# Patient Record
Sex: Female | Born: 1950 | Race: White | Hispanic: No | Marital: Married | Smoking: Never smoker
Health system: Southern US, Community
[De-identification: ages and names within clinical notes are randomized; demographics above are authoritative.]

## PROBLEM LIST (undated history)

## (undated) DIAGNOSIS — I340 Nonrheumatic mitral (valve) insufficiency: Secondary | ICD-10-CM

## (undated) DIAGNOSIS — C801 Malignant (primary) neoplasm, unspecified: Secondary | ICD-10-CM

## (undated) DIAGNOSIS — K219 Gastro-esophageal reflux disease without esophagitis: Secondary | ICD-10-CM

## (undated) DIAGNOSIS — I471 Supraventricular tachycardia, unspecified: Secondary | ICD-10-CM

## (undated) DIAGNOSIS — I10 Essential (primary) hypertension: Secondary | ICD-10-CM

## (undated) DIAGNOSIS — R51 Headache: Secondary | ICD-10-CM

## (undated) DIAGNOSIS — I700 Atherosclerosis of aorta: Secondary | ICD-10-CM

## (undated) DIAGNOSIS — Z9889 Other specified postprocedural states: Secondary | ICD-10-CM

## (undated) DIAGNOSIS — I499 Cardiac arrhythmia, unspecified: Secondary | ICD-10-CM

## (undated) DIAGNOSIS — L900 Lichen sclerosus et atrophicus: Secondary | ICD-10-CM

## (undated) DIAGNOSIS — I2510 Atherosclerotic heart disease of native coronary artery without angina pectoris: Secondary | ICD-10-CM

## (undated) DIAGNOSIS — K449 Diaphragmatic hernia without obstruction or gangrene: Secondary | ICD-10-CM

## (undated) DIAGNOSIS — Z803 Family history of malignant neoplasm of breast: Secondary | ICD-10-CM

## (undated) DIAGNOSIS — L570 Actinic keratosis: Secondary | ICD-10-CM

## (undated) DIAGNOSIS — M1990 Unspecified osteoarthritis, unspecified site: Secondary | ICD-10-CM

## (undated) DIAGNOSIS — I5189 Other ill-defined heart diseases: Secondary | ICD-10-CM

## (undated) DIAGNOSIS — R251 Tremor, unspecified: Secondary | ICD-10-CM

## (undated) DIAGNOSIS — R112 Nausea with vomiting, unspecified: Secondary | ICD-10-CM

## (undated) DIAGNOSIS — R519 Headache, unspecified: Secondary | ICD-10-CM

## (undated) DIAGNOSIS — T8859XA Other complications of anesthesia, initial encounter: Secondary | ICD-10-CM

## (undated) DIAGNOSIS — F39 Unspecified mood [affective] disorder: Secondary | ICD-10-CM

## (undated) DIAGNOSIS — T4145XA Adverse effect of unspecified anesthetic, initial encounter: Secondary | ICD-10-CM

## (undated) DIAGNOSIS — I4719 Other supraventricular tachycardia: Secondary | ICD-10-CM

## (undated) DIAGNOSIS — Z86000 Personal history of in-situ neoplasm of breast: Secondary | ICD-10-CM

## (undated) DIAGNOSIS — C50919 Malignant neoplasm of unspecified site of unspecified female breast: Secondary | ICD-10-CM

## (undated) DIAGNOSIS — G250 Essential tremor: Secondary | ICD-10-CM

## (undated) HISTORY — PX: CHOLECYSTECTOMY: SHX55

## (undated) HISTORY — DX: Supraventricular tachycardia: I47.1

## (undated) HISTORY — DX: Nonrheumatic mitral (valve) insufficiency: I34.0

## (undated) HISTORY — PX: APPENDECTOMY: SHX54

## (undated) HISTORY — DX: Family history of malignant neoplasm of breast: Z80.3

## (undated) HISTORY — DX: Atherosclerotic heart disease of native coronary artery without angina pectoris: I25.10

## (undated) HISTORY — DX: Supraventricular tachycardia, unspecified: I47.10

## (undated) HISTORY — DX: Gastro-esophageal reflux disease without esophagitis: K21.9

## (undated) HISTORY — PX: TONSILLECTOMY: SUR1361

## (undated) HISTORY — DX: Diaphragmatic hernia without obstruction or gangrene: K44.9

## (undated) HISTORY — PX: OVARIAN CYST SURGERY: SHX726

## (undated) HISTORY — DX: Actinic keratosis: L57.0

## (undated) HISTORY — DX: Malignant neoplasm of unspecified site of unspecified female breast: C50.919

## (undated) HISTORY — DX: Essential (primary) hypertension: I10

---

## 1998-12-26 HISTORY — PX: BREAST EXCISIONAL BIOPSY: SUR124

## 2001-12-26 DIAGNOSIS — K449 Diaphragmatic hernia without obstruction or gangrene: Secondary | ICD-10-CM

## 2001-12-26 HISTORY — DX: Diaphragmatic hernia without obstruction or gangrene: K44.9

## 2001-12-26 HISTORY — PX: UPPER GI ENDOSCOPY: SHX6162

## 2006-10-23 ENCOUNTER — Ambulatory Visit: Payer: Self-pay | Admitting: Gynecology

## 2006-10-23 ENCOUNTER — Encounter (INDEPENDENT_AMBULATORY_CARE_PROVIDER_SITE_OTHER): Payer: Self-pay | Admitting: Gynecology

## 2006-11-06 ENCOUNTER — Ambulatory Visit: Payer: Self-pay | Admitting: Gynecology

## 2007-01-04 ENCOUNTER — Ambulatory Visit (HOSPITAL_COMMUNITY): Admission: RE | Admit: 2007-01-04 | Discharge: 2007-01-04 | Payer: Self-pay | Admitting: Gynecology

## 2007-01-08 ENCOUNTER — Ambulatory Visit: Payer: Self-pay | Admitting: Gynecology

## 2007-01-16 ENCOUNTER — Encounter: Admission: RE | Admit: 2007-01-16 | Discharge: 2007-01-16 | Payer: Self-pay | Admitting: Gynecology

## 2010-12-06 ENCOUNTER — Ambulatory Visit: Payer: Self-pay | Admitting: Family Medicine

## 2010-12-06 ENCOUNTER — Encounter: Payer: Self-pay | Admitting: Family Medicine

## 2010-12-06 DIAGNOSIS — M25559 Pain in unspecified hip: Secondary | ICD-10-CM

## 2010-12-06 DIAGNOSIS — R252 Cramp and spasm: Secondary | ICD-10-CM

## 2010-12-08 ENCOUNTER — Ambulatory Visit: Payer: Self-pay | Admitting: Family Medicine

## 2010-12-09 ENCOUNTER — Ambulatory Visit: Payer: Self-pay | Admitting: Family Medicine

## 2010-12-09 LAB — CONVERTED CEMR LAB
BUN: 15 mg/dL (ref 6–23)
Cholesterol: 187 mg/dL (ref 0–200)
Creatinine, Ser: 0.8 mg/dL (ref 0.4–1.2)
GFR calc non Af Amer: 82.41 mL/min (ref >60.00)
LDL Cholesterol: 101 mg/dL — ABNORMAL HIGH (ref 0–99)
TSH: 1.98 microintl units/mL (ref 0.35–5.50)
Triglycerides: 43 mg/dL (ref 0.0–149.0)
VLDL: 8.6 mg/dL (ref 0.0–40.0)

## 2010-12-10 ENCOUNTER — Encounter: Payer: Self-pay | Admitting: Family Medicine

## 2010-12-13 ENCOUNTER — Encounter: Payer: Self-pay | Admitting: Family Medicine

## 2010-12-13 ENCOUNTER — Ambulatory Visit: Payer: Self-pay | Admitting: Family Medicine

## 2011-01-27 NOTE — Miscellaneous (Signed)
Summary: BONE DENSITY  Clinical Lists Changes  Orders: Added new Test order of T-Bone Densitometry (77080) - Signed Added new Test order of T-Lumbar Vertebral Assessment (77082) - Signed 

## 2011-01-27 NOTE — Letter (Signed)
Summary: Results Follow up Letter  [place] at [facility]  [address] [place], [place] [address]   Phone: [phone]  Fax: [phone]    12/10/2010 MRN: [id number]  Susan [name] [address] [place], [place]  [address]  Dear [name],  The following are the results of your recent test(s):  Test         Result    Pap Smear:        Normal _____  Not Normal _____ Comments: ______________________________________________________ Cholesterol: LDL(Bad cholesterol):         Your goal is less than:         HDL (Good cholesterol):       Your goal is more than: Comments:  ______________________________________________________ Mammogram:        Normal _____  Not Normal _____ Comments:  ___________________________________________________________________ Hemoccult:        Normal __X___  Not normal _______ Comments:    _____________________________________________________________________ Other Tests:    We routinely do not discuss normal results over the telephone.  If you desire a copy of the results, or you have any questions about this information we can discuss them at your next office visit.   Sincerely,      Dr. Ruthe Mannan

## 2011-01-27 NOTE — Letter (Signed)
Summary: [facility]: Immunoassay Fecal Occult Blood (iFOB) Order Form  [place] at [facility]  [address] [place], [place] [address]   Phone: [phone]  Fax: [phone]      [facility]: Immunoassay Fecal Occult Blood (iFOB) Order Form   December 06, 2010 MRN: [id number]   Krishna Degollado 02-09-1951   Physicican Name:________Talia Aron_________________  Diagnosis Code:_________792.1_________________      Ruthe Mannan MD

## 2011-01-27 NOTE — Assessment & Plan Note (Signed)
Summary: NEW PT TO EST/CLE   Vital Signs:  Patient profile:   60 year old female Height:      68 inches Weight:      143.3 pounds BMI:     21.87 Temp:     97.4 degrees F oral Pulse rate:   76 / minute Pulse rhythm:   regular BP sitting:   110 / 76  (left arm) Cuff size:   regular  Vitals Entered By: Benny Lennert CMA Duncan Dull) (December 06, 2010 10:55 AM)  History of Present Illness: 60 yo here to establish care.  Right hip pain- occurred fist time this past summer, was walking and felt like right hip was "catching." excruciating pain, no radiation, resolved after she rested for a few minutes.  Occurred again two weeks ago but this time was so bad she could not walk.  Again, pain did not radiate.  No GI or GU symptoms. 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  No other known OA. Very physically active, goes to the gym most days of the week. Has not had a bone density scan in years. Does take Caltrate twice daily.  Muscle cramps- occurs mainly at night.  Recently lost 20 pounds on [employer].  Does not each much fruit.  No LE weakness or swelling.  Preventive Screening-Counseling & Management  Alcohol-Tobacco     Smoking Status: never  Caffeine-Diet-Exercise     Does Patient Exercise: yes      Drug Use:  no.    Current Medications (verified): 1)  Liquid Calcium With D3 [phone] Mg-Unit Caps (Calcium Carb-Cholecalciferol) .... 2 Tablet Daily 2)  Vitamin D3 1000 Unit Tabs (Cholecalciferol) .... One Tablet Dialy 3)  Multivitamins  Caps (Multiple Vitamin) .... One Tablet Dialy 4)  Magnesium 500 Mg Tabs (Magnesium) .... 2 Tabs Two Times A Day 5)  Fiber 625 Mg Tabs (Calcium Polycarbophil) .... Two Tabs Two Times A Day 6)  Fish Oil 1200 Mg Caps (Omega-3 Fatty Acids) .... 2 Tabs Once Daily 7)  Xanax 0.5 Mg Tabs (Alprazolam) .Marland Kitchen.. 1 Tablet Daily As Needed  Allergies (verified): No Known Drug Allergies  Past History:  Family History: Last updated: 12/06/2010 Mom- alive, HTN, HDL Dad died  of CVA, HTN  Social History: Last updated: 12/06/2010 Married Regular exercise-yes Never Smoked Alcohol use-yes Drug use-no  Risk Factors: Exercise: yes (12/06/2010)  Risk Factors: Smoking Status: never (12/06/2010)  Past Medical History: G0P0- post menopausal (2000)- [facility] Lichen sclerosis  Past Surgical History: Appendectomy Cholecystectomy  Family History: Mom- alive, HTN, HDL Dad died of CVA, HTN  Social History: Married Regular exercise-yes Never Smoked Alcohol use-yes Drug use-no Does Patient Exercise:  yes Smoking Status:  never Drug Use:  no  Review of Systems      See HPI General:  Denies malaise. Eyes:  Denies blurring. ENT:  Denies difficulty swallowing. CV:  Denies chest pain or discomfort. Resp:  Denies shortness of breath. GI:  Denies abdominal pain, bloody stools, and change in bowel habits. GU:  Denies abnormal vaginal bleeding, discharge, urinary frequency, and urinary hesitancy. MS:  Complains of joint pain; denies joint redness, joint swelling, loss of strength, muscle aches, muscle weakness, and stiffness. Derm:  Denies rash. Neuro:  Denies poor balance. Psych:  Denies anxiety and depression. Endo:  Denies cold intolerance and heat intolerance. Heme:  Denies abnormal bruising and bleeding.  Physical Exam  General:  alert, well-developed, and well-nourished.   Head:  normocephalic and atraumatic.   Eyes:  vision grossly  intact, pupils equal, pupils round, and pupils reactive to light.   Ears:  R ear normal and L ear normal.   Nose:  no external deformity.   Mouth:  good dentition.   Neck:  No deformities, masses, or tenderness noted. Lungs:  Normal respiratory effort, chest expands symmetrically. Lungs are clear to auscultation, no crackles or wheezes. Heart:  Normal rate and regular rhythm. S1 and S2 normal without gallop, murmur, click, rub or other extra sounds. Abdomen:  Bowel sounds positive,abdomen soft and non-tender  without masses, organomegaly or hernias noted. Extremities:  no edema Neurologic:  alert & oriented X3 and gait normal.   Skin:  Intact without suspicious lesions or rashes Cervical Nodes:  No lymphadenopathy noted Psych:  Oriented X3 and good eye contact.     Hip Exam  Hip Exam:    Right:    Inspection:  Normal    Palpation:  Normal    Stability:  stable    Tenderness:  no    Swelling:  no    Erythema:  no    Range of Motion:       Flexion-Active: 120       Extension-Active: 30       Internal Rotation-Active: 45       External Rotation-Active: 45       Flexion-Passive: 120       Extension-Passive: 30       Internal Rotation-Passive: 45       External Rotation: 45    Left:    Inspection:  Normal    Palpation:  Normal    Stability:  stable    Tenderness:  no    Swelling:  no    Erythema:  no    Range of Motion:       Flexion-Active: 120       Extension-Active: 30       Internal Rotation-Active: 45       External Rotation-Active: 45       Flexion-Passive: 120       Extension-Passive: 30       Internal Rotation-Passive: 45       External Rotation: 45   Impression & Recommendations:  Problem # 1:  HIP PAIN, RIGHT (ICD-719.45) Assessment New ? OA.  Will get xray to view alignment and joint space. Orders: T-Hip Comp Right Min 2 views (73510TC)  Problem # 2:  R/O OSTEOPENIA (ICD-733.90) Assessment: New Given #1 along with her age and risk factors, DEXA scan is appropriate. Her updated medication list for this problem includes:    Liquid Calcium With D3 [phone] Mg-unit Caps (Calcium carb-cholecalciferol) .Marland Kitchen... 2 tablet daily    Vitamin D3 1000 Unit Tabs (Cholecalciferol) ..... One tablet dialy  Orders: Radiology Referral (Radiology)  Problem # 3:  MUSCLE CRAMPS (ICD-729.82) Assessment: New Concern that she likely has electrolyte imbalances due to her diet. Check BMET.  Complete Medication List: 1)  Liquid Calcium With D3 [phone] Mg-unit Caps (Calcium  carb-cholecalciferol) .... 2 tablet daily 2)  Vitamin D3 1000 Unit Tabs (Cholecalciferol) .... One tablet dialy 3)  Multivitamins Caps (Multiple vitamin) .... One tablet dialy 4)  Magnesium 500 Mg Tabs (Magnesium) .... 2 tabs two times a day 5)  Fiber 625 Mg Tabs (Calcium polycarbophil) .... Two tabs two times a day 6)  Fish Oil 1200 Mg Caps (Omega-3 fatty acids) .... 2 tabs once daily 7)  Xanax 0.5 Mg Tabs (Alprazolam) .Marland Kitchen.. 1 tablet daily as needed  Patient Instructions: 1)  Great to  meet you. 2)  Please stop by to see Shirlee Limerick on your way out to set up your Bone Density scan. 3)  At your convenience, come back for lab work: 4)  BMET (v70.0), lipid panel (v81.0), TSH    Orders Added: 1)  T-Hip Comp Right Min 2 views [73510TC] 2)  Radiology Referral [Radiology] 3)  New Patient Level IV [[address]]    Current Allergies (reviewed today): No known allergies   TD Result Date:  12/03/2008 TD Result:  historical Herpes Zoster Next Due:  Refused Colonoscopy Result Date:  12/19/2001 Colonoscopy Result:  historical, normal Colonoscopy Next Due:  10 yr PAP Result Date:  10/09/2009 PAP Result:  historical, normal PAP Next Due:  3 yr Mammogram Result Date:  01/07/2010 Mammogram Result:  historical    Past Medical History:    G0P0- post menopausal (2000)- Shirlyn Goltz    Lichen sclerosis  Past Surgical History:    Appendectomy    Cholecystectomy

## 2011-02-16 ENCOUNTER — Encounter: Payer: Self-pay | Admitting: Family Medicine

## 2011-02-16 ENCOUNTER — Ambulatory Visit (INDEPENDENT_AMBULATORY_CARE_PROVIDER_SITE_OTHER): Payer: BC Managed Care – PPO | Admitting: Family Medicine

## 2011-02-16 DIAGNOSIS — M948X9 Other specified disorders of cartilage, unspecified sites: Secondary | ICD-10-CM

## 2011-02-22 NOTE — Assessment & Plan Note (Signed)
Summary: UPPER RIGHT BACK PAIN/CLE  BLUE CROSS BLUE OPTIONS   Vital Signs:  Patient profile:   60 year old Susan [name] Height:      68 inches Weight:      142.50 pounds BMI:     21.75 Temp:     98.7 degrees F oral Pulse rate:   68 / minute Pulse rhythm:   regular BP sitting:   120 / 86  (left arm) Cuff size:   regular  Vitals Entered By: Linde Gillis CMA (AAMA) (February 16, 2011 8:00 AM) CC: back pain   History of Present Illness: 60 yo Susan [name] here with acute onset of right upper back pain.  Woke up Sunday morning with a soreness of her right scapular region. Thought it was GERD, took Pepcid and Gas X which did not help. Later that day, pain worsened with sudden movements- became sharp if she turned a certain way. No pain if she just stood still. Pain can be reproduced by lifting arms over her head.  No SOB, CP, diaphoresis or nausea.  Pt does exercise at the gym but has not tried any new machines or movements.  Does have h/o osteopenia on DEXA from 11/2010. Taking Calcium and Vit D.  Non smoker.  Current Medications (verified): 1)  Liquid Calcium With D3 [phone] Mg-Unit Caps (Calcium Carb-Cholecalciferol) .... 2 Tablet Daily 2)  Vitamin D3 1000 Unit Tabs (Cholecalciferol) .... One Tablet Dialy 3)  Multivitamins  Caps (Multiple Vitamin) .... One Tablet Dialy 4)  Magnesium 500 Mg Tabs (Magnesium) .... 2 Tabs Two Times A Day 5)  Fiber 625 Mg Tabs (Calcium Polycarbophil) .... Two Tabs Two Times A Day 6)  Fish Oil 1200 Mg Caps (Omega-3 Fatty Acids) .... 2 Tabs Once Daily 7)  Xanax 0.5 Mg Tabs (Alprazolam) .Marland Kitchen.. 1 Tablet Daily As Needed 8)  [facility]vs Acid Reducer 10 Mg Tabs (Famotidine) .... Take As Directed 9)  Gas Relief 80 Mg Chew (Simethicone) .... Take As Directed 10)  Cyclobenzaprine Hcl 10 Mg  Tabs (Cyclobenzaprine Hcl) .Marland Kitchen.. 1 By Mouth 2 Times Daily As Needed For Back Pain  Allergies (verified): No Known Drug Allergies  Past History:  Past Medical History: Last updated:  12/06/2010 G0P0- post menopausal (2000)- [facility] Lichen sclerosis  Past Surgical History: Last updated: 12/06/2010 Appendectomy Cholecystectomy  Family History: Last updated: 12/06/2010 Mom- alive, HTN, HDL Dad died of CVA, HTN  Social History: Last updated: 12/06/2010 Married Regular exercise-yes Never Smoked Alcohol use-yes Drug use-no  Risk Factors: Exercise: yes (12/06/2010)  Risk Factors: Smoking Status: never (12/06/2010)  Review of Systems      See HPI General:  Denies fever. GI:  Denies abdominal pain, nausea, and vomiting. MS:  Denies muscle weakness. Derm:  Denies rash. Neuro:  Denies headaches. Psych:  Denies anxiety and depression.  Physical Exam  General:  alert, well-developed, and well-nourished.   Lungs:  Normal respiratory effort, chest expands symmetrically. Lungs are clear to auscultation, no crackles or wheezes. Heart:  Normal rate and regular rhythm. S1 and S2 normal without gallop, murmur, click, rub or other extra sounds. Msk:  mildy TTP over C4 Extremities:  no edema Neurologic:  alert & oriented X3 and gait normal.   Psych:  Oriented X3 and good eye contact.     Shoulder/Elbow Exam  Shoulder Exam:    Right:    Inspection:  Normal    Palpation:  Normal    Stability:  stable    Tenderness:  no    Swelling:  no    Erythema:  no    pos empty can, pos arch    Range of Motion:       Flexion-Active: 180       Extension-Active: 45       Flexion-Passive: 180       Extension-Passive: 45       External Rotation : 45       Interior Rotation : T7    Left:    Inspection:  Normal    Palpation:  Normal    Stability:  stable    Tenderness:  no    Swelling:  no    Erythema:  no    Range of Motion:       Flexion-Active: 180       Extension-Active: 45       Flexion-Passive: 180       Extension-Passive: 45       External Rotation : 45       Interior Rotation : T7   Impression & Recommendations:  Problem # 1:  OTHER  DISORDERS OF BONE AND CARTILAGE OTHER (ICD-733.99) Assessment New Differential is wide but given history and physical exam findings, seems most consistent with nerve impingement. Pt has h/o osteopenia and some tenderness of Cspine so I question possible vertebral fracture causing nerve impingement. She reports pain is somewhat improved. has a high deductible and does not want further work up at this time. I would like to do an xray to rule out vertebral fracture and any lung pathology (unlikely) causing referred pain. If pain persists, pt will call to set up an xray.  Her updated medication list for this problem includes:    Liquid Calcium With D3 [phone] Mg-unit Caps (Calcium carb-cholecalciferol) .Marland Kitchen... 2 tablet daily    Vitamin D3 1000 Unit Tabs (Cholecalciferol) ..... One tablet dialy  Complete Medication List: 1)  Liquid Calcium With D3 [phone] Mg-unit Caps (Calcium carb-cholecalciferol) .... 2 tablet daily 2)  Vitamin D3 1000 Unit Tabs (Cholecalciferol) .... One tablet dialy 3)  Multivitamins Caps (Multiple vitamin) .... One tablet dialy 4)  Magnesium 500 Mg Tabs (Magnesium) .... 2 tabs two times a day 5)  Fiber 625 Mg Tabs (Calcium polycarbophil) .... Two tabs two times a day 6)  Fish Oil 1200 Mg Caps (Omega-3 fatty acids) .... 2 tabs once daily 7)  Xanax 0.5 Mg Tabs (Alprazolam) .Marland Kitchen.. 1 tablet daily as needed 8)  Cvs Acid Reducer 10 Mg Tabs (Famotidine) .... Take as directed 9)  Gas Relief 80 Mg Chew (Simethicone) .... Take as directed 10)  Cyclobenzaprine Hcl 10 Mg Tabs (Cyclobenzaprine hcl) .Marland Kitchen.. 1 by mouth 2 times daily as needed for back pain  Patient Instructions: 1)  lets increase your vitamin d to 2000 International Units daily, continue with your calcium. 2)  call me in a few days to let me know how you're feeling. Prescriptions: CYCLOBENZAPRINE HCL 10 MG  TABS (CYCLOBENZAPRINE HCL) 1 by mouth 2 times daily as needed for back pain  #30 x 0   Entered and Authorized by:    Ruthe Mannan MD   Signed by:   Ruthe Mannan MD on 02/16/2011   Method used:   Electronically to        [id number] (retail)       [address] [place], [place]  [address]       Ph: [phone]  Fax: [phone]   RxID:   [id number]    Orders Added: 1)  Est. Patient Level IV [46962]    Current Allergies (reviewed today): No known allergies

## 2012-02-16 ENCOUNTER — Other Ambulatory Visit: Payer: Self-pay | Admitting: Obstetrics and Gynecology

## 2012-02-16 DIAGNOSIS — Z1231 Encounter for screening mammogram for malignant neoplasm of breast: Secondary | ICD-10-CM

## 2012-02-27 ENCOUNTER — Ambulatory Visit
Admission: RE | Admit: 2012-02-27 | Discharge: 2012-02-27 | Disposition: A | Discharge status: Home / Self Care | Payer: BC Managed Care – PPO | Source: Ambulatory Visit | Attending: Obstetrics and Gynecology | Admitting: Obstetrics and Gynecology

## 2012-02-27 DIAGNOSIS — Z1231 Encounter for screening mammogram for malignant neoplasm of breast: Secondary | ICD-10-CM

## 2013-03-21 ENCOUNTER — Other Ambulatory Visit: Payer: Self-pay | Admitting: Family Medicine

## 2013-03-21 DIAGNOSIS — Z0000 Encounter for general adult medical examination without abnormal findings: Secondary | ICD-10-CM | POA: Insufficient documentation

## 2013-03-21 DIAGNOSIS — Z136 Encounter for screening for cardiovascular disorders: Secondary | ICD-10-CM

## 2013-03-26 ENCOUNTER — Other Ambulatory Visit (INDEPENDENT_AMBULATORY_CARE_PROVIDER_SITE_OTHER): Payer: BC Managed Care – PPO

## 2013-03-26 DIAGNOSIS — Z136 Encounter for screening for cardiovascular disorders: Secondary | ICD-10-CM

## 2013-03-26 DIAGNOSIS — Z0000 Encounter for general adult medical examination without abnormal findings: Secondary | ICD-10-CM

## 2013-03-26 LAB — LIPID PANEL
Total CHOL/HDL Ratio: 2
Triglycerides: 48 mg/dL (ref 0.0–149.0)

## 2013-03-26 LAB — COMPREHENSIVE METABOLIC PANEL
AST: 20 U/L (ref 0–37)
Albumin: 4.3 g/dL (ref 3.5–5.2)
BUN: 17 mg/dL (ref 6–23)
Calcium: 9.3 mg/dL (ref 8.4–10.5)
Chloride: 101 mEq/L (ref 96–112)
Potassium: 4.1 mEq/L (ref 3.5–5.1)
Total Protein: 6.9 g/dL (ref 6.0–8.3)

## 2013-04-02 ENCOUNTER — Ambulatory Visit (INDEPENDENT_AMBULATORY_CARE_PROVIDER_SITE_OTHER): Payer: BC Managed Care – PPO | Admitting: Family Medicine

## 2013-04-02 ENCOUNTER — Encounter: Payer: Self-pay | Admitting: Family Medicine

## 2013-04-02 VITALS — BP 130/98 | HR 68 | Temp 97.5°F | Ht 68.0 in | Wt 147.0 lb

## 2013-04-02 DIAGNOSIS — Z1211 Encounter for screening for malignant neoplasm of colon: Secondary | ICD-10-CM

## 2013-04-02 DIAGNOSIS — Z0000 Encounter for general adult medical examination without abnormal findings: Secondary | ICD-10-CM

## 2013-04-02 NOTE — Progress Notes (Signed)
Subjective:    Patient ID: Susan [name], female    DOB: 1951-12-09, 62 y.o.   MRN: [id number]  HPI  G0P0 who has not been seen here in several years, here for CPX. She has been doing well.  Remains quite active and has no complaints.  [facility]  Mammogram normal last month.  Has never had a colonoscopy.  No family history of colon CA.  Lab Results  Component Value Date   CHOL 173 03/26/2013   HDL 70.20 03/26/2013   LDLCALC 93 03/26/2013   TRIG 48.0 03/26/2013   CHOLHDL 2 03/26/2013   Lab Results  Component Value Date   NA 137 03/26/2013   K 4.1 03/26/2013   CL 101 03/26/2013   CO2 27 03/26/2013   Patient Active Problem List  Diagnosis  . HIP PAIN, RIGHT  . MUSCLE CRAMPS  . OTHER DISORDERS OF BONE AND CARTILAGE OTHER  . Routine general medical examination at a health care facility   No past medical history on file. Past Surgical History  Procedure Laterality Date  . Appendectomy    . Cholecystectomy     History  Substance Use Topics  . Smoking status: Not on file  . Smokeless tobacco: Not on file  . Alcohol Use: Not on file   Family History  Problem Relation Age of Onset  . Hyperlipidemia Mother   . Hypertension Mother   . Stroke Father   . Hypertension Father    Allergies not on file No current outpatient prescriptions on file prior to visit.   No current facility-administered medications on file prior to visit.   The PMH, PSH, Social History, Family History, Medications, and allergies have been reviewed in [facility], and have been updated if relevant.   Review of Systems   See HPI Patient reports no  vision/ hearing changes,anorexia, weight change, fever ,adenopathy, persistant / recurrent hoarseness, swallowing issues, chest pain, edema,persistant / recurrent cough, hemoptysis, dyspnea(rest, exertional, paroxysmal nocturnal), gastrointestinal  bleeding (melena, rectal bleeding), abdominal pain, excessive heart burn, GU symptoms(dysuria, hematuria,  pyuria, voiding/incontinence  Issues) syncope, focal weakness, severe memory loss, concerning skin lesions, depression, anxiety, abnormal bruising/bleeding, major joint swelling, breast masses or abnormal vaginal bleeding.    Objective:   Physical Exam BP 130/98  Pulse 68  Temp(Src) 97.5 F (36.4 C)  Ht 5\' 8"  (1.727 m)  Wt 147 lb (66.679 kg)  BMI 22.36 kg/m2  General:  Well-developed,well-nourished,in no acute distress; alert,appropriate and cooperative throughout examination Head:  normocephalic and atraumatic.   Eyes:  vision grossly intact, pupils equal, pupils round, and pupils reactive to light.   Ears:  R ear normal and L ear normal.   Nose:  no external deformity.   Mouth:  good dentition.   Neck:  No deformities, masses, or tenderness noted. Lungs:  Normal respiratory effort, chest expands symmetrically. Lungs are clear to auscultation, no crackles or wheezes. Heart:  Normal rate and regular rhythm. S1 and S2 normal without gallop, murmur, click, rub or other extra sounds. Abdomen:  Bowel sounds positive,abdomen soft and non-tender without masses, organomegaly or hernias noted. Rectal:  no external abnormalities.   Msk:  No deformity or scoliosis noted of thoracic or lumbar spine.   Extremities:  No clubbing, cyanosis, edema, or deformity noted with normal full range of motion of all joints.   Neurologic:  alert & oriented X3 and gait normal.   Skin:  Intact without suspicious lesions or rashes Cervical Nodes:  No lymphadenopathy noted Axillary Nodes:  No palpable lymphadenopathy Psych:  Cognition and judgment appear intact. Alert and cooperative with normal attention span and concentration. No apparent delusions, illusions, hallucinations      Assessment & Plan:  1. Routine general medical examination at a health care facility Reviewed preventive care protocols, scheduled due services, and updated immunizations Discussed nutrition, exercise, diet, and healthy  lifestyle. She will call insurance about shingles vaccine coverage.  2. Special screening for malignant neoplasms, colon She would like to call insurance about colonoscopy.  IFOB ordered in interim. - Fecal occult blood, imunochemical; Future

## 2013-04-02 NOTE — Patient Instructions (Addendum)
Good to see you. Have a wonderful trip this summer.  Please call [employer] [facility] to ask about your colonoscopy about the shingles vaccine.  Please stop by the lab to pick up your stool cards.

## 2013-04-12 ENCOUNTER — Other Ambulatory Visit (INDEPENDENT_AMBULATORY_CARE_PROVIDER_SITE_OTHER): Payer: BC Managed Care – PPO

## 2013-04-12 DIAGNOSIS — Z1211 Encounter for screening for malignant neoplasm of colon: Secondary | ICD-10-CM

## 2013-04-12 LAB — FECAL OCCULT BLOOD, IMMUNOCHEMICAL: Fecal Occult Bld: POSITIVE

## 2013-04-15 ENCOUNTER — Encounter: Payer: Self-pay | Admitting: *Deleted

## 2013-04-15 ENCOUNTER — Encounter: Payer: Self-pay | Admitting: Family Medicine

## 2013-04-15 LAB — FECAL OCCULT BLOOD, GUAIAC: Fecal Occult Blood: NEGATIVE

## 2013-09-26 ENCOUNTER — Telehealth: Payer: Self-pay | Admitting: *Deleted

## 2013-09-26 NOTE — Telephone Encounter (Signed)
She can try dry mouth tooth pastes and products like biotene and if does not improve, needs to be seen to rule out other possible contributing factors.

## 2013-09-26 NOTE — Telephone Encounter (Signed)
Pt states she has already tried these products for dry mouth.  Appt scheduled for next week.

## 2013-09-26 NOTE — Telephone Encounter (Signed)
Pt left VM at triage stating she has been bother with dry mouth for 4 weeks now and would like to know what to do. Please advise

## 2013-10-02 ENCOUNTER — Ambulatory Visit (INDEPENDENT_AMBULATORY_CARE_PROVIDER_SITE_OTHER): Payer: BC Managed Care – PPO | Admitting: Family Medicine

## 2013-10-02 ENCOUNTER — Encounter: Payer: Self-pay | Admitting: Family Medicine

## 2013-10-02 VITALS — BP 122/88 | HR 72 | Temp 97.9°F | Wt 143.2 lb

## 2013-10-02 DIAGNOSIS — Z23 Encounter for immunization: Secondary | ICD-10-CM

## 2013-10-02 DIAGNOSIS — R682 Dry mouth, unspecified: Secondary | ICD-10-CM

## 2013-10-02 DIAGNOSIS — K117 Disturbances of salivary secretion: Secondary | ICD-10-CM

## 2013-10-02 LAB — BASIC METABOLIC PANEL
CO2: 28 mEq/L (ref 19–32)
Calcium: 9.9 mg/dL (ref 8.4–10.5)
Chloride: 103 mEq/L (ref 96–112)
Creatinine, Ser: 0.8 mg/dL (ref 0.4–1.2)
GFR: 81.65 mL/min (ref >60.00)
Glucose, Bld: 95 mg/dL (ref 70–99)
Sodium: 141 mEq/L (ref 135–145)

## 2013-10-02 LAB — CBC WITH DIFFERENTIAL/PLATELET
Basophils Absolute: 0.1 10*3/uL (ref 0.0–0.1)
Eosinophils Relative: 1.4 % (ref 0.0–5.0)
HCT: 44.2 % (ref 36.0–46.0)
Lymphocytes Relative: 18.4 % (ref 12.0–46.0)
Lymphs Abs: 1.2 10*3/uL (ref 0.7–4.0)
Monocytes Relative: 7.9 % (ref 3.0–12.0)
Neutrophils Relative %: 71.4 % (ref 43.0–77.0)
Platelets: 281 10*3/uL (ref 150.0–400.0)
RDW: 13.4 % (ref 11.5–14.6)
WBC: 6.8 10*3/uL (ref 4.5–10.5)

## 2013-10-02 LAB — HEMOGLOBIN A1C: Hgb A1c MFr Bld: 5.7 % (ref 4.6–6.5)

## 2013-10-02 LAB — TSH: TSH: 1.65 u[IU]/mL (ref 0.35–5.50)

## 2013-10-02 NOTE — Addendum Note (Signed)
Addended by: Criselda Peaches B on: 10/02/2013 09:51 AM   Modules accepted: Orders

## 2013-10-02 NOTE — Patient Instructions (Signed)
Good to see you. We will call you with your lab results, likely toward the end of the week.

## 2013-10-02 NOTE — Progress Notes (Signed)
Subjective:    Patient ID: Susan [name], female    DOB: 11-28-1951, 62 y.o.   MRN: [id number]  HPI  Very pleasant 62 yo female here for dry mouth.  Started over a month ago.  Noticed she would wake up in the middle of the night with a dry "film" in her mouth.  She thought this was strange since she does not sleep with her mouth open.  It has since progressed, still waking her up in the middle of the night.  She also has noticed that she needs liquids to swallow dry foods and typically had no issues with this.  No issues with dry eyes.  Dad had Diabetes insipidus but she does not feel like she is more thirsty.  She drinks more just to "coat her mouth."  She tried to cut back on certain things to see if it would help- eliminated wine and fiber supplements.  Trying biotene products as well. Only very mild improvement.  Patient Active Problem List   Diagnosis Date Noted  . Dry mouth 10/02/2013  . Routine general medical examination at a health care facility 03/21/2013  . OTHER DISORDERS OF BONE AND CARTILAGE OTHER 02/16/2011  . HIP PAIN, RIGHT 12/06/2010  . MUSCLE CRAMPS 12/06/2010   No past medical history on file. Past Surgical History  Procedure Laterality Date  . Appendectomy    . Cholecystectomy     History  Substance Use Topics  . Smoking status: Never Smoker   . Smokeless tobacco: Not on file  . Alcohol Use: Not on file   Family History  Problem Relation Age of Onset  . Hyperlipidemia Mother   . Hypertension Mother   . Stroke Father   . Hypertension Father    No Known Allergies Current Outpatient Prescriptions on File Prior to Visit  Medication Sig Dispense Refill  . ALPRAZolam (XANAX) 0.5 MG tablet Take 0.5 mg by mouth at bedtime as needed for sleep.      . Clobetasol Prop Oint-Coal Tar (CLOBETA OINTMENT EX) Apply topically twice a week      . conjugated estrogens (PREMARIN) vaginal cream Place vaginally daily. Two times a week       No current  facility-administered medications on file prior to visit.   The PMH, PSH, Social History, Family History, Medications, and allergies have been reviewed in [facility], and have been updated if relevant.   Review of Systems See HPI No changes in bowel habits other than some constipation which she thinks happened because she stopped her fiber supplements Weight stable    Objective:   Physical Exam BP 122/88  Pulse 72  Temp(Src) 97.9 F (36.6 C) (Oral)  Wt 143 lb 4 oz (64.978 kg)  BMI 21.79 kg/m2  SpO2 96% Wt Readings from Last 3 Encounters:  10/02/13 143 lb 4 oz (64.978 kg)  04/02/13 147 lb (66.679 kg)  02/16/11 142 lb 8 oz (64.638 kg)   General:  Well-developed,well-nourished,in no acute distress; alert,appropriate and cooperative throughout examination Head:  normocephalic and atraumatic.   Eyes:  vision grossly intact, pupils equal, pupils round, and pupils reactive to light.   Mouth:  good dentition.   Neck:  No deformities, masses, or tenderness noted. Msk:  No deformity or scoliosis noted of thoracic or lumbar spine.   Extremities:  No clubbing, cyanosis, edema, or deformity noted with normal full range of motion of all joints.   Neurologic:  alert & oriented X3 and gait normal.   Skin:  Intact without suspicious  lesions or rashes Psych:  Cognition and judgment appear intact. Alert and cooperative with normal attention span and concentration. No apparent delusions, illusions, hallucinations     Assessment & Plan:  1. Dry mouth ? Possible Sjorgrens syndrome.  Also would like to rule out thyroid, blood sugar, electrolyte and other possible causes. Check CBC as well. The patient indicates understanding of these issues and agrees with the plan.  - ANA - Sjogrens syndrome-A extractable nuclear antibody - TSH - CBC with Differential - Magnesium - Basic Metabolic Panel - Hemoglobin A1c

## 2013-10-03 ENCOUNTER — Other Ambulatory Visit: Payer: Self-pay | Admitting: Family Medicine

## 2013-10-03 DIAGNOSIS — E875 Hyperkalemia: Secondary | ICD-10-CM

## 2013-10-03 LAB — SJOGRENS SYNDROME-A EXTRACTABLE NUCLEAR ANTIBODY: SSA (Ro) (ENA) Antibody, IgG: 2 AU/mL (ref <30)

## 2013-10-07 ENCOUNTER — Other Ambulatory Visit (INDEPENDENT_AMBULATORY_CARE_PROVIDER_SITE_OTHER): Payer: BC Managed Care – PPO

## 2013-10-07 DIAGNOSIS — E875 Hyperkalemia: Secondary | ICD-10-CM

## 2013-10-07 LAB — BASIC METABOLIC PANEL
BUN: 16 mg/dL (ref 6–23)
CO2: 25 mEq/L (ref 19–32)
Chloride: 101 mEq/L (ref 96–112)
GFR: 81.64 mL/min (ref >60.00)
Glucose, Bld: 106 mg/dL — ABNORMAL HIGH (ref 70–99)
Potassium: 4.5 mEq/L (ref 3.5–5.1)
Sodium: 137 mEq/L (ref 135–145)

## 2014-01-27 ENCOUNTER — Ambulatory Visit (INDEPENDENT_AMBULATORY_CARE_PROVIDER_SITE_OTHER): Payer: BC Managed Care – PPO | Admitting: Family Medicine

## 2014-01-27 ENCOUNTER — Encounter: Payer: Self-pay | Admitting: Family Medicine

## 2014-01-27 VITALS — BP 122/76 | HR 60 | Temp 97.7°F | Ht 67.75 in | Wt 149.0 lb

## 2014-01-27 DIAGNOSIS — M67919 Unspecified disorder of synovium and tendon, unspecified shoulder: Secondary | ICD-10-CM

## 2014-01-27 DIAGNOSIS — M7542 Impingement syndrome of left shoulder: Secondary | ICD-10-CM

## 2014-01-27 DIAGNOSIS — M719 Bursopathy, unspecified: Secondary | ICD-10-CM

## 2014-01-27 MED ORDER — PREDNISONE 10 MG PO TABS: ORAL_TABLET | ORAL | Status: DC

## 2014-01-27 NOTE — Progress Notes (Signed)
   Subjective:    Patient ID: Susan [name], female    DOB: 1951/02/22, 63 y.o.   MRN: [id number]  HPI  Very pleasant 63 yo female with several (2-3 weeks) of left arm pain/tingling.  No known injury although she did think she initially felt these symptoms in October after her flu shot but it resolved.  Feels like a tooth ache and tingling radiates down into her fingers (she cannot remember if all are affected).  She does have h/o DDD in her neck.  No other known injury.  No UE weakness.  Ibuprofen was helping but no longer taking away the pain- just dulling it.  Patient Active Problem List   Diagnosis Date Noted  . Dry mouth 10/02/2013  . Routine general medical examination at a health care facility 03/21/2013  . OTHER DISORDERS OF BONE AND CARTILAGE OTHER 02/16/2011  . HIP PAIN, RIGHT 12/06/2010  . MUSCLE CRAMPS 12/06/2010   No past medical history on file. Past Surgical History  Procedure Laterality Date  . Appendectomy    . Cholecystectomy     History  Substance Use Topics  . Smoking status: Never Smoker   . Smokeless tobacco: Not on file  . Alcohol Use: Not on file   Family History  Problem Relation Age of Onset  . Hyperlipidemia Mother   . Hypertension Mother   . Stroke Father   . Hypertension Father    No Known Allergies Current Outpatient Prescriptions on File Prior to Visit  Medication Sig Dispense Refill  . ALPRAZolam (XANAX) 0.5 MG tablet Take 0.5 mg by mouth at bedtime as needed for sleep.      . Calcium Carbonate-Vitamin D (CALCIUM-VITAMIN D) 500-200 MG-UNIT per tablet Take 1 tablet by mouth 2 (two) times daily with a meal.       . Clobetasol Prop Oint-Coal Tar (CLOBETA OINTMENT EX) Apply topically twice a week      . conjugated estrogens (PREMARIN) vaginal cream Place vaginally daily. Two times a week      . magnesium gluconate (MAGONATE) 500 MG tablet Take 500 mg by mouth daily.        No current facility-administered medications on file prior to  visit.   The PMH, PSH, Social History, Family History, Medications, and allergies have been reviewed in [facility], and have been updated if relevant.   Review of Systems    See HPI  Objective:   Physical Exam BP 122/76  Pulse 60  Temp(Src) 97.7 F (36.5 C) (Oral)  Ht 5' 7.75" (1.721 m)  Wt 149 lb (67.586 kg)  BMI 22.82 kg/m2  SpO2 97% Gen:  Alert, pleasant, NAD MSK: Left arm/shoulder- No tenderness to palpation, normal to inspection +arch sign, +empty can test Normal grip strength and sensation bilaterally       Assessment & Plan:

## 2014-01-27 NOTE — Progress Notes (Signed)
Pre-visit discussion using our clinic review tool. No additional management support is needed unless otherwise documented below in the visit note.  

## 2014-01-27 NOTE — Assessment & Plan Note (Signed)
New- will treat with steroid burst, tylenol. Exercises given in sports med advisor hand out. See AVS for details. Call or return to clinic prn if these symptoms worsen or fail to improve as anticipated. The patient indicates understanding of these issues and agrees with the plan.

## 2014-01-27 NOTE — Patient Instructions (Signed)
You have rotator cuff impingement Try to avoid painful activities (overhead activities, lifting with extended arm) as much as possible. Take Prednisone as directed- in the morning and with food. Can take tylenol in addition to this. Exercises in hand out given.  If no improvement, cortisone injection may be beneficial to help with pain and to decrease inflammation.  If not improving at follow-up we will consider imaging and or physical therapy.

## 2014-04-02 DIAGNOSIS — I10 Essential (primary) hypertension: Secondary | ICD-10-CM | POA: Insufficient documentation

## 2014-05-26 ENCOUNTER — Encounter: Payer: Self-pay | Admitting: Family Medicine

## 2014-05-26 ENCOUNTER — Ambulatory Visit (INDEPENDENT_AMBULATORY_CARE_PROVIDER_SITE_OTHER): Payer: BC Managed Care – PPO | Admitting: Family Medicine

## 2014-05-26 VITALS — BP 118/70 | HR 82 | Temp 97.7°F | Ht 67.75 in | Wt 144.8 lb

## 2014-05-26 DIAGNOSIS — F4323 Adjustment disorder with mixed anxiety and depressed mood: Secondary | ICD-10-CM

## 2014-05-26 MED ORDER — ALPRAZOLAM 0.5 MG PO TABS: 0.5000 mg | ORAL_TABLET | Freq: Every evening | ORAL | Status: DC | PRN

## 2014-05-26 MED ORDER — SERTRALINE HCL 25 MG PO TABS: 25.0000 mg | ORAL_TABLET | Freq: Every day | ORAL | Status: DC

## 2014-05-26 NOTE — Patient Instructions (Signed)
We are starting Zoloft 25 mg daily. Continue xanax at bedtime.  Call me in a few weeks with an update.

## 2014-05-26 NOTE — Progress Notes (Signed)
   Subjective:   Patient ID: Susan [name], female    DOB: 10/26/1951, 63 y.o.   MRN: [id number]  Susan [name] is a pleasant 63 y.o. year old female who presents to clinic today with Anxiety  on 05/26/2014  HPI:  Under increased stressors for past several months. A lot going on. Husband is an alcoholic.  He's 23 years older than her- he bought a condo at [facility] that she didn't want to get.  She does not enjoy going there as much as he does. Feels he did not respect her wishes and he drinks more when he is out there.  He does not think he has a problem.  Also her friend just got diagnosed with cancer.  Feels more tearful.  Not sleeping well- does take xanax 0.25-0.5 mg qhs prn insomnia.  Appetite ok. Wt Readings from Last 3 Encounters:  05/26/14 144 lb 12 oz (65.658 kg)  01/27/14 149 lb (67.586 kg)  10/02/13 143 lb 4 oz (64.978 kg)   Denies SI or HI.  Still enjoys gardening and seeing her friends.  Current Outpatient Prescriptions on File Prior to Visit  Medication Sig Dispense Refill  . Calcium Carbonate-Vitamin D (CALCIUM-VITAMIN D) 500-200 MG-UNIT per tablet Take 1 tablet by mouth 2 (two) times daily with a meal.       . Clobetasol Prop Oint-Coal Tar (CLOBETA OINTMENT EX) Apply topically twice a week      . conjugated estrogens (PREMARIN) vaginal cream Place vaginally daily. Two times a week      . magnesium gluconate (MAGONATE) 500 MG tablet Take 250 mg by mouth daily.        No current facility-administered medications on file prior to visit.    No Known Allergies  No past medical history on file.  Past Surgical History  Procedure Laterality Date  . Appendectomy    . Cholecystectomy      Family History  Problem Relation Age of Onset  . Hyperlipidemia Mother   . Hypertension Mother   . Stroke Father   . Hypertension Father     History   Social History  . Marital Status: Single    Spouse Name: N/A    Number of Children: N/A  . Years of  Education: N/A   Occupational History  . Not on file.   Social History Main Topics  . Smoking status: Never Smoker   . Smokeless tobacco: Not on file  . Alcohol Use: Not on file  . Drug Use: Not on file  . Sexual Activity: Not on file   Other Topics Concern  . Not on file   Social History Narrative  . No narrative on file   The PMH, PSH, Social History, Family History, Medications, and allergies have been reviewed in [facility], and have been updated if relevant.     Review of Systems    See HPI Objective:    BP 118/70  Pulse 82  Temp(Src) 97.7 F (36.5 C) (Oral)  Ht 5' 7.75" (1.721 m)  Wt 144 lb 12 oz (65.658 kg)  BMI 22.17 kg/m2  SpO2 97%   Physical Exam   Gen:  Alert, pleasant, NAD Psych:  Good eye contact, tearful but appropriate.     Assessment & Plan:   Adjustment disorder with mixed anxiety and depressed mood No Follow-up on file.

## 2014-05-26 NOTE — Assessment & Plan Note (Signed)
>  25 minutes spent in face to face time with patient, >50% spent in counselling or coordination of care Discussed tx options. She thinks her husband will not want to attend couples therapy. She will go to [employer].  Deferring psychotherapy at this time. Start Zoloft 25 mg daily. Continue prn xanax for sleep only- not having panic attacks. Follow up in 3 weeks.

## 2014-05-26 NOTE — Progress Notes (Signed)
Pre visit review using our clinic review tool, if applicable. No additional management support is needed unless otherwise documented below in the visit note. 

## 2014-07-18 ENCOUNTER — Other Ambulatory Visit: Payer: Self-pay | Admitting: Family Medicine

## 2015-01-21 DIAGNOSIS — C4491 Basal cell carcinoma of skin, unspecified: Secondary | ICD-10-CM

## 2015-01-21 HISTORY — DX: Basal cell carcinoma of skin, unspecified: C44.91

## 2015-09-23 ENCOUNTER — Ambulatory Visit (INDEPENDENT_AMBULATORY_CARE_PROVIDER_SITE_OTHER): Payer: PPO

## 2015-09-23 DIAGNOSIS — Z23 Encounter for immunization: Secondary | ICD-10-CM

## 2015-09-25 ENCOUNTER — Telehealth: Payer: Self-pay

## 2015-09-25 NOTE — Telephone Encounter (Signed)
Pt left note requesting a prescription for shingles vaccine; call pt when ready for pick up.

## 2015-09-28 MED ORDER — ZOSTER VACCINE LIVE 19400 UNT/0.65ML ~~LOC~~ SOLR: 0.6500 mL | Freq: Once | SUBCUTANEOUS | Status: DC

## 2015-09-28 NOTE — Telephone Encounter (Signed)
Rx printed

## 2015-09-28 NOTE — Telephone Encounter (Signed)
Lm on pts vm and informed her Rx is available for pickup from the front desk 

## 2016-01-07 ENCOUNTER — Ambulatory Visit (INDEPENDENT_AMBULATORY_CARE_PROVIDER_SITE_OTHER): Payer: PPO | Admitting: Family Medicine

## 2016-01-07 ENCOUNTER — Other Ambulatory Visit (HOSPITAL_COMMUNITY)
Admission: RE | Admit: 2016-01-07 | Discharge: 2016-01-07 | Disposition: A | Discharge status: Home / Self Care | Payer: PPO | Source: Ambulatory Visit | Attending: Family Medicine | Admitting: Family Medicine

## 2016-01-07 ENCOUNTER — Encounter: Payer: Self-pay | Admitting: Family Medicine

## 2016-01-07 VITALS — BP 122/84 | HR 60 | Temp 97.7°F | Wt 153.5 lb

## 2016-01-07 DIAGNOSIS — Z79890 Hormone replacement therapy (postmenopausal): Secondary | ICD-10-CM | POA: Diagnosis not present

## 2016-01-07 DIAGNOSIS — Z1151 Encounter for screening for human papillomavirus (HPV): Secondary | ICD-10-CM | POA: Diagnosis not present

## 2016-01-07 DIAGNOSIS — G4700 Insomnia, unspecified: Secondary | ICD-10-CM

## 2016-01-07 DIAGNOSIS — Z01419 Encounter for gynecological examination (general) (routine) without abnormal findings: Secondary | ICD-10-CM | POA: Insufficient documentation

## 2016-01-07 MED ORDER — ALPRAZOLAM 0.5 MG PO TABS: 0.5000 mg | ORAL_TABLET | Freq: Every evening | ORAL | Status: DC | PRN

## 2016-01-07 NOTE — Assessment & Plan Note (Signed)
>  25 min spent with patient, at least half of which was spent on counseling insomnia.  The problem of recurrent insomnia is discussed. Avoidance of caffeine sources is strongly encouraged. Sleep hygiene issues are reviewed. The use of sedative hypnotics for temporary relief is appropriate; we discussed the addictive nature of these drugs, and a one-time only prescription for prn use of a hypnotic is given, to use no more than 3 times per week for 2-3 weeks.   

## 2016-01-07 NOTE — Assessment & Plan Note (Signed)
We agreed to hold premarin and clobetasol and to reassess he symptoms in a few months.

## 2016-01-07 NOTE — Progress Notes (Signed)
Pre visit review using our clinic review tool, if applicable. No additional management support is needed unless otherwise documented below in the visit note. 

## 2016-01-07 NOTE — Patient Instructions (Addendum)
Great to see you. Please schedule a welcome to medicare visit.  Let's stop using the clobetasol and premarin for now and you can update me in a few months.

## 2016-01-07 NOTE — Addendum Note (Signed)
Addended by: Modena Nunnery on: 01/07/2016 08:43 AM   Modules accepted: Orders

## 2016-01-07 NOTE — Assessment & Plan Note (Signed)
Pap smear and mammogram done today.

## 2016-01-07 NOTE — Progress Notes (Signed)
Subjective:   Patient ID: Susan [name], female    DOB: Oct 17, 1951, 65 y.o.   MRN: [id number]  Susan [name] is a pleasant 65 y.o. year old female who presents to clinic today with Gynecologic Exam  on 01/07/2016  HPI:  G0P0, was going to OBGYN at [place] who retired.  Would like for me to take over GYN care. Was having pap smears done yearly but she is not sure if it some were bimanual exams.  LMP 2000 No h/o PMB.  Per pt, mammogram 02/23/2015.  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  She is no longer sexually active and questions if she truly needs this.  Insomnia- she does take xanax as needed at bedtime for insomnia, usually no more often than once a month.   Current Outpatient Prescriptions on File Prior to Visit  Medication Sig Dispense Refill  . Calcium Carbonate-Vitamin D (CALCIUM-VITAMIN D) 500-200 MG-UNIT per tablet Take 1 tablet by mouth 2 (two) times daily with a meal.     . Clobetasol Prop Oint-Coal Tar (CLOBETA OINTMENT EX) Apply topically twice a week    . conjugated estrogens (PREMARIN) vaginal cream Place vaginally daily. Two times a week    . magnesium gluconate (MAGONATE) 500 MG tablet Take 250 mg by mouth daily.      No current facility-administered medications on file prior to visit.    No Known Allergies  No past medical history on file.  Past Surgical History  Procedure Laterality Date  . Appendectomy    . Cholecystectomy      Family History  Problem Relation Age of Onset  . Hyperlipidemia Mother   . Hypertension Mother   . Stroke Father   . Hypertension Father     Social History   Social History  . Marital Status: Single    Spouse Name: N/A  . Number of Children: N/A  . Years of Education: N/A   Occupational History  . Not on file.   Social History Main Topics  . Smoking status: Never Smoker   . Smokeless tobacco: Not on file  . Alcohol Use: Not on file  . Drug Use: Not on file  .  Sexual Activity: Not on file   Other Topics Concern  . Not on file   Social History Narrative   The PMH, PSH, Social History, Family History, Medications, and allergies have been reviewed in [facility], and have been updated if relevant.   Review of Systems  Constitutional: Negative.   Genitourinary: Negative.   Musculoskeletal: Negative.   Skin: Negative.   Hematological: Negative.   Psychiatric/Behavioral: Negative.   All other systems reviewed and are negative.      Objective:    BP 122/84 mmHg  Pulse 60  Temp(Src) 97.7 F (36.5 C) (Oral)  Wt 153 lb 8 oz (69.627 kg)  SpO2 98%   Physical Exam    General:  Well-developed,well-nourished,in no acute distress; alert,appropriate and cooperative throughout examination Head:  normocephalic and atraumatic.   Eyes:  vision grossly intact, pupils equal, pupils round, and pupils reactive to light.   Ears:  R ear normal and L ear normal.   Nose:  no external deformity.   Mouth:  good dentition.   Neck:  No deformities, masses, or tenderness noted. Breasts:  No mass, nodules, thickening, tenderness, bulging, retraction, inflamation, nipple discharge or skin changes noted.   Lungs:  Normal respiratory effort, chest expands symmetrically. Lungs are clear to auscultation, no  crackles or wheezes. Heart:  Normal rate and regular rhythm. S1 and S2 normal without gallop, murmur, click, rub or other extra sounds. Abdomen:  Bowel sounds positive,abdomen soft and non-tender without masses, organomegaly or hernias noted. Rectal:  no external abnormalities.   Genitalia:  Pelvic Exam:        External: normal female genitalia without lesions or masses        Vagina: normal without lesions or masses        Cervix: normal without lesions or masses        Adnexa: normal bimanual exam without masses or fullness        Uterus: normal by palpation        Pap smear: performed Msk:  No deformity or scoliosis noted of thoracic or lumbar spine.     Extremities:  No clubbing, cyanosis, edema, or deformity noted with normal full range of motion of all joints.   Neurologic:  alert & oriented X3 and gait normal.   Skin:  Intact without suspicious lesions or rashes Cervical Nodes:  No lymphadenopathy noted Axillary Nodes:  No palpable lymphadenopathy Psych:  Cognition and judgment appear intact. Alert and cooperative with normal attention span and concentration. No apparent delusions, illusions, hallucinations      Assessment & Plan:   Encounter for gynecological examination  Postmenopausal HRT (hormone replacement therapy) No Follow-up on file.

## 2016-01-12 ENCOUNTER — Encounter: Payer: Self-pay | Admitting: *Deleted

## 2016-01-12 LAB — CYTOLOGY - PAP

## 2016-01-18 ENCOUNTER — Other Ambulatory Visit: Payer: Self-pay | Admitting: Family Medicine

## 2016-01-18 DIAGNOSIS — Z01419 Encounter for gynecological examination (general) (routine) without abnormal findings: Secondary | ICD-10-CM | POA: Insufficient documentation

## 2016-01-22 ENCOUNTER — Other Ambulatory Visit (INDEPENDENT_AMBULATORY_CARE_PROVIDER_SITE_OTHER): Payer: PPO

## 2016-01-22 DIAGNOSIS — Z0000 Encounter for general adult medical examination without abnormal findings: Secondary | ICD-10-CM | POA: Diagnosis not present

## 2016-01-22 DIAGNOSIS — Z01419 Encounter for gynecological examination (general) (routine) without abnormal findings: Secondary | ICD-10-CM

## 2016-01-22 LAB — COMPREHENSIVE METABOLIC PANEL
ALT: 16 U/L (ref 0–35)
AST: 19 U/L (ref 0–37)
Albumin: 4.3 g/dL (ref 3.5–5.2)
Alkaline Phosphatase: 58 U/L (ref 39–117)
BILIRUBIN TOTAL: 1 mg/dL (ref 0.2–1.2)
BUN: 14 mg/dL (ref 6–23)
CALCIUM: 9.7 mg/dL (ref 8.4–10.5)
CHLORIDE: 101 meq/L (ref 96–112)
CO2: 30 meq/L (ref 19–32)
Creatinine, Ser: 0.81 mg/dL (ref 0.40–1.20)
GFR: 75.31 mL/min (ref >60.00)
GLUCOSE: 97 mg/dL (ref 70–99)
POTASSIUM: 4.9 meq/L (ref 3.5–5.1)
Sodium: 138 mEq/L (ref 135–145)
TOTAL PROTEIN: 6.8 g/dL (ref 6.0–8.3)

## 2016-01-22 LAB — LIPID PANEL
CHOL/HDL RATIO: 3
Cholesterol: 191 mg/dL (ref 0–200)
HDL: 70.6 mg/dL (ref >39.00)
LDL CALC: 108 mg/dL — AB (ref 0–99)
NONHDL: 120.86
TRIGLYCERIDES: 62 mg/dL (ref 0.0–149.0)
VLDL: 12.4 mg/dL (ref 0.0–40.0)

## 2016-01-22 LAB — CBC WITH DIFFERENTIAL/PLATELET
BASOS ABS: 0 10*3/uL (ref 0.0–0.1)
BASOS PCT: 1.1 % (ref 0.0–3.0)
EOS PCT: 3.4 % (ref 0.0–5.0)
Eosinophils Absolute: 0.1 10*3/uL (ref 0.0–0.7)
HEMATOCRIT: 45.2 % (ref 36.0–46.0)
Hemoglobin: 15 g/dL (ref 12.0–15.0)
LYMPHS PCT: 31.5 % (ref 12.0–46.0)
Lymphs Abs: 1.4 10*3/uL (ref 0.7–4.0)
MCHC: 33.2 g/dL (ref 30.0–36.0)
MCV: 89.7 fl (ref 78.0–100.0)
MONOS PCT: 12.3 % — AB (ref 3.0–12.0)
Monocytes Absolute: 0.5 10*3/uL (ref 0.1–1.0)
NEUTROS ABS: 2.2 10*3/uL (ref 1.4–7.7)
Neutrophils Relative %: 51.7 % (ref 43.0–77.0)
PLATELETS: 288 10*3/uL (ref 150.0–400.0)
RBC: 5.04 Mil/uL (ref 3.87–5.11)
RDW: 13.7 % (ref 11.5–15.5)
WBC: 4.3 10*3/uL (ref 4.0–10.5)

## 2016-01-22 LAB — TSH: TSH: 2.12 u[IU]/mL (ref 0.35–4.50)

## 2016-01-25 ENCOUNTER — Encounter: Payer: Self-pay | Admitting: Family Medicine

## 2016-01-25 ENCOUNTER — Ambulatory Visit (INDEPENDENT_AMBULATORY_CARE_PROVIDER_SITE_OTHER): Payer: PPO | Admitting: Family Medicine

## 2016-01-25 VITALS — BP 134/68 | HR 68 | Temp 97.4°F | Ht 67.75 in | Wt 154.0 lb

## 2016-01-25 DIAGNOSIS — E2839 Other primary ovarian failure: Secondary | ICD-10-CM | POA: Diagnosis not present

## 2016-01-25 DIAGNOSIS — Z1211 Encounter for screening for malignant neoplasm of colon: Secondary | ICD-10-CM

## 2016-01-25 DIAGNOSIS — G4700 Insomnia, unspecified: Secondary | ICD-10-CM | POA: Diagnosis not present

## 2016-01-25 DIAGNOSIS — Z23 Encounter for immunization: Secondary | ICD-10-CM | POA: Diagnosis not present

## 2016-01-25 DIAGNOSIS — Z1239 Encounter for other screening for malignant neoplasm of breast: Secondary | ICD-10-CM

## 2016-01-25 DIAGNOSIS — Z0000 Encounter for general adult medical examination without abnormal findings: Secondary | ICD-10-CM | POA: Diagnosis not present

## 2016-01-25 NOTE — Addendum Note (Signed)
Addended by: Lucille Passy on: 01/25/2016 09:22 AM   Modules accepted: Miquel Dunn

## 2016-01-25 NOTE — Patient Instructions (Signed)
Great to see you! Have a wonderful cruise! 

## 2016-01-25 NOTE — Addendum Note (Signed)
Addended by: Modena Nunnery on: 01/25/2016 09:34 AM   Modules accepted: Orders

## 2016-01-25 NOTE — Addendum Note (Signed)
Addended by: Ellamae Sia on: 01/25/2016 09:50 AM   Modules accepted: Orders

## 2016-01-25 NOTE — Progress Notes (Signed)
Subjective:   Patient ID: Susan [name], female    DOB: Jan 21, 1951, 65 y.o.   MRN: [id number]  Susan [name] is a pleasant 65 y.o. year old female who presents to clinic today with Annual Exam  on 01/25/2016  HPI:  I have personally reviewed the Medicare Annual Wellness questionnaire and have noted 1. The patient's medical and social history 2. Their use of alcohol, tobacco or illicit drugs 3. Their current medications and supplements 4. The patient's functional ability including ADL's, fall risks, home safety risks and hearing or visual             impairment. 5. Diet and physical activities 6. Evidence for depression or mood disorders  End of life wishes discussed and updated in Social History.  The roster of all physicians providing medical care to patient - is listed in the [employer] section of the chart.  Zoster 09/29/15 Td 12/03/08 Influenza vaccine 09/23/15 Mammogram 01/2016  Lab Results  Component Value Date   CHOL 191 01/22/2016   HDL 70.60 01/22/2016   LDLCALC 108* 01/22/2016   TRIG 62.0 01/22/2016   CHOLHDL 3 01/22/2016   Lab Results  Component Value Date   CREATININE 0.81 01/22/2016   Lab Results  Component Value Date   TSH 2.12 01/22/2016   Lab Results  Component Value Date   WBC 4.3 01/22/2016   HGB 15.0 01/22/2016   HCT 45.2 01/22/2016   MCV 89.7 01/22/2016   PLT 288.0 01/22/2016   Current Outpatient Prescriptions on File Prior to Visit  Medication Sig Dispense Refill  . ALPRAZolam (XANAX) 0.5 MG tablet Take 1 tablet (0.5 mg total) by mouth at bedtime as needed for sleep. 30 tablet 0  . Calcium Carbonate-Vitamin D (CALCIUM-VITAMIN D) 500-200 MG-UNIT per tablet Take 1 tablet by mouth 2 (two) times daily with a meal.     . magnesium gluconate (MAGONATE) 500 MG tablet Take 250 mg by mouth daily.      No current facility-administered medications on file prior to visit.    No Known Allergies  No past medical history on file.  Past Surgical  History  Procedure Laterality Date  . Appendectomy    . Cholecystectomy      Family History  Problem Relation Age of Onset  . Hyperlipidemia Mother   . Hypertension Mother   . Stroke Father   . Hypertension Father     Social History   Social History  . Marital Status: Single    Spouse Name: N/A  . Number of Children: N/A  . Years of Education: N/A   Occupational History  . Not on file.   Social History Main Topics  . Smoking status: Never Smoker   . Smokeless tobacco: Not on file  . Alcohol Use: Not on file  . Drug Use: Not on file  . Sexual Activity: Not on file   Other Topics Concern  . Not on file   Social History Narrative   Desires CPR   The PMH, PSH, Social History, Family History, Medications, and allergies have been reviewed in [facility], and have been updated if relevant.  Review of Systems  Constitutional: Negative.   HENT: Negative.   Eyes: Negative.   Respiratory: Negative.   Cardiovascular: Negative.   Gastrointestinal: Negative.   Endocrine: Negative.   Genitourinary: Negative.   Musculoskeletal: Negative.   Skin: Negative.   Allergic/Immunologic: Negative.   Neurological: Negative.   Hematological: Negative.   Psychiatric/Behavioral: Negative.   All other systems reviewed and are  negative.      Objective:    BP 134/68 mmHg  Pulse 68  Temp(Src) 97.4 F (36.3 C) (Oral)  Ht 5' 7.75" (1.721 m)  Wt 154 lb (69.854 kg)  BMI 23.58 kg/m2  SpO2 97%   Physical Exam   General:  Well-developed,well-nourished,in no acute distress; alert,appropriate and cooperative throughout examination Head:  normocephalic and atraumatic.   Eyes:  vision grossly intact, pupils equal, pupils round, and pupils reactive to light.   Ears:  R ear normal and L ear normal.   Nose:  no external deformity.   Mouth:  good dentition.   Neck:  No deformities, masses, or tenderness noted. Breasts:   Lungs:  Normal respiratory effort, chest expands symmetrically. Lungs  are clear to auscultation, no crackles or wheezes. Heart:  Normal rate and regular rhythm. S1 and S2 normal without gallop, murmur, click, rub or other extra sounds. Abdomen:  Bowel sounds positive,abdomen soft and non-tender without masses, organomegaly or hernias noted. Genitalia:  deferred done earlier this month. Msk:  No deformity or scoliosis noted of thoracic or lumbar spine.   Extremities:  No clubbing, cyanosis, edema, or deformity noted with normal full range of motion of all joints.   Neurologic:  alert & oriented X3 and gait normal.   Skin:  Intact without suspicious lesions or rashes Cervical Nodes:  No lymphadenopathy noted Axillary Nodes:  No palpable lymphadenopathy Psych:  Cognition and judgment appear intact. Alert and cooperative with normal attention span and concentration. No apparent delusions, illusions, hallucinations       Assessment & Plan:   Welcome to Medicare preventive visit - Plan: EKG 12-Lead  Insomnia No Follow-up on file.

## 2016-01-25 NOTE — Progress Notes (Signed)
Pre visit review using our clinic review tool, if applicable. No additional management support is needed unless otherwise documented below in the visit note. 

## 2016-01-25 NOTE — Assessment & Plan Note (Addendum)
The patients weight, height, BMI and visual acuity have been recorded in the chart.  Cognitive function assessed.   I have made referrals, counseling and provided education to the patient based review of the above and I have provided the pt with a written personalized care plan for preventive services.  EKG reassuring- sinus rhythm with occasional PACs.  prevnar 13 given today.  IFOB ordered.  Mammogram and DEXA ordered- pt to call to schedule (number for [facility] given to pt).

## 2016-02-04 ENCOUNTER — Other Ambulatory Visit (INDEPENDENT_AMBULATORY_CARE_PROVIDER_SITE_OTHER): Payer: PPO

## 2016-02-04 DIAGNOSIS — Z1211 Encounter for screening for malignant neoplasm of colon: Secondary | ICD-10-CM

## 2016-02-04 LAB — FECAL OCCULT BLOOD, IMMUNOCHEMICAL: FECAL OCCULT BLD: NEGATIVE

## 2016-02-05 ENCOUNTER — Encounter: Payer: Self-pay | Admitting: *Deleted

## 2016-02-10 ENCOUNTER — Ambulatory Visit
Admission: RE | Admit: 2016-02-10 | Discharge: 2016-02-10 | Disposition: A | Discharge status: Home / Self Care | Payer: PPO | Source: Ambulatory Visit | Attending: Family Medicine | Admitting: Family Medicine

## 2016-02-10 DIAGNOSIS — M85852 Other specified disorders of bone density and structure, left thigh: Secondary | ICD-10-CM | POA: Diagnosis not present

## 2016-02-10 DIAGNOSIS — E2839 Other primary ovarian failure: Secondary | ICD-10-CM

## 2016-02-10 DIAGNOSIS — Z1231 Encounter for screening mammogram for malignant neoplasm of breast: Secondary | ICD-10-CM | POA: Insufficient documentation

## 2016-02-10 DIAGNOSIS — M8580 Other specified disorders of bone density and structure, unspecified site: Secondary | ICD-10-CM | POA: Diagnosis not present

## 2016-02-10 DIAGNOSIS — Z13820 Encounter for screening for osteoporosis: Secondary | ICD-10-CM | POA: Insufficient documentation

## 2016-02-10 DIAGNOSIS — Z1239 Encounter for other screening for malignant neoplasm of breast: Secondary | ICD-10-CM

## 2016-02-11 ENCOUNTER — Encounter: Payer: Self-pay | Admitting: *Deleted

## 2016-05-26 DIAGNOSIS — Z85828 Personal history of other malignant neoplasm of skin: Secondary | ICD-10-CM | POA: Diagnosis not present

## 2016-05-26 DIAGNOSIS — L821 Other seborrheic keratosis: Secondary | ICD-10-CM | POA: Diagnosis not present

## 2016-05-26 DIAGNOSIS — I8393 Asymptomatic varicose veins of bilateral lower extremities: Secondary | ICD-10-CM | POA: Diagnosis not present

## 2016-05-26 DIAGNOSIS — L820 Inflamed seborrheic keratosis: Secondary | ICD-10-CM | POA: Diagnosis not present

## 2016-05-26 DIAGNOSIS — D229 Melanocytic nevi, unspecified: Secondary | ICD-10-CM | POA: Diagnosis not present

## 2016-05-26 DIAGNOSIS — L718 Other rosacea: Secondary | ICD-10-CM | POA: Diagnosis not present

## 2016-05-26 DIAGNOSIS — L570 Actinic keratosis: Secondary | ICD-10-CM | POA: Diagnosis not present

## 2016-05-26 DIAGNOSIS — C44519 Basal cell carcinoma of skin of other part of trunk: Secondary | ICD-10-CM | POA: Diagnosis not present

## 2016-05-26 DIAGNOSIS — D1800 Hemangioma unspecified site: Secondary | ICD-10-CM | POA: Diagnosis not present

## 2016-05-26 DIAGNOSIS — L578 Other skin changes due to chronic exposure to nonionizing radiation: Secondary | ICD-10-CM | POA: Diagnosis not present

## 2016-05-26 DIAGNOSIS — L812 Freckles: Secondary | ICD-10-CM | POA: Diagnosis not present

## 2016-05-26 DIAGNOSIS — Z1283 Encounter for screening for malignant neoplasm of skin: Secondary | ICD-10-CM | POA: Diagnosis not present

## 2016-08-21 ENCOUNTER — Ambulatory Visit
Admission: EM | Admit: 2016-08-21 | Discharge: 2016-08-21 | Disposition: A | Discharge status: Home / Self Care | Payer: PPO | Attending: Family Medicine | Admitting: Family Medicine

## 2016-08-21 ENCOUNTER — Encounter: Payer: Self-pay | Admitting: Emergency Medicine

## 2016-08-21 DIAGNOSIS — T1511XA Foreign body in conjunctival sac, right eye, initial encounter: Secondary | ICD-10-CM | POA: Diagnosis not present

## 2016-08-21 DIAGNOSIS — H00023 Hordeolum internum right eye, unspecified eyelid: Secondary | ICD-10-CM

## 2016-08-21 DIAGNOSIS — H00013 Hordeolum externum right eye, unspecified eyelid: Secondary | ICD-10-CM | POA: Diagnosis not present

## 2016-08-21 DIAGNOSIS — S00251A Superficial foreign body of right eyelid and periocular area, initial encounter: Secondary | ICD-10-CM

## 2016-08-21 MED ORDER — CEFUROXIME AXETIL 500 MG PO TABS: 500.0000 mg | ORAL_TABLET | Freq: Two times a day (BID) | ORAL | 0 refills | Status: DC

## 2016-08-21 NOTE — ED Provider Notes (Addendum)
[facility]    CSN: [id number] Arrival date & time: 08/21/16  0911  First Provider Contact:  First MD Initiated Contact with Patient 08/21/16 1015        History   Chief Complaint Chief Complaint  Patient presents with  . Eye Problem    HPI Susan [name] is a 65 y.o. female.   Sincerely because of irritation in the right eye. States it started Thursday night she felt something in the eye. She May Be in Excess Miss Mascara She Try to Avoid the Mascara off Friday Dose of Low Irritation but since Then Irritation Is Gotten Worse Saturday Worse Than Friday and Today She Woke up with a Large about That and I. She States the Right Eyelid Is Also Been Swollen and Very Painful As Well. She Denies Any Other Major Medical Problems with Dry. She's Had Appendectomy Cholecystectomy and Breast Excisional Biopsy in the past. She Does Not Smoke Dips Snuff Tobacco or Using Illicit Drugs. He Denies Any Chronic Medications Her Chronic Medical Problems Other Than Postmenopausal and She Did Denies Any Pertinent Family Medical Problems Significant to Today's Visit.   The history is provided by the patient. No language interpreter was used.  Eye Problem  Location:  Right eye Quality:  Aching and foreign body sensation Severity:  Moderate Duration:  4 days Timing:  Constant Progression:  Worsening Chronicity:  New Context: not burn, not chemical exposure, not contact lens problem, not direct trauma, not foreign body, not using machinery, not scratch and not smoke exposure   Relieved by:  None tried Worsened by:  Nothing Ineffective treatments:  None tried Associated symptoms: crusting, discharge, itching, redness and tearing   Risk factors: no conjunctival hemorrhage, no exposure to pinkeye, no previous injury to eye, no recent herpes zoster and no recent URI     History reviewed. No pertinent past medical history.  Patient Active Problem List   Diagnosis Date Noted  . Welcome to  Medicare preventive visit 01/25/2016  . Postmenopausal HRT (hormone replacement therapy) 01/07/2016  . Insomnia 01/07/2016  . Adjustment disorder with mixed anxiety and depressed mood 05/26/2014    Past Surgical History:  Procedure Laterality Date  . APPENDECTOMY    . BREAST EXCISIONAL BIOPSY Right 2000   benign  . CHOLECYSTECTOMY      OB History    No data available       Home Medications    Prior to Admission medications   Medication Sig Start Date End Date Taking? Authorizing Provider  ALPRAZolam Duanne Moron) 0.5 MG tablet Take 1 tablet (0.5 mg total) by mouth at bedtime as needed for sleep. 01/07/16   Lucille Passy, MD  Calcium Carbonate-Vitamin D (CALCIUM-VITAMIN D) 500-200 MG-UNIT per tablet Take 1 tablet by mouth 2 (two) times daily with a meal.     Historical Provider, MD  cefUROXime (CEFTIN) 500 MG tablet Take 1 tablet (500 mg total) by mouth 2 (two) times daily. 08/21/16   Frederich Cha, MD  magnesium gluconate (MAGONATE) 500 MG tablet Take 250 mg by mouth daily.     Historical Provider, MD    Family History Family History  Problem Relation Age of Onset  . Hyperlipidemia Mother   . Hypertension Mother   . Stroke Father   . Hypertension Father     Social History Social History  Substance Use Topics  . Smoking status: Never Smoker  . Smokeless tobacco: Never Used  . Alcohol use Yes     Allergies  Review of patient's allergies indicates no known allergies.   Review of Systems Review of Systems  Eyes: Positive for pain, discharge, redness and itching.  All other systems reviewed and are negative.    Physical Exam Triage Vital Signs ED Triage Vitals  Enc Vitals Group     BP 08/21/16 1010 (!) 157/63     Pulse Rate 08/21/16 1010 60     Resp 08/21/16 1010 16     Temp 08/21/16 1010 97.8 F (36.6 C)     Temp Source 08/21/16 1010 Tympanic     SpO2 08/21/16 1010 98 %     Weight 08/21/16 1011 154 lb (69.9 kg)     Height 08/21/16 1011 5\' 8"  (1.727 m)      Head Circumference --      Peak Flow --      Pain Score 08/21/16 1013 1     Pain Loc --      Pain Edu? --      Excl. in [place]? --    No data found.   Updated Vital Signs BP (!) 157/63 (BP Location: Left Arm)   Pulse 60   Temp 97.8 F (36.6 C) (Tympanic)   Resp 16   Ht 5\' 8"  (1.727 m)   Wt 154 lb (69.9 kg)   SpO2 98%   BMI 23.42 kg/m   Visual Acuity Right Eye Distance: 20/25 corrected Left Eye Distance: 20/20 corrected Bilateral Distance:    Right Eye Near:   Left Eye Near:    Bilateral Near:     Physical Exam  Constitutional: She is oriented to person, place, and time. She appears well-developed and well-nourished.  HENT:  Head: Normocephalic and atraumatic.  Right Ear: External ear normal.  Left Ear: External ear normal.  Mouth/Throat: Oropharynx is clear and moist.  Eyes: EOM are normal. Pupils are equal, round, and reactive to light. Right eye exhibits discharge and hordeolum. Foreign body present in the right eye. Left eye exhibits no discharge, no exudate and no hordeolum. No foreign body present in the left eye. Right conjunctiva is injected. Left conjunctiva is not injected. Left conjunctiva has no hemorrhage.  Patient right eyelid swollen initially when the right eyelid was inverted just take a good look appears to be a stye was present for the also some other material was seen underneath eyelid as well. Because of that patient I was stained.  Neck: Normal range of motion.  Pulmonary/Chest: Effort normal.  Musculoskeletal: Normal range of motion.  Neurological: She is alert and oriented to person, place, and time.  Skin: Skin is warm. She is not diaphoretic.  Psychiatric: She has a normal mood and affect.  Vitals reviewed.    UC Treatments / Results  Labs (all labs ordered are listed, but only abnormal results are displayed) Labs Reviewed - No data to display  EKG  EKG Interpretation None       Radiology No results found.  Procedures .Foreign  Body Removal Date/Time: 08/21/2016 11:03 AM Performed by: Frederich Cha Authorized by: Frederich Cha  Consent: Verbal consent obtained. Body area: eye Location details: right conjunctiva Anesthesia: local infiltration  Anesthesia: Local Anesthetic: tetracaine drops Anesthetic total: 3 mL  Sedation: Patient sedated: no Patient restrained: no Localization method: eyelid eversion and magnification Removal mechanism: eyelid eversion and moist cotton swab Eye examined with fluorescein. No residual rust ring present. Depth: superficial 1 objects recovered. Objects recovered: Some type of mucus-like material Post-procedure assessment: foreign body removed Patient tolerance: Patient tolerated the  procedure well with no immediate complications Comments: Patient tolerated procedure well   (including critical care time)  Medications Ordered in UC Medications - No data to display   Initial Impression / Assessment and Plan / UC Course  I have reviewed the triage vital signs and the nursing notes.  Pertinent labs & imaging results that were available during my care of the patient were reviewed by me and considered in my medical decision making (see chart for details).  Clinical Course    Patient tolerated the sustaining an inversion of the eyelid with removal of the mucous-like material. Because of the presence of what appeared to be a stye earlier she will be placed on Ceftin 500 mg twice a day for 1 week. Patient declined pain medication. Work note given for tomorrow. Patient instructed to see ophthalmologist of choice if worse Monday or if not a lot better by Tuesday  Final Clinical Impressions(s) / UC Diagnoses   Final diagnoses:  Hordeolum, right  Sty, internal, right  Acute foreign body of right eyelid, initial encounter    New Prescriptions Discharge Medication List as of 08/21/2016 10:57 AM    START taking these medications   Details  cefUROXime (CEFTIN) 500 MG tablet  Take 1 tablet (500 mg total) by mouth 2 (two) times daily., Starting Sun 08/21/2016, Normal        Note: This dictation was prepared with Dragon dictation along with smaller phrase technology. Any transcriptional errors that result from this process are unintentional.   Frederich Cha, MD 08/21/16 1107    Frederich Cha, MD 08/21/16 [phone]

## 2016-08-21 NOTE — ED Triage Notes (Signed)
Patient c/o redness and irritation in her right eye that started on Thursday. Patient reports some drainage.

## 2016-08-31 DIAGNOSIS — L821 Other seborrheic keratosis: Secondary | ICD-10-CM | POA: Diagnosis not present

## 2016-08-31 DIAGNOSIS — L578 Other skin changes due to chronic exposure to nonionizing radiation: Secondary | ICD-10-CM | POA: Diagnosis not present

## 2016-08-31 DIAGNOSIS — L570 Actinic keratosis: Secondary | ICD-10-CM | POA: Diagnosis not present

## 2016-08-31 DIAGNOSIS — Z85828 Personal history of other malignant neoplasm of skin: Secondary | ICD-10-CM | POA: Diagnosis not present

## 2016-08-31 DIAGNOSIS — L820 Inflamed seborrheic keratosis: Secondary | ICD-10-CM | POA: Diagnosis not present

## 2016-09-16 ENCOUNTER — Ambulatory Visit (INDEPENDENT_AMBULATORY_CARE_PROVIDER_SITE_OTHER): Payer: PPO

## 2016-09-16 DIAGNOSIS — Z23 Encounter for immunization: Secondary | ICD-10-CM | POA: Diagnosis not present

## 2016-11-10 DIAGNOSIS — M7732 Calcaneal spur, left foot: Secondary | ICD-10-CM | POA: Diagnosis not present

## 2016-11-10 DIAGNOSIS — M79672 Pain in left foot: Secondary | ICD-10-CM | POA: Diagnosis not present

## 2016-11-10 DIAGNOSIS — M722 Plantar fascial fibromatosis: Secondary | ICD-10-CM | POA: Diagnosis not present

## 2016-12-08 DIAGNOSIS — M7752 Other enthesopathy of left foot: Secondary | ICD-10-CM | POA: Diagnosis not present

## 2016-12-08 DIAGNOSIS — M722 Plantar fascial fibromatosis: Secondary | ICD-10-CM | POA: Diagnosis not present

## 2017-02-09 DIAGNOSIS — H348322 Tributary (branch) retinal vein occlusion, left eye, stable: Secondary | ICD-10-CM | POA: Diagnosis not present

## 2017-03-15 ENCOUNTER — Telehealth: Payer: Self-pay | Admitting: Family Medicine

## 2017-03-15 NOTE — Telephone Encounter (Signed)
Pt made appointment through my chart is it ok to wait to 3/26 See below reason  Message   Appointment For: Susan [name],Susan [name] ([id number])  Visit Type: MYCHART OFFICE VISIT (1064)    03/20/2017  8:00 AM 15 mins. Lucille Passy, MD     [facility]    Patient Comments:  Office Visit  Feel that blood pressure is running high.

## 2017-03-16 NOTE — Telephone Encounter (Signed)
Spoke to pt. 138/88, 143/88, 130/96. The average HR was 155. It did come down 80. She has not had anymore episodes. She said she tends to have acid reflux. She has an appt on Monday. I advised her to be seen here or at an UC if the HR goes up.

## 2017-03-16 NOTE — Telephone Encounter (Signed)
Difficult for me to say if it is safe for her to wait as I have idea what she means by high blood pressure- ie how high has it been running?

## 2017-03-20 ENCOUNTER — Ambulatory Visit (INDEPENDENT_AMBULATORY_CARE_PROVIDER_SITE_OTHER): Payer: PPO | Admitting: Family Medicine

## 2017-03-20 ENCOUNTER — Encounter: Payer: Self-pay | Admitting: Family Medicine

## 2017-03-20 DIAGNOSIS — R002 Palpitations: Secondary | ICD-10-CM | POA: Diagnosis not present

## 2017-03-20 DIAGNOSIS — K219 Gastro-esophageal reflux disease without esophagitis: Secondary | ICD-10-CM

## 2017-03-20 LAB — CBC WITH DIFFERENTIAL/PLATELET
BASOS ABS: 0.1 10*3/uL (ref 0.0–0.1)
Basophils Relative: 1.2 % (ref 0.0–3.0)
EOS ABS: 0.1 10*3/uL (ref 0.0–0.7)
Eosinophils Relative: 2.4 % (ref 0.0–5.0)
HEMATOCRIT: 45.2 % (ref 36.0–46.0)
HEMOGLOBIN: 15.2 g/dL — AB (ref 12.0–15.0)
LYMPHS PCT: 20.6 % (ref 12.0–46.0)
Lymphs Abs: 1.1 10*3/uL (ref 0.7–4.0)
MCHC: 33.7 g/dL (ref 30.0–36.0)
MCV: 89.7 fl (ref 78.0–100.0)
Monocytes Absolute: 0.6 10*3/uL (ref 0.1–1.0)
Monocytes Relative: 11.8 % (ref 3.0–12.0)
Neutro Abs: 3.5 10*3/uL (ref 1.4–7.7)
Neutrophils Relative %: 64 % (ref 43.0–77.0)
PLATELETS: 281 10*3/uL (ref 150.0–400.0)
RBC: 5.03 Mil/uL (ref 3.87–5.11)
RDW: 13.7 % (ref 11.5–15.5)
WBC: 5.5 10*3/uL (ref 4.0–10.5)

## 2017-03-20 LAB — COMPREHENSIVE METABOLIC PANEL
ALBUMIN: 4.4 g/dL (ref 3.5–5.2)
ALT: 17 U/L (ref 0–35)
AST: 21 U/L (ref 0–37)
Alkaline Phosphatase: 60 U/L (ref 39–117)
BILIRUBIN TOTAL: 1 mg/dL (ref 0.2–1.2)
BUN: 16 mg/dL (ref 6–23)
CALCIUM: 9.9 mg/dL (ref 8.4–10.5)
CO2: 30 mEq/L (ref 19–32)
CREATININE: 0.86 mg/dL (ref 0.40–1.20)
Chloride: 103 mEq/L (ref 96–112)
GFR: 70.03 mL/min (ref >60.00)
Glucose, Bld: 88 mg/dL (ref 70–99)
Potassium: 4.8 mEq/L (ref 3.5–5.1)
Sodium: 138 mEq/L (ref 135–145)
Total Protein: 6.9 g/dL (ref 6.0–8.3)

## 2017-03-20 LAB — TSH: TSH: 1.88 u[IU]/mL (ref 0.35–4.50)

## 2017-03-20 NOTE — Assessment & Plan Note (Signed)
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 Will refer to cardiology- re: ? holter monitor. Check labs today as well.

## 2017-03-20 NOTE — Assessment & Plan Note (Signed)
Refer to GI. Re- ? Repeat endo. Continue H2 blocker and gas x as needed. The patient indicates understanding of these issues and agrees with the plan.

## 2017-03-20 NOTE — Progress Notes (Signed)
Subjective:   Patient ID: Susan [name], female    DOB: [birth year], 66 y.o.   MRN: [id number]  Susan [name] is a pleasant 66 y.o. year old female who presents to clinic today with Hypertension (Pt stated having light headed and heart rate is elevated.)  on 03/20/2017  HPI:  Had an episode over the weekend when she felt like her heart was racing.  Checked her blood pressure and it was 149/92 and pulse was in the 150s. Episode lasted approximately 15 minutes.  She did become anxious about it but was not anxious or not feeling well to it starting.  Has had several episodes of belching and heart racing over the past few weeks.  Did not have GI symptoms with the recent episode this weekend.  No CP.  No diaphoresis or SOB.  Had endoscopy over 10 years ago that showed hiatal hernia.  She has been taking simethicone for this. She is not sure if it is helping or not. Current Outpatient Prescriptions on File Prior to Visit  Medication Sig Dispense Refill  . ALPRAZolam (XANAX) 0.5 MG tablet Take 1 tablet (0.5 mg total) by mouth at bedtime as needed for sleep. 30 tablet 0  . Calcium Carbonate-Vitamin D (CALCIUM-VITAMIN D) 500-200 MG-UNIT per tablet Take 1 tablet by mouth 2 (two) times daily with a meal.     . magnesium gluconate (MAGONATE) 500 MG tablet Take 250 mg by mouth daily.      No current facility-administered medications on file prior to visit.     No Known Allergies  No past medical history on file.  Past Surgical History:  Procedure Laterality Date  . APPENDECTOMY    . BREAST EXCISIONAL BIOPSY Right 2000   benign  . CHOLECYSTECTOMY      Family History  Problem Relation Age of Onset  . Hyperlipidemia Mother   . Hypertension Mother   . Stroke Father   . Hypertension Father     Social History   Social History  . Marital status: Single    Spouse name: N/A  . Number of children: N/A  . Years of education: N/A   Occupational History  . Not on file.   Social  History Main Topics  . Smoking status: Never Smoker  . Smokeless tobacco: Never Used  . Alcohol use Yes  . Drug use: No  . Sexual activity: Not on file   Other Topics Concern  . Not on file   Social History Narrative   She does have a living will.   Desires CPR   The PMH, PSH, Social History, Family History, Medications, and allergies have been reviewed in [facility], and have been updated if relevant.   Review of Systems  Constitutional: Negative.   Cardiovascular: Positive for palpitations. Negative for chest pain and leg swelling.  Gastrointestinal:       + burping  Musculoskeletal: Negative.   Skin: Negative.   Neurological: Positive for light-headedness. Negative for weakness and headaches.  Psychiatric/Behavioral: Negative.   All other systems reviewed and are negative.      Objective:    BP 128/82   Pulse 69   Temp 97.1 F (36.2 C)   Ht 5\' 8"  (1.727 m)   Wt 157 lb (71.2 kg)   SpO2 98%   BMI 23.87 kg/m    Physical Exam  Constitutional: She is oriented to person, place, and time. She appears well-developed and well-nourished. No distress.  HENT:  Head: Normocephalic and atraumatic.  Eyes: Conjunctivae are  normal.  Cardiovascular: Normal rate and regular rhythm.   Pulmonary/Chest: Effort normal and breath sounds normal.  Musculoskeletal: Normal range of motion. She exhibits no edema.  Neurological: She is alert and oriented to person, place, and time. No cranial nerve deficit.  Skin: Skin is warm and dry. She is not diaphoretic.  Psychiatric: She has a normal mood and affect. Her behavior is normal. Judgment and thought content normal.  Nursing note and vitals reviewed.         Assessment & Plan:   Gastroesophageal reflux disease, esophagitis presence not specified  Palpitations - Plan: EKG 12-Lead No Follow-up on file.

## 2017-03-20 NOTE — Patient Instructions (Signed)
Great to see you.  We will call you with your lab results and you appointments.

## 2017-03-23 ENCOUNTER — Ambulatory Visit: Payer: PPO | Admitting: Internal Medicine

## 2017-04-03 NOTE — Progress Notes (Signed)
Cardiology Office Note   Date:  04/04/2017   ID:  Susan [name], DOB 19-Aug-1951, MRN [id number]  Referring Doctor:  Arnette Norris, MD   Cardiologist:   Wende Bushy, MD   Reason for consultation:  Chief Complaint  Patient presents with  . other    NP. referred by Dr.Aaron for palpitations. Pt c/o racing heart, sob, feeling lightheaded. Reviewed meds with pt verablly.      History of Present Illness: Susan [name] is a 66 y.o. female who presents for palpitations  2 weeks ago, heart racing all of a sudden, assoc with burping. Checked with BP monitor --- HR 155. Infrequently, no known precipitating factors, mild-moderate in intensity, lasts minutes at a time, less than 58mins or so, resolving spontaneously  Also reports other episodes of feeling lightheaded for a second or so then will start burping, sometimes with heart racing, soemtimes without  No CP or SOB. Goes to gym 6x a week, recumbent bike, walking. No issues with activity.  No syncope.    ROS:  Please see the history of present illness. Aside from mentioned under HPI, all other systems are reviewed and negative.     Past Medical History:  Diagnosis Date  . Gastric reflux 90's  . Hiatal hernia 2003    Past Surgical History:  Procedure Laterality Date  . APPENDECTOMY    . BREAST EXCISIONAL BIOPSY Right 2000   benign  . CHOLECYSTECTOMY    . TONSILLECTOMY     age 37  . UPPER GI ENDOSCOPY  2003     reports that she has never smoked. She has never used smokeless tobacco. She reports that she drinks about 1.2 oz of alcohol per week . She reports that she does not use drugs.   family history includes COPD in her mother; Healthy in her brother; Heart failure in her mother; Hyperlipidemia in her mother; Hypertension in her brother, father, mother, and sister; Stroke in her father.   Outpatient Medications Prior to Visit  Medication Sig Dispense Refill  . ALPRAZolam (XANAX) 0.5 MG tablet Take 1 tablet (0.5 mg  total) by mouth at bedtime as needed for sleep. 30 tablet 0  . Calcium Carbonate-Vitamin D (CALCIUM-VITAMIN D) 500-200 MG-UNIT per tablet Take 1 tablet by mouth 2 (two) times daily with a meal.     . magnesium gluconate (MAGONATE) 500 MG tablet Take 250 mg by mouth daily.     Marland Kitchen omeprazole (PRILOSEC) 10 MG capsule Take 10 mg by mouth daily.    . ranitidine (ZANTAC) 150 MG capsule Take 150 mg by mouth 2 (two) times daily.     No facility-administered medications prior to visit.      Allergies: Patient has no known allergies.    PHYSICAL EXAM: VS:  BP 132/80 (BP Location: Right Arm, Patient Position: Sitting, Cuff Size: Normal)   Pulse 71   Ht 5\' 8"  (1.727 m)   Wt 157 lb 8 oz (71.4 kg)   BMI 23.95 kg/m  , Body mass index is 23.95 kg/m. Wt Readings from Last 3 Encounters:  04/04/17 157 lb 8 oz (71.4 kg)  03/20/17 157 lb (71.2 kg)  08/21/16 154 lb (69.9 kg)    GENERAL:  well developed, well nourished, obese, not in acute distress HEENT: normocephalic, pink conjunctivae, anicteric sclerae, no xanthelasma, normal dentition, oropharynx clear NECK:  no neck vein engorgement, JVP normal, no hepatojugular reflux, carotid upstroke brisk and symmetric, no bruit, no thyromegaly, no lymphadenopathy LUNGS:  good respiratory effort, clear  to auscultation bilaterally CV:  PMI not displaced, no thrills, no lifts, S1 and S2 within normal limits, no palpable S3 or S4, soft systolic murmur parasternal 2/6, non radiating, no rubs, no gallops ABD:  Soft, nontender, nondistended, normoactive bowel sounds, no abdominal aortic bruit, no hepatomegaly, no splenomegaly MS: nontender back, no kyphosis, no scoliosis, no joint deformities EXT:  2+ DP/PT pulses, no edema, no varicosities, no cyanosis, no clubbing SKIN: warm, nondiaphoretic, normal turgor, no ulcers NEUROPSYCH: alert, oriented to person, place, and time, sensory/motor grossly intact, normal mood, appropriate affect  Recent Labs: 03/20/2017: ALT 17;  BUN 16; Creatinine, Ser 0.86; Hemoglobin 15.2; Platelets 281.0; Potassium 4.8; Sodium 138; TSH 1.88   Lipid Panel    Component Value Date/Time   CHOL 191 01/22/2016 0835   TRIG 62.0 01/22/2016 0835   HDL 70.60 01/22/2016 0835   CHOLHDL 3 01/22/2016 0835   VLDL 12.4 01/22/2016 0835   LDLCALC 108 (H) 01/22/2016 0835     Other studies Reviewed:  EKG:  The ekg from 04/04/2017 was personally reviewed by me and it revealed SR, biatrial enlargement, PRWP.  Additional studies/ records that were reviewed personally reviewed by me today include: none available   ASSESSMENT AND PLAN: Palpitations Infrequent, non predictable rec Zio patch. May need to rpt if no episodes on first time. Rec echo  Systolic murmur Abn EKG rec echo   Current medicines are reviewed at length with the patient today.  The patient does not have concerns regarding medicines.  Labs/ tests ordered today include:  Orders Placed This Encounter  Procedures  . LONG TERM MONITOR (3-14 DAYS)  . EKG 12-Lead  . ECHOCARDIOGRAM COMPLETE    I had a lengthy and detailed discussion with the patient regarding diagnoses, prognosis, diagnostic options.  I counseled the patient on importance of lifestyle modification including heart healthy diet, regular physical activity.   Disposition:   FU with Cardiology after tests  Thank you for this consultation. We will forwarding this consultation to referring physician.   Signed, Wende Bushy, MD  04/04/2017 9:35 AM    [place]  This note was generated in part with voice recognition software and I apologize for any typographical errors that were not detected and corrected.

## 2017-04-04 ENCOUNTER — Ambulatory Visit (INDEPENDENT_AMBULATORY_CARE_PROVIDER_SITE_OTHER): Payer: PPO

## 2017-04-04 ENCOUNTER — Ambulatory Visit (INDEPENDENT_AMBULATORY_CARE_PROVIDER_SITE_OTHER): Payer: PPO | Admitting: Cardiology

## 2017-04-04 ENCOUNTER — Other Ambulatory Visit: Payer: Self-pay

## 2017-04-04 ENCOUNTER — Encounter: Payer: Self-pay | Admitting: Cardiology

## 2017-04-04 VITALS — BP 132/80 | HR 71 | Ht 68.0 in | Wt 157.5 lb

## 2017-04-04 DIAGNOSIS — R011 Cardiac murmur, unspecified: Secondary | ICD-10-CM

## 2017-04-04 DIAGNOSIS — R002 Palpitations: Secondary | ICD-10-CM

## 2017-04-04 DIAGNOSIS — R9431 Abnormal electrocardiogram [ECG] [EKG]: Secondary | ICD-10-CM

## 2017-04-04 NOTE — Patient Instructions (Addendum)
Testing/Procedures: Your physician has requested that you have an echocardiogram. Echocardiography is a painless test that uses sound waves to create images of your heart. It provides your doctor with information about the size and shape of your heart and how well your heart's chambers and valves are working. This procedure takes approximately one hour. There are no restrictions for this procedure.  Your physician has recommended that you wear an event monitor. Event monitors are medical devices that record the heart's electrical activity. Doctors most often [place] these monitors to diagnose arrhythmias. Arrhythmias are problems with the speed or rhythm of the heartbeat. The monitor is a small, portable device. You can wear one while you do your normal daily activities. This is usually used to diagnose what is causing palpitations/syncope (passing out).    Follow-Up: Your physician recommends that you schedule a follow-up appointment as needed. We will call you with results and if needed schedule follow up at that time.   It was a pleasure seeing you today here in the office. Please do not hesitate to give [place] a call back if you have any further questions. [place], BSN    Echocardiogram An echocardiogram, or echocardiography, uses sound waves (ultrasound) to produce an image of your heart. The echocardiogram is simple, painless, obtained within a short period of time, and offers valuable information to your health care provider. The images from an echocardiogram can provide information such as:  Evidence of coronary artery disease (CAD).  Heart size.  Heart muscle function.  Heart valve function.  Aneurysm detection.  Evidence of a past heart attack.  Fluid buildup around the heart.  Heart muscle thickening.  Assess heart valve function. Tell a health care provider about:  Any allergies you have.  All medicines you are taking, including vitamins, herbs, eye  drops, creams, and over-the-counter medicines.  Any problems you or family members have had with anesthetic medicines.  Any blood disorders you have.  Any surgeries you have had.  Any medical conditions you have.  Whether you are pregnant or may be pregnant. What happens before the procedure? No special preparation is needed. Eat and drink normally. What happens during the procedure?  In order to produce an image of your heart, gel will be applied to your chest and a wand-like tool (transducer) will be moved over your chest. The gel will help transmit the sound waves from the transducer. The sound waves will harmlessly bounce off your heart to allow the heart images to be captured in real-time motion. These images will then be recorded.  You may need an IV to receive a medicine that improves the quality of the pictures. What happens after the procedure? You may return to your normal schedule including diet, activities, and medicines, unless your health care provider tells you otherwise. This information is not intended to replace advice given to you by your health care provider. Make sure you discuss any questions you have with your health care provider. Document Released: 12/09/2000 Document Revised: 07/30/2016 Document Reviewed: 08/19/2013 Elsevier Interactive Patient Education  2017 [employer].  Cardiac Event Monitoring A cardiac event monitor is a small recording device that is used to detect abnormal heart rhythms (arrhythmias). The monitor is used to record your heart rhythm when you have symptoms, such as:  Fast heartbeats (palpitations), such as heart racing or fluttering.  Dizziness.  Fainting or light-headedness.  Unexplained weakness. Some monitors are wired to electrodes placed on your chest. Electrodes are flat, sticky  disks that attach to your skin. Other monitors may be hand-held or worn on the wrist. The monitor can be worn for up to 30 days. If the monitor is  attached to your chest, a technician will prepare your chest for the electrode placement and show you how to work the monitor. Take time to practice using the monitor before you leave the office. Make sure you understand how to send the information from the monitor to your health care provider. In some cases, you may need to use a landline telephone instead of a cell phone. What are the risks? Generally, this device is safe to use, but it possible that the skin under the electrodes will become irritated. How to use your cardiac event monitor  Wear your monitor at all times, except when you are in water:  Do not let the monitor get wet.  Take the monitor off when you bathe. Do not swim or use a hot tub with it on.  Keep your skin clean. Do not put body lotion or moisturizer on your chest.  Change the electrodes as told by your health care provider or any time they stop sticking to your skin. You may need to use medical tape to keep them on.  Try to put the electrodes in slightly different places on your chest to help prevent skin irritation. They must remain in the area under your left breast and in the upper right section of your chest.  Make sure the monitor is safely clipped to your clothing or in a location close to your body that your health care provider recommends.  Press the button to record as soon as you feel heart-related symptoms, such as:  Dizziness.  Weakness.  Light-headedness.  Palpitations.  Thumping or pounding in your chest.  Shortness of breath.  Unexplained weakness.  Keep a diary of your activities, such as walking, doing chores, and taking medicine. It is very important to note what you were doing when you pushed the button to record your symptoms. This will help your health care provider determine what might be contributing to your symptoms.  Send the recorded information as recommended by your health care provider. It may take some time for your health  care provider to process the results.  Change the batteries as told by your health care provider.  Keep electronic devices away from your monitor. This includes:  Tablets.  MP3 players.  Cell phones.  While wearing your monitor you should avoid:  Electric blankets.  Armed forces operational officer.  Electric toothbrushes.  Microwave ovens.  Magnets.  Metal detectors. Get help right away if:  You have chest pain.  You have extreme difficulty breathing or shortness of breath.  You develop a very fast heartbeat that persists.  You develop dizziness that does not go away.  You faint or constantly feel like you are about to faint. Summary  A cardiac event monitor is a small recording device that is used to help detect abnormal heart rhythms (arrhythmias).  The monitor is used to record your heart rhythm when you have heart-related symptoms.  Make sure you understand how to send the information from the monitor to your health care provider.  It is important to press the button on the monitor when you have any heart-related symptoms.  Keep a diary of your activities, such as walking, doing chores, and taking medicine. It is very important to note what you were doing when you pushed the button to record your symptoms. This will  help your health care provider learn what might be causing your symptoms. This information is not intended to replace advice given to you by your health care provider. Make sure you discuss any questions you have with your health care provider. Document Released: 09/20/2008 Document Revised: 11/26/2016 Document Reviewed: 11/26/2016 Elsevier Interactive Patient Education  2017 [employer].

## 2017-04-05 ENCOUNTER — Telehealth: Payer: Self-pay | Admitting: *Deleted

## 2017-04-05 DIAGNOSIS — R011 Cardiac murmur, unspecified: Secondary | ICD-10-CM

## 2017-04-05 LAB — ECHOCARDIOGRAM COMPLETE
HEIGHTINCHES: 68 in
Weight: 2520 oz

## 2017-04-05 NOTE — Telephone Encounter (Signed)
Reviewed echocardiogram results and recommendations with patient. Placed order in system for repeat echocardiogram in 1 year and reviewed blood pressure management and monitoring parameters with her. Also advised heart healthy diet with routine exercise. She verbalized understanding of our conversation, agreement with plan, and had no further questions at this time.

## 2017-04-05 NOTE — Telephone Encounter (Signed)
-----   Message from Wende Bushy, MD sent at 04/05/2017 10:07 AM EDT ----- LVEF ok Leaky mitral valve, moderate in amount --- rec BP monitoring and BP control, rec rpt echo in 1 year

## 2017-04-10 ENCOUNTER — Ambulatory Visit (INDEPENDENT_AMBULATORY_CARE_PROVIDER_SITE_OTHER): Payer: PPO | Admitting: Gastroenterology

## 2017-04-10 ENCOUNTER — Encounter: Payer: Self-pay | Admitting: Gastroenterology

## 2017-04-10 ENCOUNTER — Telehealth: Payer: Self-pay | Admitting: Cardiology

## 2017-04-10 ENCOUNTER — Other Ambulatory Visit
Admission: RE | Admit: 2017-04-10 | Discharge: 2017-04-10 | Disposition: A | Discharge status: Home / Self Care | Payer: PPO | Source: Ambulatory Visit | Attending: Gastroenterology | Admitting: Gastroenterology

## 2017-04-10 VITALS — BP 137/80 | HR 66 | Temp 97.9°F | Ht 68.0 in | Wt 154.0 lb

## 2017-04-10 DIAGNOSIS — K219 Gastro-esophageal reflux disease without esophagitis: Secondary | ICD-10-CM | POA: Diagnosis not present

## 2017-04-10 DIAGNOSIS — R140 Abdominal distension (gaseous): Secondary | ICD-10-CM | POA: Insufficient documentation

## 2017-04-10 MED ORDER — OMEPRAZOLE 40 MG PO CPDR: 40.0000 mg | DELAYED_RELEASE_CAPSULE | Freq: Every day | ORAL | 3 refills | Status: DC

## 2017-04-10 NOTE — Progress Notes (Signed)
Gastroenterology Consultation  Referring Provider:     Lucille Passy, MD Primary Care Physician:  Arnette Norris, MD Primary Gastroenterologist:  Dr. Jonathon Bellows  Reason for Consultation:     GERD        HPI:   Susan [name] is a 66 y.o. y/o female referred for consultation & management  by Dr. Arnette Norris, MD.    She has been referred for GERD. Hb 03/20/17 was normal at 15.2 grams and MCV 89. Fecal occult blood test in 01/2016 was negative.   Reflux:  Onset : "many years" since 90's, at times has got worse, last 6 months not changed  Symptoms: She describes her symptoms which began with palpitations followed by a lot of burping, , sleeps on pillows at night , if she lays on her side , feels her heart pounding, some chest discomfort. She says she saw a cardiologist for the same and is at present on a hotler monitor.  Recent weight gain: "weighs more than what I weighed 3 years back " gained 8 lbs  Medications: none  Narcotics or anticholinergics use : none  PPI /H2 blockers or Antacid  use and timing :Takes Ranitidine 1-2 times a day - helps but not completely , simethicone for gas Dinner time : 6.30 pm , goes to bed at 9 pm - in between stays upright   Prior EGD: last was in 2004- recalls she was told she had a hernia and some inflammation  Family history of esophageal cancer:no  Does not smoke  She also has gas and bloating since many years. Has a lot of abdominal distension. She consumes 1 diet soda a day , She has a bowel movement daily with psyllium, more bloating worse with onions and green peppers.      Past Medical History:  Diagnosis Date  . Gastric reflux 90's  . Hiatal hernia 2003    Past Surgical History:  Procedure Laterality Date  . APPENDECTOMY    . BREAST EXCISIONAL BIOPSY Right 2000   benign  . CHOLECYSTECTOMY    . TONSILLECTOMY     age 68  . UPPER GI ENDOSCOPY  2003    Prior to Admission medications   Medication Sig Start Date End Date Taking?  Authorizing Provider  ALPRAZolam Duanne Moron) 0.5 MG tablet Take 1 tablet (0.5 mg total) by mouth at bedtime as needed for sleep. 01/07/16  Yes Lucille Passy, MD  calcium carbonate (TUMS EX) 750 MG chewable tablet Chew 750 mg by mouth as needed.   Yes Historical Provider, MD  Calcium Carbonate-Vitamin D (CALCIUM-VITAMIN D) 500-200 MG-UNIT per tablet Take 1 tablet by mouth 2 (two) times daily with a meal.    Yes Historical Provider, MD  Fluticasone Propionate (KLS ALLER-FLO NA) Place 2 sprays into the nose daily.   Yes Historical Provider, MD  Lactobacillus (PROBIOTIC ACIDOPHILUS PO) Take by mouth daily.   Yes Historical Provider, MD  magnesium gluconate (MAGONATE) 500 MG tablet Take 250 mg by mouth daily.    Yes Historical Provider, MD  Multiple Vitamin (MULTI-VITAMINS) TABS Take by mouth.   Yes Historical Provider, MD  naproxen sodium (ANAPROX) 220 MG tablet Take 220 mg by mouth as needed.   Yes Historical Provider, MD  omeprazole (PRILOSEC) 10 MG capsule Take 10 mg by mouth daily.   Yes Historical Provider, MD  psyllium (TGT PSYLLIUM FIBER) 0.52 g capsule Take 0.52 g by mouth daily.   Yes Historical Provider, MD  ranitidine (ZANTAC) 150 MG  capsule Take 150 mg by mouth 2 (two) times daily.   Yes Historical Provider, MD  SIMETHICONE-80 PO Take 80 mg by mouth as needed.   Yes Historical Provider, MD    Family History  Problem Relation Age of Onset  . Hyperlipidemia Mother   . Hypertension Mother   . COPD Mother   . Heart failure Mother   . Stroke Father   . Hypertension Father   . Hypertension Sister   . Hypertension Brother   . Healthy Brother      Social History  Substance Use Topics  . Smoking status: Never Smoker  . Smokeless tobacco: Never Used  . Alcohol use 1.2 oz/week    2 Glasses of wine per week     Comment: daily    Allergies as of 04/10/2017  . (No Known Allergies)    Review of Systems:    All systems reviewed and negative except where noted in HPI.   Physical Exam:    BP 137/80   Pulse 66   Temp 97.9 F (36.6 C) (Oral)   Ht 5\' 8"  (1.727 m)   Wt 154 lb (69.9 kg)   BMI 23.42 kg/m  No LMP recorded. Patient is postmenopausal. Psych:  Alert and cooperative. Normal mood and affect. General:   Alert,  Well-developed, well-nourished, pleasant and cooperative in NAD Head:  Normocephalic and atraumatic. Eyes:  Sclera clear, no icterus.   Conjunctiva pink. Ears:  Normal auditory acuity. Nose:  No deformity, discharge, or lesions. Mouth:  No deformity or lesions,oropharynx pink & moist. Neck:  Supple; no masses or thyromegaly. Lungs:  Respirations even and unlabored.  Clear throughout to auscultation.   No wheezes, crackles, or rhonchi. No acute distress. Heart:  Regular rate and rhythm; no murmurs, clicks, rubs, or gallops. Abdomen:  Normal bowel sounds.  No bruits.  Soft, non-tender and non-distended without masses, hepatosplenomegaly or hernias noted.  No guarding or rebound tenderness.    Extremities:  No clubbing or edema.  No cyanosis. Neurologic:  Alert and oriented x3;  grossly normal neurologically. Psych:  Alert and cooperative. Normal mood and affect.  Imaging Studies: No results found.  Assessment and Plan:   Susan [name] is a 67 y.o. y/o female has been referred for reflux.   1. GERD: Counseled on life style changes, suggest to use PPI first thing in the morning on empty stomach and eat 30 minutes after. Advised on the use of a wedge pillow at night , avoid meals for 2 hours prior to bed time.  Discussed the risks and benefits of long term PPI use including but not limited to bone loss, chronic kidney disease, infections , low magnesium . Aim to use at the lowest dose for the shortest period of time   I will commence her on omeprazole 40 mg a day with aim to decrease dose at next visit  2. Bloating - advised to stop all sodas, LOW FODMAP diet and check for celiac disease  3. Palpitations- unlikely related to reflux and she is undergoing  cardiac evaluation.       Follow up in 8 weeks   Dr Jonathon Bellows MD

## 2017-04-10 NOTE — Telephone Encounter (Signed)
Request received from [facility] to provide clinical documentation that supports the order/prescription of the [place] service. Office visit note and EKG routed to attention Erick Blinks at fax [phone].

## 2017-04-12 LAB — CELIAC DISEASE PANEL
Endomysial Ab, IgA: NEGATIVE
IgA: 163 mg/dL (ref 87–352)
Tissue Transglutaminase Ab, IgA: <2 U/mL (ref 0–3)

## 2017-04-18 DIAGNOSIS — R002 Palpitations: Secondary | ICD-10-CM

## 2017-04-18 DIAGNOSIS — R9431 Abnormal electrocardiogram [ECG] [EKG]: Secondary | ICD-10-CM

## 2017-04-18 DIAGNOSIS — R011 Cardiac murmur, unspecified: Secondary | ICD-10-CM

## 2017-04-22 DIAGNOSIS — R002 Palpitations: Secondary | ICD-10-CM | POA: Diagnosis not present

## 2017-04-26 ENCOUNTER — Telehealth: Payer: Self-pay | Admitting: *Deleted

## 2017-04-26 MED ORDER — METOPROLOL SUCCINATE ER 25 MG PO TB24: 25.0000 mg | ORAL_TABLET | Freq: Every day | ORAL | 3 refills | Status: DC

## 2017-04-26 NOTE — Telephone Encounter (Signed)
Spoke with patient and she requested that I send prescription to [place] because they are going on a trip and need to get it today. Let her know that I sent that over and to call back if she has any further questions. She did get appointment scheduled with Dr. Yvone Neu to review results. She verbalized understanding of our conversation and had no further questions at this time.

## 2017-04-26 NOTE — Telephone Encounter (Signed)
Reviewed results and recommendations with patient. Instructed her to start Metoprolol (Toprol XL) 25 mg once daily. Also transferred her to schedule follow up appointment. She did have me spell medication for her to research. She verbalized understanding of our conversation, agreement with plan, and had no further questions at this time. Verified pharmacy to send medication.

## 2017-04-26 NOTE — Telephone Encounter (Signed)
Patient called and left voicemail message that she needs it sent to a different pharmacy. Called [facility] and had them cancel filling and will call patient to verify which she prefers.

## 2017-04-26 NOTE — Telephone Encounter (Signed)
Left voicemail message to call back for results.  

## 2017-04-26 NOTE — Telephone Encounter (Signed)
-----   Message from Wende Bushy, MD sent at 04/25/2017  3:52 PM EDT ----- Episodes of svt longest was < 1 min rec metoprolol xl 25mg  po qd and ffup in office She is fairly active with no symptoms with activity and therefore there was no indication for ischemia eval

## 2017-05-03 ENCOUNTER — Encounter: Payer: Self-pay | Admitting: Cardiology

## 2017-05-03 ENCOUNTER — Ambulatory Visit (INDEPENDENT_AMBULATORY_CARE_PROVIDER_SITE_OTHER): Payer: PPO | Admitting: Cardiology

## 2017-05-03 VITALS — BP 122/66 | HR 54 | Ht 68.0 in | Wt 156.5 lb

## 2017-05-03 DIAGNOSIS — I340 Nonrheumatic mitral (valve) insufficiency: Secondary | ICD-10-CM | POA: Diagnosis not present

## 2017-05-03 DIAGNOSIS — I471 Supraventricular tachycardia: Secondary | ICD-10-CM | POA: Diagnosis not present

## 2017-05-03 MED ORDER — METOPROLOL SUCCINATE ER 25 MG PO TB24: 12.5000 mg | ORAL_TABLET | Freq: Every day | ORAL | 3 refills | Status: DC

## 2017-05-03 NOTE — Progress Notes (Signed)
Cardiology Office Note   Date:  05/03/2017   ID:  Susan [name], DOB September 17, 1951, MRN [id number]  Referring Doctor:  Lucille Passy, MD   Cardiologist:   Wende Bushy, MD   Reason for consultation:  Chief Complaint  Patient presents with  . other    patient c/o heart rate being low. Meds reviewed verbally with patient.       History of Present Illness: Susan [name] is a 66 y.o. female who Presents for follow-up after testing   Review of records from last visit for 10 2018: 2 weeks ago, heart racing all of a sudden, assoc with burping. Checked with BP monitor --- HR 155. Infrequently, no known precipitating factors, mild-moderate in intensity, lasts minutes at a time, less than 70mins or so, resolving spontaneously  Since last visit, patient has started taking metoprolol. She has noticed decreased frequency of the palpitations. However, there is one time when when she felt unusually tired. She checked her blood pressure and her heart rate. Heart rate registered in the high 40s. She was a little bit more short of breath doing her usual exercise at that time. However, that resolved by itself, no recurrence of episodes. No chest pain or shortness of breath.  ROS:  Please see the history of present illness. Aside from mentioned under HPI, all other systems are reviewed and negative.    Past Medical History:  Diagnosis Date  . Gastric reflux 90's  . Hiatal hernia 2003    Past Surgical History:  Procedure Laterality Date  . APPENDECTOMY    . BREAST EXCISIONAL BIOPSY Right 2000   benign  . CHOLECYSTECTOMY    . TONSILLECTOMY     age 72  . UPPER GI ENDOSCOPY  2003     reports that she has never smoked. She has never used smokeless tobacco. She reports that she drinks about 1.2 oz of alcohol per week . She reports that she does not use drugs.   family history includes COPD in her mother; Healthy in her brother; Heart failure in her mother; Hyperlipidemia in her mother;  Hypertension in her brother, father, mother, and sister; Stroke in her father.   Outpatient Medications Prior to Visit  Medication Sig Dispense Refill  . ALPRAZolam (XANAX) 0.5 MG tablet Take 1 tablet (0.5 mg total) by mouth at bedtime as needed for sleep. 30 tablet 0  . calcium carbonate (TUMS EX) 750 MG chewable tablet Chew 750 mg by mouth as needed.    . Calcium Carbonate-Vitamin D (CALCIUM-VITAMIN D) 500-200 MG-UNIT per tablet Take 1 tablet by mouth 2 (two) times daily with a meal.     . Fluticasone Propionate (KLS ALLER-FLO NA) Place 2 sprays into the nose daily.    . Lactobacillus (PROBIOTIC ACIDOPHILUS PO) Take by mouth daily.    . magnesium gluconate (MAGONATE) 500 MG tablet Take 250 mg by mouth daily.     . Multiple Vitamin (MULTI-VITAMINS) TABS Take by mouth.    . naproxen sodium (ANAPROX) 220 MG tablet Take 220 mg by mouth as needed.    Marland Kitchen omeprazole (PRILOSEC) 40 MG capsule Take 1 capsule (40 mg total) by mouth daily. 90 capsule 3  . psyllium (TGT PSYLLIUM FIBER) 0.52 g capsule Take 0.52 g by mouth daily.    . ranitidine (ZANTAC) 150 MG capsule Take 150 mg by mouth 2 (two) times daily.    Marland Kitchen SIMETHICONE-80 PO Take 80 mg by mouth as needed.    . metoprolol succinate (TOPROL XL)  25 MG 24 hr tablet Take 1 tablet (25 mg total) by mouth daily. 30 tablet 3   No facility-administered medications prior to visit.      Allergies: Patient has no known allergies.    PHYSICAL EXAM: VS:  BP 122/66 (BP Location: Left Arm, Patient Position: Sitting, Cuff Size: Normal)   Pulse (!) 54   Ht 5\' 8"  (1.727 m)   Wt 156 lb 8 oz (71 kg)   BMI 23.80 kg/m  , Body mass index is 23.8 kg/m. Wt Readings from Last 3 Encounters:  05/03/17 156 lb 8 oz (71 kg)  04/10/17 154 lb (69.9 kg)  04/04/17 157 lb 8 oz (71.4 kg)   PHYSICAL EXAM: VS:  BP 122/66 (BP Location: Left Arm, Patient Position: Sitting, Cuff Size: Normal)   Pulse (!) 54   Ht 5\' 8"  (1.727 m)   Wt 156 lb 8 oz (71 kg)   BMI 23.80 kg/m  ,  BMI Body mass index is 23.8 kg/m. GENERAL:  well developed, well nourished, not in acute distress HEENT: normocephalic, pink conjunctivae, anicteric sclerae, no xanthelasma, normal dentition, oropharynx clear NECK:  no neck vein engorgement, JVP normal, no hepatojugular reflux, carotid upstroke brisk and symmetric, no bruit, no thyromegaly, no lymphadenopathy LUNGS:  good respiratory effort, clear to auscultation bilaterally CV:  PMI not displaced, no thrills, no lifts, S1 and S2 within normal limits, no palpable S3 or S4, very soft systolic murmur, no rubs, no gallops ABD:  Soft, nontender, nondistended, normoactive bowel sounds, no abdominal aortic bruit, no hepatomegaly, no splenomegaly MS: nontender back, no kyphosis, no scoliosis, no joint deformities EXT:  2+ DP/PT pulses, no edema, no varicosities, no cyanosis, no clubbing SKIN: warm, nondiaphoretic, normal turgor, no ulcers NEUROPSYCH: alert, oriented to person, place, and time, sensory/motor grossly intact, normal mood, appropriate affect  Recent Labs: 03/20/2017: ALT 17; BUN 16; Creatinine, Ser 0.86; Hemoglobin 15.2; Platelets 281.0; Potassium 4.8; Sodium 138; TSH 1.88   Lipid Panel    Component Value Date/Time   CHOL 191 01/22/2016 0835   TRIG 62.0 01/22/2016 0835   HDL 70.60 01/22/2016 0835   CHOLHDL 3 01/22/2016 0835   VLDL 12.4 01/22/2016 0835   LDLCALC 108 (H) 01/22/2016 0835     Other studies Reviewed:  EKG:  The ekg from 04/04/2017 was personally reviewed by me and it revealed SR, biatrial enlargement, PRWP.  Additional studies/ records that were reviewed personally reviewed by me today include:  Echo 04/04/2017: Left ventricle: The cavity size was normal. Wall thickness was   normal. Systolic function was normal. The estimated ejection   fraction was in the range of 55% to 60%. Wall motion was normal;   there were no regional wall motion abnormalities. Left   ventricular diastolic function parameters were  normal. - Mitral valve: Mildly thickened leaflets . There was moderate   regurgitation. - Tricuspid valve: There was mild regurgitation. - Pulmonary arteries: Systolic pressure was within the normal   range.  Long-term monitor 04/25/2017: Overall rhythm was sinus. Heart rate ranged from 41- 01/14/1959 bpm, average of 66 BPM.  One run of slightly wide complex tachycardia, 8 beats at average of 179 BPM. This is possibly ventricular tachycardia or SVT with aberrancy. No symptom documented.  10 episodes of supraventricular tachycardia. Fastest was 16 seconds at 148 BPM. The longest was 42.8 seconds at 138 BPM. One was with a documented symptom.  No evidence of significant pauses or AV block, or atrial fibrillation.  9 triggered events or diary  entries corresponded to SVT, sinus rhythm, PACs or PVCs.  Supraventricular ectopy: Occasional isolated PACs, rare atrial couplets, rare atrial triplets. PACs corresponded to 2.6% of the total number of beats.  Ventricular ectopy: Rare isolated PVCs, rare ventricular couplets.    ASSESSMENT AND PLAN: Palpitations SVT noted on monitor, 10 episodes, longest was 42.8 seconds at 138 BPM LVEF normal Patient reports it decreased frequency of palpitations with metoprolol. To address the bradycardia, Recommend to continue metoprolol XL, try a lower dose of half a tablet of the 25 mg daily. Patient in agreement. Continue to monitor for symptoms.   Systolic murmur Noted to have moderate MR on echo Recommend blood pressure monitoring, serial evaluation, likely echo in 1 year   Current medicines are reviewed at length with the patient today.  The patient does not have concerns regarding medicines.  Labs/ tests ordered today include:  Orders Placed This Encounter  Procedures  . EKG 12-Lead    I had a lengthy and detailed discussion with the patient regarding diagnoses, prognosis, diagnostic options.  I counseled the patient on importance of  lifestyle modification including heart healthy diet, regular physical activity.   Disposition:   FU with Cardiology In 6 months  I spent at least 25 minutes with the patient today and more than 50% of the time was spent counseling the patient and coordinating care.       Signed, Wende Bushy, MD  05/03/2017 10:42 AM    [place]  This note was generated in part with voice recognition software and I apologize for any typographical errors that were not detected and corrected.

## 2017-05-03 NOTE — Patient Instructions (Addendum)
Medication Instructions:  Please cut your metoprolol pill in 1/2 daily  Labwork: None  Testing/Procedures: None  Follow-Up: Your physician wants you to follow-up in: 6 months  You will receive a reminder letter in the mail two months in advance.  If you don't receive a letter, please call our office to schedule the follow-up appointment.  If you need a refill on your cardiac medications before your next appointment, please call your pharmacy.

## 2017-05-11 DIAGNOSIS — H348322 Tributary (branch) retinal vein occlusion, left eye, stable: Secondary | ICD-10-CM | POA: Diagnosis not present

## 2017-05-29 ENCOUNTER — Ambulatory Visit (INDEPENDENT_AMBULATORY_CARE_PROVIDER_SITE_OTHER): Payer: PPO

## 2017-05-29 VITALS — BP 110/78 | HR 61 | Temp 97.9°F | Ht 67.5 in | Wt 153.5 lb

## 2017-05-29 DIAGNOSIS — E7800 Pure hypercholesterolemia, unspecified: Secondary | ICD-10-CM | POA: Diagnosis not present

## 2017-05-29 DIAGNOSIS — I10 Essential (primary) hypertension: Secondary | ICD-10-CM | POA: Diagnosis not present

## 2017-05-29 DIAGNOSIS — I781 Nevus, non-neoplastic: Secondary | ICD-10-CM | POA: Diagnosis not present

## 2017-05-29 DIAGNOSIS — Z85828 Personal history of other malignant neoplasm of skin: Secondary | ICD-10-CM | POA: Diagnosis not present

## 2017-05-29 DIAGNOSIS — L718 Other rosacea: Secondary | ICD-10-CM | POA: Diagnosis not present

## 2017-05-29 DIAGNOSIS — D1800 Hemangioma unspecified site: Secondary | ICD-10-CM | POA: Diagnosis not present

## 2017-05-29 DIAGNOSIS — L859 Epidermal thickening, unspecified: Secondary | ICD-10-CM | POA: Diagnosis not present

## 2017-05-29 DIAGNOSIS — Z0000 Encounter for general adult medical examination without abnormal findings: Secondary | ICD-10-CM | POA: Diagnosis not present

## 2017-05-29 DIAGNOSIS — Z1283 Encounter for screening for malignant neoplasm of skin: Secondary | ICD-10-CM | POA: Diagnosis not present

## 2017-05-29 DIAGNOSIS — D485 Neoplasm of uncertain behavior of skin: Secondary | ICD-10-CM | POA: Diagnosis not present

## 2017-05-29 DIAGNOSIS — Z23 Encounter for immunization: Secondary | ICD-10-CM | POA: Diagnosis not present

## 2017-05-29 DIAGNOSIS — I8393 Asymptomatic varicose veins of bilateral lower extremities: Secondary | ICD-10-CM | POA: Diagnosis not present

## 2017-05-29 DIAGNOSIS — L821 Other seborrheic keratosis: Secondary | ICD-10-CM | POA: Diagnosis not present

## 2017-05-29 DIAGNOSIS — L820 Inflamed seborrheic keratosis: Secondary | ICD-10-CM | POA: Diagnosis not present

## 2017-05-29 LAB — COMPREHENSIVE METABOLIC PANEL
ALT: 17 U/L (ref 0–35)
AST: 20 U/L (ref 0–37)
Albumin: 4.5 g/dL (ref 3.5–5.2)
Alkaline Phosphatase: 69 U/L (ref 39–117)
BILIRUBIN TOTAL: 0.9 mg/dL (ref 0.2–1.2)
BUN: 18 mg/dL (ref 6–23)
CO2: 28 meq/L (ref 19–32)
Calcium: 10 mg/dL (ref 8.4–10.5)
Chloride: 101 mEq/L (ref 96–112)
Creatinine, Ser: 0.91 mg/dL (ref 0.40–1.20)
GFR: 65.57 mL/min (ref >60.00)
GLUCOSE: 103 mg/dL — AB (ref 70–99)
POTASSIUM: 5 meq/L (ref 3.5–5.1)
SODIUM: 137 meq/L (ref 135–145)
Total Protein: 7 g/dL (ref 6.0–8.3)

## 2017-05-29 LAB — LIPID PANEL
CHOL/HDL RATIO: 3
Cholesterol: 193 mg/dL (ref 0–200)
HDL: 63.4 mg/dL (ref >39.00)
LDL Cholesterol: 112 mg/dL — ABNORMAL HIGH (ref 0–99)
NONHDL: 129.9
Triglycerides: 88 mg/dL (ref 0.0–149.0)
VLDL: 17.6 mg/dL (ref 0.0–40.0)

## 2017-05-29 LAB — CBC WITH DIFFERENTIAL/PLATELET
BASOS PCT: 1.3 % (ref 0.0–3.0)
Basophils Absolute: 0.1 10*3/uL (ref 0.0–0.1)
EOS PCT: 2.1 % (ref 0.0–5.0)
Eosinophils Absolute: 0.1 10*3/uL (ref 0.0–0.7)
HCT: 44.6 % (ref 36.0–46.0)
HEMOGLOBIN: 14.8 g/dL (ref 12.0–15.0)
LYMPHS PCT: 23.5 % (ref 12.0–46.0)
Lymphs Abs: 1.4 10*3/uL (ref 0.7–4.0)
MCHC: 33.3 g/dL (ref 30.0–36.0)
MCV: 88 fl (ref 78.0–100.0)
Monocytes Absolute: 0.6 10*3/uL (ref 0.1–1.0)
Monocytes Relative: 9.9 % (ref 3.0–12.0)
NEUTROS PCT: 63.2 % (ref 43.0–77.0)
Neutro Abs: 3.9 10*3/uL (ref 1.4–7.7)
PLATELETS: 281 10*3/uL (ref 150.0–400.0)
RBC: 5.06 Mil/uL (ref 3.87–5.11)
RDW: 13.2 % (ref 11.5–15.5)
WBC: 6.2 10*3/uL (ref 4.0–10.5)

## 2017-05-29 LAB — TSH: TSH: 1.96 u[IU]/mL (ref 0.35–4.50)

## 2017-05-29 NOTE — Patient Instructions (Signed)
Susan [name] , Thank you for taking time to come for your Medicare Wellness Visit. I appreciate your ongoing commitment to your health goals. Please review the following plan we discussed and let me know if I can assist you in the future.   These are the goals we discussed: Goals    . Increase physical activity          Starting 05/29/17, I will continue to exercise for 45-60 min daily.        This is a list of the screening recommended for you and due dates:  Health Maintenance  Topic Date Due  . Stool Blood Test  02/02/2018*  .  Hepatitis C: One time screening is recommended by Center for Disease Control  (CDC) for  adults born from [birth year] through 1966.   01/24/2019*  . Flu Shot  07/26/2017  . Mammogram  02/09/2018  . Tetanus Vaccine  12/03/2018  . DEXA scan (bone density measurement)  Completed  . Pneumonia vaccines  Completed  *Topic was postponed. The date shown is not the original due date.   Preventive Care for Adults  A healthy lifestyle and preventive care can promote health and wellness. Preventive health guidelines for adults include the following key practices.  . A routine yearly physical is a good way to check with your health care provider about your health and preventive screening. It is a chance to share any concerns and updates on your health and to receive a thorough exam.  . Visit your dentist for a routine exam and preventive care every 6 months. Brush your teeth twice a day and floss once a day. Good oral hygiene prevents tooth decay and gum disease.  . The frequency of eye exams is based on your age, health, family medical history, use  of contact lenses, and other factors. Follow your health care provider's ecommendations for frequency of eye exams.  . Eat a healthy diet. Foods like vegetables, fruits, whole grains, low-fat dairy products, and lean protein foods contain the nutrients you need without too many calories. Decrease your intake of foods high in  solid fats, added sugars, and salt. Eat the right amount of calories for you. Get information about a proper diet from your health care provider, if necessary.  . Regular physical exercise is one of the most important things you can do for your health. Most adults should get at least 150 minutes of moderate-intensity exercise (any activity that increases your heart rate and causes you to sweat) each week. In addition, most adults need muscle-strengthening exercises on 2 or more days a week.  Silver Sneakers may be a benefit available to you. To determine eligibility, you may visit the website: www.silversneakers.com or contact program at (972)517-2880 Mon-Fri between 8AM-8PM.   . Maintain a healthy weight. The body mass index (BMI) is a screening tool to identify possible weight problems. It provides an estimate of body fat based on height and weight. Your health care provider can find your BMI and can help you achieve or maintain a healthy weight.   For adults 20 years and older: ? A BMI below 18.5 is considered underweight. ? A BMI of 18.5 to 24.9 is normal. ? A BMI of 25 to 29.9 is considered overweight. ? A BMI of 30 and above is considered obese.   . Maintain normal blood lipids and cholesterol levels by exercising and minimizing your intake of saturated fat. Eat a balanced diet with plenty of fruit and vegetables. Blood tests  for lipids and cholesterol should begin at age 35 and be repeated every 5 years. If your lipid or cholesterol levels are high, you are over 50, or you are at high risk for heart disease, you may need your cholesterol levels checked more frequently. Ongoing high lipid and cholesterol levels should be treated with medicines if diet and exercise are not working.  . If you smoke, find out from your health care provider how to quit. If you do not use tobacco, please do not start.  . If you choose to drink alcohol, please do not consume more than 2 drinks per day. One drink  is considered to be 12 ounces (355 mL) of beer, 5 ounces (148 mL) of wine, or 1.5 ounces (44 mL) of liquor.  . If you are 82-55 years old, ask your health care provider if you should take aspirin to prevent strokes.  . Use sunscreen. Apply sunscreen liberally and repeatedly throughout the day. You should seek shade when your shadow is shorter than you. Protect yourself by wearing long sleeves, pants, a wide-brimmed hat, and sunglasses year round, whenever you are outdoors.  . Once a month, do a whole body skin exam, using a mirror to look at the skin on your back. Tell your health care provider of new moles, moles that have irregular borders, moles that are larger than a pencil eraser, or moles that have changed in shape or color.

## 2017-05-29 NOTE — Progress Notes (Signed)
PCP notes:   Health maintenance:  PPSV23 - administered Colon cancer screening - PCP please discuss with pt at next appt  Abnormal screenings:   None  Patient concerns:   None  Nurse concerns:  None  Next PCP appt:   06/05/17 @ 0830

## 2017-05-29 NOTE — Progress Notes (Signed)
Pre visit review using our clinic review tool, if applicable. No additional management support is needed unless otherwise documented below in the visit note. 

## 2017-05-29 NOTE — Progress Notes (Signed)
Subjective:   Susan [name] is a 66 y.o. female who presents for Medicare Annual (Subsequent) preventive examination.  Review of Systems:  N/A Cardiac Risk Factors include: advanced age (>55men, >40 women);hypertension;dyslipidemia     Objective:     Vitals: BP 110/78 (BP Location: Right Arm, Patient Position: Sitting, Cuff Size: Normal)   Pulse 61   Temp 97.9 F (36.6 C) (Oral)   Ht 5' 7.5" (1.715 m) Comment: no shoes  Wt 153 lb 8 oz (69.6 kg)   SpO2 96%   BMI 23.69 kg/m   Body mass index is 23.69 kg/m.   Tobacco History  Smoking Status  . Never Smoker  Smokeless Tobacco  . Never Used     Counseling given: No   Past Medical History:  Diagnosis Date  . Gastric reflux 90's  . Hiatal hernia 2003   Past Surgical History:  Procedure Laterality Date  . APPENDECTOMY    . BREAST EXCISIONAL BIOPSY Right 2000   benign  . CHOLECYSTECTOMY    . TONSILLECTOMY     age 14  . UPPER GI ENDOSCOPY  2003   Family History  Problem Relation Age of Onset  . Hyperlipidemia Mother   . Hypertension Mother   . COPD Mother   . Heart failure Mother   . Stroke Father   . Hypertension Father   . Hypertension Sister   . Hypertension Brother   . Healthy Brother    History  Sexual Activity  . Sexual activity: Not on file    Outpatient Encounter Prescriptions as of 05/29/2017  Medication Sig  . ALPRAZolam (XANAX) 0.5 MG tablet Take 1 tablet (0.5 mg total) by mouth at bedtime as needed for sleep.  . calcium carbonate (TUMS EX) 750 MG chewable tablet Chew 750 mg by mouth as needed.  . Calcium Carbonate-Vitamin D (CALCIUM-VITAMIN D) 500-200 MG-UNIT per tablet Take 1 tablet by mouth 2 (two) times daily with a meal.   . Fluticasone Propionate (KLS ALLER-FLO NA) Place 2 sprays into the nose daily.  . Lactobacillus (PROBIOTIC ACIDOPHILUS PO) Take by mouth daily.  . magnesium gluconate (MAGONATE) 500 MG tablet Take 250 mg by mouth daily.   . metoprolol succinate (TOPROL XL) 25 MG 24  hr tablet Take 0.5 tablets (12.5 mg total) by mouth daily.  . Multiple Vitamin (MULTI-VITAMINS) TABS Take by mouth.  . naproxen sodium (ANAPROX) 220 MG tablet Take 220 mg by mouth as needed.  Marland Kitchen omeprazole (PRILOSEC) 40 MG capsule Take 1 capsule (40 mg total) by mouth daily.  . psyllium (TGT PSYLLIUM FIBER) 0.52 g capsule Take 0.52 g by mouth daily.  . ranitidine (ZANTAC) 150 MG capsule Take 150 mg by mouth 2 (two) times daily.  Marland Kitchen SIMETHICONE-80 PO Take 80 mg by mouth as needed.   No facility-administered encounter medications on file as of 05/29/2017.     Activities of Daily Living In your present state of health, do you have any difficulty performing the following activities: 05/29/2017  Hearing? N  Vision? N  Difficulty concentrating or making decisions? N  Walking or climbing stairs? N  Dressing or bathing? N  Doing errands, shopping? N  Preparing Food and eating ? N  Using the Toilet? N  In the past six months, have you accidently leaked urine? N  Do you have problems with loss of bowel control? N  Managing your Medications? N  Managing your Finances? N  Housekeeping or managing your Housekeeping? N  Some recent data might be hidden  Patient Care Team: Lucille Passy, MD as PCP - General Lacinda Axon Tedra Coupe, MD as Referring Physician (Dermatology)    Assessment:     Hearing Screening   125Hz  250Hz  500Hz  1000Hz  2000Hz  3000Hz  4000Hz  6000Hz  8000Hz   Right ear:   40 40 40  40    Left ear:   40 40 40  40    Vision Screening Comments: Last vision exam in Feb 2018 with Dr. Wallace Going   Exercise Activities and Dietary recommendations Current Exercise Habits: Home exercise routine, Type of exercise: Other - see comments;treadmill;walking (recumbent bike), Time (Minutes): 60, Frequency (Times/Week): 5, Weekly Exercise (Minutes/Week): 300, Intensity: Moderate, Exercise limited by: None identified  Goals    . Increase physical activity          Starting 05/29/17, I will continue  to exercise for 45-60 min daily.       Fall Risk Fall Risk  05/29/2017 01/25/2016  Falls in the past year? No No   Depression Screen PHQ 2/9 Scores 05/29/2017 01/25/2016  PHQ - 2 Score 0 0     Cognitive Function MMSE - Mini Mental State Exam 05/29/2017  Orientation to time 5  Orientation to Place 5  Registration 3  Attention/ Calculation 0  Recall 3  Language- name 2 objects 0  Language- repeat 1  Language- follow 3 step command 3  Language- read & follow direction 0  Write a sentence 0  Copy design 0  Total score 20   PLEASE NOTE: A Mini-Cog screen was completed. Maximum score is 20. A value of 0 denotes this part of Folstein MMSE was not completed or the patient failed this part of the Mini-Cog screening.   Mini-Cog Screening Orientation to Time - Max 5 pts Orientation to Place - Max 5 pts Registration - Max 3 pts Recall - Max 3 pts Language Repeat - Max 1 pts Language Follow 3 Step Command - Max 3 pts     Immunization History  Administered Date(s) Administered  . Influenza,inj,Quad PF,36+ Mos 10/02/2013, 09/23/2015, 09/16/2016  . Pneumococcal Conjugate-13 01/25/2016  . Td 12/03/2008  . Zoster 09/29/2015   Screening Tests Health Maintenance  Topic Date Due  . COLON CANCER SCREENING ANNUAL FOBT  02/02/2018 (Originally 02/03/2017)  . Hepatitis C Screening  01/24/2019 (Originally 1951-08-25)  . INFLUENZA VACCINE  07/26/2017  . MAMMOGRAM  02/09/2018  . TETANUS/TDAP  12/03/2018  . DEXA SCAN  Completed  . PNA vac Low Risk Adult  Completed      Plan:     I have personally reviewed and addressed the Medicare Annual Wellness questionnaire and have noted the following in the patient's chart:  A. Medical and social history B. Use of alcohol, tobacco or illicit drugs  C. Current medications and supplements D. Functional ability and status E.  Nutritional status F.  Physical activity G. Advance directives H. List of other physicians I.  Hospitalizations, surgeries, and  ER visits in previous 12 months J.  [place] to include hearing, vision, cognitive, depression L. Referrals and appointments - none  In addition, I have reviewed and discussed with patient certain preventive protocols, quality metrics, and best practice recommendations. A written personalized care plan for preventive services as well as general preventive health recommendations were provided to patient.  See attached scanned questionnaire for additional information.   Signed,   Lindell Noe, MHA, BS, LPN Health Coach

## 2017-05-30 NOTE — Progress Notes (Signed)
I reviewed health advisor's note, was available for consultation, and agree with documentation and plan.  

## 2017-05-31 ENCOUNTER — Other Ambulatory Visit: Payer: Self-pay | Admitting: Family Medicine

## 2017-05-31 NOTE — Telephone Encounter (Signed)
Last refill 01/07/16, last OV 05/29/17

## 2017-05-31 NOTE — Telephone Encounter (Signed)
Faxed to  [facility] - [place], [place] - [address]: [phone]  

## 2017-06-05 ENCOUNTER — Encounter: Payer: Self-pay | Admitting: Family Medicine

## 2017-06-05 ENCOUNTER — Ambulatory Visit (INDEPENDENT_AMBULATORY_CARE_PROVIDER_SITE_OTHER): Payer: PPO | Admitting: Family Medicine

## 2017-06-05 VITALS — BP 120/78 | HR 68 | Ht 68.0 in | Wt 153.0 lb

## 2017-06-05 DIAGNOSIS — Z01419 Encounter for gynecological examination (general) (routine) without abnormal findings: Secondary | ICD-10-CM

## 2017-06-05 DIAGNOSIS — G4700 Insomnia, unspecified: Secondary | ICD-10-CM | POA: Diagnosis not present

## 2017-06-05 DIAGNOSIS — Z0000 Encounter for general adult medical examination without abnormal findings: Secondary | ICD-10-CM

## 2017-06-05 DIAGNOSIS — K219 Gastro-esophageal reflux disease without esophagitis: Secondary | ICD-10-CM | POA: Diagnosis not present

## 2017-06-05 DIAGNOSIS — I10 Essential (primary) hypertension: Secondary | ICD-10-CM | POA: Diagnosis not present

## 2017-06-05 NOTE — Progress Notes (Signed)
Subjective:   Patient ID: Susan [name], female    DOB: September 24, 1951, 66 y.o.   MRN: [id number]  Susan [name] is a pleasant 66 y.o. year old female who presents to clinic today with Annual Exam  on 06/05/2017  HPI:  Saw Candis Musa, RN on 05/29/17 for annual medicare visit. Not reviewed.   Zoster 09/29/15 Td 12/03/08 Influenza vaccine 09/23/15 Mammogram 01/2016 Neg IFOB 02/04/16  Takes rare xanax for insomnia.  HTN- has been well controlled Toprol XL (also helps with SVT).  Lab Results  Component Value Date   CHOL 193 05/29/2017   HDL 63.40 05/29/2017   LDLCALC 112 (H) 05/29/2017   TRIG 88.0 05/29/2017   CHOLHDL 3 05/29/2017   Lab Results  Component Value Date   CREATININE 0.91 05/29/2017   Lab Results  Component Value Date   TSH 1.96 05/29/2017   Lab Results  Component Value Date   WBC 6.2 05/29/2017   HGB 14.8 05/29/2017   HCT 44.6 05/29/2017   MCV 88.0 05/29/2017   PLT 281.0 05/29/2017   Current Outpatient Prescriptions on File Prior to Visit  Medication Sig Dispense Refill  . ALPRAZolam (XANAX) 0.5 MG tablet TAKE 1 TABLET BY MOUTH EVERY NIGHT AT BEDTIME AS NEEDED. FOR SLEEP 30 tablet 0  . calcium carbonate (TUMS EX) 750 MG chewable tablet Chew 750 mg by mouth as needed.    . Calcium Carbonate-Vitamin D (CALCIUM-VITAMIN D) 500-200 MG-UNIT per tablet Take 1 tablet by mouth 2 (two) times daily with a meal.     . Fluticasone Propionate (KLS ALLER-FLO NA) Place 2 sprays into the nose daily.    . Lactobacillus (PROBIOTIC ACIDOPHILUS PO) Take by mouth daily.    . magnesium gluconate (MAGONATE) 500 MG tablet Take 250 mg by mouth daily.     . metoprolol succinate (TOPROL XL) 25 MG 24 hr tablet Take 0.5 tablets (12.5 mg total) by mouth daily. 30 tablet 3  . Multiple Vitamin (MULTI-VITAMINS) TABS Take by mouth.    . naproxen sodium (ANAPROX) 220 MG tablet Take 220 mg by mouth as needed.    Marland Kitchen omeprazole (PRILOSEC) 40 MG capsule Take 1 capsule (40 mg total) by mouth  daily. 90 capsule 3  . psyllium (TGT PSYLLIUM FIBER) 0.52 g capsule Take 0.52 g by mouth daily.    . ranitidine (ZANTAC) 150 MG capsule Take 150 mg by mouth 2 (two) times daily.    Marland Kitchen SIMETHICONE-80 PO Take 80 mg by mouth as needed.     No current facility-administered medications on file prior to visit.     No Known Allergies  Past Medical History:  Diagnosis Date  . Gastric reflux 90's  . Hiatal hernia 2003    Past Surgical History:  Procedure Laterality Date  . APPENDECTOMY    . BREAST EXCISIONAL BIOPSY Right 2000   benign  . CHOLECYSTECTOMY    . TONSILLECTOMY     age 33  . UPPER GI ENDOSCOPY  2003    Family History  Problem Relation Age of Onset  . Hyperlipidemia Mother   . Hypertension Mother   . COPD Mother   . Heart failure Mother   . Stroke Father   . Hypertension Father   . Hypertension Sister   . Hypertension Brother   . Healthy Brother     Social History   Social History  . Marital status: Single    Spouse name: N/A  . Number of children: N/A  . Years of education: N/A  Occupational History  . Not on file.   Social History Main Topics  . Smoking status: Never Smoker  . Smokeless tobacco: Never Used  . Alcohol use 1.2 oz/week    2 Glasses of wine per week     Comment: daily  . Drug use: No  . Sexual activity: Not on file   Other Topics Concern  . Not on file   Social History Narrative   She does have a living will.   Desires CPR   The PMH, PSH, Social History, Family History, Medications, and allergies have been reviewed in [facility], and have been updated if relevant.  Review of Systems  Constitutional: Negative.   HENT: Negative.   Eyes: Negative.   Respiratory: Negative.   Cardiovascular: Negative.   Gastrointestinal: Negative.   Endocrine: Negative.   Genitourinary: Negative.   Musculoskeletal: Negative.   Skin: Negative.   Allergic/Immunologic: Negative.   Neurological: Negative.   Hematological: Negative.     Psychiatric/Behavioral: Negative.   All other systems reviewed and are negative.      Objective:    BP 120/78   Pulse 68   Ht 5\' 8"  (1.727 m)   Wt 153 lb (69.4 kg)   SpO2 98%   BMI 23.26 kg/m    Physical Exam  Constitutional: She is oriented to person, place, and time. She appears well-developed and well-nourished. No distress.  HENT:  Head: Normocephalic and atraumatic.  Eyes: Conjunctivae are normal.  Neck: Normal range of motion.  Cardiovascular: Normal rate and regular rhythm.   Pulmonary/Chest: Effort normal and breath sounds normal. Right breast exhibits no inverted nipple, no mass, no nipple discharge, no skin change and no tenderness. Left breast exhibits no inverted nipple, no mass, no nipple discharge, no skin change and no tenderness. Breasts are symmetrical.  Musculoskeletal: Normal range of motion. She exhibits no edema.  Neurological: She is alert and oriented to person, place, and time. No cranial nerve deficit. Coordination normal.  Skin: Skin is warm and dry. She is not diaphoretic.  Psychiatric: She has a normal mood and affect. Her behavior is normal. Judgment and thought content normal.  Nursing note and vitals reviewed.           Assessment & Plan:   Essential hypertension  Gastroesophageal reflux disease, esophagitis presence not specified  Well woman exam  Insomnia, unspecified type No Follow-up on file.

## 2017-06-05 NOTE — Assessment & Plan Note (Addendum)
Reviewed preventive care protocols, scheduled due services, and updated immunizations Discussed nutrition, exercise, diet, and healthy lifestyle.  cologuard ordered. 

## 2017-06-05 NOTE — Assessment & Plan Note (Signed)
Well controlled with rare xanax.

## 2017-06-05 NOTE — Progress Notes (Signed)
Pre visit review using our clinic review tool, if applicable. No additional management support is needed unless otherwise documented below in the visit note. 

## 2017-06-05 NOTE — Assessment & Plan Note (Signed)
Well controlled.  No changes made. 

## 2017-06-05 NOTE — Patient Instructions (Signed)
Great to see you. Have a great trip to [place]!

## 2017-06-12 ENCOUNTER — Encounter: Payer: Self-pay | Admitting: Gastroenterology

## 2017-06-12 ENCOUNTER — Ambulatory Visit (INDEPENDENT_AMBULATORY_CARE_PROVIDER_SITE_OTHER): Payer: PPO | Admitting: Gastroenterology

## 2017-06-12 VITALS — BP 112/95 | HR 56 | Temp 97.8°F | Ht 68.0 in | Wt 155.2 lb

## 2017-06-12 DIAGNOSIS — K219 Gastro-esophageal reflux disease without esophagitis: Secondary | ICD-10-CM | POA: Diagnosis not present

## 2017-06-12 DIAGNOSIS — R140 Abdominal distension (gaseous): Secondary | ICD-10-CM

## 2017-06-12 MED ORDER — OMEPRAZOLE 20 MG PO CPDR: 20.0000 mg | DELAYED_RELEASE_CAPSULE | Freq: Every day | ORAL | 3 refills | Status: DC

## 2017-06-12 NOTE — Progress Notes (Signed)
Susan Bellows MD, MRCP(U.K) [address]  [place]  [place], [place] [address]  Main: [phone]  Fax: [phone]   Primary Care Physician: Lucille Passy, MD  Primary Gastroenterologist:  Dr. Jonathon [name]   No chief complaint on file.   HPI: Susan [name] is a 66 y.o. female .She is here today to follow up for GERD and bloating   Summary of history :  Ongoing for >15 years , was getting worse last 6 months , recent weight gain .She also has gas and bloating since many years. Has a lot of abdominal distension. She consumes 1 diet soda a day , She has a bowel movement daily with psyllium, more bloating worse with onions and green peppers.    Interval history   04/10/2017-  06/12/2017   Commenced on Omeprazole 40 mg once daily which she thinks has helped.   .  Suggested a LOW FODMAP diet  Which she tried to follow - feels it does help but hard to follow.   Celiac serology negative .She had a chicken salad today and subsequently had a lot of burping.   Current Outpatient Prescriptions  Medication Sig Dispense Refill  . ALPRAZolam (XANAX) 0.5 MG tablet TAKE 1 TABLET BY MOUTH EVERY NIGHT AT BEDTIME AS NEEDED. FOR SLEEP 30 tablet 0  . calcium carbonate (TUMS EX) 750 MG chewable tablet Chew 750 mg by mouth as needed.    . Calcium Carbonate-Vitamin D (CALCIUM-VITAMIN D) 500-200 MG-UNIT per tablet Take 1 tablet by mouth 2 (two) times daily with a meal.     . Fluticasone Propionate (KLS ALLER-FLO NA) Place 2 sprays into the nose daily.    . Lactobacillus (PROBIOTIC ACIDOPHILUS PO) Take by mouth daily.    . magnesium gluconate (MAGONATE) 500 MG tablet Take 250 mg by mouth daily.     . metoprolol succinate (TOPROL XL) 25 MG 24 hr tablet Take 0.5 tablets (12.5 mg total) by mouth daily. 30 tablet 3  . naproxen sodium (ANAPROX) 220 MG tablet Take 220 mg by mouth as needed.    Marland Kitchen omeprazole (PRILOSEC) 40 MG capsule Take 1 capsule (40 mg total) by mouth daily. 90 capsule 3  . psyllium  (TGT PSYLLIUM FIBER) 0.52 g capsule Take 0.52 g by mouth daily.    Marland Kitchen SIMETHICONE-80 PO Take 80 mg by mouth as needed.     No current facility-administered medications for this visit.     Allergies as of 06/12/2017  . (No Known Allergies)    ROS:  General: Negative for anorexia, weight loss, fever, chills, fatigue, weakness. ENT: Negative for hoarseness, difficulty swallowing , nasal congestion. CV: Negative for chest pain, angina, palpitations, dyspnea on exertion, peripheral edema.  Respiratory: Negative for dyspnea at rest, dyspnea on exertion, cough, sputum, wheezing.  GI: See history of present illness. GU:  Negative for dysuria, hematuria, urinary incontinence, urinary frequency, nocturnal urination.  Endo: Negative for unusual weight change.    Physical Examination:   There were no vitals taken for this visit.  General: Well-nourished, well-developed in no acute distress.  Eyes: No icterus. Conjunctivae pink. Mouth: Oropharyngeal mucosa moist and pink , no lesions erythema or exudate. Lungs: Clear to auscultation bilaterally. Non-labored. Heart: Regular rate and rhythm, no murmurs rubs or gallops.  Abdomen: Bowel sounds are normal, nontender, nondistended, no hepatosplenomegaly or masses, no abdominal bruits or hernia , no rebound or guarding.   Extremities: No lower extremity edema. No clubbing or deformities. Neuro: Alert and oriented x 3.  Grossly intact. Skin: Warm and dry, no jaundice.   Psych: Alert and cooperative, normal mood and affect.   Imaging Studies: No results found.  Assessment and Plan:   Susan [name] is a 66 y.o. y/o female e to follow up for GERD, bloating .  Plan  1. Decrease dose of omeprazole to 20 mg a day as she is doing well on 40 mg a day .  2. Bloating better- she would like to consider course of abx at next visit if symptoms persist.    Dr Susan Bellows  MD,MRCP [facility]) Follow up in 3-6 months.

## 2017-06-15 ENCOUNTER — Encounter: Payer: Self-pay | Admitting: Family Medicine

## 2017-06-15 DIAGNOSIS — Z1212 Encounter for screening for malignant neoplasm of rectum: Secondary | ICD-10-CM | POA: Diagnosis not present

## 2017-06-15 DIAGNOSIS — Z1211 Encounter for screening for malignant neoplasm of colon: Secondary | ICD-10-CM | POA: Diagnosis not present

## 2017-06-15 LAB — COLOGUARD

## 2017-10-03 ENCOUNTER — Ambulatory Visit (INDEPENDENT_AMBULATORY_CARE_PROVIDER_SITE_OTHER): Payer: PPO

## 2017-10-03 DIAGNOSIS — Z23 Encounter for immunization: Secondary | ICD-10-CM

## 2017-10-09 ENCOUNTER — Ambulatory Visit: Payer: PPO | Admitting: Gastroenterology

## 2017-10-10 ENCOUNTER — Ambulatory Visit: Payer: PPO | Admitting: Gastroenterology

## 2017-10-24 ENCOUNTER — Encounter: Payer: Self-pay | Admitting: Gastroenterology

## 2017-10-24 ENCOUNTER — Ambulatory Visit (INDEPENDENT_AMBULATORY_CARE_PROVIDER_SITE_OTHER): Payer: PPO | Admitting: Gastroenterology

## 2017-10-24 VITALS — BP 135/80 | HR 51 | Temp 97.5°F | Ht 68.0 in | Wt 157.2 lb

## 2017-10-24 DIAGNOSIS — K219 Gastro-esophageal reflux disease without esophagitis: Secondary | ICD-10-CM | POA: Diagnosis not present

## 2017-10-24 NOTE — Progress Notes (Signed)
Jonathon Bellows MD, MRCP(U.K) [address]  [place]  [place], [place] [address]  Main: [phone]  Fax: [phone]   Primary Care Physician: Lucille Passy, MD  Primary Gastroenterologist:  Dr. Jonathon Bellows   No chief complaint on file.   HPI: Susan [name] is a 66 y.o. female   Susan [name] is a 66 y.o. female .She is here today to follow up for GERD and bloating   Summary of history :  Ongoing for >15 years , stable on 20 mg omeprazole once  A day .   Interval history   06/12/2017-10/24/17  No new complaints , discussed risks of long term PPI use and if doing well plan to change to zantac once at night .   Current Outpatient Prescriptions  Medication Sig Dispense Refill  . ALPRAZolam (XANAX) 0.5 MG tablet TAKE 1 TABLET BY MOUTH EVERY NIGHT AT BEDTIME AS NEEDED. FOR SLEEP 30 tablet 0  . calcium carbonate (TUMS EX) 750 MG chewable tablet Chew 750 mg by mouth as needed.    . Calcium Carbonate-Vitamin D (CALCIUM-VITAMIN D) 500-200 MG-UNIT per tablet Take 1 tablet by mouth 2 (two) times daily with a meal.     . Fluticasone Propionate (KLS ALLER-FLO NA) Place 2 sprays into the nose daily.    . Lactobacillus (PROBIOTIC ACIDOPHILUS PO) Take by mouth daily.    . magnesium gluconate (MAGONATE) 500 MG tablet Take 250 mg by mouth daily.     . metoprolol succinate (TOPROL XL) 25 MG 24 hr tablet Take 0.5 tablets (12.5 mg total) by mouth daily. 30 tablet 3  . naproxen sodium (ANAPROX) 220 MG tablet Take 220 mg by mouth as needed.    Marland Kitchen omeprazole (PRILOSEC) 20 MG capsule Take 1 capsule (20 mg total) by mouth daily. 90 capsule 3  . psyllium (TGT PSYLLIUM FIBER) 0.52 g capsule Take 0.52 g by mouth daily.    Marland Kitchen SIMETHICONE-80 PO Take 80 mg by mouth as needed.     No current facility-administered medications for this visit.     Allergies as of 10/24/2017  . (No Known Allergies)    ROS:  General: Negative for anorexia, weight loss, fever, chills, fatigue, weakness. ENT:  Negative for hoarseness, difficulty swallowing , nasal congestion. CV: Negative for chest pain, angina, palpitations, dyspnea on exertion, peripheral edema.  Respiratory: Negative for dyspnea at rest, dyspnea on exertion, cough, sputum, wheezing.  GI: See history of present illness. GU:  Negative for dysuria, hematuria, urinary incontinence, urinary frequency, nocturnal urination.  Endo: Negative for unusual weight change.    Physical Examination:   There were no vitals taken for this visit.  General: Well-nourished, well-developed in no acute distress.  Eyes: No icterus. Conjunctivae pink. Mouth: Oropharyngeal mucosa moist and pink , no lesions erythema or exudate. Lungs: Clear to auscultation bilaterally. Non-labored. Heart: Regular rate and rhythm, no murmurs rubs or gallops.  Abdomen: Bowel sounds are normal, nontender, nondistended, no hepatosplenomegaly or masses, no abdominal bruits or hernia , no rebound or guarding.   Extremities: No lower extremity edema. No clubbing or deformities. Neuro: Alert and oriented x 3.  Grossly intact. Skin: Warm and dry, no jaundice.   Psych: Alert and cooperative, normal mood and affect.   Imaging Studies: No results found.  Assessment and Plan:   Susan [name] is a 66 y.o. y/o female here to follow up for GERD.  Plan  1. Stop omeprazole 2. Commence on Zantac 150 mg at night .   Dr Bailey Mech  Vicente Males  MD,MRCP [facility]) Follow up PRN

## 2017-10-28 DIAGNOSIS — I340 Nonrheumatic mitral (valve) insufficiency: Secondary | ICD-10-CM | POA: Insufficient documentation

## 2017-10-28 DIAGNOSIS — I471 Supraventricular tachycardia: Secondary | ICD-10-CM | POA: Insufficient documentation

## 2017-10-28 NOTE — Progress Notes (Signed)
Cardiology Office Note  Date:  10/31/2017   ID:  Susan [name], DOB January 10, 1951, MRN [id number]  PCP:  Susan Passy, MD   Chief Complaint  Patient presents with  . other    6 month follow up. Meds reviewed by the pt. verbally. Pt. c/o fluttering in chest at times.     HPI:  Susan [name] is a 66 y.o. female  Mild to moderate MR Nonsmoker Non dibetic Reasonable cholesterol History of nonsustained VT 9 beats seen on event monitor 10 runs of atrial tachycardia/supraventricular tachycardia on event monitor Who presents for follow-up of her arrhythmia  Previous echocardiogram discussed with her detailing mitral valve regurgitation Review of images suggests mild to moderate MR She does not appreciate any symptoms of shortness of breath or fluid retention  She continues to have short runs of tachycardia She reports that strong fast beats disappeared when she started metoprolol On the strong fast beats she had some presyncope symptoms, these have resolved  Otherwise active, no complaints Sometimes with palpitations when she goes to bed at night laying on her left side Unable to tolerate full dose metoprolol succinate 25, takes a half  EKG personally reviewed by myself on todays visit Shows sinus bradycardia rate 52 bpm T wave abnormality 3 and aVF, no change from prior EKGs   Echo 04/04/2017: Left ventricle: The cavity size was normal. Wall thickness wasnormal. estimated ejection fraction was in the range of 55% to 60%.  - Mitral valve: moderateregurgitation. - Tricuspid valve: There was mild regurgitation.  Long-term monitor 04/25/2017: Overall rhythm was sinus.  One run of slightly wide complex tachycardia, 8 beats at average of 179 BPM.   10 episodes of supraventricular tachycardia. Fastest was 16 seconds at 148 BPM. The longest was 42.8 seconds at 138 BPM. One was with a documented symptom. No evidence of significant pauses or AV block, or atrial  fibrillation.  PMH:   has a past medical history of Gastric reflux (90's) and Hiatal hernia (2003).  PSH:    Past Surgical History:  Procedure Laterality Date  . APPENDECTOMY    . BREAST EXCISIONAL BIOPSY Right 2000   benign  . CHOLECYSTECTOMY    . TONSILLECTOMY     age 32  . UPPER GI ENDOSCOPY  2003    Current Outpatient Medications  Medication Sig Dispense Refill  . ALPRAZolam (XANAX) 0.5 MG tablet TAKE 1 TABLET BY MOUTH EVERY NIGHT AT BEDTIME AS NEEDED. FOR SLEEP 30 tablet 0  . calcium carbonate (TUMS EX) 750 MG chewable tablet Chew 750 mg by mouth as needed.    . Calcium Carbonate-Vitamin D (CALCIUM-VITAMIN D) 500-200 MG-UNIT per tablet Take 1 tablet by mouth 2 (two) times daily with a meal.     . Fluticasone Propionate (KLS ALLER-FLO NA) Place 2 sprays into the nose daily.    . Lactobacillus (PROBIOTIC ACIDOPHILUS PO) Take by mouth daily.    . magnesium gluconate (MAGONATE) 500 MG tablet Take 250 mg by mouth daily.     . metoprolol succinate (TOPROL XL) 25 MG 24 hr tablet Take 0.5 tablets (12.5 mg total) by mouth daily. 30 tablet 3  . naproxen sodium (ANAPROX) 220 MG tablet Take 220 mg by mouth as needed.    . psyllium (TGT PSYLLIUM FIBER) 0.52 g capsule Take 0.52 g by mouth daily.    Marland Kitchen SIMETHICONE-80 PO Take 80 mg by mouth as needed.     No current facility-administered medications for this visit.      Allergies:  Patient has no known allergies.   Social History:  The patient  reports that  has never smoked. she has never used smokeless tobacco. She reports that she drinks about 1.2 oz of alcohol per week. She reports that she does not use drugs.   Family History:   family history includes COPD in her mother; Healthy in her brother; Heart failure in her mother; Hyperlipidemia in her mother; Hypertension in her brother, father, mother, and sister; Stroke in her father.    Review of Systems: Review of Systems  Constitutional: Negative.   Respiratory: Negative.    Cardiovascular: Positive for palpitations.  Gastrointestinal: Negative.   Musculoskeletal: Negative.   Neurological: Negative.   Psychiatric/Behavioral: Negative.   All other systems reviewed and are negative.    PHYSICAL EXAM: VS:  BP 130/80 (BP Location: Left Arm, Patient Position: Sitting, Cuff Size: Normal)   Pulse (!) 56   Ht 5\' 8"  (1.727 m)   Wt 156 lb (70.8 kg)   BMI 23.72 kg/m  , BMI Body mass index is 23.72 kg/m. GEN: Well nourished, well developed, in no acute distress  HEENT: normal  Neck: no JVD, carotid bruits, or masses Cardiac: RRR; no murmurs, rubs, or gallops,no edema  Respiratory:  clear to auscultation bilaterally, normal work of breathing GI: soft, nontender, nondistended, + BS MS: no deformity or atrophy  Skin: warm and dry, no rash Neuro:  Strength and sensation are intact Psych: euthymic mood, full affect    Recent Labs: 05/29/2017: ALT 17; BUN 18; Creatinine, Ser 0.91; Hemoglobin 14.8; Platelets 281.0; Potassium 5.0; Sodium 137; TSH 1.96    Lipid Panel Lab Results  Component Value Date   CHOL 193 05/29/2017   HDL 63.40 05/29/2017   LDLCALC 112 (H) 05/29/2017   TRIG 88.0 05/29/2017      Wt Readings from Last 3 Encounters:  10/31/17 156 lb (70.8 kg)  10/24/17 157 lb 3.2 oz (71.3 kg)  06/12/17 155 lb 3.2 oz (70.4 kg)       ASSESSMENT AND PLAN:  Essential hypertension Blood pressure is well controlled on today's visit. No changes made to the medications.  Palpitations - Plan: EKG 12-Lead Has narrow complex tachycardia seen on event monitor, still with symptoms Previous nonsustained VT, does not feel that she is having these anymore on the metoprolol  Atrial tachycardia ([place]) - Plan: EKG 12-Lead .  Episodes of tachycardia, do not last very long Discussed various treatment options with her including taking extra propranolol or diltiazem as needed Also discussed taking antiarrhythmic such as flecainide She prefers no medication changes  at this time but will call us if symptoms get worse  Non-rheumatic mitral regurgitation Echocardiogram images pulled up in the office with her and reviewed, mild to moderate MR No significant murmur on exam  Run of nonsustained VT  previously seen on monitor The seem to have resolved by taking metoprolol   Disposition:   F/U  12 months   Total encounter time more than 25 minutes  Greater than 50% was spent in counseling and coordination of care with the patient    Orders Placed This Encounter  Procedures  . EKG 12-Lead     Signed, Esmond Plants, M.D., Ph.D. 10/31/2017  [place], [place]

## 2017-10-31 ENCOUNTER — Encounter: Payer: Self-pay | Admitting: Cardiovascular Disease

## 2017-10-31 ENCOUNTER — Ambulatory Visit: Payer: PPO | Admitting: Cardiovascular Disease

## 2017-10-31 VITALS — BP 130/80 | HR 56 | Ht 68.0 in | Wt 156.0 lb

## 2017-10-31 DIAGNOSIS — I471 Supraventricular tachycardia: Secondary | ICD-10-CM

## 2017-10-31 DIAGNOSIS — I10 Essential (primary) hypertension: Secondary | ICD-10-CM | POA: Diagnosis not present

## 2017-10-31 DIAGNOSIS — I340 Nonrheumatic mitral (valve) insufficiency: Secondary | ICD-10-CM

## 2017-10-31 DIAGNOSIS — R002 Palpitations: Secondary | ICD-10-CM

## 2017-10-31 NOTE — Patient Instructions (Signed)
Medication Instructions:   No medication changes made  Please call if symptoms get worse  Labwork:  No new labs needed  Testing/Procedures:  No further testing at this time   Follow-Up: It was a pleasure seeing you in the office today. Please call us if you have new issues that need to be addressed before your next appt.  [phone]  Your physician wants you to follow-up in: 12 months.  You will receive a reminder letter in the mail two months in advance. If you don't receive a letter, please call our office to schedule the follow-up appointment.  If you need a refill on your cardiac medications before your next appointment, please call your pharmacy.

## 2017-12-26 DIAGNOSIS — Z923 Personal history of irradiation: Secondary | ICD-10-CM

## 2017-12-26 HISTORY — DX: Personal history of irradiation: Z92.3

## 2018-01-29 ENCOUNTER — Telehealth: Payer: Self-pay | Admitting: Cardiovascular Disease

## 2018-01-29 MED ORDER — METOPROLOL SUCCINATE ER 25 MG PO TB24: 12.5000 mg | ORAL_TABLET | Freq: Every day | ORAL | 3 refills | Status: DC

## 2018-01-29 NOTE — Telephone Encounter (Signed)
°*  STAT* If patient is at the pharmacy, call can be transferred to refill team.   1. Which medications need to be refilled? (please list name of each medication and dose if known) metoprolol succinate 25 MG - 1/2 tablet taken daily   2. Which pharmacy/location (including street and city if local pharmacy) is medication to be sent to? [facility] on [address] in [place]  3. Do they need a 30 day or 90 day supply? 90 day

## 2018-04-04 ENCOUNTER — Other Ambulatory Visit: Payer: Self-pay | Admitting: Family Medicine

## 2018-04-04 DIAGNOSIS — Z1231 Encounter for screening mammogram for malignant neoplasm of breast: Secondary | ICD-10-CM

## 2018-04-10 ENCOUNTER — Other Ambulatory Visit: Payer: Self-pay | Admitting: Family Medicine

## 2018-04-10 MED ORDER — ALPRAZOLAM 0.5 MG PO TABS: 0.5000 mg | ORAL_TABLET | Freq: Every evening | ORAL | 1 refills | Status: DC | PRN

## 2018-04-10 NOTE — Telephone Encounter (Signed)
Copied from [place] [id number]. Topic: Quick Communication - Rx Refill/Question >> Apr 10, 2018 10:42 AM Synthia Innocent wrote: Medication: ALPRAZolam Duanne Moron) 0.5 MG tablet  Has the patient contacted their pharmacy? No. New pharmacy (Agent: If no, request that the patient contact the pharmacy for the refill.) Preferred Pharmacy (with phone number or street name): [facility] Agent: Please be advised that RX refills may take up to 3 business days. We ask that you follow-up with your pharmacy.

## 2018-04-10 NOTE — Telephone Encounter (Signed)
Refill of Xanax  LOV 06/05/17  Dr. Deborra Medina  Note: Pt has appt scheduled 06/06/18 with Dr. Deborra Medina   [facility] - [place], [place] - [place], [place]

## 2018-04-17 ENCOUNTER — Other Ambulatory Visit: Payer: Self-pay | Admitting: Cardiovascular Disease

## 2018-04-17 DIAGNOSIS — R011 Cardiac murmur, unspecified: Secondary | ICD-10-CM

## 2018-04-18 ENCOUNTER — Other Ambulatory Visit: Payer: Self-pay

## 2018-04-18 ENCOUNTER — Ambulatory Visit (INDEPENDENT_AMBULATORY_CARE_PROVIDER_SITE_OTHER): Payer: PPO

## 2018-04-18 DIAGNOSIS — R011 Cardiac murmur, unspecified: Secondary | ICD-10-CM

## 2018-05-16 DIAGNOSIS — H2513 Age-related nuclear cataract, bilateral: Secondary | ICD-10-CM | POA: Diagnosis not present

## 2018-05-24 NOTE — Progress Notes (Signed)
Subjective:   Susan [name] is a 67 y.o. female who presents for Medicare Annual (Subsequent) preventive examination.  Review of Systems: No ROS.  Medicare Wellness Visit. Additional risk factors are reflected in the social history.  Cardiac Risk Factors include: advanced age (>29men, >13 women);hypertension Sleep patterns: Takes Xanax on occasion as needed. Sleeps about 8 hrs.    Home Safety/Smoke Alarms: Feels safe in home. Smoke alarms in place.  Living environment; residence and Firearm Safety: Lives in 2 story home with husband.   Female:   Pap-utd       Mammo-05/25/18       Dexa scan-  Pt would like to do every 3 yrs      CCS- Cologuard 06/15/17-neg    Objective:     Vitals: BP 126/78 (BP Location: Left Arm, Patient Position: Sitting, Cuff Size: Normal)   Pulse 60   Ht 5\' 8"  (1.727 m)   Wt 143 lb 6.4 oz (65 kg)   SpO2 98%   BMI 21.80 kg/m   Body mass index is 21.8 kg/m.  Advanced Directives 05/30/2018 05/29/2017 08/21/2016  Does Patient Have a Medical Advance Directive? Yes Yes Yes  Type of Paramedic of [place];Living will [place];Living will Living will  Copy of [place] in Chart? No - copy requested No - copy requested -    Tobacco Social History   Tobacco Use  Smoking Status Never Smoker  Smokeless Tobacco Never Used     Counseling given: Not Answered   Clinical Intake: Pain : No/denies pain     Past Medical History:  Diagnosis Date  . Gastric reflux 90's  . Hiatal hernia 2003  . Mitral valve regurgitation    Past Surgical History:  Procedure Laterality Date  . APPENDECTOMY    . BREAST EXCISIONAL BIOPSY Right 2000   benign  . CHOLECYSTECTOMY    . TONSILLECTOMY     age 34  . UPPER GI ENDOSCOPY  2003   Family History  Problem Relation Age of Onset  . Hyperlipidemia Mother   . Hypertension Mother   . COPD Mother   . Heart failure Mother   . Stroke Father   . Hypertension  Father   . Hypertension Sister   . Hypertension Brother   . Healthy Brother    Social History   Socioeconomic History  . Marital status: Married    Spouse name: Not on file  . Number of children: Not on file  . Years of education: Not on file  . Highest education level: Not on file  Occupational History  . Not on file  Social Needs  . Financial resource strain: Not on file  . Food insecurity:    Worry: Not on file    Inability: Not on file  . Transportation needs:    Medical: Not on file    Non-medical: Not on file  Tobacco Use  . Smoking status: Never Smoker  . Smokeless tobacco: Never Used  Substance and Sexual Activity  . Alcohol use: Yes    Alcohol/week: 1.2 oz    Types: 2 Glasses of wine per week    Comment: daily  . Drug use: No  . Sexual activity: Not Currently  Lifestyle  . Physical activity:    Days per week: Not on file    Minutes per session: Not on file  . Stress: Not on file  Relationships  . Social connections:    Talks on phone: Not  on file    Gets together: Not on file    Attends religious service: Not on file    Active member of club or organization: Not on file    Attends meetings of clubs or organizations: Not on file    Relationship status: Not on file  Other Topics Concern  . Not on file  Social History Narrative   She does have a living will.   Desires CPR    Outpatient Encounter Medications as of 05/30/2018  Medication Sig  . ALPRAZolam (XANAX) 0.5 MG tablet Take 1 tablet (0.5 mg total) by mouth at bedtime as needed. for sleep  . calcium carbonate (TUMS EX) 750 MG chewable tablet Chew 750 mg by mouth as needed.  . Calcium Carbonate-Vitamin D (CALCIUM-VITAMIN D) 500-200 MG-UNIT per tablet Take 1 tablet by mouth 2 (two) times daily with a meal.   . Fluticasone Propionate (KLS ALLER-FLO NA) Place 2 sprays into the nose daily.  . metoprolol succinate (TOPROL XL) 25 MG 24 hr tablet Take 0.5 tablets (12.5 mg total) by mouth daily.  . naproxen  sodium (ANAPROX) 220 MG tablet Take 220 mg by mouth as needed.  . psyllium (TGT PSYLLIUM FIBER) 0.52 g capsule Take 0.52 g by mouth daily.  Marland Kitchen SIMETHICONE-80 PO Take 80 mg by mouth as needed.  . Lactobacillus (PROBIOTIC ACIDOPHILUS PO) Take by mouth daily.  . magnesium gluconate (MAGONATE) 500 MG tablet Take 250 mg by mouth daily.    No facility-administered encounter medications on file as of 05/30/2018.     Activities of Daily Living In your present state of health, do you have any difficulty performing the following activities: 05/30/2018  Hearing? N  Vision? N  Comment wears glasses. Dr.Ballenger yearly  Difficulty concentrating or making decisions? N  Walking or climbing stairs? N  Dressing or bathing? N  Doing errands, shopping? N  Preparing Food and eating ? N  Using the Toilet? N  In the past six months, have you accidently leaked urine? N  Do you have problems with loss of bowel control? N  Managing your Medications? N  Managing your Finances? N  Housekeeping or managing your Housekeeping? N  Some recent data might be hidden    Patient Care Team: Lucille Passy, MD as PCP - General Lacinda Axon Tedra Coupe, MD as Referring Physician (Dermatology)    Assessment:   This is a routine wellness examination for Lachandra. Physical assessment deferred to PCP.  Exercise Activities and Dietary recommendations Current Exercise Habits: Home exercise routine, Type of exercise: walking(at least 10000 steps per day), Frequency (Times/Week): 7, Intensity: Mild Diet (meal preparation, eat out, water intake, caffeinated beverages, dairy products, fruits and vegetables): in general, a "healthy" diet  , well balanced   Goals    . Maintain current healthy active lifestyle.       Fall Risk Fall Risk  05/30/2018 05/29/2017 01/25/2016  Falls in the past year? No No No    Depression Screen PHQ 2/9 Scores 05/30/2018 05/29/2017 01/25/2016  PHQ - 2 Score 0 0 0     Cognitive Function Ad8 score  reviewed for issues:  Issues making decisions:no  Less interest in hobbies / activities:no  Repeats questions, stories (family complaining):no  Trouble using ordinary gadgets (microwave, computer, phone):no  Forgets the month or year: no  Mismanaging finances: no  Remembering appts:no  Daily problems with thinking and/or memory:no Ad8 score is=0   MMSE - Mini Mental State Exam 05/29/2017  Orientation to time 5  Orientation to Place 5  Registration 3  Attention/ Calculation 0  Recall 3  Language- name 2 objects 0  Language- repeat 1  Language- follow 3 step command 3  Language- read & follow direction 0  Write a sentence 0  Copy design 0  Total score 20        Immunization History  Administered Date(s) Administered  . Influenza,inj,Quad PF,6+ Mos 10/02/2013, 09/23/2015, 09/16/2016, 10/03/2017  . Pneumococcal Conjugate-13 01/25/2016  . Pneumococcal Polysaccharide-23 05/29/2017  . Td 12/03/2008  . Zoster 09/29/2015    Screening Tests Health Maintenance  Topic Date Due  . Hepatitis C Screening  01/24/2019 (Originally [birth year])  . INFLUENZA VACCINE  07/26/2018  . TETANUS/TDAP  12/03/2018  . MAMMOGRAM  05/25/2020  . COLONOSCOPY  06/16/2027  . DEXA SCAN  Completed  . PNA vac Low Risk Adult  Completed  . COLON CANCER SCREENING ANNUAL FOBT  Discontinued      Plan:   Follow up with Dr.Aron 06/06/18 as scheduled  Please schedule your next medicare wellness visit with me in 1 yr.  Continue to eat heart healthy diet (full of fruits, vegetables, whole grains, lean protein, water--limit salt, fat, and sugar intake) and increase physical activity as tolerated.  Continue doing brain stimulating activities (puzzles, reading, adult coloring books, staying active) to keep memory sharp.   Bring a copy of your living will and/or healthcare power of attorney to your next office visit.  You may try a teaspoon of mustard for vinegar for nightly leg cramps.   Begin using  small weights as discussed.  I have personally reviewed and noted the following in the patient's chart:   . Medical and social history . Use of alcohol, tobacco or illicit drugs  . Current medications and supplements . Functional ability and status . Nutritional status . Physical activity . Advanced directives . List of other physicians . Hospitalizations, surgeries, and ER visits in previous 12 months . Vitals . Screenings to include cognitive, depression, and falls . Referrals and appointments  In addition, I have reviewed and discussed with patient certain preventive protocols, quality metrics, and best practice recommendations. A written personalized care plan for preventive services as well as general preventive health recommendations were provided to patient.     Shela Nevin, [place]  05/30/2018

## 2018-05-25 ENCOUNTER — Ambulatory Visit
Admission: RE | Admit: 2018-05-25 | Discharge: 2018-05-25 | Disposition: A | Discharge status: Home / Self Care | Payer: PPO | Source: Ambulatory Visit | Attending: Family Medicine | Admitting: Family Medicine

## 2018-05-25 DIAGNOSIS — Z1231 Encounter for screening mammogram for malignant neoplasm of breast: Secondary | ICD-10-CM | POA: Insufficient documentation

## 2018-05-29 ENCOUNTER — Other Ambulatory Visit: Payer: Self-pay | Admitting: Family Medicine

## 2018-05-29 DIAGNOSIS — R928 Other abnormal and inconclusive findings on diagnostic imaging of breast: Secondary | ICD-10-CM

## 2018-05-29 DIAGNOSIS — R921 Mammographic calcification found on diagnostic imaging of breast: Secondary | ICD-10-CM

## 2018-05-30 ENCOUNTER — Encounter: Payer: Self-pay | Admitting: Behavioral Health

## 2018-05-30 ENCOUNTER — Other Ambulatory Visit (INDEPENDENT_AMBULATORY_CARE_PROVIDER_SITE_OTHER): Payer: PPO

## 2018-05-30 ENCOUNTER — Ambulatory Visit: Payer: PPO

## 2018-05-30 ENCOUNTER — Other Ambulatory Visit: Payer: Self-pay

## 2018-05-30 ENCOUNTER — Ambulatory Visit (INDEPENDENT_AMBULATORY_CARE_PROVIDER_SITE_OTHER): Payer: PPO | Admitting: Behavioral Health

## 2018-05-30 VITALS — BP 126/78 | HR 60 | Ht 68.0 in | Wt 143.4 lb

## 2018-05-30 DIAGNOSIS — K219 Gastro-esophageal reflux disease without esophagitis: Secondary | ICD-10-CM

## 2018-05-30 DIAGNOSIS — I10 Essential (primary) hypertension: Secondary | ICD-10-CM

## 2018-05-30 DIAGNOSIS — E7800 Pure hypercholesterolemia, unspecified: Secondary | ICD-10-CM

## 2018-05-30 DIAGNOSIS — Z0000 Encounter for general adult medical examination without abnormal findings: Secondary | ICD-10-CM | POA: Diagnosis not present

## 2018-05-30 LAB — CBC WITH DIFFERENTIAL/PLATELET
Basophils Absolute: 0.1 10*3/uL (ref 0.0–0.1)
Basophils Relative: 1.4 % (ref 0.0–3.0)
EOS PCT: 1.8 % (ref 0.0–5.0)
Eosinophils Absolute: 0.1 10*3/uL (ref 0.0–0.7)
HCT: 45.6 % (ref 36.0–46.0)
Hemoglobin: 15.3 g/dL — ABNORMAL HIGH (ref 12.0–15.0)
LYMPHS ABS: 1.1 10*3/uL (ref 0.7–4.0)
Lymphocytes Relative: 23.3 % (ref 12.0–46.0)
MCHC: 33.6 g/dL (ref 30.0–36.0)
MCV: 89.3 fl (ref 78.0–100.0)
MONO ABS: 0.5 10*3/uL (ref 0.1–1.0)
MONOS PCT: 10.2 % (ref 3.0–12.0)
NEUTROS ABS: 3 10*3/uL (ref 1.4–7.7)
NEUTROS PCT: 63.3 % (ref 43.0–77.0)
PLATELETS: 273 10*3/uL (ref 150.0–400.0)
RBC: 5.1 Mil/uL (ref 3.87–5.11)
RDW: 13.8 % (ref 11.5–15.5)
WBC: 4.7 10*3/uL (ref 4.0–10.5)

## 2018-05-30 LAB — LIPID PANEL
CHOLESTEROL: 202 mg/dL — AB (ref 0–200)
HDL: 66 mg/dL (ref >39.00)
LDL CALC: 122 mg/dL — AB (ref 0–99)
NONHDL: 136.06
Total CHOL/HDL Ratio: 3
Triglycerides: 72 mg/dL (ref 0.0–149.0)
VLDL: 14.4 mg/dL (ref 0.0–40.0)

## 2018-05-30 LAB — COMPREHENSIVE METABOLIC PANEL
ALT: 15 U/L (ref 0–35)
AST: 18 U/L (ref 0–37)
Albumin: 4.3 g/dL (ref 3.5–5.2)
Alkaline Phosphatase: 63 U/L (ref 39–117)
BUN: 17 mg/dL (ref 6–23)
CHLORIDE: 101 meq/L (ref 96–112)
CO2: 29 meq/L (ref 19–32)
Calcium: 9.7 mg/dL (ref 8.4–10.5)
Creatinine, Ser: 0.71 mg/dL (ref 0.40–1.20)
GFR: 87.05 mL/min (ref >60.00)
GLUCOSE: 100 mg/dL — AB (ref 70–99)
POTASSIUM: 5.3 meq/L — AB (ref 3.5–5.1)
Sodium: 136 mEq/L (ref 135–145)
Total Bilirubin: 1 mg/dL (ref 0.2–1.2)
Total Protein: 6.7 g/dL (ref 6.0–8.3)

## 2018-05-30 LAB — TSH: TSH: 1.89 u[IU]/mL (ref 0.35–4.50)

## 2018-05-30 NOTE — Patient Instructions (Signed)
Follow up with Dr.Aron 06/06/18 as scheduled  Please schedule your next medicare wellness visit with me in 1 yr.  Continue to eat heart healthy diet (full of fruits, vegetables, whole grains, lean protein, water--limit salt, fat, and sugar intake) and increase physical activity as tolerated.  Continue doing brain stimulating activities (puzzles, reading, adult coloring books, staying active) to keep memory sharp.   Bring a copy of your living will and/or healthcare power of attorney to your next office visit.  You may try a teaspoon of mustard for vinegar for nightly leg cramps.   Begin using small weights as discussed.   Susan [name] , Thank you for taking time to come for your Medicare Wellness Visit. I appreciate your ongoing commitment to your health goals. Please review the following plan we discussed and let me know if I can assist you in the future.   These are the goals we discussed: Goals    . Maintain current healthy active lifestyle.       This is a list of the screening recommended for you and due dates:  Health Maintenance  Topic Date Due  .  Hepatitis C: One time screening is recommended by Center for Disease Control  (CDC) for  adults born from [birth year] through 1966.   01/24/2019*  . Flu Shot  07/26/2018  . Tetanus Vaccine  12/03/2018  . Mammogram  05/25/2020  . Colon Cancer Screening  06/16/2027  . DEXA scan (bone density measurement)  Completed  . Pneumonia vaccines  Completed  . Stool Blood Test  Discontinued  *Topic was postponed. The date shown is not the original due date.      Preventive Care 48 Years and Older, Female Preventive care refers to lifestyle choices and visits with your health care provider that can promote health and wellness. What does preventive care include?  A yearly physical exam. This is also called an annual well check.  Dental exams once or twice a year.  Routine eye exams. Ask your health care provider how often you should have  your eyes checked.  Personal lifestyle choices, including: ? Daily care of your teeth and gums. ? Regular physical activity. ? Eating a healthy diet. ? Avoiding tobacco and drug use. ? Limiting alcohol use. ? Practicing safe sex. ? Taking low-dose aspirin every day. ? Taking vitamin and mineral supplements as recommended by your health care provider. What happens during an annual well check? The services and screenings done by your health care provider during your annual well check will depend on your age, overall health, lifestyle risk factors, and family history of disease. Counseling Your health care provider may ask you questions about your:  Alcohol use.  Tobacco use.  Drug use.  Emotional well-being.  Home and relationship well-being.  Sexual activity.  Eating habits.  History of falls.  Memory and ability to understand (cognition).  Work and work Statistician.  Reproductive health.  Screening You may have the following tests or measurements:  Height, weight, and BMI.  Blood pressure.  Lipid and cholesterol levels. These may be checked every 5 years, or more frequently if you are over 52 years old.  Skin check.  Lung cancer screening. You may have this screening every year starting at age 66 if you have a 30-pack-year history of smoking and currently smoke or have quit within the past 15 years.  Fecal occult blood test (FOBT) of the stool. You may have this test every year starting at age 36.  Flexible  sigmoidoscopy or colonoscopy. You may have a sigmoidoscopy every 5 years or a colonoscopy every 10 years starting at age 87.  Hepatitis C blood test.  Hepatitis B blood test.  Sexually transmitted disease (STD) testing.  Diabetes screening. This is done by checking your blood sugar (glucose) after you have not eaten for a while (fasting). You may have this done every 1-3 years.  Bone density scan. This is done to screen for osteoporosis. You may have  this done starting at age 52.  Mammogram. This may be done every 1-2 years. Talk to your health care provider about how often you should have regular mammograms.  Talk with your health care provider about your test results, treatment options, and if necessary, the need for more tests. Vaccines Your health care provider may recommend certain vaccines, such as:  Influenza vaccine. This is recommended every year.  Tetanus, diphtheria, and acellular pertussis (Tdap, Td) vaccine. You may need a Td booster every 10 years.  Varicella vaccine. You may need this if you have not been vaccinated.  Zoster vaccine. You may need this after age 78.  Measles, mumps, and rubella (MMR) vaccine. You may need at least one dose of MMR if you were born in 1957 or later. You may also need a second dose.  Pneumococcal 13-valent conjugate (PCV13) vaccine. One dose is recommended after age 62.  Pneumococcal polysaccharide (PPSV23) vaccine. One dose is recommended after age 54.  Meningococcal vaccine. You may need this if you have certain conditions.  Hepatitis A vaccine. You may need this if you have certain conditions or if you travel or work in places where you may be exposed to hepatitis A.  Hepatitis B vaccine. You may need this if you have certain conditions or if you travel or work in places where you may be exposed to hepatitis B.  Haemophilus influenzae type b (Hib) vaccine. You may need this if you have certain conditions.  Talk to your health care provider about which screenings and vaccines you need and how often you need them. This information is not intended to replace advice given to you by your health care provider. Make sure you discuss any questions you have with your health care provider. Document Released: 01/08/2016 Document Revised: 08/31/2016 Document Reviewed: 10/13/2015 Elsevier Interactive Patient Education  [employer].

## 2018-05-30 NOTE — Progress Notes (Signed)
I reviewed health advisor's note, was available for consultation, and agree with documentation and plan.  

## 2018-06-06 ENCOUNTER — Ambulatory Visit (INDEPENDENT_AMBULATORY_CARE_PROVIDER_SITE_OTHER): Payer: PPO | Admitting: Family Medicine

## 2018-06-06 ENCOUNTER — Other Ambulatory Visit: Payer: Self-pay | Admitting: Family Medicine

## 2018-06-06 ENCOUNTER — Ambulatory Visit
Admission: RE | Admit: 2018-06-06 | Discharge: 2018-06-06 | Disposition: A | Discharge status: Home / Self Care | Payer: PPO | Source: Ambulatory Visit | Attending: Family Medicine | Admitting: Family Medicine

## 2018-06-06 ENCOUNTER — Encounter: Payer: Self-pay | Admitting: Family Medicine

## 2018-06-06 VITALS — BP 120/82 | HR 49 | Temp 97.8°F | Ht 68.0 in | Wt 142.8 lb

## 2018-06-06 DIAGNOSIS — Z01419 Encounter for gynecological examination (general) (routine) without abnormal findings: Secondary | ICD-10-CM

## 2018-06-06 DIAGNOSIS — I10 Essential (primary) hypertension: Secondary | ICD-10-CM | POA: Diagnosis not present

## 2018-06-06 DIAGNOSIS — Z0000 Encounter for general adult medical examination without abnormal findings: Secondary | ICD-10-CM

## 2018-06-06 DIAGNOSIS — R928 Other abnormal and inconclusive findings on diagnostic imaging of breast: Secondary | ICD-10-CM

## 2018-06-06 DIAGNOSIS — R921 Mammographic calcification found on diagnostic imaging of breast: Secondary | ICD-10-CM | POA: Diagnosis not present

## 2018-06-06 DIAGNOSIS — K219 Gastro-esophageal reflux disease without esophagitis: Secondary | ICD-10-CM | POA: Diagnosis not present

## 2018-06-06 DIAGNOSIS — R920 Mammographic microcalcification found on diagnostic imaging of breast: Secondary | ICD-10-CM | POA: Diagnosis not present

## 2018-06-06 NOTE — Assessment & Plan Note (Signed)
Reviewed preventive care protocols, scheduled due services, and updated immunizations Discussed nutrition, exercise, diet, and healthy lifestyle.  

## 2018-06-06 NOTE — Assessment & Plan Note (Signed)
Well controlled.  No changes made. 

## 2018-06-06 NOTE — Progress Notes (Signed)
Subjective:   Patient ID: Susan [name], female    DOB: 07/25/1951, 67 y.o.   MRN: [id number]  Susan [name] is a pleasant 67 y.o. year old female who presents to clinic today with Annual Exam (CPE--copy of lab in room. )  on 06/06/2018  HPI:  Bradly Bienenstock RN on 05/30/18 for annual medicare visit. Note reviewed.   Health Maintenance  Topic Date Due  . Hepatitis C Screening  01/24/2019 (Originally 1951-05-20)  . INFLUENZA VACCINE  07/26/2018  . TETANUS/TDAP  12/03/2018  . MAMMOGRAM  05/25/2020  . COLONOSCOPY  06/16/2027  . DEXA SCAN  Completed  . PNA vac Low Risk Adult  Completed  . COLON CANCER SCREENING ANNUAL FOBT  Discontinued    Takes rare xanax for insomnia.  HTN- has been well controlled Toprol XL (also helps with SVT).  Lab Results  Component Value Date   CHOL 202 (H) 05/30/2018   HDL 66.00 05/30/2018   LDLCALC 122 (H) 05/30/2018   TRIG 72.0 05/30/2018   CHOLHDL 3 05/30/2018   Lab Results  Component Value Date   CREATININE 0.71 05/30/2018   Lab Results  Component Value Date   TSH 1.89 05/30/2018   Lab Results  Component Value Date   WBC 4.7 05/30/2018   HGB 15.3 (H) 05/30/2018   HCT 45.6 05/30/2018   MCV 89.3 05/30/2018   PLT 273.0 05/30/2018   Current Outpatient Medications on File Prior to Visit  Medication Sig Dispense Refill  . ALPRAZolam (XANAX) 0.5 MG tablet Take 1 tablet (0.5 mg total) by mouth at bedtime as needed. for sleep 30 tablet 1  . calcium carbonate (TUMS EX) 750 MG chewable tablet Chew 750 mg by mouth as needed.    . Calcium Carbonate-Vitamin D (CALCIUM-VITAMIN D) 500-200 MG-UNIT per tablet Take 1 tablet by mouth 2 (two) times daily with a meal.     . Fluticasone Propionate (KLS ALLER-FLO NA) Place 2 sprays into the nose daily.    . magnesium gluconate (MAGONATE) 500 MG tablet Take 250 mg by mouth daily.     . metoprolol succinate (TOPROL XL) 25 MG 24 hr tablet Take 0.5 tablets (12.5 mg total) by mouth daily. 30 tablet 3  . naproxen  sodium (ANAPROX) 220 MG tablet Take 220 mg by mouth as needed.    . psyllium (TGT PSYLLIUM FIBER) 0.52 g capsule Take 0.52 g by mouth daily.    Marland Kitchen SIMETHICONE-80 PO Take 80 mg by mouth as needed.    . Lactobacillus (PROBIOTIC ACIDOPHILUS PO) Take by mouth daily.     No current facility-administered medications on file prior to visit.     No Known Allergies  Past Medical History:  Diagnosis Date  . Gastric reflux 90's  . Hiatal hernia 2003  . Mitral valve regurgitation     Past Surgical History:  Procedure Laterality Date  . APPENDECTOMY    . BREAST EXCISIONAL BIOPSY Right 2000   benign  . CHOLECYSTECTOMY    . TONSILLECTOMY     age 50  . UPPER GI ENDOSCOPY  2003    Family History  Problem Relation Age of Onset  . Hyperlipidemia Mother   . Hypertension Mother   . COPD Mother   . Heart failure Mother   . Stroke Father   . Hypertension Father   . Hypertension Sister   . Hypertension Brother   . Healthy Brother     Social History   Socioeconomic History  . Marital status: Married    Spouse  name: Not on file  . Number of children: Not on file  . Years of education: Not on file  . Highest education level: Not on file  Occupational History  . Not on file  Social Needs  . Financial resource strain: Not on file  . Food insecurity:    Worry: Not on file    Inability: Not on file  . Transportation needs:    Medical: Not on file    Non-medical: Not on file  Tobacco Use  . Smoking status: Never Smoker  . Smokeless tobacco: Never Used  Substance and Sexual Activity  . Alcohol use: Yes    Alcohol/week: 1.2 oz    Types: 2 Glasses of wine per week    Comment: daily  . Drug use: No  . Sexual activity: Not Currently  Lifestyle  . Physical activity:    Days per week: Not on file    Minutes per session: Not on file  . Stress: Not on file  Relationships  . Social connections:    Talks on phone: Not on file    Gets together: Not on file    Attends religious  service: Not on file    Active member of club or organization: Not on file    Attends meetings of clubs or organizations: Not on file    Relationship status: Not on file  . Intimate partner violence:    Fear of current or ex partner: Not on file    Emotionally abused: Not on file    Physically abused: Not on file    Forced sexual activity: Not on file  Other Topics Concern  . Not on file  Social History Narrative   She does have a living will.   Desires CPR   The PMH, PSH, Social History, Family History, Medications, and allergies have been reviewed in [facility], and have been updated if relevant.  Review of Systems  Constitutional: Negative.   HENT: Negative.   Eyes: Negative.   Respiratory: Negative.   Cardiovascular: Negative.   Gastrointestinal: Negative.   Endocrine: Negative.   Genitourinary: Negative.   Musculoskeletal: Negative.   Skin: Negative.   Allergic/Immunologic: Negative.   Neurological: Negative.   Hematological: Negative.   Psychiatric/Behavioral: Negative.   All other systems reviewed and are negative.      Objective:    BP 120/82   Pulse (!) 49   Temp 97.8 F (36.6 C) (Oral)   Ht 5\' 8"  (1.727 m)   Wt 142 lb 12.8 oz (64.8 kg)   SpO2 96%   BMI 21.71 kg/m    Physical Exam  Constitutional: She is oriented to person, place, and time. She appears well-developed and well-nourished. No distress.  HENT:  Head: Normocephalic and atraumatic.  Eyes: Conjunctivae are normal.  Neck: Normal range of motion.  Cardiovascular: Normal rate and regular rhythm.  Pulmonary/Chest: Effort normal and breath sounds normal. Right breast exhibits no inverted nipple, no mass, no nipple discharge, no skin change and no tenderness. Left breast exhibits no inverted nipple, no mass, no nipple discharge, no skin change and no tenderness. Breasts are symmetrical.  Musculoskeletal: Normal range of motion. She exhibits no edema.  Neurological: She is alert and oriented to person,  place, and time. No cranial nerve deficit. Coordination normal.  Skin: Skin is warm and dry. She is not diaphoretic.  Psychiatric: She has a normal mood and affect. Her behavior is normal. Judgment and thought content normal.  Nursing note and vitals reviewed.  Assessment & Plan:   Well woman exam  Essential hypertension  Gastroesophageal reflux disease, esophagitis presence not specified No follow-ups on file.

## 2018-06-06 NOTE — Patient Instructions (Signed)
Great to see you. Have a wonderful summer and birthday!

## 2018-06-07 DIAGNOSIS — L821 Other seborrheic keratosis: Secondary | ICD-10-CM | POA: Diagnosis not present

## 2018-06-07 DIAGNOSIS — D1800 Hemangioma unspecified site: Secondary | ICD-10-CM | POA: Diagnosis not present

## 2018-06-07 DIAGNOSIS — L578 Other skin changes due to chronic exposure to nonionizing radiation: Secondary | ICD-10-CM | POA: Diagnosis not present

## 2018-06-07 DIAGNOSIS — L820 Inflamed seborrheic keratosis: Secondary | ICD-10-CM | POA: Diagnosis not present

## 2018-06-07 DIAGNOSIS — Z85828 Personal history of other malignant neoplasm of skin: Secondary | ICD-10-CM | POA: Diagnosis not present

## 2018-06-07 DIAGNOSIS — Z1283 Encounter for screening for malignant neoplasm of skin: Secondary | ICD-10-CM | POA: Diagnosis not present

## 2018-06-07 DIAGNOSIS — D692 Other nonthrombocytopenic purpura: Secondary | ICD-10-CM | POA: Diagnosis not present

## 2018-06-13 ENCOUNTER — Ambulatory Visit
Admission: RE | Admit: 2018-06-13 | Discharge: 2018-06-13 | Disposition: A | Discharge status: Home / Self Care | Payer: PPO | Source: Ambulatory Visit | Attending: Family Medicine | Admitting: Family Medicine

## 2018-06-13 DIAGNOSIS — R928 Other abnormal and inconclusive findings on diagnostic imaging of breast: Secondary | ICD-10-CM

## 2018-06-13 DIAGNOSIS — R921 Mammographic calcification found on diagnostic imaging of breast: Secondary | ICD-10-CM

## 2018-06-13 DIAGNOSIS — D0512 Intraductal carcinoma in situ of left breast: Secondary | ICD-10-CM | POA: Diagnosis not present

## 2018-06-13 HISTORY — PX: BREAST BIOPSY: SHX20

## 2018-06-14 ENCOUNTER — Other Ambulatory Visit: Payer: Self-pay | Admitting: Pathology

## 2018-06-18 ENCOUNTER — Other Ambulatory Visit: Payer: Self-pay | Admitting: Family Medicine

## 2018-06-18 DIAGNOSIS — D0510 Intraductal carcinoma in situ of unspecified breast: Secondary | ICD-10-CM

## 2018-06-18 LAB — SURGICAL PATHOLOGY

## 2018-06-19 ENCOUNTER — Encounter: Payer: Self-pay | Admitting: *Deleted

## 2018-06-19 NOTE — Progress Notes (Signed)
  Oncology Nurse Navigator Documentation  Navigator Location: [facility] (06/19/18 0900) Referral date to RadOnc/MedOnc: 06/25/18 (06/19/18 0900) )Navigator Encounter Type: Introductory phone call (06/19/18 0900)   Abnormal Finding Date: 06/06/18 (06/19/18 0900) Confirmed Diagnosis Date: 06/14/18 (06/19/18 0900)                   Barriers/Navigation Needs: Education;Coordination of Care (06/19/18 0900) Education: Newly Diagnosed Cancer Education (06/19/18 0900) Interventions: Coordination of Care;Education (06/19/18 0900)   Coordination of Care: Appts (06/19/18 0900) Education Method: Verbal;Written (06/19/18 0900)                Time Spent with Patient: 45 (06/19/18 0900)   Spoke to patient today to establish navigation services.  She is newly diagnosed with DCIS of the left breast.  She was initially scheduled to see Dr. Clerance Lav on 07/02/18.  States she is not on vacation and would like to be seen sooner.  I have rescheduled her to see Dr. Clerance Lav tomorrow at 10:00.  Appointment scheduled to see Dr. Mike Gip on 06/25/18 @ 1:30.  Patient is to pick up her educational literature tomorrow at Dr. Landry Dyke office.  She is to call if she has any questions or needs.

## 2018-06-20 ENCOUNTER — Encounter: Payer: Self-pay | Admitting: Surgery

## 2018-06-20 ENCOUNTER — Ambulatory Visit (INDEPENDENT_AMBULATORY_CARE_PROVIDER_SITE_OTHER): Payer: PPO | Admitting: Surgery

## 2018-06-20 VITALS — BP 156/75 | HR 47 | Temp 97.3°F | Ht 68.0 in | Wt 144.2 lb

## 2018-06-20 DIAGNOSIS — D0512 Intraductal carcinoma in situ of left breast: Secondary | ICD-10-CM

## 2018-06-20 NOTE — Patient Instructions (Signed)
We have spoken today about removing a lump in your breast. This will be done on 06/26/18 by Dr. Dahlia Byes at [facility].  You will most likely be able to leave the hospital several hours after your surgery. Rarely, a patient needs to stay over night but this is a possibility.  Plan to tenatively be off work for 1-2 weeks following the surgery and may return with approximately 4 more weeks of a lifting restriction, no greater than 15 lbs.    Lumpectomy A lumpectomy is a form of "breast conserving" or "breast preservation" surgery. It may also be referred to as a partial mastectomy. During a lumpectomy, the portion of the breast that contains the cancerous tumor or breast mass (the lump) is removed. Some normal tissue around the lump may also be removed to make sure all of the tumor has been removed.  LET [facility] CARE PROVIDER KNOW ABOUT:  Any allergies you have.  All medicines you are taking, including vitamins, herbs, eye drops, creams, and over-the-counter medicines.  Previous problems you or members of your family have had with the use of anesthetics.  Any blood disorders you have.  Previous surgeries you have had.  Medical conditions you have. RISKS AND COMPLICATIONS Generally, this is a safe procedure. However, problems can occur and include:  Bleeding.  Infection.  Pain.  Temporary swelling.  Change in the shape of the breast, particularly if a large portion is removed. BEFORE THE PROCEDURE  Ask your health care provider about changing or stopping your regular medicines. This is especially important if you are taking diabetes medicines or blood thinners.  Do not eat or drink anything after midnight on the night before the procedure or as directed by your health care provider. Ask your health care provider if you can take a sip of water with any approved medicines.  On the day of surgery, your health care provider will use a mammogram or ultrasound to locate and mark the tumor in  your breast. These markings on your breast will show where the cut (incision) will be made. PROCEDURE   An IV tube will be put into one of your veins.  You may be given medicine to help you relax before the surgery (sedative). You will be given one of the following:  A medicine that numbs the area (local anesthetic).  A medicine that makes you fall asleep (general anesthetic).  Your health care provider will use a kind of electric scalpel that uses heat to minimize bleeding (electrocautery knife).  A curved incision (like a smile or frown) that follows the natural curve of your breast is made, to allow for minimal scarring and better healing.  The tumor will be removed with some of the surrounding tissue. This will be sent to the lab for analysis. Your health care provider may also remove your lymph nodes at this time if needed.  Sometimes, but not always, a rubber tube called a drain will be surgically inserted into your breast area or armpit to collect excess fluid that may accumulate in the space where the tumor was. This drain is connected to a plastic bulb on the outside of your body. This drain creates suction to help remove the fluid.  The incisions will be closed with stitches (sutures).  A bandage may be placed over the incisions. AFTER THE PROCEDURE  You will be taken to the recovery area.  You will be given medicine for pain.  A small rubber drain may be placed in the  breast for 2-3 days to prevent a collection of blood (hematoma) from developing in the breast. You will be given instructions on caring for the drain before you go home.  A pressure bandage (dressing) will be applied for 1-2 days to prevent bleeding. Ask your health care provider how to care for your bandage at home.   This information is not intended to replace advice given to you by your health care provider. Make sure you discuss any questions you have with your health care provider.   Document Released:  01/23/2007 Document Revised: 01/02/2015 Document Reviewed: 05/17/2013 Elsevier Interactive Patient Education [employer].

## 2018-06-21 ENCOUNTER — Other Ambulatory Visit: Payer: Self-pay | Admitting: Surgery

## 2018-06-21 ENCOUNTER — Telehealth: Payer: Self-pay | Admitting: Surgery

## 2018-06-21 ENCOUNTER — Encounter: Payer: Self-pay | Admitting: Surgery

## 2018-06-21 DIAGNOSIS — D0512 Intraductal carcinoma in situ of left breast: Secondary | ICD-10-CM

## 2018-06-21 NOTE — H&P (View-Only) (Signed)
Patient ID: Susan [name], female   DOB: [birth year], 67 y.o.   MRN: [id number]  HPI Susan [name] is a 67 y.o. female in consultation at the request of Dr.Wei-Chen Nicoletta Dress.  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  She underwent stereotactic core needle biopsy no suspicious calcifications.  Please note that I have personally reviewed the ultrasound as well as the mammograms.  Patient area was around 2 x 2.4 cm. A marker was placed 6 cm superior to the biopsy site. The patient denied any previous symptoms.  No breast pain no breast masses no breast discharge or changes with the skin or nipple. Does have a history of precautions with breast cancer and one of them w BRCA positive. No First degree relatives w breast CA. She is menopausal. She did have a right breast biopsy for benign disease several years ago. Denies any hormonal therapy.  Does have a history of mitral valve regurg.  She is on a beta-blocker but no evidence of A. Fib. She Is able to perform more than 6 METS of activity without any shortness of breath or chest pain. Recent CBC and CMP are completely normal except her k (high). Pathology:ATYPICAL SCLEROSING PAPILLARY LESION WITH CALCIFICATIONS.  - AREAS OF DUCTAL CARCINOMA IN SITU, INTERMEDIATE NUCLEAR GRADE ER + PR (-)      HPI  Past Medical History:  Diagnosis Date  . Gastric reflux 90's  . Hiatal hernia 2003  . Mitral valve regurgitation     Past Surgical History:  Procedure Laterality Date  . APPENDECTOMY    . BREAST BIOPSY Left 06/13/2018   Affirm Bx- coil clip-path pending  . BREAST EXCISIONAL BIOPSY Right 2000   benign  . CHOLECYSTECTOMY    . TONSILLECTOMY     age 39  . UPPER GI ENDOSCOPY  2003    Family History  Problem Relation Age of Onset  . Hyperlipidemia Mother   . Hypertension Mother   . COPD Mother   . Heart failure Mother   . Stroke Father   . Hypertension Father   .  Hypertension Sister   . Hypertension Brother   . Healthy Brother     Social History Social History   Tobacco Use  . Smoking status: Never Smoker  . Smokeless tobacco: Never Used  Substance Use Topics  . Alcohol use: Yes    Alcohol/week: 1.2 oz    Types: 2 Glasses of wine per week    Comment: daily  . Drug use: No    No Known Allergies  Current Outpatient Medications  Medication Sig Dispense Refill  . ALPRAZolam (XANAX) 0.5 MG tablet Take 1 tablet (0.5 mg total) by mouth at bedtime as needed. for sleep 30 tablet 1  . calcium carbonate (TUMS EX) 750 MG chewable tablet Chew 750 mg by mouth as needed.    . Calcium Carbonate-Vitamin D (CALCIUM-VITAMIN D) 500-200 MG-UNIT per tablet Take 1 tablet by mouth 2 (two) times daily with a meal.     . Fluticasone Propionate (KLS ALLER-FLO NA) Place 2 sprays into the nose daily.    . Lactobacillus (PROBIOTIC ACIDOPHILUS PO) Take by mouth daily.    . magnesium gluconate (MAGONATE) 500 MG tablet Take 250 mg by mouth daily.     . metoprolol succinate (TOPROL XL) 25 MG 24 hr tablet Take 0.5 tablets (12.5 mg total) by mouth daily. 30 tablet 3  . naproxen sodium (ANAPROX) 220 MG tablet Take 220 mg  by mouth as needed.    . psyllium (TGT PSYLLIUM FIBER) 0.52 g capsule Take 0.52 g by mouth daily.    Marland Kitchen SIMETHICONE-80 PO Take 80 mg by mouth as needed.     No current facility-administered medications for this visit.      Review of Systems Full ROS  was asked and was negative except for the information on the HPI  Physical Exam Blood pressure (!) 156/75, pulse (!) 47, temperature (!) 97.3 F (36.3 C), temperature source Oral, height _0  (1.727 m), weight 65.4 kg (144 lb 3.2 oz). CONSTITUTIONAL:NAD EYES: Pupils are equal, round, and reactive to light, Sclera are non-icteric. EARS, NOSE, MOUTH AND THROAT: The oropharynx is clear. The oral mucosa is pink and moist. Hearing is intact to voice. LYMPH NODES:  Lymph nodes in the neck are  normal. RESPIRATORY:  Lungs are clear. There is normal respiratory effort, with equal breath sounds bilaterally, and without pathologic use of accessory muscles. CARDIOVASCULAR: Heart is regular without murmurs, gallops, or rubs. BREAST: Symmetrical.  Previous biopsy site in the left side.  No evidence of any palpable masses.  Normal skin and nipple.  Both axillas are free of disease GI: The abdomen is  soft, nontender, and nondistended. There are no palpable masses. There is no hepatosplenomegaly. There are normal bowel sounds in all quadrants. GU: Rectal deferred.   MUSCULOSKELETAL: Normal muscle strength and tone. No cyanosis or edema.   SKIN: Turgor is good and there are no pathologic skin lesions or ulcers. NEUROLOGIC: Motor and sensation is grossly normal. Cranial nerves are grossly intact. PSYCH:  Oriented to person, place and time. Affect is normal.  Data Reviewed  I have personally reviewed the patient's imaging, laboratory findings and medical records.    Assessment/Plan 67 year old female with recently diagnosed DCIS on routine mammogram.  Discussed with the patient in detail about my fibrosis and about her disease process. Plan to her the option of lumpectomy plus radiation therapy. Detail about other options such as mastectomy. She is to see medical oncologist next week as well as radiation oncology. Discussed with her in detail about the surgical options of lumpectomy without sentinel lymph node biopsy.  Given that it is not a suspicious lesion and is with findings I do not think there is no need for sentinel node biopsy my thought process was explained to her in detail. Also a benefit from hormonal therapy as well. Discussed with patient detail about the procedure.  Risk benefit but medications including but not to: Bleeding, infection, or pathology on the need for sentinel lymph node biopsy or reexcision.  Also the need for radiation therapy and hormonal Rx. She understands and  wishes to proceed. Extensive counseling provided. We will schedule for lumpectomy needle guided next week. I will also recheck her K before surgery. Copy of this report will be sent to the referring provider  Caroleen Hamman, MD FACS General Surgeon 06/21/2018, 8:58 AM

## 2018-06-21 NOTE — Progress Notes (Signed)
Patient ID: Susan [name], female   DOB: 12/28/1950, 67 y.o.   MRN: [id number]  HPI Susan [name] is a 67 y.o. female in consultation at the request of Dr.Wei-Chen Li.  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  She underwent stereotactic core needle biopsy no suspicious calcifications.  Please note that I have personally reviewed the ultrasound as well as the mammograms.  Patient area was around 2 x 2.4 cm. A marker was placed 6 cm superior to the biopsy site. The patient denied any previous symptoms.  No breast pain no breast masses no breast discharge or changes with the skin or nipple. Does have a history of precautions with breast cancer and one of them w BRCA positive. No First degree relatives w breast CA. She is menopausal. She did have a right breast biopsy for benign disease several years ago. Denies any hormonal therapy.  Does have a history of mitral valve regurg.  She is on a beta-blocker but no evidence of A. Fib. She Is able to perform more than 6 METS of activity without any shortness of breath or chest pain. Recent CBC and CMP are completely normal except her k (high). Pathology:ATYPICAL SCLEROSING PAPILLARY LESION WITH CALCIFICATIONS.  - AREAS OF DUCTAL CARCINOMA IN SITU, INTERMEDIATE NUCLEAR GRADE ER + PR (-)      HPI  Past Medical History:  Diagnosis Date  . Gastric reflux 90's  . Hiatal hernia 2003  . Mitral valve regurgitation     Past Surgical History:  Procedure Laterality Date  . APPENDECTOMY    . BREAST BIOPSY Left 06/13/2018   Affirm Bx- coil clip-path pending  . BREAST EXCISIONAL BIOPSY Right 2000   benign  . CHOLECYSTECTOMY    . TONSILLECTOMY     age 12  . UPPER GI ENDOSCOPY  2003    Family History  Problem Relation Age of Onset  . Hyperlipidemia Mother   . Hypertension Mother   . COPD Mother   . Heart failure Mother   . Stroke Father   . Hypertension Father   .  Hypertension Sister   . Hypertension Brother   . Healthy Brother     Social History Social History   Tobacco Use  . Smoking status: Never Smoker  . Smokeless tobacco: Never Used  Substance Use Topics  . Alcohol use: Yes    Alcohol/week: 1.2 oz    Types: 2 Glasses of wine per week    Comment: daily  . Drug use: No    No Known Allergies  Current Outpatient Medications  Medication Sig Dispense Refill  . ALPRAZolam (XANAX) 0.5 MG tablet Take 1 tablet (0.5 mg total) by mouth at bedtime as needed. for sleep 30 tablet 1  . calcium carbonate (TUMS EX) 750 MG chewable tablet Chew 750 mg by mouth as needed.    . Calcium Carbonate-Vitamin D (CALCIUM-VITAMIN D) 500-200 MG-UNIT per tablet Take 1 tablet by mouth 2 (two) times daily with a meal.     . Fluticasone Propionate (KLS ALLER-FLO NA) Place 2 sprays into the nose daily.    . Lactobacillus (PROBIOTIC ACIDOPHILUS PO) Take by mouth daily.    . magnesium gluconate (MAGONATE) 500 MG tablet Take 250 mg by mouth daily.     . metoprolol succinate (TOPROL XL) 25 MG 24 hr tablet Take 0.5 tablets (12.5 mg total) by mouth daily. 30 tablet 3  . naproxen sodium (ANAPROX) 220 MG tablet Take 220 mg   by mouth as needed.    . psyllium (TGT PSYLLIUM FIBER) 0.52 g capsule Take 0.52 g by mouth daily.    . SIMETHICONE-80 PO Take 80 mg by mouth as needed.     No current facility-administered medications for this visit.      Review of Systems Full ROS  was asked and was negative except for the information on the HPI  Physical Exam Blood pressure (!) 156/75, pulse (!) 47, temperature (!) 97.3 F (36.3 C), temperature source Oral, height 5' 8" (1.727 m), weight 65.4 kg (144 lb 3.2 oz). CONSTITUTIONAL:NAD EYES: Pupils are equal, round, and reactive to light, Sclera are non-icteric. EARS, NOSE, MOUTH AND THROAT: The oropharynx is clear. The oral mucosa is pink and moist. Hearing is intact to voice. LYMPH NODES:  Lymph nodes in the neck are  normal. RESPIRATORY:  Lungs are clear. There is normal respiratory effort, with equal breath sounds bilaterally, and without pathologic use of accessory muscles. CARDIOVASCULAR: Heart is regular without murmurs, gallops, or rubs. BREAST: Symmetrical.  Previous biopsy site in the left side.  No evidence of any palpable masses.  Normal skin and nipple.  Both axillas are free of disease GI: The abdomen is  soft, nontender, and nondistended. There are no palpable masses. There is no hepatosplenomegaly. There are normal bowel sounds in all quadrants. GU: Rectal deferred.   MUSCULOSKELETAL: Normal muscle strength and tone. No cyanosis or edema.   SKIN: Turgor is good and there are no pathologic skin lesions or ulcers. NEUROLOGIC: Motor and sensation is grossly normal. Cranial nerves are grossly intact. PSYCH:  Oriented to person, place and time. Affect is normal.  Data Reviewed  I have personally reviewed the patient's imaging, laboratory findings and medical records.    Assessment/Plan 67-year-old female with recently diagnosed DCIS on routine mammogram.  Discussed with the patient in detail about my fibrosis and about her disease process. Plan to her the option of lumpectomy plus radiation therapy. Detail about other options such as mastectomy. She is to see medical oncologist next week as well as radiation oncology. Discussed with her in detail about the surgical options of lumpectomy without sentinel lymph node biopsy.  Given that it is not a suspicious lesion and is with findings I do not think there is no need for sentinel node biopsy my thought process was explained to her in detail. Also a benefit from hormonal therapy as well. Discussed with patient detail about the procedure.  Risk benefit but medications including but not to: Bleeding, infection, or pathology on the need for sentinel lymph node biopsy or reexcision.  Also the need for radiation therapy and hormonal Rx. She understands and  wishes to proceed. Extensive counseling provided. We will schedule for lumpectomy needle guided next week. I will also recheck her K before surgery. Copy of this report will be sent to the referring provider  Diego Pabon, MD FACS General Surgeon 06/21/2018, 8:58 AM   

## 2018-06-21 NOTE — Telephone Encounter (Signed)
Pt advised of pre op date/time and sx date. Sx: 06/26/18 with Dr Viann Shove lumpectomy with NL.  Pre op: 06/22/18 between 9-1:00pm--phone interview.  Patient made aware to arrive at 7:45am the day of surgery.   Patient understands all information.

## 2018-06-22 ENCOUNTER — Other Ambulatory Visit: Payer: Self-pay

## 2018-06-22 ENCOUNTER — Encounter
Admission: RE | Admit: 2018-06-22 | Discharge: 2018-06-22 | Disposition: A | Discharge status: Home / Self Care | Payer: PPO | Source: Ambulatory Visit | Attending: Surgery | Admitting: Surgery

## 2018-06-22 HISTORY — DX: Headache, unspecified: R51.9

## 2018-06-22 HISTORY — DX: Adverse effect of unspecified anesthetic, initial encounter: T41.45XA

## 2018-06-22 HISTORY — DX: Supraventricular tachycardia: I47.1

## 2018-06-22 HISTORY — DX: Other specified postprocedural states: Z98.890

## 2018-06-22 HISTORY — DX: Other specified postprocedural states: R11.2

## 2018-06-22 HISTORY — DX: Other complications of anesthesia, initial encounter: T88.59XA

## 2018-06-22 HISTORY — DX: Unspecified osteoarthritis, unspecified site: M19.90

## 2018-06-22 HISTORY — DX: Headache: R51

## 2018-06-22 HISTORY — DX: Gastro-esophageal reflux disease without esophagitis: K21.9

## 2018-06-22 HISTORY — DX: Malignant (primary) neoplasm, unspecified: C80.1

## 2018-06-22 HISTORY — DX: Cardiac arrhythmia, unspecified: I49.9

## 2018-06-22 NOTE — Patient Instructions (Signed)
Your procedure is scheduled on: 06-26-18 TUESDAY Report to [facility] @ 7:45 AM  Remember: Instructions that are not followed completely may result in serious medical risk, up to and including death, or upon the discretion of your surgeon and anesthesiologist your surgery may need to be rescheduled.    _x___ 1. Do not eat food after midnight the night before your procedure. NO GUM OR CANDY AFTER MIDNIGHT.  You may drink clear liquids up to 2 hours before you are scheduled to arrive at the hospital for your procedure.  Do not drink clear liquids within 2 hours of your scheduled arrival to the hospital.  Clear liquids include  --Water or Apple juice without pulp  --Clear carbohydrate beverage such as ClearFast or Gatorade  --Black Coffee or Clear Tea (No milk, no creamers, do not add anything to the coffee or Tea     __x__ 2. No Alcohol for 24 hours before or after surgery.   __x__3. No Smoking or e-cigarettes for 24 prior to surgery.  Do not use any chewable tobacco products for at least 6 hour prior to surgery   ____  4. Bring all medications with you on the day of surgery if instructed.    __x__ 5. Notify your doctor if there is any change in your medical condition     (cold, fever, infections).    x___6. On the morning of surgery brush your teeth with toothpaste and water.  You may rinse your mouth with mouth wash if you wish.  Do not swallow any toothpaste or mouthwash.   Do not wear jewelry, make-up, hairpins, clips or nail polish.  Do not wear lotions, powders, or perfumes. You may wear deodorant.  Do not shave 48 hours prior to surgery. Men may shave face and neck.  Do not bring valuables to the hospital.    [facility] is not responsible for any belongings or valuables.               Contacts, dentures or bridgework may not be worn into surgery.  Leave your suitcase in the car. After surgery it may be brought to your room.  For patients admitted to the hospital,  discharge time is determined by your treatment team.  _  Patients discharged the day of surgery will not be allowed to drive home.  You will need someone to drive you home and stay with you the night of your procedure.    Please read over the following fact sheets that you were given:   [facility] Preparing for Surgery   _x___ TAKE THE FOLLOWING MEDICATION THE MORNING OF YOUR SURGERY WITH A SMALL SIP OF WATER. These include:  1. METOPROLOL  2. MAGNESIUM  3. ZANTAC (RANITIDINE)  4. TAKE AN EXTRA ZANTAC THE NIGHT BEFORE YOUR SURGERY  5.  6.  ____Fleets enema or Magnesium Citrate as directed.   _x___ Use CHG Soap or sage wipes as directed on instruction sheet   ____ Use inhalers on the day of surgery and bring to hospital day of surgery  ____ Stop Metformin and Janumet 2 days prior to surgery.    ____ Take 1/2 of usual insulin dose the night before surgery and none on the morning surgery.   ____ Follow recommendations from Cardiologist, Pulmonologist or PCP regarding stopping Aspirin, Coumadin, Plavix ,Eliquis, Effient, or Pradaxa, and Pletal.  X____Stop Anti-inflammatories such as Advil, Aleve, Ibuprofen, Motrin, Naproxen, Naprosyn, Goodies powders or aspirin products NOW-OK to take Tylenol   _x___ Stop  supplements until after surgery.  But may continue Vitamin D, Vitamin B,       and multivitamin.   ____ Bring C-Pap to the hospital.

## 2018-06-22 NOTE — Pre-Procedure Instructions (Signed)
Susan [name]  ECHO COMPLETE WO IMAGING ENHANCING AGENT  Order# [id number]  Reading physician: Minna Merritts, MD Ordering physician: Minna Merritts, MD Study date: 04/18/18  Result Notes for ECHOCARDIOGRAM COMPLETE   Notes recorded by Vanessa Ralphs, RN on 04/19/2018 at 7:37 AM EDT Pt has reviewed results in My Chart.  ------  Notes recorded by Minna Merritts, MD on 04/18/2018 at 10:04 PM EDT Echo No change in mitral valve regurgitation Still mild to moderate Otherwise normal cardiac function      Study Result   Result status: Final result                           *[facility]                        [place], [place] [address]                            [phone]  ------------------------------------------------------------------- Transthoracic Echocardiography  Patient:    Susan [name], Susan [name] MR #:       [id number] Study Date: 04/18/2018 Gender:     F Age:        67 Height:     172.7 cm Weight:     70.8 kg BSA:        1.85 m^2 Pt. Status: Room:   REFERRING    Esmond Plants, MD, [place]  SONOGRAPHER  Pilar Jarvis, RVT, RDCS, RDMS  ATTENDING    Wende Bushy, MD  cc:  ------------------------------------------------------------------- LV EF: 60% -   65%  ------------------------------------------------------------------- History:   PMH:   Murmur.  Mitral valve disease.  Risk factors: Lifelong nonsmoker. Hypertension.  ------------------------------------------------------------------- Study Conclusions  - Left ventricle: The cavity size was normal. Systolic function was   normal. The estimated ejection fraction was in the range of 60%   to 65%. Wall motion was normal; there were no regional wall   motion abnormalities. Features are consistent with a pseudonormal   left ventricular filling pattern, with  concomitant abnormal   relaxation and increased filling pressure (grade 2 diastolic   dysfunction). - Mitral valve: Mildly thickened leaflets . Suspect mild prolapse   of the anterior leaflet There was mild to moderate regurgitation. - Left atrium: The atrium was at the upper limits of normal in   size. - Right ventricle: Systolic function was normal. - Tricuspid valve: There was mild-moderate regurgitation. - Pulmonary arteries: Systolic pressure was within the normal   range.  ------------------------------------------------------------------- Study data:  The previous study was not available, so comparison was made to the report of April 2018.  Study status:  Routine. Procedure:  Transthoracic echocardiography. Image quality was fair.  Study completion:  There were no complications. Transthoracic echocardiography.  M-mode, complete 2D, spectral Doppler, and color Doppler.  Birthdate:  Patient birthdate: 12-25-1951.  Age:  Patient is 67 yr old.  Sex:  Gender: female. BMI: 23.7 kg/m^2.  Blood pressure:     132/78  Patient status: Outpatient.  Study date:  Study date: 04/18/2018. Study time: 06:43 AM.  -------------------------------------------------------------------  ------------------------------------------------------------------- Left ventricle:  The cavity size was normal. Systolic function was  normal. The estimated ejection fraction was in the range of 60% to 65%. Wall motion was normal; there were no regional wall motion abnormalities. Features are consistent with a pseudonormal left ventricular filling pattern, with concomitant abnormal relaxation and increased filling pressure (grade 2 diastolic dysfunction).  ------------------------------------------------------------------- Aortic valve:   Structurally normal valve. Trileaflet. Cusp separation was normal. Mobility was not restricted.  Doppler: Transvalvular velocity was within the normal range. There was  no stenosis. There was no regurgitation.    VTI ratio of LVOT to aortic valve: 0.87. Valve area (VTI): 3.31 cm^2. Indexed valve area (VTI): 1.79 cm^2/m^2. Mean velocity ratio of LVOT to aortic valve: 0.81. Valve area (Vmean): 3.06 cm^2. Indexed valve area (Vmean): 1.66 cm^2/m^2.    Mean gradient (S): 4 mm Hg.  ------------------------------------------------------------------- Aorta:  Aortic root: The aortic root was normal in size.  ------------------------------------------------------------------- Mitral valve:  Poorly visualized. Mildly thickened leaflets . Suspect mild prolapse of the anterior leaflet Mobility was not restricted.  Doppler:  Transvalvular velocity was within the normal range. There was no evidence for stenosis. There was mild to moderate regurgitation.    Valve area by pressure half-time: 3.67 cm^2. Indexed valve area by pressure half-time: 1.99 cm^2/m^2.  ------------------------------------------------------------------- Left atrium:  The atrium was at the upper limits of normal in size.   ------------------------------------------------------------------- Right ventricle:  The cavity size was normal. Wall thickness was normal. Systolic function was normal.  ------------------------------------------------------------------- Pulmonic valve:    Structurally normal valve.   Cusp separation was normal.  Doppler:  Transvalvular velocity was within the normal range. There was no evidence for stenosis. There was no regurgitation.  ------------------------------------------------------------------- Tricuspid valve:   Structurally normal valve.    Doppler: Transvalvular velocity was within the normal range. There was mild-moderate regurgitation.  ------------------------------------------------------------------- Pulmonary artery:   The main pulmonary artery was normal-sized. Systolic pressure was within the normal  range.  ------------------------------------------------------------------- Right atrium:  The atrium was normal in size.  ------------------------------------------------------------------- Pericardium:  There was no pericardial effusion.  ------------------------------------------------------------------- Systemic veins: Inferior vena cava: The vessel was normal in size.  ------------------------------------------------------------------- Measurements   Left ventricle                           Value          Reference  LV ID, ED, PLAX chordal                  47    mm       43 - 52  LV ID, ES, PLAX chordal                  30    mm       23 - 38  LV fx shortening, PLAX chordal           36    %        >=29  LV PW thickness, ED                      8     mm       ----------  IVS/LV PW ratio, ED                      1.13           <=1.3  Stroke volume, 2D  97    ml       ----------  Stroke volume/bsa, 2D                    52    ml/m^2   ----------  LV ejection fraction, 1-p A4C            56    %        ----------  LV end-diastolic volume, 2-p             74    ml       ----------  LV end-systolic volume, 2-p              31    ml       ----------  LV ejection fraction, 2-p                58    %        ----------  Stroke volume, 2-p                       43    ml       ----------  LV end-diastolic volume/bsa, 2-p         40    ml/m^2   ----------  LV end-systolic volume/bsa, 2-p          17    ml/m^2   ----------  Stroke volume/bsa, 2-p                   23.2  ml/m^2   ----------  LV e&', lateral                           9.57  cm/s     ----------  LV E/e&', lateral                         6.39           ----------  LV e&', medial                            5.77  cm/s     ----------  LV E/e&', medial                          10.61          ----------  LV e&', average                           7.67  cm/s     ----------  LV E/e&', average                          7.98           ----------    Ventricular septum                       Value          Reference  IVS thickness, ED                        9     mm       ----------    LVOT  Value          Reference  LVOT ID, S                               22    mm       ----------  LVOT area                                3.8   cm^2     ----------  LVOT ID                                  22    mm       ----------  LVOT mean velocity, S                    74.3  cm/s     ----------  LVOT VTI, S                              25.6  cm       ----------  Stroke volume (SV), LVOT DP              97.3  ml       ----------  Stroke index (SV/bsa), LVOT DP           52.7  ml/m^2   ----------    Aortic valve                             Value          Reference  Aortic valve mean velocity, S            92.2  cm/s     ----------  Aortic valve VTI, S                      29.4  cm       ----------  Aortic mean gradient, S                  4     mm Hg    ----------  VTI ratio, LVOT/AV                       0.87           ----------  Aortic valve area, VTI                   3.31  cm^2     ----------  Aortic valve area/bsa, VTI               1.79  cm^2/m^2 ----------  Velocity ratio, mean, LVOT/AV            0.81           ----------  Aortic valve area, mean velocity         3.06  cm^2     ----------  Aortic valve area/bsa, mean              1.66  cm^2/m^2 ----------  velocity    Aorta  Value          Reference  Aortic root ID, ED                       29    mm       ----------  Ascending aorta ID, A-P, S               27    mm       ----------    Left atrium                              Value          Reference  LA ID, A-P, ES                           31    mm       ----------  LA ID/bsa, A-P                           1.68  cm/m^2   <=2.2  LA volume, S                             36.4  ml       ----------  LA volume/bsa, S                          19.7  ml/m^2   ----------  LA volume, ES, 1-p A4C                   22.8  ml       ----------  LA volume/bsa, ES, 1-p A4C               12.3  ml/m^2   ----------  LA volume, ES, 1-p A2C                   54.2  ml       ----------  LA volume/bsa, ES, 1-p A2C               29.3  ml/m^2   ----------    Mitral valve                             Value          Reference  Mitral E-wave peak velocity              61.2  cm/s     ----------  Mitral A-wave peak velocity              57.2  cm/s     ----------  Mitral deceleration time                 204   ms       150 - 230  Mitral pressure half-time                60    ms       ----------  Mitral E/A ratio, peak                   1.1            ----------  Mitral valve area, PHT, DP               3.67  cm^2     ----------  Mitral valve area/bsa, PHT, DP           1.99  cm^2/m^2 ----------    Right atrium                             Value          Reference  RA ID, S-I, ES, A4C                      48    mm       34 - 49  RA area, ES, A4C                         11.3  cm^2     8.3 - 19.5  RA volume, ES, A/L                       22.2  ml       ----------  RA volume/bsa, ES, A/L                   12    ml/m^2   ----------    Right ventricle                          Value          Reference  TAPSE                                    20.6  mm       ----------  RV s&', lateral, S                        10.4  cm/s     ----------    Pulmonic valve                           Value          Reference  Pulmonic valve peak velocity, S          78.1  cm/s     ----------  Legend: (L)  and  (H)  mark values outside specified reference range.  ------------------------------------------------------------------- Prepared and Electronically Authenticated by  Esmond Plants, MD, [facility] 2019-04-24T16:12:40  MERGE Images   Show images for ECHOCARDIOGRAM COMPLETE  Patient Information   Patient Name Susan [name], Susan [name] Sex Female DOB Sep 18, 1951  SSN [id number]  Reason for Exam  Priority: Routine  Dx: Murmur [R01.1 (ICD-10-CM)]  Comments: 1 Year FU Echo  Surgical History   Surgical History   No past medical history on file.    Other Surgical History   Procedure Laterality Date Comment Source  APPENDECTOMY    Provider  BREAST BIOPSY Left 06/13/2018 Affirm Bx- coil clip-path pending Provider  BREAST EXCISIONAL BIOPSY Right 2000 benign Provider  CHOLECYSTECTOMY    Provider  TONSILLECTOMY   age 62 Provider  UPPER GI ENDOSCOPY  2003  Provider    Performing Technologist/Nurse   Performing Technologist/Nurse: Baruch Goldmann  Implants    No active implants to display in this view.  Order-Level Documents:   There are no order-level documents.  Encounter-Level Documents - 04/18/2018:   Electronic signature on 04/18/2018 8:56 AM - Signed      Signed   Electronically signed by Minna Merritts, MD on 04/18/18 at 20 EDT  Printable Result Report   Result Report   External Result Report   External Result Report    Patient Result Comments   Viewed by [name] on 06/21/2018 2:00 PM  Written by Minna Merritts, MD on 04/18/2018 10:04 PM  Echo  No change in mitral valve regurgitation  Still mild to moderate  Otherwise normal cardiac function

## 2018-06-25 ENCOUNTER — Inpatient Hospital Stay: Payer: PPO | Attending: Hematology and Oncology | Admitting: Hematology and Oncology

## 2018-06-25 ENCOUNTER — Encounter: Payer: Self-pay | Admitting: Hematology and Oncology

## 2018-06-25 ENCOUNTER — Encounter
Admission: RE | Admit: 2018-06-25 | Discharge: 2018-06-25 | Disposition: A | Discharge status: Home / Self Care | Payer: PPO | Source: Ambulatory Visit | Attending: Surgery | Admitting: Surgery

## 2018-06-25 ENCOUNTER — Other Ambulatory Visit: Payer: Self-pay

## 2018-06-25 VITALS — BP 116/74 | HR 56 | Temp 98.0°F | Resp 18 | Ht 68.0 in | Wt 145.4 lb

## 2018-06-25 DIAGNOSIS — Z780 Asymptomatic menopausal state: Secondary | ICD-10-CM | POA: Insufficient documentation

## 2018-06-25 DIAGNOSIS — Z170 Estrogen receptor positive status [ER+]: Secondary | ICD-10-CM | POA: Diagnosis not present

## 2018-06-25 DIAGNOSIS — I471 Supraventricular tachycardia: Secondary | ICD-10-CM | POA: Diagnosis not present

## 2018-06-25 DIAGNOSIS — K219 Gastro-esophageal reflux disease without esophagitis: Secondary | ICD-10-CM | POA: Diagnosis not present

## 2018-06-25 DIAGNOSIS — D0512 Intraductal carcinoma in situ of left breast: Secondary | ICD-10-CM | POA: Insufficient documentation

## 2018-06-25 DIAGNOSIS — I340 Nonrheumatic mitral (valve) insufficiency: Secondary | ICD-10-CM | POA: Diagnosis not present

## 2018-06-25 DIAGNOSIS — Z01812 Encounter for preprocedural laboratory examination: Secondary | ICD-10-CM | POA: Diagnosis not present

## 2018-06-25 DIAGNOSIS — Z79899 Other long term (current) drug therapy: Secondary | ICD-10-CM | POA: Diagnosis not present

## 2018-06-25 DIAGNOSIS — Z803 Family history of malignant neoplasm of breast: Secondary | ICD-10-CM | POA: Diagnosis not present

## 2018-06-25 DIAGNOSIS — M1990 Unspecified osteoarthritis, unspecified site: Secondary | ICD-10-CM | POA: Diagnosis not present

## 2018-06-25 DIAGNOSIS — M85852 Other specified disorders of bone density and structure, left thigh: Secondary | ICD-10-CM | POA: Insufficient documentation

## 2018-06-25 DIAGNOSIS — Z01810 Encounter for preprocedural cardiovascular examination: Secondary | ICD-10-CM | POA: Diagnosis not present

## 2018-06-25 DIAGNOSIS — Z7951 Long term (current) use of inhaled steroids: Secondary | ICD-10-CM | POA: Diagnosis not present

## 2018-06-25 DIAGNOSIS — I341 Nonrheumatic mitral (valve) prolapse: Secondary | ICD-10-CM | POA: Diagnosis not present

## 2018-06-25 DIAGNOSIS — I10 Essential (primary) hypertension: Secondary | ICD-10-CM | POA: Diagnosis not present

## 2018-06-25 DIAGNOSIS — C50912 Malignant neoplasm of unspecified site of left female breast: Secondary | ICD-10-CM | POA: Diagnosis present

## 2018-06-25 LAB — BASIC METABOLIC PANEL
ANION GAP: 7 (ref 5–15)
BUN: 19 mg/dL (ref 8–23)
CALCIUM: 9 mg/dL (ref 8.9–10.3)
CO2: 27 mmol/L (ref 22–32)
CREATININE: 0.7 mg/dL (ref 0.44–1.00)
Chloride: 103 mmol/L (ref 98–111)
GFR calc Af Amer: >60 mL/min (ref >60)
GFR calc non Af Amer: >60 mL/min (ref >60)
GLUCOSE: 98 mg/dL (ref 70–99)
Potassium: 4.1 mmol/L (ref 3.5–5.1)
Sodium: 137 mmol/L (ref 135–145)

## 2018-06-25 MED ORDER — CEFAZOLIN SODIUM-DEXTROSE 2-4 GM/100ML-% IV SOLN
2.0000 g | INTRAVENOUS | Status: AC
  Administered 2018-06-26: 2 g via INTRAVENOUS

## 2018-06-25 NOTE — Progress Notes (Signed)
New patient referred by Dr Marjory Lies for DCIS.

## 2018-06-25 NOTE — Patient Instructions (Addendum)
CALL FOR AN APPOINTMENT AFTER YOU COMPLETE YOUR RADIATION THERAPY  Honor Loh, MSN, APRN, FNP-C, CEN Oncology/Hematology Nurse Practitioner  [facility]   Letrozole tablets What is this medicine? LETROZOLE (LET roe zole) blocks the production of estrogen. It is used to treat breast cancer. This medicine may be used for other purposes; ask your health care provider or pharmacist if you have questions. COMMON BRAND NAME(S): Femara What should I tell my health care provider before I take this medicine? They need to know if you have any of these conditions: -high cholesterol -liver disease -osteoporosis (weak bones) -an unusual or allergic reaction to letrozole, other medicines, foods, dyes, or preservatives -pregnant or trying to get pregnant -breast-feeding How should I use this medicine? Take this medicine by mouth with a glass of water. You may take it with or without food. Follow the directions on the prescription label. Take your medicine at regular intervals. Do not take your medicine more often than directed. Do not stop taking except on your doctor's advice. Talk to your pediatrician regarding the use of this medicine in children. Special care may be needed. Overdosage: If you think you have taken too much of this medicine contact a poison control center or emergency room at once. NOTE: This medicine is only for you. Do not share this medicine with others. What if I miss a dose? If you miss a dose, take it as soon as you can. If it is almost time for your next dose, take only that dose. Do not take double or extra doses. What may interact with this medicine? Do not take this medicine with any of the following medications: -estrogens, like hormone replacement therapy or birth control pills This medicine may also interact with the following medications: -dietary supplements such as androstenedione or DHEA -prasterone -tamoxifen This list may not  describe all possible interactions. Give your health care provider a list of all the medicines, herbs, non-prescription drugs, or dietary supplements you use. Also tell them if you smoke, drink alcohol, or use illegal drugs. Some items may interact with your medicine. What should I watch for while using this medicine? Tell your doctor or healthcare professional if your symptoms do not start to get better or if they get worse. Do not become pregnant while taking this medicine or for 3 weeks after stopping it. Women should inform their doctor if they wish to become pregnant or think they might be pregnant. There is a potential for serious side effects to an unborn child. Talk to your health care professional or pharmacist for more information. Do not breast-feed while taking this medicine or for 3 weeks after stopping it. This medicine may interfere with the ability to have a child. Talk with your doctor or health care professional if you are concerned about your fertility. Using this medicine for a long time may increase your risk of low bone mass. Talk to your doctor about bone health. You may get drowsy or dizzy. Do not drive, use machinery, or do anything that needs mental alertness until you know how this medicine affects you. Do not stand or sit up quickly, especially if you are an older patient. This reduces the risk of dizzy or fainting spells. You may need blood work done while you are taking this medicine. What side effects may I notice from receiving this medicine? Side effects that you should report to your doctor or health care professional as soon as possible: -allergic reactions like skin  rash, itching, or hives -bone fracture -chest pain -signs and symptoms of a blood clot such as breathing problems; changes in vision; chest pain; severe, sudden headache; pain, swelling, warmth in the leg; trouble speaking; sudden numbness or weakness of the face, arm or leg -vaginal bleeding Side  effects that usually do not require medical attention (report to your doctor or health care professional if they continue or are bothersome): -bone, back, joint, or muscle pain -dizziness -fatigue -fluid retention -headache -hot flashes, night sweats -nausea -weight gain This list may not describe all possible side effects. Call your doctor for medical advice about side effects. You may report side effects to FDA at 1-800-FDA-1088. Where should I keep my medicine? Keep out of the reach of children. Store between 15 and 30 degrees C (59 and 86 degrees F). Throw away any unused medicine after the expiration date. NOTE: This sheet is a summary. It may not cover all possible information. If you have questions about this medicine, talk to your doctor, pharmacist, or health care provider.  2018 Elsevier/Gold Standard (2016-07-18 11:10:41) Tamoxifen oral tablet What is this medicine? TAMOXIFEN (ta MOX i fen) blocks the effects of estrogen. It is commonly used to treat breast cancer. It is also used to decrease the chance of breast cancer coming back in women who have received treatment for the disease. It may also help prevent breast cancer in women who have a high risk of developing breast cancer. This medicine may be used for other purposes; ask your health care provider or pharmacist if you have questions. COMMON BRAND NAME(S): Nolvadex What should I tell my health care provider before I take this medicine? They need to know if you have any of these conditions: -blood clots -blood disease -cataracts or impaired eyesight -endometriosis -high calcium levels -high cholesterol -irregular menstrual cycles -liver disease -stroke -uterine fibroids -an unusual reaction to tamoxifen, other medicines, foods, dyes, or preservatives -pregnant or trying to get pregnant -breast-feeding How should I use this medicine? Take this medicine by mouth with a glass of water. Follow the directions on the  prescription label. You can take it with or without food. Take your medicine at regular intervals. Do not take your medicine more often than directed. Do not stop taking except on your doctor's advice. A special MedGuide will be given to you by the pharmacist with each prescription and refill. Be sure to read this information carefully each time. Talk to your pediatrician regarding the use of this medicine in children. While this drug may be prescribed for selected conditions, precautions do apply. Overdosage: If you think you have taken too much of this medicine contact a poison control center or emergency room at once. NOTE: This medicine is only for you. Do not share this medicine with others. What if I miss a dose? If you miss a dose, take it as soon as you can. If it is almost time for your next dose, take only that dose. Do not take double or extra doses. What may interact with this medicine? Do not take this medicine with any of the following medications: -cisapride -certain medicines for irregular heart beat like dofetilide, dronedarone, quinidine -certain medicines for fungal infection like fluconazole, posaconazole -pimozide -saquinavir -thioridazine This medicine may also interact with the following medications: -aminoglutethimide -anastrozole -bromocriptine -chemotherapy drugs -female hormones, like estrogens and birth control pills -letrozole -medroxyprogesterone -phenobarbital -rifampin -warfarin This list may not describe all possible interactions. Give your health care provider a list of all the  medicines, herbs, non-prescription drugs, or dietary supplements you use. Also tell them if you smoke, drink alcohol, or use illegal drugs. Some items may interact with your medicine. What should I watch for while using this medicine? Visit your doctor or health care professional for regular checks on your progress. You will need regular pelvic exams, breast exams, and mammograms.  If you are taking this medicine to reduce your risk of getting breast cancer, you should know that this medicine does not prevent all types of breast cancer. If breast cancer or other problems occur, there is no guarantee that it will be found at an early stage. Do not become pregnant while taking this medicine or for 2 months after stopping this medicine. Stop taking this medicine if you get pregnant or think you are pregnant and contact your doctor. This medicine may harm your unborn baby. Women who can possibly become pregnant should use birth control methods that do not use hormones during tamoxifen treatment and for 2 months after therapy has stopped. Talk with your health care provider for birth control advice. Do not breast feed while taking this medicine. What side effects may I notice from receiving this medicine? Side effects that you should report to your doctor or health care professional as soon as possible: -allergic reactions like skin rash, itching or hives, swelling of the face, lips, or tongue -changes in vision -changes in your menstrual cycle -difficulty walking or talking -new breast lumps -numbness -pelvic pain or pressure -redness, blistering, peeling or loosening of the skin, including inside the mouth -signs and symptoms of a dangerous change in heartbeat or heart rhythm like chest pain, dizziness, fast or irregular heartbeat, palpitations, feeling faint or lightheaded, falls, breathing problems -sudden chest pain -swelling, pain or tenderness in your calf or leg -unusual bruising or bleeding -vaginal discharge that is bloody, brown, or rust -weakness -yellowing of the whites of the eyes or skin Side effects that usually do not require medical attention (report to your doctor or health care professional if they continue or are bothersome): -fatigue -hair loss, although uncommon and is usually mild -headache -hot flashes -impotence (in men) -nausea, vomiting  (mild) -vaginal discharge (white or clear) This list may not describe all possible side effects. Call your doctor for medical advice about side effects. You may report side effects to FDA at 1-800-FDA-1088. Where should I keep my medicine? Keep out of the reach of children. Store at room temperature between 20 and 25 degrees C (68 and 77 degrees F). Protect from light. Keep container tightly closed. Throw away any unused medicine after the expiration date. NOTE: This sheet is a summary. It may not cover all possible information. If you have questions about this medicine, talk to your doctor, pharmacist, or health care provider.  2018 Elsevier/Gold Standard (2016-07-01 07:27:41)

## 2018-06-25 NOTE — Progress Notes (Signed)
[facility] day:  06/25/2018  Chief Complaint: Susan [name] is a 67 y.o. 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 Arnette Norris in consultation for assessment and management.  HPI:  The patient notes an abnormal mammogram and right breast biopsy while living in [place].  Bilateral mammogram on 02/16/2016 revealed no evidence of malignancy.  Bilateral mammogram on 05/25/2018 revealed calcifications in the left breast and no suspicious findings in the right breast.  Diagnostic left mammogram and ultrasound on 06/06/2018 revealed indeterminate microcalcifications over the inner midportion of the left breast.  There were 2 adjacent groups of heterogeneous microcalcifications with some linear forms spanning 1.1 x 2.0 x 2.4 cm.   She underwent breast biopsy and clip placement on 06/13/2018.  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  DCIS was ER positive (>90%) and PR negative (< 1%).  She saw Dr. Caroleen Hamman on 06/20/2018.  Discussion were held regarding lumpectomy following by radiation.    She is postmenopausal.  Menses was at age 39.  She is nulligravid.  She was on birth control medications for 15 years.  Additionally, she was on hormonal replacement for 5 years. She began menopause at age 71.   Bone density on 02/10/2016 revealed osteopenia with a T-score of - 1.9 in the left femoral neck and -0.8 in the AP spine L1-L4.  Patient has "one and a half ovaries".  She notes that she had a ruptured ovarian cyst. Patient's last colonoscopy was at age 53. She has negative Cologuard testing since that time.   Symptomatically, she is "fine". She has no acute complaints. Patient has palpitations related to her mitral valve prolapse. She denies fevers, sweats, or weight loss. She denies bruising and bleeding.  Patient does not verbalize any  concerns with regards to her breasts today. Patient performs monthly self breast examinations as recommended.   Patient has a family history that is significant for breast cancer. She has 2 maternal cousins (age 48 and 64) and 1 paternal cousin (age 59) who had breast cancer.    Past Medical History:  Diagnosis Date  . Arthritis   . Cancer ([place])   . Complication of anesthesia   . Dysrhythmia   . Gastric reflux 90's  . GERD (gastroesophageal reflux disease)   . Headache    H/O MIGRAINES  . Hiatal hernia 2003  . Hypertension   . Mitral valve regurgitation   . PONV (postoperative nausea and vomiting)    N/V  . SVT (supraventricular tachycardia) ([facility])     Past Surgical History:  Procedure Laterality Date  . APPENDECTOMY    . BREAST BIOPSY Left 06/13/2018   Affirm Bx- coil clip-path pending  . BREAST EXCISIONAL BIOPSY Right 2000   benign  . CHOLECYSTECTOMY    . OVARIAN CYST SURGERY    . TONSILLECTOMY     age 54  . UPPER GI ENDOSCOPY  2003    Family History  Problem Relation Age of Onset  . Hyperlipidemia Mother   . Hypertension Mother   . COPD Mother   . Heart failure Mother   . Stroke Father   . Hypertension Father   . Hypertension Sister   . Hypertension Brother   . Healthy Brother   . Breast cancer Cousin   . Breast cancer Cousin   . Breast cancer Cousin     Social History:  reports  that she has never smoked. She has never used smokeless tobacco. She reports that she drinks about 1.2 oz of alcohol per week. She reports that she does not use drugs. She denies tobacco use. She drinks wine with dinner. Patient is a [place] native, however has spent the last several years in [place]. Patient is a retired Administrator, arts. Patient denies known exposures to radiation on toxins.  The patient is accompanied by her husband, Delfino Lovett, today.  Allergies: No Known Allergies  Current Medications: Current Outpatient Medications  Medication Sig Dispense Refill  . ALPRAZolam  (XANAX) 0.5 MG tablet Take 1 tablet (0.5 mg total) by mouth at bedtime as needed. for sleep 30 tablet 1  . calcium carbonate (TUMS EX) 750 MG chewable tablet Chew 750 mg by mouth as needed for heartburn.     . calcium citrate-vitamin D 500-500 MG-UNIT chewable tablet Chew 1 tablet by mouth daily.    . cholecalciferol (VITAMIN D) 1000 units tablet Take 1,000 Units by mouth daily.    . fluticasone (FLONASE) 50 MCG/ACT nasal spray Place 2 sprays into both nostrils as needed.     . Lactobacillus (PROBIOTIC ACIDOPHILUS PO) Take 1 capsule by mouth daily.     . Magnesium 250 MG TABS Take 250 mg by mouth every morning.     . metoprolol succinate (TOPROL XL) 25 MG 24 hr tablet Take 0.5 tablets (12.5 mg total) by mouth daily. (Patient taking differently: Take 12.5 mg by mouth every morning. ) 30 tablet 3  . naproxen sodium (ANAPROX) 220 MG tablet Take 220 mg by mouth daily as needed (pain).     . psyllium (TGT PSYLLIUM FIBER) 0.52 g capsule Take 0.52 g by mouth 3 (three) times daily.     . ranitidine (ZANTAC) 150 MG tablet Take 150 mg by mouth daily as needed for heartburn.    Marland Kitchen SIMETHICONE-80 PO Take 80 mg by mouth as needed (gas).      No current facility-administered medications for this visit.     Review of Systems:  GENERAL:  Feels fine.  No fevers, sweats or weight loss. PERFORMANCE STATUS (ECOG):  0 HEENT:  No visual changes, runny nose, sore throat, mouth sores or tenderness. Lungs: No shortness of breath or cough.  No hemoptysis. Cardiac:  Palpitations secondary to MVP.  No chest pain, orthopnea, or PND. GI:  No nausea, vomiting, diarrhea, constipation, melena or hematochezia.  Colonoscopy age 39.  Cologuard last year.   GU:  No urgency, frequency, dysuria, or hematuria. Musculoskeletal:  No back pain.  No joint pain.  No muscle tenderness. Extremities:  No pain or swelling. Skin:  No rashes or skin changes. Neuro:  No headache, numbness or weakness, balance or coordination  issues. Endocrine:  No diabetes, thyroid issues, or night sweats.  Every now and then a flush. Psych:  No mood changes, depression or anxiety. Pain:  No focal pain. Review of systems:  All other systems reviewed and found to be negative.  Physical Exam: Blood pressure 116/74, pulse (!) 56, temperature 98 F (36.7 C), temperature source Tympanic, resp. rate 18, height 5' 8" (1.727 m), weight 145 lb 6 oz (65.9 kg). GENERAL:  Well developed, well nourished, woman sitting comfortably in the exam room in no acute distress. MENTAL STATUS:  Alert and oriented to person, place and time. HEAD:  Short gray hair.  Normocephalic, atraumatic, face symmetric, no Cushingoid features. EYES:  Glasses.  Brown eyes.  Pupils equal round and reactive to light and accomodation.  No conjunctivitis or scleral icterus. ENT:  Oropharynx clear without lesion.  Tongue normal. Mucous membranes moist.  RESPIRATORY:  Clear to auscultation without rales, wheezes or rhonchi. CARDIOVASCULAR:  Regular rate and rhythm without murmur, rub or gallop. BREAST:  Right breast  Upper outer quadrant faint well healed incision (old) with underlying fibrocystic changes.  No discrete masses, skin changes or nipple discharge.  Left breast bruising with post-biopsy change medially.  No discrete masses, skin changes or nipple discharge.  ABDOMEN:  Soft, non-tender, with active bowel sounds, and no hepatosplenomegaly.  No masses. SKIN:  No rashes, ulcers or lesions. EXTREMITIES: No edema, no skin discoloration or tenderness.  No palpable cords. LYMPH NODES: No palpable cervical, supraclavicular, axillary or inguinal adenopathy  NEUROLOGICAL: Unremarkable. PSYCH:  Appropriate.   Hospital Outpatient Visit on 06/25/2018  Component Date Value Ref Range Status  . Sodium 06/25/2018 137  135 - 145 mmol/L Final  . Potassium 06/25/2018 4.1  3.5 - 5.1 mmol/L Final  . Chloride 06/25/2018 103  98 - 111 mmol/L Final   Please note change in reference  range.  . CO2 06/25/2018 27  22 - 32 mmol/L Final  . Glucose, Bld 06/25/2018 98  70 - 99 mg/dL Final   Please note change in reference range.  . BUN 06/25/2018 19  8 - 23 mg/dL Final   Please note change in reference range.  . Creatinine, Ser 06/25/2018 0.70  0.44 - 1.00 mg/dL Final  . Calcium 06/25/2018 9.0  8.9 - 10.3 mg/dL Final  . GFR calc non Af Amer 06/25/2018 >60  >60 mL/min Final  . GFR calc Af Amer 06/25/2018 >60  >60 mL/min Final   Comment: (NOTE) The eGFR has been calculated using the CKD EPI equation. This calculation has not been validated in all clinical situations. eGFR's persistently <60 mL/min signify possible Chronic Kidney Disease.   Georgiann Hahn gap 06/25/2018 7  5 - 15 Final   Performed at [facility], [address] [place],  [id number]    Assessment:  Susan [name] is a 67 y.o. 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  DCIS was ER positive (>90%) and PR negative (< 1%).  Diagnostic left mammogram on 06/06/2018 revealed indeterminate microcalcifications over the inner midportion of the left breast.  There were 2 adjacent groups of heterogeneous microcalcifications with some linear forms spanning 1.1 x 2.0 x 2.4 cm.   Symptomatically, she is doing well. She denies acute concerns today. Exam reveals post-biopsy changes.  Plan: 1.   Labs today: CBC with diff, CMP 2.   Discuss DCIS diagnosis. Patient has met with surgery and is planned to undergo LEFT breast lumpectomy on 06/26/2018.  3.   Discuss treatment with hormonal therapy. Discussed treatment with tamoxifen vs. an aromatase inhibitor.  Patient provided with printed information today on her AVS for review at home. Patient encouraged to make a list of questions and/or concerns for further discussion prior to beginning therapy using this  medication.   Tamoxifen -  discussed potential side effects (hot flashes, weight gain, increased LFTs, cataract formation, thrombosis, uterine cancer) of tamoxifen reviewed in detail. Discussed requirements associated with this medication, which include annual eye and pelvic examinations.  Aromatase inhibitor - discussed potential side effects (arthralgia, hot flashes, bone thinning) in detail.   4.   Patient provided with printed information today on her AVS for  review at home. Patient encouraged to make a list of questions and/or concerns for further discussion prior to beginning therapy using this medication.  5.   Patient is post menopausal. Discussed treatment with letrozole. Side effects reviewed.  6.   Refer to radiation oncology to discuss post surgical (lumpectomy) radiation.  7.   RTC after radiation therapy to initiate hormonal therapy- will need to call for an appointment.    Honor Loh, NP  06/25/2018, 2:16 PM   I saw and evaluated the patient, participating in the key portions of the service and reviewing pertinent diagnostic studies and records.  I reviewed the nurse practitioner's note and agree with the findings and the plan.  The assessment and plan were discussed with the patient.  Multiple questions were asked by the patient and answered.   Nolon Stalls, MD 06/25/2018, 2:16 PM

## 2018-06-26 ENCOUNTER — Ambulatory Visit: Payer: PPO | Admitting: Certified Registered"

## 2018-06-26 ENCOUNTER — Ambulatory Visit
Admission: RE | Admit: 2018-06-26 | Discharge: 2018-06-26 | Disposition: A | Discharge status: Home / Self Care | Payer: PPO | Source: Ambulatory Visit | Attending: Surgery | Admitting: Surgery

## 2018-06-26 ENCOUNTER — Encounter
Admission: RE | Disposition: A | Discharge status: Home / Self Care | Payer: Self-pay | Source: Ambulatory Visit | Attending: Surgery

## 2018-06-26 ENCOUNTER — Other Ambulatory Visit: Payer: PPO

## 2018-06-26 ENCOUNTER — Other Ambulatory Visit: Payer: Self-pay

## 2018-06-26 ENCOUNTER — Ambulatory Visit: Payer: PPO

## 2018-06-26 ENCOUNTER — Encounter: Payer: Self-pay | Admitting: Radiology

## 2018-06-26 DIAGNOSIS — R921 Mammographic calcification found on diagnostic imaging of breast: Secondary | ICD-10-CM | POA: Diagnosis not present

## 2018-06-26 DIAGNOSIS — D0512 Intraductal carcinoma in situ of left breast: Secondary | ICD-10-CM

## 2018-06-26 DIAGNOSIS — Z7951 Long term (current) use of inhaled steroids: Secondary | ICD-10-CM | POA: Insufficient documentation

## 2018-06-26 DIAGNOSIS — M1990 Unspecified osteoarthritis, unspecified site: Secondary | ICD-10-CM | POA: Insufficient documentation

## 2018-06-26 DIAGNOSIS — I340 Nonrheumatic mitral (valve) insufficiency: Secondary | ICD-10-CM | POA: Insufficient documentation

## 2018-06-26 DIAGNOSIS — Z01810 Encounter for preprocedural cardiovascular examination: Secondary | ICD-10-CM | POA: Insufficient documentation

## 2018-06-26 DIAGNOSIS — K219 Gastro-esophageal reflux disease without esophagitis: Secondary | ICD-10-CM | POA: Insufficient documentation

## 2018-06-26 DIAGNOSIS — I10 Essential (primary) hypertension: Secondary | ICD-10-CM | POA: Insufficient documentation

## 2018-06-26 DIAGNOSIS — Z79899 Other long term (current) drug therapy: Secondary | ICD-10-CM | POA: Insufficient documentation

## 2018-06-26 DIAGNOSIS — Z01812 Encounter for preprocedural laboratory examination: Secondary | ICD-10-CM | POA: Insufficient documentation

## 2018-06-26 HISTORY — PX: BREAST LUMPECTOMY WITH NEEDLE LOCALIZATION: SHX5759

## 2018-06-26 HISTORY — PX: BREAST LUMPECTOMY: SHX2

## 2018-06-26 SURGERY — BREAST LUMPECTOMY WITH NEEDLE LOCALIZATION
Anesthesia: General | Laterality: Left

## 2018-06-26 MED ORDER — PROPOFOL 10 MG/ML IV BOLUS
INTRAVENOUS | Status: AC
  Filled 2018-06-26: qty 20

## 2018-06-26 MED ORDER — DEXAMETHASONE SODIUM PHOSPHATE 10 MG/ML IJ SOLN
INTRAMUSCULAR | Status: DC | PRN
  Administered 2018-06-26: 6 mg via INTRAVENOUS

## 2018-06-26 MED ORDER — ACETAMINOPHEN 500 MG PO TABS
1000.0000 mg | ORAL_TABLET | ORAL | Status: AC
  Administered 2018-06-26: 1000 mg via ORAL

## 2018-06-26 MED ORDER — GABAPENTIN 300 MG PO CAPS
ORAL_CAPSULE | ORAL | Status: AC
  Administered 2018-06-26: 300 mg via ORAL
  Filled 2018-06-26: qty 1

## 2018-06-26 MED ORDER — CELECOXIB 200 MG PO CAPS
200.0000 mg | ORAL_CAPSULE | ORAL | Status: AC
  Administered 2018-06-26: 200 mg via ORAL

## 2018-06-26 MED ORDER — MIDAZOLAM HCL 2 MG/2ML IJ SOLN
INTRAMUSCULAR | Status: DC | PRN
  Administered 2018-06-26: 2 mg via INTRAVENOUS

## 2018-06-26 MED ORDER — LACTATED RINGERS IV SOLN
INTRAVENOUS | Status: DC
  Administered 2018-06-26: 09:00:00 via INTRAVENOUS

## 2018-06-26 MED ORDER — EPHEDRINE SULFATE 50 MG/ML IJ SOLN
INTRAMUSCULAR | Status: DC | PRN
  Administered 2018-06-26: 10 mg via INTRAVENOUS

## 2018-06-26 MED ORDER — ISOSULFAN BLUE 1 % ~~LOC~~ SOLN
SUBCUTANEOUS | Status: AC
  Filled 2018-06-26: qty 5

## 2018-06-26 MED ORDER — SCOPOLAMINE 1 MG/3DAYS TD PT72
1.0000 | MEDICATED_PATCH | TRANSDERMAL | Status: DC
  Administered 2018-06-26: 1.5 mg via TRANSDERMAL

## 2018-06-26 MED ORDER — CEFAZOLIN SODIUM-DEXTROSE 2-4 GM/100ML-% IV SOLN
INTRAVENOUS | Status: AC
  Filled 2018-06-26: qty 100

## 2018-06-26 MED ORDER — MIDAZOLAM HCL 5 MG/5ML IJ SOLN
INTRAMUSCULAR | Status: AC
  Filled 2018-06-26: qty 5

## 2018-06-26 MED ORDER — HYDROCODONE-ACETAMINOPHEN 5-325 MG PO TABS: 1.0000 | ORAL_TABLET | Freq: Four times a day (QID) | ORAL | 0 refills | Status: DC | PRN

## 2018-06-26 MED ORDER — SCOPOLAMINE 1 MG/3DAYS TD PT72
MEDICATED_PATCH | TRANSDERMAL | Status: AC
  Filled 2018-06-26: qty 1

## 2018-06-26 MED ORDER — ACETAMINOPHEN 500 MG PO TABS
ORAL_TABLET | ORAL | Status: AC
  Administered 2018-06-26: 1000 mg via ORAL
  Filled 2018-06-26: qty 2

## 2018-06-26 MED ORDER — FENTANYL CITRATE (PF) 100 MCG/2ML IJ SOLN
INTRAMUSCULAR | Status: AC
  Filled 2018-06-26: qty 2

## 2018-06-26 MED ORDER — CHLORHEXIDINE GLUCONATE CLOTH 2 % EX PADS
6.0000 | MEDICATED_PAD | Freq: Once | CUTANEOUS | Status: AC
  Administered 2018-06-26: 6 via TOPICAL

## 2018-06-26 MED ORDER — MIDAZOLAM HCL 2 MG/2ML IJ SOLN
INTRAMUSCULAR | Status: AC
  Filled 2018-06-26: qty 2

## 2018-06-26 MED ORDER — LIDOCAINE HCL (CARDIAC) PF 100 MG/5ML IV SOSY
PREFILLED_SYRINGE | INTRAVENOUS | Status: DC | PRN
  Administered 2018-06-26: 100 mg via INTRAVENOUS

## 2018-06-26 MED ORDER — CHLORHEXIDINE GLUCONATE CLOTH 2 % EX PADS: 6.0000 | MEDICATED_PAD | Freq: Once | CUTANEOUS | Status: DC

## 2018-06-26 MED ORDER — LIDOCAINE-EPINEPHRINE 1 %-1:100000 IJ SOLN
INTRAMUSCULAR | Status: DC | PRN
  Administered 2018-06-26: 20 mL

## 2018-06-26 MED ORDER — CELECOXIB 200 MG PO CAPS
ORAL_CAPSULE | ORAL | Status: AC
  Administered 2018-06-26: 200 mg via ORAL
  Filled 2018-06-26: qty 1

## 2018-06-26 MED ORDER — LIDOCAINE-EPINEPHRINE 1 %-1:100000 IJ SOLN
INTRAMUSCULAR | Status: AC
  Filled 2018-06-26: qty 1

## 2018-06-26 MED ORDER — GABAPENTIN 300 MG PO CAPS
300.0000 mg | ORAL_CAPSULE | ORAL | Status: AC
  Administered 2018-06-26: 300 mg via ORAL

## 2018-06-26 MED ORDER — FENTANYL CITRATE (PF) 100 MCG/2ML IJ SOLN
INTRAMUSCULAR | Status: DC | PRN
  Administered 2018-06-26: 50 ug via INTRAVENOUS
  Administered 2018-06-26 (×2): 25 ug via INTRAVENOUS

## 2018-06-26 MED ORDER — PROPOFOL 10 MG/ML IV BOLUS
INTRAVENOUS | Status: DC | PRN
  Administered 2018-06-26: 130 mg via INTRAVENOUS

## 2018-06-26 SURGICAL SUPPLY — 29 items
APPLIER CLIP 9.375 SM OPEN (CLIP)
BINDER BREAST MEDIUM (GAUZE/BANDAGES/DRESSINGS) ×3 IMPLANT
CANISTER SUCT 1200ML W/VALVE (MISCELLANEOUS) ×3 IMPLANT
CHLORAPREP W/TINT 26ML (MISCELLANEOUS) ×3 IMPLANT
CLIP APPLIE 9.375 SM OPEN (CLIP) IMPLANT
DERMABOND ADVANCED (GAUZE/BANDAGES/DRESSINGS) ×2
DERMABOND ADVANCED .7 DNX12 (GAUZE/BANDAGES/DRESSINGS) ×1 IMPLANT
DRAPE CHEST BREAST 77X106 FENE (MISCELLANEOUS) ×3 IMPLANT
ELECT CAUTERY BLADE 6.4 (BLADE) ×3 IMPLANT
ELECT REM PT RETURN 9FT ADLT (ELECTROSURGICAL) ×3
ELECTRODE REM PT RTRN 9FT ADLT (ELECTROSURGICAL) ×1 IMPLANT
GLOVE BIO SURGEON STRL SZ7 (GLOVE) ×3 IMPLANT
GLOVE INDICATOR 7.5 STRL GRN (GLOVE) ×3 IMPLANT
GOWN STRL REUS W/ TWL LRG LVL3 (GOWN DISPOSABLE) ×2 IMPLANT
GOWN STRL REUS W/TWL LRG LVL3 (GOWN DISPOSABLE) ×4
KIT TURNOVER KIT A (KITS) ×3 IMPLANT
MARGIN MAP 10MM (MISCELLANEOUS) ×3 IMPLANT
NEEDLE HYPO 22GX1.5 SAFETY (NEEDLE) ×3 IMPLANT
PACK BASIN MINOR ARMC (MISCELLANEOUS) ×3 IMPLANT
SPONGE LAP 18X18 RF (DISPOSABLE) ×3 IMPLANT
SUT MNCRL 4-0 (SUTURE) ×4
SUT MNCRL 4-0 27XMFL (SUTURE) ×2
SUT SILK 2 0 SH (SUTURE) ×3 IMPLANT
SUT VIC AB 2-0 SH 27 (SUTURE) ×6
SUT VIC AB 2-0 SH 27XBRD (SUTURE) ×3 IMPLANT
SUT VIC AB 3-0 SH 27 (SUTURE) ×4
SUT VIC AB 3-0 SH 27X BRD (SUTURE) ×2 IMPLANT
SUTURE MNCRL 4-0 27XMF (SUTURE) ×2 IMPLANT
WATER STERILE IRR 1000ML POUR (IV SOLUTION) ×3 IMPLANT

## 2018-06-26 NOTE — Anesthesia Preprocedure Evaluation (Signed)
Anesthesia Evaluation  Patient identified by MRN, date of birth, ID band Patient awake    Reviewed: Allergy & Precautions, H&P , NPO status , Patient's Chart, lab work & pertinent test results  History of Anesthesia Complications (+) PONV and history of anesthetic complications  Airway Mallampati: III  TM Distance: <3 FB Neck ROM: limited    Dental  (+) Chipped   Pulmonary neg pulmonary ROS, neg shortness of breath,           Cardiovascular Exercise Tolerance: Good hypertension, (-) angina(-) Past MI and (-) DOE + dysrhythmias      Neuro/Psych  Headaches,  Neuromuscular disease negative psych ROS   GI/Hepatic Neg liver ROS, hiatal hernia, GERD  Medicated and Controlled,  Endo/Other  negative endocrine ROS  Renal/GU      Musculoskeletal  (+) Arthritis ,   Abdominal   Peds  Hematology negative hematology ROS (+)   Anesthesia Other Findings Past Medical History: No date: Arthritis No date: Cancer (HCC) No date: Complication of anesthesia No date: Dysrhythmia 90's: Gastric reflux No date: GERD (gastroesophageal reflux disease) No date: Headache     Comment:  H/O MIGRAINES 2003: Hiatal hernia No date: Hypertension No date: Mitral valve regurgitation No date: PONV (postoperative nausea and vomiting)     Comment:  N/V No date: SVT (supraventricular tachycardia) (HCC)  Past Surgical History: No date: APPENDECTOMY 06/13/2018: BREAST BIOPSY; Left     Comment:  Affirm Bx- coil clip   atypical sclerosing and DCIS 2000: BREAST EXCISIONAL BIOPSY; Right     Comment:  benign 06/26/2018: BREAST LUMPECTOMY; Left     Comment:  atypical sclerosing lesion and DCIS bracketing No date: CHOLECYSTECTOMY No date: OVARIAN CYST SURGERY No date: TONSILLECTOMY     Comment:  age 30 2003: UPPER GI ENDOSCOPY  BMI    Body Mass Index:  22.05 kg/m      Reproductive/Obstetrics negative OB ROS                              Anesthesia Physical Anesthesia Plan  ASA: III  Anesthesia Plan: General LMA   Post-op Pain Management:    Induction: Intravenous  PONV Risk Score and Plan: Ondansetron, Dexamethasone and Midazolam  Airway Management Planned: LMA  Additional Equipment:   Intra-op Plan:   Post-operative Plan: Extubation in OR  Informed Consent: I have reviewed the patients History and Physical, chart, labs and discussed the procedure including the risks, benefits and alternatives for the proposed anesthesia with the patient or authorized representative who has indicated his/her understanding and acceptance.   Dental Advisory Given  Plan Discussed with: Anesthesiologist, CRNA and Surgeon  Anesthesia Plan Comments: (Patient consented for risks of anesthesia including but not limited to:  - adverse reactions to medications - damage to teeth, lips or other oral mucosa - sore throat or hoarseness - Damage to heart, brain, lungs or loss of life  Patient voiced understanding.)        Anesthesia Quick Evaluation

## 2018-06-26 NOTE — Op Note (Signed)
  Pre-operative Diagnosis: Left DCIS  Post-operative Diagnosis: Same   Surgeon: Caroleen Hamman, MD FACS  Anesthesia: GETA  Procedure: Left Partial mastectomy with margins, needle directed and Ultrasound guided.   Findings: Both wires within excised specimen  Estimated Blood Loss: Minimal         Drains: None         Specimens: partial mastectomy with labels, long lateral and short superior; sentinel node for frozen section       Complications: none                 Condition: Stable    Procedure Details  The patient was seen again in the Holding Room. The benefits, complications, treatment options, and expected outcomes were discussed with the patient. The risks of bleeding, infection, recurrence of symptoms, failure to resolve symptoms, hematoma, seroma, open wound, cosmetic deformity, and the need for further surgery were discussed.  The patient was taken to Operating Room, identified as Susan [name] and the procedure verified.  A Time Out was held and the above information confirmed.  Prior to the induction of general anesthesia, antibiotic prophylaxis was administered. VTE prophylaxis was in place. Appropriate anesthesia was then administered and tolerated well. The chest was prepped with Chloraprep and draped in the sterile fashion. The patient was positioned in the supine position.   Attention was turned to the needle localization site  And the wire was followed to the insertion site using ultrasound probed. One we located the insertion point we created a periareolar incision on medical aspect of the left breast.   Random cutaneous flaps were develop and Dissection around the needle to perform a partial mastectomy with adequate margins was performed ( specimen was oriented and sent for x ray and images were reviewed showing clip and two wires as well as calcification within the specimen). This was done with electrocautery and sharp dissection. Hemostasis was with  electrocautery.   Once assuring that hemostasis was adequate and checked multiple times the wound was closed with interrupted 2-0 Vicryl followed by 4-0 subcuticular Monocryl sutures. Dermabond was placed.  Patient was taken to the recovery room in stable condition .  Caroleen Hamman, MD, FACS

## 2018-06-26 NOTE — Discharge Instructions (Addendum)
AMBULATORY SURGERY  DISCHARGE INSTRUCTIONS   1) The drugs that you were given will stay in your system until tomorrow so for the next 24 hours you should not:  A) Drive an automobile B) Make any legal decisions C) Drink any alcoholic beverage   2) You may resume regular meals tomorrow.  Today it is better to start with liquids and gradually work up to solid foods.  You may eat anything you prefer, but it is better to start with liquids, then soup and crackers, and gradually work up to solid foods.   3) Please notify your doctor immediately if you have any unusual bleeding, trouble breathing, redness and pain at the surgery site, drainage, fever, or pain not relieved by medication. 4)    Please call to schedule your post-operative visit.  5) Additional Instructions:  Lumpectomy A lumpectomy, sometimes called a partial mastectomy, is surgery to remove a cancerous tumor or mass (the lump) from a breast. It is a form of "breast conserving" or "breast preservation" surgery. This means that the cancerous tissue is removed but the breast remains intact. During a lumpectomy, the portion of the breast that contains the tumor is removed. Some normal tissue around the lump may be taken out to make sure that all of the tumor has been removed. Lymph nodes under your arm may also be removed and tested to find out if the cancer has spread. Tell a health care provider about:  Any allergies you have.  All medicines you are taking, including vitamins, herbs, eye drops, creams, and over-the-counter medicines.  Any problems you or family members have had with anesthetic medicines.  Any blood disorders you have.  Any surgeries you have had.  Any medical conditions you have.  Whether you are pregnant or may be pregnant. What are the risks? Generally, this is a safe procedure. However, problems may occur, including:  Bleeding.  Infection.  Pain, swelling, weakness, or numbness in the arm on  the side of your surgery.  Temporary swelling.  Change in the shape of the breast, particularly if a large portion is removed.  Scar tissue that forms at the surgical site and feels hard to the touch.  What happens before the procedure? Staying hydrated Follow instructions from your health care provider about hydration, which may include:  Up to 2 hours before the procedure - you may continue to drink clear liquids, such as water, clear fruit juice, black coffee, and plain tea.  Eating and drinking restrictions  Follow instructions from your health care provider about eating and drinking, which may include: ? 8 hours before the procedure - stop eating heavy meals or foods such as meat, fried foods, or fatty foods. ? 6 hours before the procedure - stop eating light meals or foods, such as toast or cereal. ? 6 hours before the procedure - stop drinking milk or drinks that contain milk. ? 2 hours before the procedure - stop drinking clear liquids. Medicines  Ask your health care provider about: ? Changing or stopping your regular medicines. This is especially important if you are taking diabetes medicines or blood thinners. ? Taking medicines such as aspirin and ibuprofen. These medicines can thin your blood. Do not take these medicines before your procedure if your health care provider instructs you not to.  You may be given antibiotic medicine to help prevent infection. General instructions  Ask your health care provider how your surgical site will be marked or identified.  You may be screened  for extra fluid around the lymph nodes (lymphedema).  Plan to have someone take you home from the hospital or clinic.  On the day of surgery, your health care provider will use a mammogram or ultrasound to locate and mark the tumor in your breast. These markings on your breast will show where the incision will be made. What happens during the procedure?  To lower your risk of  infection: ? Your health care team will wash or sanitize their hands. ? Your skin will be washed with soap.  An IV tube will be put into one of your veins.  You will be given one or more of the following: ? A medicine to help you relax (sedative). ? A medicine to numb the area (local anesthetic). ? A medicine to make you fall asleep (general anesthetic).  Your health care provider will use a kind of electric scalpel that uses heat to minimize bleeding (electrocautery knife). A curved incision that follows the natural curve of your breast will be made. This type of incision will allow for minimal scarring and better healing.  The tumor will be removed along with some of the surrounding tissue. This will be sent to the lab for testing. Your health care provider may also remove lymph nodes at this time if needed.  A small drain tube may be inserted into your breast area or armpit to collect fluid that may build up after surgery. This tube will be connected to a suction bulb on the outside of your body to remove the fluid.  The incision will be closed with stitches (sutures).  A bandage (dressing) may be placed over the incision. The procedure may vary among health care providers and hospitals. What happens after the procedure?  Your blood pressure, heart rate, breathing rate, and blood oxygen level will be monitored until the medicines you were given have worn off.  You will be given medicine for pain.  You may have a drain tube in place for 2-3 days to prevent a collection of blood (hematoma) from developing in the breast. You will be given instructions about caring for the drain before you go home.  A pressure bandage may be applied for 1-2 days to prevent bleeding or swelling. Your pressure bandage may look like a thick piece of fabric or an elastic wrap. Ask your health care provider how to care for your bandage at home.  You may be given a tight sleeve to wear over your arm on the  side of your surgery. You should wear this sleeve as told by your health care provider.  Do not drive for 24 hours if you were given a sedative. This information is not intended to replace advice given to you by your health care provider. Make sure you discuss any questions you have with your health care provider. Document Released: 01/23/2007 Document Revised: 08/25/2016 Document Reviewed: 08/24/2016 Elsevier Interactive Patient Education  2018 [employer].

## 2018-06-26 NOTE — Transfer of Care (Signed)
Immediate Anesthesia Transfer of Care Note  Patient: Susan [name]  Procedure(s) Performed: BREAST LUMPECTOMY WITH NEEDLE LOCALIZATION (Left )  Patient Location: PACU  Anesthesia Type:General  Level of Consciousness: awake, alert  and oriented  Airway & Oxygen Therapy: Patient Spontanous Breathing and Patient connected to face mask oxygen  Post-op Assessment: Report given to RN and Post -op Vital signs reviewed and stable  Post vital signs: Reviewed and stable  Last Vitals:  Vitals Value Taken Time  BP 127/65 06/26/2018 11:28 AM  Temp    Pulse 58 06/26/2018 11:29 AM  Resp 16 06/26/2018 11:29 AM  SpO2 100 % 06/26/2018 11:29 AM  Vitals shown include unvalidated device data.  Last Pain:  Vitals:   06/26/18 0857  TempSrc: Oral  PainSc: 0-No pain         Complications: No apparent anesthesia complications

## 2018-06-26 NOTE — Interval H&P Note (Signed)
History and Physical Interval Note:  06/26/2018 9:47 AM  Susan [name]  has presented today for surgery, with the diagnosis of DUCTAL CARCINOMA DCIS  The various methods of treatment have been discussed with the patient and family. After consideration of risks, benefits and other options for treatment, the patient has consented to  Procedure(s): BREAST LUMPECTOMY WITH NEEDLE LOCALIZATION (Left) as a surgical intervention .  The patient's history has been reviewed, patient examined, no change in status, stable for surgery.  I have reviewed the patient's chart and labs.  Questions were answered to the patient's satisfaction.   Case discussed at tumor board and there are additional calcification seen and they were targeted for excision as well. D/W the pt in detail   [employer]

## 2018-06-26 NOTE — Anesthesia Procedure Notes (Signed)
Procedure Name: LMA Insertion Date/Time: 06/26/2018 10:00 AM Performed by: Philbert Riser, CRNA Pre-anesthesia Checklist: Patient identified, Emergency Drugs available, Suction available, Patient being monitored and Timeout performed Patient Re-evaluated:Patient Re-evaluated prior to induction Oxygen Delivery Method: Circle system utilized and Simple face mask Preoxygenation: Pre-oxygenation with 100% oxygen Induction Type: IV induction LMA: LMA inserted LMA Size: 4.0 Dental Injury: Teeth and Oropharynx as per pre-operative assessment

## 2018-06-26 NOTE — Anesthesia Postprocedure Evaluation (Signed)
Anesthesia Post Note  Patient: Susan [name]  Procedure(s) Performed: BREAST LUMPECTOMY WITH NEEDLE LOCALIZATION (Left )  Patient location during evaluation: PACU Anesthesia Type: General Level of consciousness: awake and alert Pain management: pain level controlled Vital Signs Assessment: post-procedure vital signs reviewed and stable Respiratory status: spontaneous breathing, nonlabored ventilation, respiratory function stable and patient connected to nasal cannula oxygen Cardiovascular status: blood pressure returned to baseline and stable Postop Assessment: no apparent nausea or vomiting Anesthetic complications: no     Last Vitals:  Vitals:   06/26/18 1158 06/26/18 1211  BP: 128/71 (!) 152/66  Pulse: (!) 55 (!) 51  Resp: 17 16  Temp: (!) 36.3 C (!) 36.2 C  SpO2: 94% 99%    Last Pain:  Vitals:   06/26/18 1211  TempSrc: Oral  PainSc: 0-No pain                 Precious Haws [name]

## 2018-06-26 NOTE — Anesthesia Post-op Follow-up Note (Signed)
Anesthesia QCDR form completed.        

## 2018-06-27 ENCOUNTER — Encounter: Payer: Self-pay | Admitting: Surgery

## 2018-06-29 LAB — SURGICAL PATHOLOGY

## 2018-07-02 ENCOUNTER — Ambulatory Visit: Payer: Self-pay | Admitting: Surgery

## 2018-07-03 ENCOUNTER — Encounter: Payer: Self-pay | Admitting: *Deleted

## 2018-07-03 NOTE — Progress Notes (Signed)
  Oncology Nurse Navigator Documentation  Navigator Location: [facility] (07/03/18 1000) Referral date to RadOnc/MedOnc: 07/03/18 (07/03/18 1000) )Navigator Encounter Type: Telephone (07/03/18 1000) Telephone: [place] Call (07/03/18 1000)                                                  Time Spent with Patient: 15 (07/03/18 1000)   Patient is status post surgery on 06/26/18.  Follow up post surgery phone call. Left patient a message to return my call.

## 2018-07-03 NOTE — Progress Notes (Signed)
Patient returned my call.  States she is doing well post surgery.  States she has an appointment with Dr. Baruch Gouty for consultation for radiation therapy.  She is to call if she has any questions or needs.

## 2018-07-04 ENCOUNTER — Encounter: Payer: Self-pay | Admitting: Surgery

## 2018-07-04 ENCOUNTER — Ambulatory Visit (INDEPENDENT_AMBULATORY_CARE_PROVIDER_SITE_OTHER): Payer: PPO | Admitting: Surgery

## 2018-07-04 VITALS — BP 143/77 | HR 61 | Temp 97.7°F | Ht 68.0 in | Wt 145.0 lb

## 2018-07-04 DIAGNOSIS — Z09 Encounter for follow-up examination after completed treatment for conditions other than malignant neoplasm: Secondary | ICD-10-CM

## 2018-07-04 NOTE — Patient Instructions (Signed)
Please be sure to follow up with Radiation Oncology and Medical Oncology.  Please call our office if you have questions or concerns.

## 2018-07-04 NOTE — Progress Notes (Signed)
S/p Left lumpectomy for DCIS Path D/W the pt in detail Margins are clear but 1.7 mm on closes one ( superior) There was no evidence of invasive cancer but  There was comedo necrosis, DCIS I size 32mm ER + PR Neg  PE NAD Wound healing well, no infection or seroma, some retraction of nipple expected given the location of the tumor.  A/P discussion with the patient regarding the close margins of 1.7 mm.  Current guidelines suggested 2 mm is adequate.  I had a discussion with radiation oncology and with the patient in detail.  Offered patient reexcision versus radiation boost.  Patient is not interested in resection at this time and rather wishes to have a boost radiation to the area. All the questions were answered. We will have a follow-up next week with radiation oncology.

## 2018-07-05 ENCOUNTER — Other Ambulatory Visit: Payer: Self-pay

## 2018-07-11 ENCOUNTER — Other Ambulatory Visit: Payer: Self-pay

## 2018-07-11 ENCOUNTER — Ambulatory Visit
Admission: RE | Admit: 2018-07-11 | Discharge: 2018-07-11 | Disposition: A | Discharge status: Home / Self Care | Payer: PPO | Source: Ambulatory Visit | Attending: Radiation Oncology | Admitting: Radiation Oncology

## 2018-07-11 ENCOUNTER — Encounter: Payer: Self-pay | Admitting: Radiation Oncology

## 2018-07-11 VITALS — BP 142/80 | HR 55 | Temp 97.4°F | Resp 18 | Ht 67.32 in | Wt 143.7 lb

## 2018-07-11 DIAGNOSIS — Z170 Estrogen receptor positive status [ER+]: Secondary | ICD-10-CM | POA: Diagnosis not present

## 2018-07-11 DIAGNOSIS — K219 Gastro-esophageal reflux disease without esophagitis: Secondary | ICD-10-CM | POA: Diagnosis not present

## 2018-07-11 DIAGNOSIS — K449 Diaphragmatic hernia without obstruction or gangrene: Secondary | ICD-10-CM | POA: Insufficient documentation

## 2018-07-11 DIAGNOSIS — Z803 Family history of malignant neoplasm of breast: Secondary | ICD-10-CM | POA: Insufficient documentation

## 2018-07-11 DIAGNOSIS — D0512 Intraductal carcinoma in situ of left breast: Secondary | ICD-10-CM | POA: Diagnosis not present

## 2018-07-11 DIAGNOSIS — I471 Supraventricular tachycardia: Secondary | ICD-10-CM | POA: Insufficient documentation

## 2018-07-11 DIAGNOSIS — M129 Arthropathy, unspecified: Secondary | ICD-10-CM | POA: Diagnosis not present

## 2018-07-11 DIAGNOSIS — Z79899 Other long term (current) drug therapy: Secondary | ICD-10-CM | POA: Diagnosis not present

## 2018-07-11 NOTE — Progress Notes (Signed)
Here for new consult

## 2018-07-11 NOTE — Consult Note (Signed)
NEW PATIENT EVALUATION  Name: Susan [name]  MRN: [id number]  Date:   07/11/2018     DOB: January 17, 1951   This 67 y.o. 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  REFERRING PHYSICIAN: Lucille Passy, MD  CHIEF COMPLAINT:  Chief Complaint  Patient presents with  . New Patient (Initial Visit)    DIAGNOSIS: The encounter diagnosis was Ductal carcinoma in situ of left breast.   PREVIOUS INVESTIGATIONS:  Mammogram and ultrasound reviewed Pathology report reviewed Clinical notes reviewed  HPI: patient is a 67 year old female ho presented with an abnormal mammogram of the left breast showing indeterminate microcalcifications over in the midportion of the left breast. Stereotactic core biopsy was performed showing ductal carcinoma in situ.she went on to have a wide local excision showing a variety of pathology including sclerosing adenosis cystic papillary apocrine metaplasia as well as ductal carcinoma in situ.area of DCIS was at least 2.8 cm overall grade 2-3 with comedonecrosis present. Margins were clear but close at 1.7 mm. No lymph nodes were cemented for examination.tumor was ER positive PR negative. She has done well has re: see medical oncology who is recommending antiestrogen therapy after completion of radiation. She is healing well. She is 2 weeks out from surgery and specifically denies breast tenderness cough or bone pain.  PLANNED TREATMENT REGIMEN: hypofractionated whole breast radiation to her left breast  PAST MEDICAL HISTORY:  has a past medical history of Arthritis, Cancer ([place]), Complication of anesthesia, Dysrhythmia, Gastric reflux (90's), GERD (gastroesophageal reflux disease), Headache, Hiatal hernia (2003), Hypertension, Mitral valve regurgitation, PONV (postoperative nausea and vomiting), and SVT (supraventricular tachycardia) ([place]).    PAST  SURGICAL HISTORY:  Past Surgical History:  Procedure Laterality Date  . APPENDECTOMY    . BREAST BIOPSY Left 06/13/2018   Affirm Bx- coil clip   atypical sclerosing and DCIS  . BREAST EXCISIONAL BIOPSY Right 2000   benign  . BREAST LUMPECTOMY Left 06/26/2018   atypical sclerosing lesion and DCIS bracketing  . BREAST LUMPECTOMY WITH NEEDLE LOCALIZATION Left 06/26/2018   Procedure: BREAST LUMPECTOMY WITH NEEDLE LOCALIZATION;  Surgeon: Jules Husbands, MD;  Location: [facility] ORS;  Service: General;  Laterality: Left;  . CHOLECYSTECTOMY    . OVARIAN CYST SURGERY    . TONSILLECTOMY     age 80  . UPPER GI ENDOSCOPY  2003    FAMILY HISTORY: family history includes Breast cancer (age of onset: 60) in her cousin; Breast cancer (age of onset: 4) in her cousin; Breast cancer (age of onset: 45) in her cousin; COPD in her mother; Healthy in her brother; Heart failure in her mother; Hyperlipidemia in her mother; Hypertension in her brother, father, mother, and sister; Stroke in her father.  SOCIAL HISTORY:  reports that she has never smoked. She has never used smokeless tobacco. She reports that she drinks about 1.2 oz of alcohol per week. She reports that she does not use drugs.  ALLERGIES: Patient has no known allergies.  MEDICATIONS:  Current Outpatient Medications  Medication Sig Dispense Refill  . calcium citrate-vitamin D 500-500 MG-UNIT chewable tablet Chew 1 tablet by mouth daily.    . cholecalciferol (VITAMIN D) 1000 units tablet Take 1,000 Units by mouth daily.    . Lactobacillus (PROBIOTIC ACIDOPHILUS PO) Take 1 capsule by mouth daily.     . Magnesium 250 MG TABS Take 250 mg by mouth  every morning.     . metoprolol succinate (TOPROL XL) 25 MG 24 hr tablet Take 0.5 tablets (12.5 mg total) by mouth daily. (Patient taking differently: Take 12.5 mg by mouth every morning. ) 30 tablet 3  . psyllium (TGT PSYLLIUM FIBER) 0.52 g capsule Take 0.52 g by mouth 3 (three) times daily.     Marland Kitchen ALPRAZolam  (XANAX) 0.5 MG tablet Take 1 tablet (0.5 mg total) by mouth at bedtime as needed. for sleep (Patient not taking: Reported on 07/11/2018) 30 tablet 1  . calcium carbonate (TUMS EX) 750 MG chewable tablet Chew 750 mg by mouth as needed for heartburn.     . fluticasone (FLONASE) 50 MCG/ACT nasal spray Place 2 sprays into both nostrils as needed.     . naproxen sodium (ANAPROX) 220 MG tablet Take 220 mg by mouth daily as needed (pain).     . ranitidine (ZANTAC) 150 MG tablet Take 150 mg by mouth daily as needed for heartburn.    Marland Kitchen SIMETHICONE-80 PO Take 80 mg by mouth as needed (gas).      No current facility-administered medications for this encounter.     ECOG PERFORMANCE STATUS:  0 - Asymptomatic  REVIEW OF SYSTEMS:  Patient denies any weight loss, fatigue, weakness, fever, chills or night sweats. Patient denies any loss of vision, blurred vision. Patient denies any ringing  of the ears or hearing loss. No irregular heartbeat. Patient denies heart murmur or history of fainting. Patient denies any chest pain or pain radiating to her upper extremities. Patient denies any shortness of breath, difficulty breathing at night, cough or hemoptysis. Patient denies any swelling in the lower legs. Patient denies any nausea vomiting, vomiting of blood, or coffee ground material in the vomitus. Patient denies any stomach pain. Patient states has had normal bowel movements no significant constipation or diarrhea. Patient denies any dysuria, hematuria or significant nocturia. Patient denies any problems walking, swelling in the joints or loss of balance. Patient denies any skin changes, loss of hair or loss of weight. Patient denies any excessive worrying or anxiety or significant depression. Patient denies any problems with insomnia. Patient denies excessive thirst, polyuria, polydipsia. Patient denies any swollen glands, patient denies easy bruising or easy bleeding. Patient denies any recent infections, allergies or  URI. Patient "s visual fields have not changed significantly in recent time.    PHYSICAL EXAM: BP (!) 142/80 (BP Location: Right Arm, Patient Position: Sitting, Cuff Size: Normal)   Pulse (!) 55   Temp (!) 97.4 F (36.3 C) (Tympanic)   Resp 18   Ht 5' 7.32" (1.71 m)   Wt 143 lb 11.8 oz (65.2 kg)   BMI 22.30 kg/m  Patient is a wide local excision scar with some crusting around around the nipple areolar complex of the left breast. No dominant mass or nodularity is noted in either breast in 2 positions examined. No axillary or supraclavicular adenopathy is appreciated. Well-developed well-nourished patient in NAD. HEENT reveals PERLA, EOMI, discs not visualized.  Oral cavity is clear. No oral mucosal lesions are identified. Neck is clear without evidence of cervical or supraclavicular adenopathy. Lungs are clear to A&P. Cardiac examination is essentially unremarkable with regular rate and rhythm without murmur rub or thrill. Abdomen is benign with no organomegaly or masses noted. Motor sensory and DTR levels are equal and symmetric in the upper and lower extremities. Cranial nerves II through XII are grossly intact. Proprioception is intact. No peripheral adenopathy or edema is identified. No  motor or sensory levels are noted. Crude visual fields are within normal range.  LABORATORY DATA: pathology reports reviewed    RADIOLOGY RESULTS:mammogram and ultrasound reviewed   IMPRESSION: ductal carcinoma in situ stage 0 of the left breast status post wide local excision in 67 year old female ER positive  PLAN: this time of recommended whole breast radiation therapy. She would be a good candidate for hypofractionated course of treatment over 4 weeks. Would also boost her scar another 1600 cGy using electron beam based on the close margin. Risks and benefits of treatment including skin reaction fatigue alteration of blood counts possible inclusion of superficial lung all were described in detail to the  patient. She seems to comprehend my treatment plan well. I've personally set up and ordered CT simulation for next week to allow for some more healing. Patient also will be candidate for antiestrogen therapy after completion of radiation.patient and husband both seem to comprehend my treatment plan well.  I would like to take this opportunity to thank you for allowing me to participate in the care of your patient.Noreene Filbert, MD

## 2018-07-19 ENCOUNTER — Other Ambulatory Visit: Payer: Self-pay | Admitting: *Deleted

## 2018-07-19 ENCOUNTER — Ambulatory Visit
Admission: RE | Admit: 2018-07-19 | Discharge: 2018-07-19 | Disposition: A | Discharge status: Home / Self Care | Payer: PPO | Source: Ambulatory Visit | Attending: Radiation Oncology | Admitting: Radiation Oncology

## 2018-07-19 DIAGNOSIS — Z170 Estrogen receptor positive status [ER+]: Secondary | ICD-10-CM | POA: Diagnosis not present

## 2018-07-19 DIAGNOSIS — D0512 Intraductal carcinoma in situ of left breast: Secondary | ICD-10-CM | POA: Diagnosis not present

## 2018-07-19 DIAGNOSIS — Z510 Encounter for antineoplastic radiation therapy: Secondary | ICD-10-CM | POA: Insufficient documentation

## 2018-07-23 DIAGNOSIS — Z170 Estrogen receptor positive status [ER+]: Secondary | ICD-10-CM | POA: Diagnosis not present

## 2018-07-23 DIAGNOSIS — D0512 Intraductal carcinoma in situ of left breast: Secondary | ICD-10-CM | POA: Diagnosis not present

## 2018-07-23 DIAGNOSIS — Z510 Encounter for antineoplastic radiation therapy: Secondary | ICD-10-CM | POA: Diagnosis not present

## 2018-07-26 ENCOUNTER — Ambulatory Visit
Admission: RE | Admit: 2018-07-26 | Discharge: 2018-07-26 | Disposition: A | Discharge status: Home / Self Care | Payer: PPO | Source: Ambulatory Visit | Attending: Urgent Care | Admitting: Urgent Care

## 2018-07-26 ENCOUNTER — Ambulatory Visit
Admission: RE | Admit: 2018-07-26 | Discharge: 2018-07-26 | Disposition: A | Discharge status: Home / Self Care | Payer: PPO | Source: Ambulatory Visit | Attending: Radiation Oncology | Admitting: Radiation Oncology

## 2018-07-26 DIAGNOSIS — D0512 Intraductal carcinoma in situ of left breast: Secondary | ICD-10-CM | POA: Insufficient documentation

## 2018-07-26 DIAGNOSIS — M85852 Other specified disorders of bone density and structure, left thigh: Secondary | ICD-10-CM | POA: Diagnosis not present

## 2018-07-26 DIAGNOSIS — Z780 Asymptomatic menopausal state: Secondary | ICD-10-CM | POA: Diagnosis not present

## 2018-07-26 DIAGNOSIS — Z170 Estrogen receptor positive status [ER+]: Secondary | ICD-10-CM | POA: Diagnosis not present

## 2018-07-26 DIAGNOSIS — M85832 Other specified disorders of bone density and structure, left forearm: Secondary | ICD-10-CM | POA: Diagnosis not present

## 2018-07-26 DIAGNOSIS — Z510 Encounter for antineoplastic radiation therapy: Secondary | ICD-10-CM | POA: Insufficient documentation

## 2018-07-26 DIAGNOSIS — M8589 Other specified disorders of bone density and structure, multiple sites: Secondary | ICD-10-CM | POA: Diagnosis not present

## 2018-07-30 ENCOUNTER — Ambulatory Visit
Admission: RE | Admit: 2018-07-30 | Discharge: 2018-07-30 | Disposition: A | Discharge status: Home / Self Care | Payer: PPO | Source: Ambulatory Visit | Attending: Radiation Oncology | Admitting: Radiation Oncology

## 2018-07-30 DIAGNOSIS — Z510 Encounter for antineoplastic radiation therapy: Secondary | ICD-10-CM | POA: Diagnosis not present

## 2018-07-30 DIAGNOSIS — D0512 Intraductal carcinoma in situ of left breast: Secondary | ICD-10-CM | POA: Diagnosis not present

## 2018-07-30 DIAGNOSIS — Z170 Estrogen receptor positive status [ER+]: Secondary | ICD-10-CM | POA: Diagnosis not present

## 2018-07-31 ENCOUNTER — Ambulatory Visit
Admission: RE | Admit: 2018-07-31 | Discharge: 2018-07-31 | Disposition: A | Discharge status: Home / Self Care | Payer: PPO | Source: Ambulatory Visit | Attending: Radiation Oncology | Admitting: Radiation Oncology

## 2018-07-31 DIAGNOSIS — D0512 Intraductal carcinoma in situ of left breast: Secondary | ICD-10-CM | POA: Diagnosis not present

## 2018-07-31 DIAGNOSIS — Z510 Encounter for antineoplastic radiation therapy: Secondary | ICD-10-CM | POA: Diagnosis not present

## 2018-07-31 DIAGNOSIS — Z170 Estrogen receptor positive status [ER+]: Secondary | ICD-10-CM | POA: Diagnosis not present

## 2018-08-01 ENCOUNTER — Ambulatory Visit
Admission: RE | Admit: 2018-08-01 | Discharge: 2018-08-01 | Disposition: A | Discharge status: Home / Self Care | Payer: PPO | Source: Ambulatory Visit | Attending: Radiation Oncology | Admitting: Radiation Oncology

## 2018-08-01 DIAGNOSIS — D0512 Intraductal carcinoma in situ of left breast: Secondary | ICD-10-CM | POA: Diagnosis not present

## 2018-08-01 DIAGNOSIS — Z170 Estrogen receptor positive status [ER+]: Secondary | ICD-10-CM | POA: Diagnosis not present

## 2018-08-01 DIAGNOSIS — Z510 Encounter for antineoplastic radiation therapy: Secondary | ICD-10-CM | POA: Diagnosis not present

## 2018-08-02 ENCOUNTER — Ambulatory Visit
Admission: RE | Admit: 2018-08-02 | Discharge: 2018-08-02 | Disposition: A | Discharge status: Home / Self Care | Payer: PPO | Source: Ambulatory Visit | Attending: Radiation Oncology | Admitting: Radiation Oncology

## 2018-08-02 DIAGNOSIS — Z170 Estrogen receptor positive status [ER+]: Secondary | ICD-10-CM | POA: Diagnosis not present

## 2018-08-02 DIAGNOSIS — Z510 Encounter for antineoplastic radiation therapy: Secondary | ICD-10-CM | POA: Diagnosis not present

## 2018-08-02 DIAGNOSIS — D0512 Intraductal carcinoma in situ of left breast: Secondary | ICD-10-CM | POA: Diagnosis not present

## 2018-08-03 ENCOUNTER — Ambulatory Visit
Admission: RE | Admit: 2018-08-03 | Discharge: 2018-08-03 | Disposition: A | Discharge status: Home / Self Care | Payer: PPO | Source: Ambulatory Visit | Attending: Radiation Oncology | Admitting: Radiation Oncology

## 2018-08-03 DIAGNOSIS — D0512 Intraductal carcinoma in situ of left breast: Secondary | ICD-10-CM | POA: Diagnosis not present

## 2018-08-03 DIAGNOSIS — Z510 Encounter for antineoplastic radiation therapy: Secondary | ICD-10-CM | POA: Diagnosis not present

## 2018-08-03 DIAGNOSIS — Z170 Estrogen receptor positive status [ER+]: Secondary | ICD-10-CM | POA: Diagnosis not present

## 2018-08-06 ENCOUNTER — Ambulatory Visit
Admission: RE | Admit: 2018-08-06 | Discharge: 2018-08-06 | Disposition: A | Discharge status: Home / Self Care | Payer: PPO | Source: Ambulatory Visit | Attending: Radiation Oncology | Admitting: Radiation Oncology

## 2018-08-06 DIAGNOSIS — Z170 Estrogen receptor positive status [ER+]: Secondary | ICD-10-CM | POA: Diagnosis not present

## 2018-08-06 DIAGNOSIS — D0512 Intraductal carcinoma in situ of left breast: Secondary | ICD-10-CM | POA: Diagnosis not present

## 2018-08-06 DIAGNOSIS — Z510 Encounter for antineoplastic radiation therapy: Secondary | ICD-10-CM | POA: Diagnosis not present

## 2018-08-07 ENCOUNTER — Ambulatory Visit
Admission: RE | Admit: 2018-08-07 | Discharge: 2018-08-07 | Disposition: A | Discharge status: Home / Self Care | Payer: PPO | Source: Ambulatory Visit | Attending: Radiation Oncology | Admitting: Radiation Oncology

## 2018-08-07 DIAGNOSIS — Z510 Encounter for antineoplastic radiation therapy: Secondary | ICD-10-CM | POA: Diagnosis not present

## 2018-08-07 DIAGNOSIS — D0512 Intraductal carcinoma in situ of left breast: Secondary | ICD-10-CM | POA: Diagnosis not present

## 2018-08-07 DIAGNOSIS — Z170 Estrogen receptor positive status [ER+]: Secondary | ICD-10-CM | POA: Diagnosis not present

## 2018-08-08 ENCOUNTER — Ambulatory Visit
Admission: RE | Admit: 2018-08-08 | Discharge: 2018-08-08 | Disposition: A | Discharge status: Home / Self Care | Payer: PPO | Source: Ambulatory Visit | Attending: Radiation Oncology | Admitting: Radiation Oncology

## 2018-08-08 DIAGNOSIS — Z170 Estrogen receptor positive status [ER+]: Secondary | ICD-10-CM | POA: Diagnosis not present

## 2018-08-08 DIAGNOSIS — D0512 Intraductal carcinoma in situ of left breast: Secondary | ICD-10-CM | POA: Diagnosis not present

## 2018-08-08 DIAGNOSIS — Z510 Encounter for antineoplastic radiation therapy: Secondary | ICD-10-CM | POA: Diagnosis not present

## 2018-08-09 ENCOUNTER — Ambulatory Visit
Admission: RE | Admit: 2018-08-09 | Discharge: 2018-08-09 | Disposition: A | Discharge status: Home / Self Care | Payer: PPO | Source: Ambulatory Visit | Attending: Radiation Oncology | Admitting: Radiation Oncology

## 2018-08-09 ENCOUNTER — Inpatient Hospital Stay: Payer: PPO | Attending: Radiation Oncology

## 2018-08-09 ENCOUNTER — Other Ambulatory Visit: Payer: Self-pay

## 2018-08-09 DIAGNOSIS — Z510 Encounter for antineoplastic radiation therapy: Secondary | ICD-10-CM | POA: Diagnosis not present

## 2018-08-09 DIAGNOSIS — D0512 Intraductal carcinoma in situ of left breast: Secondary | ICD-10-CM

## 2018-08-09 DIAGNOSIS — Z170 Estrogen receptor positive status [ER+]: Secondary | ICD-10-CM | POA: Diagnosis not present

## 2018-08-09 LAB — CBC
HEMATOCRIT: 43.1 % (ref 35.0–47.0)
Hemoglobin: 14.6 g/dL (ref 12.0–16.0)
MCH: 30.2 pg (ref 26.0–34.0)
MCHC: 33.9 g/dL (ref 32.0–36.0)
MCV: 89.2 fL (ref 80.0–100.0)
Platelets: 256 10*3/uL (ref 150–440)
RBC: 4.84 MIL/uL (ref 3.80–5.20)
RDW: 13.9 % (ref 11.5–14.5)
WBC: 4.9 10*3/uL (ref 3.6–11.0)

## 2018-08-10 ENCOUNTER — Ambulatory Visit
Admission: RE | Admit: 2018-08-10 | Discharge: 2018-08-10 | Disposition: A | Discharge status: Home / Self Care | Payer: PPO | Source: Ambulatory Visit | Attending: Radiation Oncology | Admitting: Radiation Oncology

## 2018-08-10 DIAGNOSIS — Z170 Estrogen receptor positive status [ER+]: Secondary | ICD-10-CM | POA: Diagnosis not present

## 2018-08-10 DIAGNOSIS — D0512 Intraductal carcinoma in situ of left breast: Secondary | ICD-10-CM | POA: Diagnosis not present

## 2018-08-10 DIAGNOSIS — Z510 Encounter for antineoplastic radiation therapy: Secondary | ICD-10-CM | POA: Diagnosis not present

## 2018-08-12 ENCOUNTER — Ambulatory Visit: Admission: RE | Admit: 2018-08-12 | Payer: PPO | Source: Ambulatory Visit

## 2018-08-13 ENCOUNTER — Ambulatory Visit: Payer: PPO

## 2018-08-13 ENCOUNTER — Ambulatory Visit
Admission: RE | Admit: 2018-08-13 | Discharge: 2018-08-13 | Disposition: A | Discharge status: Home / Self Care | Payer: PPO | Source: Ambulatory Visit | Attending: Radiation Oncology | Admitting: Radiation Oncology

## 2018-08-13 DIAGNOSIS — Z170 Estrogen receptor positive status [ER+]: Secondary | ICD-10-CM | POA: Diagnosis not present

## 2018-08-13 DIAGNOSIS — Z510 Encounter for antineoplastic radiation therapy: Secondary | ICD-10-CM | POA: Diagnosis not present

## 2018-08-13 DIAGNOSIS — D0512 Intraductal carcinoma in situ of left breast: Secondary | ICD-10-CM | POA: Diagnosis not present

## 2018-08-14 ENCOUNTER — Ambulatory Visit
Admission: RE | Admit: 2018-08-14 | Discharge: 2018-08-14 | Disposition: A | Discharge status: Home / Self Care | Payer: PPO | Source: Ambulatory Visit | Attending: Radiation Oncology | Admitting: Radiation Oncology

## 2018-08-14 DIAGNOSIS — D0512 Intraductal carcinoma in situ of left breast: Secondary | ICD-10-CM | POA: Diagnosis not present

## 2018-08-14 DIAGNOSIS — Z170 Estrogen receptor positive status [ER+]: Secondary | ICD-10-CM | POA: Diagnosis not present

## 2018-08-14 DIAGNOSIS — Z510 Encounter for antineoplastic radiation therapy: Secondary | ICD-10-CM | POA: Diagnosis not present

## 2018-08-15 ENCOUNTER — Ambulatory Visit
Admission: RE | Admit: 2018-08-15 | Discharge: 2018-08-15 | Disposition: A | Discharge status: Home / Self Care | Payer: PPO | Source: Ambulatory Visit | Attending: Radiation Oncology | Admitting: Radiation Oncology

## 2018-08-15 DIAGNOSIS — Z510 Encounter for antineoplastic radiation therapy: Secondary | ICD-10-CM | POA: Diagnosis not present

## 2018-08-15 DIAGNOSIS — D0512 Intraductal carcinoma in situ of left breast: Secondary | ICD-10-CM | POA: Diagnosis not present

## 2018-08-15 DIAGNOSIS — Z170 Estrogen receptor positive status [ER+]: Secondary | ICD-10-CM | POA: Diagnosis not present

## 2018-08-16 ENCOUNTER — Ambulatory Visit
Admission: RE | Admit: 2018-08-16 | Discharge: 2018-08-16 | Disposition: A | Discharge status: Home / Self Care | Payer: PPO | Source: Ambulatory Visit | Attending: Radiation Oncology | Admitting: Radiation Oncology

## 2018-08-16 DIAGNOSIS — Z510 Encounter for antineoplastic radiation therapy: Secondary | ICD-10-CM | POA: Diagnosis not present

## 2018-08-16 DIAGNOSIS — Z170 Estrogen receptor positive status [ER+]: Secondary | ICD-10-CM | POA: Diagnosis not present

## 2018-08-16 DIAGNOSIS — D0512 Intraductal carcinoma in situ of left breast: Secondary | ICD-10-CM | POA: Diagnosis not present

## 2018-08-17 ENCOUNTER — Ambulatory Visit
Admission: RE | Admit: 2018-08-17 | Discharge: 2018-08-17 | Disposition: A | Discharge status: Home / Self Care | Payer: PPO | Source: Ambulatory Visit | Attending: Radiation Oncology | Admitting: Radiation Oncology

## 2018-08-17 DIAGNOSIS — Z170 Estrogen receptor positive status [ER+]: Secondary | ICD-10-CM | POA: Diagnosis not present

## 2018-08-17 DIAGNOSIS — D0512 Intraductal carcinoma in situ of left breast: Secondary | ICD-10-CM | POA: Diagnosis not present

## 2018-08-17 DIAGNOSIS — Z510 Encounter for antineoplastic radiation therapy: Secondary | ICD-10-CM | POA: Diagnosis not present

## 2018-08-20 ENCOUNTER — Ambulatory Visit
Admission: RE | Admit: 2018-08-20 | Discharge: 2018-08-20 | Disposition: A | Discharge status: Home / Self Care | Payer: PPO | Source: Ambulatory Visit | Attending: Radiation Oncology | Admitting: Radiation Oncology

## 2018-08-20 DIAGNOSIS — Z510 Encounter for antineoplastic radiation therapy: Secondary | ICD-10-CM | POA: Diagnosis not present

## 2018-08-20 DIAGNOSIS — D0512 Intraductal carcinoma in situ of left breast: Secondary | ICD-10-CM | POA: Diagnosis not present

## 2018-08-20 DIAGNOSIS — Z170 Estrogen receptor positive status [ER+]: Secondary | ICD-10-CM | POA: Diagnosis not present

## 2018-08-21 ENCOUNTER — Ambulatory Visit
Admission: RE | Admit: 2018-08-21 | Discharge: 2018-08-21 | Disposition: A | Discharge status: Home / Self Care | Payer: PPO | Source: Ambulatory Visit | Attending: Radiation Oncology | Admitting: Radiation Oncology

## 2018-08-21 DIAGNOSIS — Z510 Encounter for antineoplastic radiation therapy: Secondary | ICD-10-CM | POA: Diagnosis not present

## 2018-08-22 ENCOUNTER — Ambulatory Visit
Admission: RE | Admit: 2018-08-22 | Discharge: 2018-08-22 | Disposition: A | Discharge status: Home / Self Care | Payer: PPO | Source: Ambulatory Visit | Attending: Radiation Oncology | Admitting: Radiation Oncology

## 2018-08-22 DIAGNOSIS — Z510 Encounter for antineoplastic radiation therapy: Secondary | ICD-10-CM | POA: Diagnosis not present

## 2018-08-23 ENCOUNTER — Inpatient Hospital Stay: Payer: PPO

## 2018-08-23 ENCOUNTER — Ambulatory Visit
Admission: RE | Admit: 2018-08-23 | Discharge: 2018-08-23 | Disposition: A | Discharge status: Home / Self Care | Payer: PPO | Source: Ambulatory Visit | Attending: Radiation Oncology | Admitting: Radiation Oncology

## 2018-08-23 DIAGNOSIS — Z510 Encounter for antineoplastic radiation therapy: Secondary | ICD-10-CM | POA: Diagnosis not present

## 2018-08-23 DIAGNOSIS — D0512 Intraductal carcinoma in situ of left breast: Secondary | ICD-10-CM | POA: Diagnosis not present

## 2018-08-23 LAB — CBC
HCT: 44 % (ref 35.0–47.0)
Hemoglobin: 15 g/dL (ref 12.0–16.0)
MCH: 30 pg (ref 26.0–34.0)
MCHC: 34 g/dL (ref 32.0–36.0)
MCV: 88.2 fL (ref 80.0–100.0)
PLATELETS: 235 10*3/uL (ref 150–440)
RBC: 4.99 MIL/uL (ref 3.80–5.20)
RDW: 13.9 % (ref 11.5–14.5)
WBC: 5.5 10*3/uL (ref 3.6–11.0)

## 2018-08-24 ENCOUNTER — Ambulatory Visit
Admission: RE | Admit: 2018-08-24 | Discharge: 2018-08-24 | Disposition: A | Discharge status: Home / Self Care | Payer: PPO | Source: Ambulatory Visit | Attending: Radiation Oncology | Admitting: Radiation Oncology

## 2018-08-24 DIAGNOSIS — Z510 Encounter for antineoplastic radiation therapy: Secondary | ICD-10-CM | POA: Diagnosis not present

## 2018-08-24 DIAGNOSIS — D0512 Intraductal carcinoma in situ of left breast: Secondary | ICD-10-CM | POA: Diagnosis not present

## 2018-08-24 DIAGNOSIS — Z170 Estrogen receptor positive status [ER+]: Secondary | ICD-10-CM | POA: Diagnosis not present

## 2018-08-28 ENCOUNTER — Ambulatory Visit
Admission: RE | Admit: 2018-08-28 | Discharge: 2018-08-28 | Disposition: A | Discharge status: Home / Self Care | Payer: PPO | Source: Ambulatory Visit | Attending: Radiation Oncology | Admitting: Radiation Oncology

## 2018-08-28 DIAGNOSIS — D0512 Intraductal carcinoma in situ of left breast: Secondary | ICD-10-CM | POA: Insufficient documentation

## 2018-08-28 DIAGNOSIS — Z170 Estrogen receptor positive status [ER+]: Secondary | ICD-10-CM | POA: Diagnosis not present

## 2018-08-28 DIAGNOSIS — Z510 Encounter for antineoplastic radiation therapy: Secondary | ICD-10-CM | POA: Diagnosis not present

## 2018-08-29 ENCOUNTER — Ambulatory Visit
Admission: RE | Admit: 2018-08-29 | Discharge: 2018-08-29 | Disposition: A | Discharge status: Home / Self Care | Payer: PPO | Source: Ambulatory Visit | Attending: Radiation Oncology | Admitting: Radiation Oncology

## 2018-08-29 DIAGNOSIS — Z510 Encounter for antineoplastic radiation therapy: Secondary | ICD-10-CM | POA: Diagnosis not present

## 2018-08-30 ENCOUNTER — Ambulatory Visit
Admission: RE | Admit: 2018-08-30 | Discharge: 2018-08-30 | Disposition: A | Discharge status: Home / Self Care | Payer: PPO | Source: Ambulatory Visit | Attending: Radiation Oncology | Admitting: Radiation Oncology

## 2018-08-30 DIAGNOSIS — Z510 Encounter for antineoplastic radiation therapy: Secondary | ICD-10-CM | POA: Diagnosis not present

## 2018-08-31 ENCOUNTER — Ambulatory Visit
Admission: RE | Admit: 2018-08-31 | Discharge: 2018-08-31 | Disposition: A | Discharge status: Home / Self Care | Payer: PPO | Source: Ambulatory Visit | Attending: Radiation Oncology | Admitting: Radiation Oncology

## 2018-08-31 DIAGNOSIS — Z170 Estrogen receptor positive status [ER+]: Secondary | ICD-10-CM | POA: Diagnosis not present

## 2018-08-31 DIAGNOSIS — Z510 Encounter for antineoplastic radiation therapy: Secondary | ICD-10-CM | POA: Diagnosis not present

## 2018-08-31 DIAGNOSIS — D0512 Intraductal carcinoma in situ of left breast: Secondary | ICD-10-CM | POA: Diagnosis not present

## 2018-09-05 ENCOUNTER — Encounter: Payer: Self-pay | Admitting: Hematology and Oncology

## 2018-09-05 ENCOUNTER — Inpatient Hospital Stay: Payer: PPO

## 2018-09-05 ENCOUNTER — Inpatient Hospital Stay: Payer: PPO | Attending: Hematology and Oncology | Admitting: Hematology and Oncology

## 2018-09-05 ENCOUNTER — Telehealth: Payer: Self-pay | Admitting: Genetic Counselor

## 2018-09-05 ENCOUNTER — Other Ambulatory Visit: Payer: Self-pay

## 2018-09-05 VITALS — BP 127/78 | HR 65 | Temp 97.4°F | Resp 18 | Wt 143.4 lb

## 2018-09-05 DIAGNOSIS — M85852 Other specified disorders of bone density and structure, left thigh: Secondary | ICD-10-CM | POA: Diagnosis not present

## 2018-09-05 DIAGNOSIS — Z79810 Long term (current) use of selective estrogen receptor modulators (SERMs): Secondary | ICD-10-CM

## 2018-09-05 DIAGNOSIS — D0512 Intraductal carcinoma in situ of left breast: Secondary | ICD-10-CM

## 2018-09-05 DIAGNOSIS — M8580 Other specified disorders of bone density and structure, unspecified site: Secondary | ICD-10-CM | POA: Diagnosis not present

## 2018-09-05 DIAGNOSIS — Z803 Family history of malignant neoplasm of breast: Secondary | ICD-10-CM | POA: Diagnosis not present

## 2018-09-05 DIAGNOSIS — Z923 Personal history of irradiation: Secondary | ICD-10-CM | POA: Diagnosis not present

## 2018-09-05 DIAGNOSIS — C50919 Malignant neoplasm of unspecified site of unspecified female breast: Secondary | ICD-10-CM | POA: Insufficient documentation

## 2018-09-05 LAB — COMPREHENSIVE METABOLIC PANEL
ALBUMIN: 4.5 g/dL (ref 3.5–5.0)
ALK PHOS: 68 U/L (ref 38–126)
ALT: 20 U/L (ref 0–44)
AST: 22 U/L (ref 15–41)
Anion gap: 9 (ref 5–15)
BILIRUBIN TOTAL: 1.3 mg/dL — AB (ref 0.3–1.2)
BUN: 22 mg/dL (ref 8–23)
CALCIUM: 9.4 mg/dL (ref 8.9–10.3)
CO2: 26 mmol/L (ref 22–32)
Chloride: 103 mmol/L (ref 98–111)
Creatinine, Ser: 0.84 mg/dL (ref 0.44–1.00)
GFR calc non Af Amer: >60 mL/min (ref >60)
GLUCOSE: 101 mg/dL — AB (ref 70–99)
Potassium: 5.5 mmol/L — ABNORMAL HIGH (ref 3.5–5.1)
Sodium: 138 mmol/L (ref 135–145)
TOTAL PROTEIN: 7.2 g/dL (ref 6.5–8.1)

## 2018-09-05 LAB — CBC WITH DIFFERENTIAL/PLATELET
BASOS ABS: 0.1 10*3/uL (ref 0–0.1)
BASOS PCT: 1 %
Eosinophils Absolute: 0.1 10*3/uL (ref 0–0.7)
Eosinophils Relative: 3 %
HEMATOCRIT: 47 % (ref 35.0–47.0)
HEMOGLOBIN: 15.5 g/dL (ref 12.0–16.0)
Lymphocytes Relative: 18 %
Lymphs Abs: 0.9 10*3/uL — ABNORMAL LOW (ref 1.0–3.6)
MCH: 29.3 pg (ref 26.0–34.0)
MCHC: 33 g/dL (ref 32.0–36.0)
MCV: 88.8 fL (ref 80.0–100.0)
MONO ABS: 0.6 10*3/uL (ref 0.2–0.9)
Monocytes Relative: 11 %
NEUTROS ABS: 3.5 10*3/uL (ref 1.4–6.5)
Neutrophils Relative %: 67 %
Platelets: 230 10*3/uL (ref 150–440)
RBC: 5.29 MIL/uL — AB (ref 3.80–5.20)
RDW: 14.1 % (ref 11.5–14.5)
WBC: 5.2 10*3/uL (ref 3.6–11.0)

## 2018-09-05 MED ORDER — TAMOXIFEN CITRATE 20 MG PO TABS: 20.0000 mg | ORAL_TABLET | Freq: Every day | ORAL | 0 refills | Status: DC

## 2018-09-05 NOTE — Progress Notes (Signed)
Here for follow up. S/p surg on July 2 nd- stated she is doing well after breast surgery. S/b Dr Perrin Maltese ( surg ) on July 10 .

## 2018-09-05 NOTE — Telephone Encounter (Signed)
           Cancer Genetics            Telegenetics Initial Visit    Patient Name: Susan [name] Patient DOB: 03/26/1951 Patient Age: 67 y.o. Phone Call Date: 09/05/2018  Referring Provider: Melissa Corcoran, MD Bryan Gray, NP  Reason for Visit: Evaluate for hereditary susceptibility to cancer    Assessment and Plan:  . Susan [name]'s personal history of breast cancer at age 67 is not suggestive of a hereditary predisposition to cancer even when taking into account her family history. She has 2 paternal first cousins (related through an unaffected aunt and uncle) who had breast cancer. She also has 2 maternal first cousins (both daughters of an unaffected uncle) who had breast cancer. One of these maternal cousins reportedly has a BRCA1 or BRCA2 mutation, but it has been confirmed to be in the cousin's maternal family who has no blood-relation to Ms. Commins.  . Testing is available to determine whether she has a pathogenic mutation that will impact her screening and risk-reduction for cancer. A negative result will be reassuring.  . Susan [name] wished to pursue genetic testing and had her blood drawn today while in clinic. Analysis will include the 84 genes on [facility]'s Multi-Cancer panel (AIP, ALK, APC, ATM, AXIN2, BAP1, BARD1, BLM, BMPR1A, BRCA1, BRCA2, BRIP1, CASR, CDC73, CDH1, CDK4, CDKN1B, CDKN1C, CDKN2A, CEBPA, CHEK2, CTNNA1, DICER1, DIS3L2, EGFR, EPCAM, FH, FLCN, GATA2, GPC3, GREM1, HOXB13, HRAS, KIT, MAX, MEN1, MET, MITF, MLH1, MSH2, MSH3, MSH6, MUTYH, NBN, NF1, NF2, NTHL1, PALB2, PDGFRA, PHOX2B, PMS2, POLD1, POLE, POT1, PRKAR1A, PTCH1, PTEN, RAD50, RAD51C, RAD51D, RB1, RECQL4, RET, RUNX1, SDHA, SDHAF2, SDHB, SDHC, SDHD, SMAD4, SMARCA4, SMARCB1, SMARCE1, STK11, SUFU, TERC, TERT, TMEM127, TP53, TSC1, TSC2, VHL, WRN, WT1).   . Once the lab receives her specimen, results should be available in approximately 2-3 weeks, at which point we will contact her and address implications  for her as well as address genetic testing for at-risk family members, if needed.      Dr. Finnegan was available for questions concerning this case. Total time spent by counseling by phone was approximately 25 minutes.   _____________________________________________________________________   History of Present Illness: Susan [name], a 67 y.o. female, was referred for genetic counseling to discuss the possibility of a hereditary predisposition to cancer and discuss whether genetic testing is warranted. This was a telegenetics visit via phone.  Susan [name] was diagnosed with breast cancer (DCIS) at age 66. She is s/p lumpectomy and radiation treatments. She will not be taking Tamoxifen.    Past Medical History:  Diagnosis Date  . Arthritis   . Breast cancer ([facility])    Left DCIS  . Cancer ([facility])   . Complication of anesthesia   . Dysrhythmia   . Family history of breast cancer   . Gastric reflux 90's  . GERD (gastroesophageal reflux disease)   . Headache    H/O MIGRAINES  . Hiatal hernia 2003  . Hypertension   . Mitral valve regurgitation   . PONV (postoperative nausea and vomiting)    N/V  . SVT (supraventricular tachycardia) ([facility])     Past Surgical History:  Procedure Laterality Date  . APPENDECTOMY    . BREAST BIOPSY Left 06/13/2018   Affirm Bx- coil clip   atypical sclerosing and DCIS  . BREAST EXCISIONAL BIOPSY Right 2000   benign  . BREAST LUMPECTOMY Left 06/26/2018   atypical sclerosing lesion and DCIS bracketing  . BREAST   LUMPECTOMY WITH NEEDLE LOCALIZATION Left 06/26/2018   Procedure: BREAST LUMPECTOMY WITH NEEDLE LOCALIZATION;  Surgeon: Pabon, Diego F, MD;  Location: [facility] ORS;  Service: General;  Laterality: Left;  . CHOLECYSTECTOMY    . OVARIAN CYST SURGERY    . TONSILLECTOMY     age 12  . UPPER GI ENDOSCOPY  2003    Family History: Significant diagnoses include the following:  Family History  Problem Relation Age of Onset  . Hyperlipidemia Mother    . Hypertension Mother   . COPD Mother   . Heart failure Mother        deceased 91  . Stroke Father        deceased 76  . Hypertension Father   . Hypertension Sister   . Hypertension Brother   . Healthy Brother   . Breast cancer Cousin 34       daughter of paternal aunt; currently 71  . Breast cancer Cousin 59       daughter of paternal uncle; currently 59  . Breast cancer Cousin 57       daughter of maternal uncle; deceased 59  . Testicular cancer Other 37       sister's son; currently 43  . Melanoma Paternal Uncle   . Breast cancer Cousin 59       daughter of same maternal uncle; currently 66    Additionally, Susan [name] has no children. She has a sister (age 69) and 2 brothers (ages 62 and 66). Her mother died at 91, cancer-free. She did not have a BSO. Her mother had 2 brothers and a sister. Her father died at 76, cancer-free. He had 4 brothers and 4 sisters.  Susan [name]'s ancestry is Caucasian - NOS. There is no known Jewish ancestry and no consanguinity.  Discussion: We reviewed the characteristics, features and inheritance patterns of hereditary cancer syndromes. We discussed her risk of harboring a mutation in the context of her personal and family history. We discussed the process of genetic testing, insurance coverage and implications of results: positive, negative and variant of uncertain significance (VUS).   Susan [name]'s questions were answered to her satisfaction today and she is welcome to call with any additional questions or concerns. Thank you for the referral and allowing us to share in the care of your patient.    Ofri Leitner, MS, CGC Certified Genetic Counselor phone: [phone]   

## 2018-09-05 NOTE — Progress Notes (Signed)
[facility] day:  09/05/2018  Chief Complaint: Susan [name] is a 67 y.o. 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  HPI:  The patient was last seen in the medical oncology clinic on 06/25/2018 for new patient assessment.  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  She underwent left partial mastectomy with needle localization on 06/26/2018 by Dr. Caroleen Hamman.  Pathology revealed at least 28 mm grade 2-3 DCIS with sclerosing adenosis, cystic papillary apocrine metaplasia.  There was central expansive comedonecrosis.  Margins were negative (closest 1.7 mm).  Pathologic stage was pTis pNx.  She was seen by Dr. Baruch Gouty in consultation on 07/11/2018.  Notes reviewed.  She was felt to be a good candidate for hypofractionated course of treatment over 4 weeks. Plan was to boost her scar another 1600 cGy using electron beam based on the close margin.  She began radiation on 07/30/2018.  She began electron beam treatment on 08/21/2018 (completed 08/31/2018). Patient notes that treatments resulted in a rash.   Bone density on 07/26/2018 revealed osteopenia with a T-score of -1.8 in the left femoral neck. Patient takes calcium 1200 mg and vitamin D 800 IU daily.   Symptomatically, patient is doing well. She denies any acute concerns. Patient denies that she has experienced any B symptoms. She denies any interval infections. Patient advises that she maintains an adequate appetite. She is eating well. Weight today is 143 lb 6.6 oz (65.1 kg), which compared to her last visit to the clinic, represents a  2 pound loss.   Patient denies pain in the clinic today.  Patient recently learned of another cousin (age 72) with a breast cancer diagnosis, making 4 cousins with breast cancer. There is a BRCA mutation in one of her cousins.    Past Medical History:  Diagnosis Date  . Arthritis   . Cancer ([place])   .  Complication of anesthesia   . Dysrhythmia   . Gastric reflux 90's  . GERD (gastroesophageal reflux disease)   . Headache    H/O MIGRAINES  . Hiatal hernia 2003  . Hypertension   . Mitral valve regurgitation   . PONV (postoperative nausea and vomiting)    N/V  . SVT (supraventricular tachycardia) ([facility])     Past Surgical History:  Procedure Laterality Date  . APPENDECTOMY    . BREAST BIOPSY Left 06/13/2018   Affirm Bx- coil clip   atypical sclerosing and DCIS  . BREAST EXCISIONAL BIOPSY Right 2000   benign  . BREAST LUMPECTOMY Left 06/26/2018   atypical sclerosing lesion and DCIS bracketing  . BREAST LUMPECTOMY WITH NEEDLE LOCALIZATION Left 06/26/2018   Procedure: BREAST LUMPECTOMY WITH NEEDLE LOCALIZATION;  Surgeon: Jules Husbands, MD;  Location: [facility] ORS;  Service: General;  Laterality: Left;  . CHOLECYSTECTOMY    . OVARIAN CYST SURGERY    . TONSILLECTOMY     age 60  . UPPER GI ENDOSCOPY  2003    Family History  Problem Relation Age of Onset  . Hyperlipidemia Mother   . Hypertension Mother   . COPD Mother   . Heart failure Mother   . Stroke Father   . Hypertension Father   . Hypertension Sister   . Hypertension Brother   . Healthy Brother   . Breast cancer Cousin 41  . Breast cancer Cousin 29  . Breast cancer Cousin 62    Social History:  reports that  she has never smoked. She has never used smokeless tobacco. She reports that she drinks about 2.0 standard drinks of alcohol per week. She reports that she does not use drugs. She denies tobacco use. She drinks wine with dinner. Patient is a [place] native, however has spent the last several years in [place]. Patient is a retired Administrator, arts. Patient denies known exposures to radiation on toxins.  Her husband's name is Richard.  The patient is alone today.  Allergies: No Known Allergies  Current Medications: Current Outpatient Medications  Medication Sig Dispense Refill  . calcium citrate-vitamin D 500-500 MG-UNIT  chewable tablet Chew 1 tablet by mouth daily.    . cholecalciferol (VITAMIN D) 1000 units tablet Take 1,000 Units by mouth daily.    . Magnesium 250 MG TABS Take 250 mg by mouth every morning.     . metoprolol succinate (TOPROL XL) 25 MG 24 hr tablet Take 0.5 tablets (12.5 mg total) by mouth daily. (Patient taking differently: Take 12.5 mg by mouth every morning. ) 30 tablet 3  . psyllium (TGT PSYLLIUM FIBER) 0.52 g capsule Take 0.52 g by mouth 3 (three) times daily.     Marland Kitchen ALPRAZolam (XANAX) 0.5 MG tablet Take 1 tablet (0.5 mg total) by mouth at bedtime as needed. for sleep (Patient not taking: Reported on 09/05/2018) 30 tablet 1  . calcium carbonate (TUMS EX) 750 MG chewable tablet Chew 750 mg by mouth as needed for heartburn.     . fluticasone (FLONASE) 50 MCG/ACT nasal spray Place 2 sprays into both nostrils as needed.     . Lactobacillus (PROBIOTIC ACIDOPHILUS PO) Take 1 capsule by mouth daily.     . naproxen sodium (ANAPROX) 220 MG tablet Take 220 mg by mouth daily as needed (pain).     . ranitidine (ZANTAC) 150 MG tablet Take 150 mg by mouth daily as needed for heartburn.    Marland Kitchen SIMETHICONE-80 PO Take 80 mg by mouth as needed (gas).      No current facility-administered medications for this visit.     Review of Systems:  GENERAL:  Feels good.  No fevers, sweats.  Weight loss of 2 pounds. PERFORMANCE STATUS (ECOG):  0 HEENT:  No visual changes, runny nose, sore throat, mouth sores or tenderness. Lungs: No shortness of breath or cough.  No hemoptysis. Cardiac:  MVP.  No chest pain, palpitations, orthopnea, or PND. GI:  No nausea, vomiting, diarrhea, constipation, melena or hematochezia.  Colonoscopy age 78.  Cologuard last year.   GU:  No urgency, frequency, dysuria, or hematuria. Musculoskeletal:  No back pain.  No joint pain.  No muscle tenderness. Extremities:  No pain or swelling. Skin:  Rash secondary to radiation.  Neuro:  No headache, numbness or weakness, balance or coordination  issues. Endocrine:  No diabetes, thyroid issues, hot flashes or night sweats. Psych:  No mood changes, depression or anxiety. Pain:  No focal pain. Review of systems:  All other systems reviewed and found to be negative.   Physical Exam: Blood pressure 127/78, pulse 65, temperature (!) 97.4 F (36.3 C), temperature source Tympanic, resp. rate 18, weight 143 lb 6.6 oz (65.1 kg). GENERAL:  Well developed, well nourished, woman sitting comfortably in the exam room in no acute distress. MENTAL STATUS:  Alert and oriented to person, place and time. HEAD:  Short curly gray hair.  Normocephalic, atraumatic, face symmetric, no Cushingoid features. EYES:  Glasses.  Brown/hazel eyes.  Pupils equal round and reactive to light and  accomodation.  No conjunctivitis or scleral icterus. ENT:  Oropharynx clear without lesion.  Tongue normal. Mucous membranes moist.  RESPIRATORY:  Clear to auscultation without rales, wheezes or rhonchi. CARDIOVASCULAR:  Regular rate and rhythm without murmur, rub or gallop. BREAST:  Right breast with fibrocystic changes.  No masses, skin changes or nipple discharge.  Left breast with post-operative and post-radiation hyperpigmentation.  No masses, skin changes or nipple discharge. ABDOMEN:  Soft, non-tender, with active bowel sounds, and no hepatosplenomegaly.  No masses. SKIN:  No rashes, ulcers or lesions. EXTREMITIES: No edema, no skin discoloration or tenderness.  No palpable cords. LYMPH NODES: No palpable cervical, supraclavicular, axillary or inguinal adenopathy  NEUROLOGICAL: Unremarkable. PSYCH:  Appropriate.    No visits with results within 3 Day(s) from this visit.  Latest known visit with results is:  Appointment on 08/23/2018  Component Date Value Ref Range Status  . WBC 08/23/2018 5.5  3.6 - 11.0 K/uL Final  . RBC 08/23/2018 4.99  3.80 - 5.20 MIL/uL Final  . Hemoglobin 08/23/2018 15.0  12.0 - 16.0 g/dL Final  . HCT 08/23/2018 44.0  35.0 - 47.0 % Final  .  MCV 08/23/2018 88.2  80.0 - 100.0 fL Final  . MCH 08/23/2018 30.0  26.0 - 34.0 pg Final  . MCHC 08/23/2018 34.0  32.0 - 36.0 g/dL Final  . RDW 08/23/2018 13.9  11.5 - 14.5 % Final  . Platelets 08/23/2018 235  150 - 440 K/uL Final   Performed at [facility], [address] [place], [place] [address]    Assessment:  Susan [name] is a 67 y.o. 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  Pathology revealed at least 28 mm grade 2-3 DCIS with sclerosing adenosis, cystic papillary apocrine metaplasia.  There was central expansive comedonecrosis.  Margins were negative (closest 1.7 mm).  DCIS was ER positive (>90%) and PR negative (< 1%).  Pathologic stage was pTis pNx.  Diagnostic left mammogram on 06/06/2018 revealed indeterminate microcalcifications over the inner midportion of the left breast.  There were 2 adjacent groups of heterogeneous microcalcifications with some linear forms spanning 1.1 x 2.0 x 2.4 cm.   She received radiation (07/30/2018 - 08/20/2018) followed by electron beam treatment (08/21/2018 - 08/31/2018).  Bone density on 07/26/2018 revealed osteopenia with a T-score of -1.8 in the left femoral neck.  Family history is notable for 4 cousins with breast cancer. There is a BRCA mutation in one of her cousins.   Symptomatically, she is doing well. She denies acute concerns.  Exam reveals left breast post operative and post-radiation changes.  Plan: 1. Labs today:  CBC with diff, CMP. 2. Left breast DCIS  Discuss radiation course. Patient has completed therapy as of 08/31/2018. She tolerated therapy well overall.   Review hormonal treatment using tamoxifen versus AI (letrozole). Patient previously provided with information on both medications. She has decided to pursue tamoxifen.   Discuss initiation of tamoxifen. Side effects reviewed. Potential side effects (hot flashes, weight gain, increased LFTs, cataract  formation, thrombosis, uterine cancer) of tamoxifen reviewed in detail. Discussed requirements associated with this medication, which include annual eye and pelvic examinations.   Rx: Tamoxifen 20 mg daily (Disp #30).    Discuss genetic testing due to strong familial history of breast cancer with possible BRCA mutation. Patient consents to testing.  3. Osteopenia  T-score -1.8 in the left femoral neck, which is consistent with osteopenia.   Ten-year fracture probability by [facility]  of 3% or greater for hip fracture or 20% or greater for major osteoporotic fracture.  Discuss the use of Prolia to prevent bone thinning/loss associated with endocrine therapy. Side effects of this medication reviewed. Patient provided with printed information on Prolia on today's AVS.   Patient encouraged to take calcium 1200 mg and vitamin D 800 IU daily.   Discussed the need for dental clearance prior to beginning Prolia, as this medication is associated with an increased risk of dental complications such as osteonecrosis of the jaw.  4. RTC in 1 month for MD assessment and labs (CMP).   Honor Loh, NP  09/05/2018, 9:23 AM   I saw and evaluated the patient, participating in the key portions of the service and reviewing pertinent diagnostic studies and records.  I reviewed the nurse practitioner's note and agree with the findings and the plan.  The assessment and plan were discussed with the patient.  Multiple questions were asked by the patient and answered.   Nolon Stalls, MD 09/05/2018, 9:23 AM

## 2018-09-05 NOTE — Patient Instructions (Signed)
Denosumab injection What is this medicine? DENOSUMAB (den oh sue mab) slows bone breakdown. Prolia is used to treat osteoporosis in women after menopause and in men. Delton See is used to treat a high calcium level due to cancer and to prevent bone fractures and other bone problems caused by multiple myeloma or cancer bone metastases. Delton See is also used to treat giant cell tumor of the bone. This medicine may be used for other purposes; ask your health care provider or pharmacist if you have questions. COMMON BRAND NAME(S): Prolia, XGEVA What should I tell my health care provider before I take this medicine? They need to know if you have any of these conditions: -dental disease -having surgery or tooth extraction -infection -kidney disease -low levels of calcium or Vitamin D in the blood -malnutrition -on hemodialysis -skin conditions or sensitivity -thyroid or parathyroid disease -an unusual reaction to denosumab, other medicines, foods, dyes, or preservatives -pregnant or trying to get pregnant -breast-feeding How should I use this medicine? This medicine is for injection under the skin. It is given by a health care professional in a hospital or clinic setting. If you are getting Prolia, a special MedGuide will be given to you by the pharmacist with each prescription and refill. Be sure to read this information carefully each time. For Prolia, talk to your pediatrician regarding the use of this medicine in children. Special care may be needed. For Delton See, talk to your pediatrician regarding the use of this medicine in children. While this drug may be prescribed for children as young as 13 years for selected conditions, precautions do apply. Overdosage: If you think you have taken too much of this medicine contact a poison control center or emergency room at once. NOTE: This medicine is only for you. Do not share this medicine with others. What if I miss a dose? It is important not to miss your  dose. Call your doctor or health care professional if you are unable to keep an appointment. What may interact with this medicine? Do not take this medicine with any of the following medications: -other medicines containing denosumab This medicine may also interact with the following medications: -medicines that lower your chance of fighting infection -steroid medicines like prednisone or cortisone This list may not describe all possible interactions. Give your health care provider a list of all the medicines, herbs, non-prescription drugs, or dietary supplements you use. Also tell them if you smoke, drink alcohol, or use illegal drugs. Some items may interact with your medicine. What should I watch for while using this medicine? Visit your doctor or health care professional for regular checks on your progress. Your doctor or health care professional may order blood tests and other tests to see how you are doing. Call your doctor or health care professional for advice if you get a fever, chills or sore throat, or other symptoms of a cold or flu. Do not treat yourself. This drug may decrease your body's ability to fight infection. Try to avoid being around people who are sick. You should make sure you get enough calcium and vitamin D while you are taking this medicine, unless your doctor tells you not to. Discuss the foods you eat and the vitamins you take with your health care professional. See your dentist regularly. Brush and floss your teeth as directed. Before you have any dental work done, tell your dentist you are receiving this medicine. Do not become pregnant while taking this medicine or for 5 months after stopping  it. Talk with your doctor or health care professional about your birth control options while taking this medicine. Women should inform their doctor if they wish to become pregnant or think they might be pregnant. There is a potential for serious side effects to an unborn child. Talk  to your health care professional or pharmacist for more information. What side effects may I notice from receiving this medicine? Side effects that you should report to your doctor or health care professional as soon as possible: -allergic reactions like skin rash, itching or hives, swelling of the face, lips, or tongue -bone pain -breathing problems -dizziness -jaw pain, especially after dental work -redness, blistering, peeling of the skin -signs and symptoms of infection like fever or chills; cough; sore throat; pain or trouble passing urine -signs of low calcium like fast heartbeat, muscle cramps or muscle pain; pain, tingling, numbness in the hands or feet; seizures -unusual bleeding or bruising -unusually weak or tired Side effects that usually do not require medical attention (report to your doctor or health care professional if they continue or are bothersome): -constipation -diarrhea -headache -joint pain -loss of appetite -muscle pain -runny nose -tiredness -upset stomach This list may not describe all possible side effects. Call your doctor for medical advice about side effects. You may report side effects to FDA at 1-800-FDA-1088. Where should I keep my medicine? This medicine is only given in a clinic, doctor's office, or other health care setting and will not be stored at home. NOTE: This sheet is a summary. It may not cover all possible information. If you have questions about this medicine, talk to your doctor, pharmacist, or health care provider.  2018 Elsevier/Gold Standard (2017-01-03 19:17:21) Tamoxifen oral tablet What is this medicine? TAMOXIFEN (ta MOX i fen) blocks the effects of estrogen. It is commonly used to treat breast cancer. It is also used to decrease the chance of breast cancer coming back in women who have received treatment for the disease. It may also help prevent breast cancer in women who have a high risk of developing breast cancer. This  medicine may be used for other purposes; ask your health care provider or pharmacist if you have questions. COMMON BRAND NAME(S): Nolvadex What should I tell my health care provider before I take this medicine? They need to know if you have any of these conditions: -blood clots -blood disease -cataracts or impaired eyesight -endometriosis -high calcium levels -high cholesterol -irregular menstrual cycles -liver disease -stroke -uterine fibroids -an unusual reaction to tamoxifen, other medicines, foods, dyes, or preservatives -pregnant or trying to get pregnant -breast-feeding How should I use this medicine? Take this medicine by mouth with a glass of water. Follow the directions on the prescription label. You can take it with or without food. Take your medicine at regular intervals. Do not take your medicine more often than directed. Do not stop taking except on your doctor's advice. A special MedGuide will be given to you by the pharmacist with each prescription and refill. Be sure to read this information carefully each time. Talk to your pediatrician regarding the use of this medicine in children. While this drug may be prescribed for selected conditions, precautions do apply. Overdosage: If you think you have taken too much of this medicine contact a poison control center or emergency room at once. NOTE: This medicine is only for you. Do not share this medicine with others. What if I miss a dose? If you miss a dose, take it as soon  as you can. If it is almost time for your next dose, take only that dose. Do not take double or extra doses. What may interact with this medicine? Do not take this medicine with any of the following medications: -cisapride -certain medicines for irregular heart beat like dofetilide, dronedarone, quinidine -certain medicines for fungal infection like fluconazole, posaconazole -pimozide -saquinavir -thioridazine This medicine may also interact with the  following medications: -aminoglutethimide -anastrozole -bromocriptine -chemotherapy drugs -female hormones, like estrogens and birth control pills -letrozole -medroxyprogesterone -phenobarbital -rifampin -warfarin This list may not describe all possible interactions. Give your health care provider a list of all the medicines, herbs, non-prescription drugs, or dietary supplements you use. Also tell them if you smoke, drink alcohol, or use illegal drugs. Some items may interact with your medicine. What should I watch for while using this medicine? Visit your doctor or health care professional for regular checks on your progress. You will need regular pelvic exams, breast exams, and mammograms. If you are taking this medicine to reduce your risk of getting breast cancer, you should know that this medicine does not prevent all types of breast cancer. If breast cancer or other problems occur, there is no guarantee that it will be found at an early stage. Do not become pregnant while taking this medicine or for 2 months after stopping this medicine. Stop taking this medicine if you get pregnant or think you are pregnant and contact your doctor. This medicine may harm your unborn baby. Women who can possibly become pregnant should use birth control methods that do not use hormones during tamoxifen treatment and for 2 months after therapy has stopped. Talk with your health care provider for birth control advice. Do not breast feed while taking this medicine. What side effects may I notice from receiving this medicine? Side effects that you should report to your doctor or health care professional as soon as possible: -allergic reactions like skin rash, itching or hives, swelling of the face, lips, or tongue -changes in vision -changes in your menstrual cycle -difficulty walking or talking -new breast lumps -numbness -pelvic pain or pressure -redness, blistering, peeling or loosening of the skin,  including inside the mouth -signs and symptoms of a dangerous change in heartbeat or heart rhythm like chest pain, dizziness, fast or irregular heartbeat, palpitations, feeling faint or lightheaded, falls, breathing problems -sudden chest pain -swelling, pain or tenderness in your calf or leg -unusual bruising or bleeding -vaginal discharge that is bloody, brown, or rust -weakness -yellowing of the whites of the eyes or skin Side effects that usually do not require medical attention (report to your doctor or health care professional if they continue or are bothersome): -fatigue -hair loss, although uncommon and is usually mild -headache -hot flashes -impotence (in men) -nausea, vomiting (mild) -vaginal discharge (white or clear) This list may not describe all possible side effects. Call your doctor for medical advice about side effects. You may report side effects to FDA at 1-800-FDA-1088. Where should I keep my medicine? Keep out of the reach of children. Store at room temperature between 20 and 25 degrees C (68 and 77 degrees F). Protect from light. Keep container tightly closed. Throw away any unused medicine after the expiration date. NOTE: This sheet is a summary. It may not cover all possible information. If you have questions about this medicine, talk to your doctor, pharmacist, or health care provider.  2018 Elsevier/Gold Standard (2016-07-01 07:27:41)

## 2018-09-17 ENCOUNTER — Telehealth: Payer: Self-pay | Admitting: Genetic Counselor

## 2018-09-17 NOTE — Telephone Encounter (Signed)
Cancer Genetics             Telegenetics Results Disclosure   Patient Name: Susan [name] Patient DOB: [birth year] Patient Age: 67 y.o. Phone Call Date: 09/17/2018  Referring Provider: Nolon Stalls, MD Honor Loh, NP    Susan [name] was called today to discuss genetic test results. Please see the Genetics telephone note from 09/05/2018 for a detailed discussion of her personal and family histories and the recommendations provided.  Genetic Testing: At the time of Susan [name] telegenetics visit, she decided to pursue genetic testing of multiple genes associated with hereditary susceptibility to cancer. Testing included sequencing and deletion/duplication analysis. Testing did not reveal a pathogenic mutation in any of the genes analyzed.  A copy of the genetic test report will be scanned into Epic under the Media tab.  Interpretation: These results suggest that Susan [name]'s cancer was most likely not due to an inherited predisposition. Most cancers happen by chance and this test, along with details of her family history, suggests that her cancer falls into this category.  Since the current test is not perfect, it is possible that there may be a gene mutation that current testing cannot detect, but that chance is small. It is possible that a different genetic factor, which has not yet been discovered or is not on this panel, is responsible for the cancer diagnoses in the family. Again, the likelihood of this is low. No additional testing is recommended at this time for Susan [name].  Genes Analyzed: The genes analyzed were the 84 genes on [facility]'s Multi-Cancer panel (AIP, ALK, APC, ATM, AXIN2, BAP1, BARD1, BLM, BMPR1A, BRCA1, BRCA2, BRIP1, CASR, CDC73, CDH1, CDK4, CDKN1B, CDKN1C, CDKN2A, CEBPA, CHEK2, CTNNA1, DICER1, DIS3L2, EGFR, EPCAM, FH, FLCN, GATA2, GPC3, GREM1, HOXB13, HRAS, KIT, MAX, MEN1, MET, MITF, MLH1, MSH2, MSH3, MSH6, MUTYH, NBN, NF1, NF2, NTHL1,  PALB2, PDGFRA, PHOX2B, PMS2, POLD1, POLE, POT1, PRKAR1A, PTCH1, PTEN, RAD50, RAD51C, RAD51D, RB1, RECQL4, RET, RUNX1, SDHA, SDHAF2, SDHB, SDHC, SDHD, SMAD4, SMARCA4, SMARCB1, SMARCE1, STK11, SUFU, TERC, TERT, TMEM127, TP53, TSC1, TSC2, VHL, WRN, WT1).  Cancer Screening:  She is recommended to follow the cancer screening guidelines provided by her physician.   Family Members: Family members may be at some increased risk of developing cancer, over the general population risk, simply due to the family history. Women are recommended to have a yearly mammogram beginning at age 75, a yearly clinical breast exam, a yearly gynecologic exam and perform monthly breast self-exams. Colon cancer screening is recommended to begin by age 55 in both men and women, unless there is a family history of colon cancer or colon polyps or an individual has a personal history to warrant initiating screening at a younger age.  Any relative who had cancer at a young age or had a particularly rare cancer may also wish to pursue genetic testing. Genetic counselors can be located in other cities, by visiting the website of the [employer] ([employer]ist[employer]se) and Field seismologist for a Dietitian by zip code.   Lastly, cancer genetics is a rapidly advancing field and it is possible that new genetic tests will be appropriate for Susan [name] in the future. Susan [name] is encouraged to remain in contact with Genetics on an annual basis so we can update her personal and family histories, and let her know of advances in cancer genetics that may  benefit the family. Susan [name] questions were answered to her satisfaction today, and she knows she is welcome to call anytime with additional questions.    Steele Berg, MS, [place] Certified Genetic Counselor phone: [phone]

## 2018-09-18 ENCOUNTER — Other Ambulatory Visit: Payer: Self-pay | Admitting: Cardiovascular Disease

## 2018-09-28 ENCOUNTER — Other Ambulatory Visit: Payer: Self-pay | Admitting: Urgent Care

## 2018-10-01 ENCOUNTER — Other Ambulatory Visit: Payer: PPO

## 2018-10-01 ENCOUNTER — Ambulatory Visit: Payer: PPO | Admitting: Hematology and Oncology

## 2018-10-03 ENCOUNTER — Other Ambulatory Visit: Payer: PPO

## 2018-10-03 ENCOUNTER — Ambulatory Visit: Payer: PPO | Admitting: Hematology and Oncology

## 2018-10-05 ENCOUNTER — Ambulatory Visit (INDEPENDENT_AMBULATORY_CARE_PROVIDER_SITE_OTHER): Payer: PPO | Admitting: Nurse Practitioner

## 2018-10-05 ENCOUNTER — Ambulatory Visit
Admission: RE | Admit: 2018-10-05 | Discharge: 2018-10-05 | Disposition: A | Discharge status: Home / Self Care | Payer: PPO | Source: Ambulatory Visit | Attending: Radiation Oncology | Admitting: Radiation Oncology

## 2018-10-05 ENCOUNTER — Encounter: Payer: Self-pay | Admitting: Nurse Practitioner

## 2018-10-05 ENCOUNTER — Inpatient Hospital Stay (HOSPITAL_BASED_OUTPATIENT_CLINIC_OR_DEPARTMENT_OTHER): Payer: PPO | Admitting: Hematology and Oncology

## 2018-10-05 ENCOUNTER — Other Ambulatory Visit: Payer: Self-pay

## 2018-10-05 ENCOUNTER — Telehealth: Payer: Self-pay | Admitting: Nurse Practitioner

## 2018-10-05 ENCOUNTER — Inpatient Hospital Stay: Payer: PPO | Attending: Hematology and Oncology

## 2018-10-05 ENCOUNTER — Encounter: Payer: Self-pay | Admitting: Hematology and Oncology

## 2018-10-05 VITALS — BP 124/80 | HR 69 | Temp 97.8°F | Ht 67.32 in | Wt 145.0 lb

## 2018-10-05 VITALS — BP 128/80 | HR 62 | Temp 98.2°F | Resp 18 | Wt 144.6 lb

## 2018-10-05 DIAGNOSIS — E875 Hyperkalemia: Secondary | ICD-10-CM | POA: Diagnosis not present

## 2018-10-05 DIAGNOSIS — M8580 Other specified disorders of bone density and structure, unspecified site: Secondary | ICD-10-CM | POA: Insufficient documentation

## 2018-10-05 DIAGNOSIS — Z79810 Long term (current) use of selective estrogen receptor modulators (SERMs): Secondary | ICD-10-CM | POA: Diagnosis not present

## 2018-10-05 DIAGNOSIS — D0512 Intraductal carcinoma in situ of left breast: Secondary | ICD-10-CM | POA: Diagnosis not present

## 2018-10-05 DIAGNOSIS — M85852 Other specified disorders of bone density and structure, left thigh: Secondary | ICD-10-CM

## 2018-10-05 DIAGNOSIS — Z170 Estrogen receptor positive status [ER+]: Secondary | ICD-10-CM

## 2018-10-05 DIAGNOSIS — Z79899 Other long term (current) drug therapy: Secondary | ICD-10-CM

## 2018-10-05 LAB — COMPREHENSIVE METABOLIC PANEL
ALT: 17 U/L (ref 0–44)
AST: 23 U/L (ref 15–41)
Albumin: 4.2 g/dL (ref 3.5–5.0)
Alkaline Phosphatase: 53 U/L (ref 38–126)
Anion gap: 6 (ref 5–15)
BUN: 22 mg/dL (ref 8–23)
CO2: 29 mmol/L (ref 22–32)
Calcium: 9.7 mg/dL (ref 8.9–10.3)
Chloride: 103 mmol/L (ref 98–111)
Creatinine, Ser: 0.76 mg/dL (ref 0.44–1.00)
GFR calc Af Amer: >60 mL/min (ref >60)
GFR calc non Af Amer: >60 mL/min (ref >60)
Glucose, Bld: 96 mg/dL (ref 70–99)
Potassium: 5.6 mmol/L — ABNORMAL HIGH (ref 3.5–5.1)
Sodium: 138 mmol/L (ref 135–145)
Total Bilirubin: 1.2 mg/dL (ref 0.3–1.2)
Total Protein: 6.7 g/dL (ref 6.5–8.1)

## 2018-10-05 MED ORDER — TAMOXIFEN CITRATE 20 MG PO TABS: 20.0000 mg | ORAL_TABLET | Freq: Every day | ORAL | 3 refills | Status: DC

## 2018-10-05 NOTE — Progress Notes (Signed)
Subjective:  Patient ID: Susan [name], female    DOB: 10-04-1951  Age: 67 y.o. MRN: [id number]  CC: Follow-up (high postassium consult/ pt stated she has heart valve issue aready so not if she notice more than normal. )  HPI Hyperkalemia: First noted 05/2018, ranged from 5.3 to 5.6. Reports diet consistent mostly of dark green vegetables and fruit. Drinks half bottle of wine per day. Intermittent palpitation due to atrial tachycardia, chronic per patient , no change, Use of metoprolol 12.5mg  since 03/2017, no change in dose, followed by cardiology every 1months per patient. Last seen 10/2017, also has hx of mitral regurgitation. Use of naproxen prn, last taken yesterday: 2tabs, typically takes 1tab per week for bilateral hip pain (chronic, intermittent, worse with excessive walking). Started power walk in last 70month, 30miles daily,  Weakness or paresthesia: denies Breast cancer treatment, Completed radiation 08/31/18, current use to tamixifen ECG: last done 06/25/2018, normal GI bleed: no signs (no melena, no epigastric pain, no hematemesis, no blood in stool) OTC supplement: use on calcium and magnesium BID Use of TUMs and ranitidine sparingly  Reviewed past Medical, Social and Family history today.  Outpatient Medications Prior to Visit  Medication Sig Dispense Refill  . ALPRAZolam (XANAX) 0.5 MG tablet Take 1 tablet (0.5 mg total) by mouth at bedtime as needed. for sleep 30 tablet 1  . calcium carbonate (TUMS EX) 750 MG chewable tablet Chew 750 mg by mouth as needed for heartburn.     . calcium citrate-vitamin D 500-500 MG-UNIT chewable tablet Chew 1 tablet by mouth 2 (two) times daily.    . cholecalciferol (VITAMIN D) 1000 units tablet Take 1,000 Units by mouth daily.    . fluticasone (FLONASE) 50 MCG/ACT nasal spray Place 2 sprays into both nostrils as needed.     . Lactobacillus (PROBIOTIC ACIDOPHILUS PO) Take 1 capsule by mouth daily.     . Magnesium 250 MG TABS Take 250 mg by  mouth 2 (two) times daily after a meal.    . metoprolol succinate (TOPROL-XL) 25 MG 24 hr tablet Take 0.5 tablets (12.5 mg total) by mouth every morning. 15 tablet 6  . naproxen sodium (ANAPROX) 220 MG tablet Take 220 mg by mouth daily as needed (pain).     . psyllium (TGT PSYLLIUM FIBER) 0.52 g capsule Take 0.52 g by mouth 3 (three) times daily.     . ranitidine (ZANTAC) 150 MG tablet Take 150 mg by mouth daily as needed for heartburn.    Marland Kitchen SIMETHICONE-80 PO Take 80 mg by mouth as needed (gas).     . tamoxifen (NOLVADEX) 20 MG tablet Take 1 tablet (20 mg total) by mouth daily. 90 tablet 3   No facility-administered medications prior to visit.     ROS See HPI  Objective:  BP 124/80   Pulse 69   Temp 97.8 F (36.6 C) (Oral)   Ht 5' 7.32" (1.71 m)   Wt 145 lb (65.8 kg)   SpO2 97%   BMI 22.49 kg/m   BP Readings from Last 3 Encounters:  10/05/18 124/80  10/05/18 128/80  09/05/18 127/78    Wt Readings from Last 3 Encounters:  10/05/18 145 lb (65.8 kg)  10/05/18 144 lb 9.6 oz (65.6 kg)  09/05/18 143 lb 6.6 oz (65.1 kg)    Physical Exam  Constitutional: She is oriented to person, place, and time. She appears well-developed and well-nourished.  Cardiovascular: Normal rate, regular rhythm, normal heart sounds and intact distal pulses.  Pulmonary/Chest:  Effort normal and breath sounds normal.  Abdominal: Soft. Bowel sounds are normal. There is no tenderness.  Musculoskeletal: She exhibits no edema.  Neurological: She is alert and oriented to person, place, and time.  Vitals reviewed.   Lab Results  Component Value Date   WBC 5.2 09/05/2018   HGB 15.5 09/05/2018   HCT 47.0 09/05/2018   PLT 230 09/05/2018   GLUCOSE 96 10/05/2018   CHOL 202 (H) 05/30/2018   TRIG 72.0 05/30/2018   HDL 66.00 05/30/2018   LDLCALC 122 (H) 05/30/2018   ALT 17 10/05/2018   AST 23 10/05/2018   NA 138 10/05/2018   K 5.6 (H) 10/05/2018   CL 103 10/05/2018   CREATININE 0.76 10/05/2018   BUN  22 10/05/2018   CO2 29 10/05/2018   TSH 1.89 05/30/2018   HGBA1C 5.7 10/02/2013    Assessment & Plan:   Susan [name] was seen today for follow-up.  Diagnoses and all orders for this visit:  Hyperkalemia -     Cancel: Basic metabolic panel; Future -     Basic metabolic panel; Future -     Magnesium; Future   I am having Susan [name] maintain her naproxen sodium, psyllium, calcium carbonate, Lactobacillus (PROBIOTIC ACIDOPHILUS PO), SIMETHICONE-80 PO, ALPRAZolam, cholecalciferol, fluticasone, Magnesium, calcium citrate-vitamin D, ranitidine, metoprolol succinate, and tamoxifen.  No orders of the defined types were placed in this encounter.   Follow-up: No follow-ups on file.  Wilfred Lacy, NP

## 2018-10-05 NOTE — Telephone Encounter (Signed)
Left vm for the pt to call back, need to make an appt with Baldo Ash today if pt is okey with it. Dr. Deborra Medina is out of the office today.

## 2018-10-05 NOTE — Progress Notes (Signed)
Radiation Oncology Follow up Note  Name: Susan [name]   Date:   10/05/2018 MRN:  [id number] DOB: 03-Jun-1951    This 67 y.o. 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  REFERRING PROVIDER: Lucille Passy, MD  HPI: patient is a 67 year old female now out 1 month having completed whole breast radiation to her left breast for stage 0 ductal carcinoma in situ ER/PR positive. Seen today in routine follow-up she is doing well. She has had some retraction of the nipple secondary to surgery which is causing her to have to clean around the nipple our oral a complex on a regular basis.Marland Kitchenshe is started on tamoxifen is tolerating that well without side effect.  COMPLICATIONS OF TREATMENT: none  FOLLOW UP COMPLIANCE: keeps appointments   PHYSICAL EXAM:  There were no vitals taken for this visit. The left breast nipple is retracted towards the right side secondary to her surgery along the nipple areolar complex. No dominant mass or nodularity is noted in either breast in 2 positions examined. No axillary or supraclavicular adenopathy is appreciated.Well-developed well-nourished patient in NAD. HEENT reveals PERLA, EOMI, discs not visualized.  Oral cavity is clear. No oral mucosal lesions are identified. Neck is clear without evidence of cervical or supraclavicular adenopathy. Lungs are clear to A&P. Cardiac examination is essentially unremarkable with regular rate and rhythm without murmur rub or thrill. Abdomen is benign with no organomegaly or masses noted. Motor sensory and DTR levels are equal and symmetric in the upper and lower extremities. Cranial nerves II through XII are grossly intact. Proprioception is intact. No peripheral adenopathy or edema is identified. No motor or sensory levels are noted. Crude visual fields are within normal range.  RADIOLOGY  RESULTS: no current films for review  PLAN: present time patient is doing well with no evidence of disease 1 month out from whole breast radiation. I am please were overall progress. I've asked to see her back in 4-5 months for follow-up. Patient continues on tamoxifen without side effect. Patient knows to call with any concerns.  I would like to take this opportunity to thank you for allowing me to participate in the care of your patient.Noreene Filbert, MD

## 2018-10-05 NOTE — Patient Instructions (Addendum)
Hold magnesium supplement Maintain adequate oral hydration with water mostly. Return to [facility] office in 1week for repeat BMP and magnesium. She requested for labs to be drawn at [facility] office.  Perform urine osmolarity and cortisol if persistent hyperkalemia  Hyperkalemia Hyperkalemia is when you have too much potassium in your blood. Potassium is normally removed (excreted) from your body by your kidneys. If there is too much potassium in your blood, it can affect your heart's ability to function. What are the causes? Hyperkalemia may be caused by:  Taking in too much potassium. You can do this by: ? Using salt substitutes. They contain large amounts of potassium. ? Taking potassium supplements. ? Eating foods high in potassium.  Excreting too little potassium. This can happen if: ? Your kidneys are not working properly. Kidney (renal) disease, including short- or long-term renal failure, is a very common cause of hyperkalemia. ? You are taking medicines that lower your excretion of potassium. ? You have Addison disease. ? You have a urinary tract blockage, such as kidney stones. ? You are on treatment to mechanically clean your blood (dialysis) and you skip a treatment.  Releasing a high amount of potassium from your cells into your blood. This can happen with: ? Injury to muscles (rhabdomyolysis) or other tissues. Most potassium is stored in your muscles. ? Severe burns or infections. ? Acidic blood plasma (acidosis). Acidosis can result from many diseases, such as uncontrolled diabetes.  What increases the risk? The most common risk factor of hyperkalemia is kidney disease. Other risk factors of hyperkalemia include:  Addison disease. This is a condition where your glands do not produce enough hormones.  Alcoholism or heavy drug use.  Using certain blood pressure medicines, such as angiotensin-converting enzyme (ACE) inhibitors, angiotensin II receptor blockers  (ARBs), or potassium-sparing diuretics such as spironolactone.  Severe injury or burn.  What are the signs or symptoms? Oftentimes, there are no signs or symptoms of hyperkalemia. However, when your potassium level becomes high enough, you may experience symptoms such as:  Irregular or very slow heartbeat.  Nausea.  Fatigue.  Tingling of the skin or numbness of the hands or feet.  Muscle weakness.  Fatigue.  Not being able to move (paralysis).  You may not have any symptoms of hyperkalemia. How is this diagnosed? Hyperkalemia may be diagnosed by:  Physical exam.  Blood tests.  ECG (electrocardiogram).  Discussion of prescription and non-prescription drug use.  How is this treated? Treatment for hyperkalemia is often directed at the underlying cause. In some instances, treatment may include:  Insulin.  Glucose (sugar) and water solution given through a vein (intravenous or IV).  Dialysis.  Medicines to remove the potassium from your body.  Medicines to move calcium from your bloodstream into your tissues.  Follow these instructions at home:  Take medicines only as directed by your health care provider.  Do not take any supplements, natural products, herbs, or vitamins without reviewing them with your health care provider. Certain supplements and natural food products can have high amounts of potassium.  Limit your alcohol intake as directed by your health care provider.  Stop illegal drug use. If you need help quitting, ask your health care provider.  Keep all follow-up visits as directed by your health care provider. This is important.  If you have kidney disease, you may need to follow a low potassium diet. A dietitian can help educate you on low potassium foods. Contact a health care provider if:  You notice an irregular or very slow heartbeat.  You feel light-headed.  You feel weak.  You are nauseous.  You have tingling or numbness in your  hands or feet. Get help right away if:  You have shortness of breath.  You have chest pain or discomfort.  You pass out.  You have muscle paralysis. This information is not intended to replace advice given to you by your health care provider. Make sure you discuss any questions you have with your health care provider. Document Released: 12/02/2002 Document Revised: 05/19/2016 Document Reviewed: 03/19/2014 Elsevier Interactive Patient Education  2018 [employer].

## 2018-10-05 NOTE — Progress Notes (Signed)
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  HPI:  The patient was last seen in the medical oncology clinic on 09/05/2018 for following left partial mastectomy and radiation.  She was doing well.  Bone density revealed osteopenia.  We discussed initiation of endocrine therapy.    She began tamoxifen after her last visit.  During the interim, she notes "so far, so good".  She denies any complaints.  She notes no change in hot flashes.   Past Medical History:  Diagnosis Date  . Arthritis   . Breast cancer ([place])    Left DCIS  . Cancer ([place])   . Complication of anesthesia   . Dysrhythmia   . Family history of breast cancer   . Gastric reflux 90's  . GERD (gastroesophageal reflux disease)   . Headache    H/O MIGRAINES  . Hiatal hernia 2003  . Hypertension   . Mitral valve regurgitation   . PONV (postoperative nausea and vomiting)    N/V  . SVT (supraventricular tachycardia) ([facility])     Past Surgical History:  Procedure Laterality Date  . APPENDECTOMY    . BREAST BIOPSY Left 06/13/2018   Affirm Bx- coil clip   atypical sclerosing and DCIS  . BREAST EXCISIONAL BIOPSY Right 2000   benign  . BREAST LUMPECTOMY Left 06/26/2018   atypical sclerosing lesion and DCIS bracketing  . BREAST LUMPECTOMY WITH NEEDLE LOCALIZATION Left 06/26/2018   Procedure: BREAST LUMPECTOMY WITH NEEDLE LOCALIZATION;  Surgeon: Jules Husbands, MD;  Location: [facility] ORS;  Service: General;  Laterality: Left;  . CHOLECYSTECTOMY    . OVARIAN CYST SURGERY    . TONSILLECTOMY     age 40  . UPPER GI ENDOSCOPY  2003    Family History  Problem Relation Age of Onset  . Hyperlipidemia Mother   . Hypertension Mother   . COPD Mother   . Heart failure Mother        deceased 40  . Stroke Father        deceased 16  . Hypertension Father   . Hypertension Sister    . Hypertension Brother   . Healthy Brother   . Breast cancer Cousin 61       daughter of paternal aunt; currently 47  . Breast cancer Cousin 20       daughter of paternal uncle; currently 79  . Breast cancer Cousin 41       daughter of maternal uncle; deceased 38  . Testicular cancer Other 13       sister's son; currently 6  . Melanoma Paternal Uncle   . Breast cancer Cousin 11       daughter of same maternal uncle; currently 50    Social History:  reports that she has never smoked. She has never used smokeless tobacco. She reports that she drinks about 2.0 standard drinks of alcohol per week. She reports that she does not use drugs. She denies tobacco use. She drinks wine with dinner. Patient is a [place] native, however has spent the last several years in [place]. Patient is a retired Administrator, arts. Patient denies known exposures to radiation on toxins.  The patient is alone today.  Allergies: No Known Allergies  Current Medications: Current Outpatient Medications  Medication Sig Dispense Refill  . ALPRAZolam (XANAX) 0.5 MG tablet Take 1  tablet (0.5 mg total) by mouth at bedtime as needed. for sleep 30 tablet 1  . calcium carbonate (TUMS EX) 750 MG chewable tablet Chew 750 mg by mouth as needed for heartburn.     . calcium citrate-vitamin D 500-500 MG-UNIT chewable tablet Chew 1 tablet by mouth 2 (two) times daily.    . cholecalciferol (VITAMIN D) 1000 units tablet Take 1,000 Units by mouth daily.    . fluticasone (FLONASE) 50 MCG/ACT nasal spray Place 2 sprays into both nostrils as needed.     . metoprolol succinate (TOPROL-XL) 25 MG 24 hr tablet Take 0.5 tablets (12.5 mg total) by mouth every morning. 15 tablet 6  . naproxen sodium (ANAPROX) 220 MG tablet Take 220 mg by mouth daily as needed (pain).     . psyllium (TGT PSYLLIUM FIBER) 0.52 g capsule Take 0.52 g by mouth 3 (three) times daily.     . ranitidine (ZANTAC) 150 MG tablet Take 150 mg by mouth daily as needed for  heartburn.    Marland Kitchen SIMETHICONE-80 PO Take 80 mg by mouth as needed (gas).     . tamoxifen (NOLVADEX) 20 MG tablet Take 1 tablet (20 mg total) by mouth daily. 90 tablet 3   No current facility-administered medications for this visit.     Review of Systems:  GENERAL:  Feels good.  Active.  No fevers, sweats or weight loss.  Weight up 1 pound. PERFORMANCE STATUS (ECOG):  0 HEENT:  No visual changes, runny nose, sore throat, mouth sores or tenderness. Lungs: No shortness of breath or cough.  No hemoptysis. Cardiac:  No chest pain, palpitations, orthopnea, or PND. GI:  No nausea, vomiting, diarrhea, constipation, melena or hematochezia. Cologuard last year. GU:  No urgency, frequency, dysuria, or hematuria. Musculoskeletal:  No back pain.  No joint pain.  No muscle tenderness. Extremities:  No pain or swelling. Skin:  No rashes or skin changes. Neuro:  No headache, numbness or weakness, balance or coordination issues. Endocrine:  No diabetes or thyroid issues. She has some hot flashes/night sweats (no change). Psych:  No mood changes, depression or anxiety. Pain:  No focal pain. Review of systems:  All other systems reviewed and found to be negative.   Physical Exam: Blood pressure 128/80, pulse 62, temperature 98.2 F (36.8 C), temperature source Tympanic, resp. rate 18, weight 144 lb 9.6 oz (65.6 kg). GENERAL:  Well developed, well nourished, woman sitting comfortably in the exam room in no acute distress. MENTAL STATUS:  Alert and oriented to person, place and time. HEAD:  Short gray hair.  Normocephalic, atraumatic, face symmetric, no Cushingoid features. EYES:  Glasses.  Brown eyes.  No conjunctivitis or scleral icterus. NEUROLOGICAL: Unremarkable. PSYCH:  Appropriate.    Appointment on 10/05/2018  Component Date Value Ref Range Status  . Sodium 10/05/2018 138  135 - 145 mmol/L Final  . Potassium 10/05/2018 5.6* 3.5 - 5.1 mmol/L Final  . Chloride 10/05/2018 103  98 - 111 mmol/L  Final  . CO2 10/05/2018 29  22 - 32 mmol/L Final  . Glucose, Bld 10/05/2018 96  70 - 99 mg/dL Final  . BUN 10/05/2018 22  8 - 23 mg/dL Final  . Creatinine, Ser 10/05/2018 0.76  0.44 - 1.00 mg/dL Final  . Calcium 10/05/2018 9.7  8.9 - 10.3 mg/dL Final  . Total Protein 10/05/2018 6.7  6.5 - 8.1 g/dL Final  . Albumin 10/05/2018 4.2  3.5 - 5.0 g/dL Final  . AST 10/05/2018 23  15 -  41 U/L Final  . ALT 10/05/2018 17  0 - 44 U/L Final  . Alkaline Phosphatase 10/05/2018 53  38 - 126 U/L Final  . Total Bilirubin 10/05/2018 1.2  0.3 - 1.2 mg/dL Final  . GFR calc non Af Amer 10/05/2018 >60  >60 mL/min Final  . GFR calc Af Amer 10/05/2018 >60  >60 mL/min Final   Comment: (NOTE) The eGFR has been calculated using the CKD EPI equation. This calculation has not been validated in all clinical situations. eGFR's persistently <60 mL/min signify possible Chronic Kidney Disease.   Susan [name] gap 10/05/2018 6  5 - 15 Final   Performed at [facility], [address] [place], [place] [address]    Assessment:  Susan [name] is a 67 y.o. 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  Pathology revealed at least 28 mm grade 2-3 DCIS with sclerosing adenosis, cystic papillary apocrine metaplasia.  There was central expansive comedonecrosis.  Margins were negative (closest 1.7 mm).  DCIS was ER positive (>90%) and PR negative (< 1%).  Pathologic stage was pTis pNx.  Diagnostic left mammogram on 06/06/2018 revealed indeterminate microcalcifications over the inner midportion of the left breast.  There were 2 adjacent groups of heterogeneous microcalcifications with some linear forms spanning 1.1 x 2.0 x 2.4 cm.   She received radiation (07/30/2018 - 08/20/2018) followed by electron beam treatment (08/21/2018 - 08/31/2018).  She began tamoxifen on 09/05/2018.  Bone density on 07/26/2018 revealed osteopenia with a T-score of -1.8 in the left femoral  neck.  Symptomatically, she denies any new complaints.  She notes no change in hot flashes/night sweats.  She is tolerating tamoxifen well.  Plan: 1. Labs today:  CMP. 2. Left breast DCIS: Patient tolerating tamoxifen well. Rx:  Tamoxifen 20 mg a day; dis: #90 with 3 refills. 3. Osteopenia: Continue calcium and vitamin D. 4. RTC in 3 months for labs (LFTs). 5. RTC in 6 months for MD assessment and labs (CBC with diff, CMP).   Lequita Asal, MD  10/05/2018, 4:36 PM

## 2018-10-05 NOTE — Telephone Encounter (Signed)
I was contacted by Dr. Mike Gip (oncology) today. She informed me about persistent hyperkalemia for [name] for last 59month.

## 2018-10-05 NOTE — Progress Notes (Signed)
Patient here for follow. She took last tamoxifen pill yesterday and would like 90 days supply.

## 2018-10-12 ENCOUNTER — Other Ambulatory Visit (INDEPENDENT_AMBULATORY_CARE_PROVIDER_SITE_OTHER): Payer: PPO

## 2018-10-12 DIAGNOSIS — E875 Hyperkalemia: Secondary | ICD-10-CM | POA: Diagnosis not present

## 2018-10-12 LAB — BASIC METABOLIC PANEL
BUN: 17 mg/dL (ref 6–23)
CHLORIDE: 103 meq/L (ref 96–112)
CO2: 27 meq/L (ref 19–32)
CREATININE: 0.87 mg/dL (ref 0.40–1.20)
Calcium: 8.9 mg/dL (ref 8.4–10.5)
GFR: 68.78 mL/min (ref >60.00)
Glucose, Bld: 118 mg/dL — ABNORMAL HIGH (ref 70–99)
POTASSIUM: 4.3 meq/L (ref 3.5–5.1)
Sodium: 137 mEq/L (ref 135–145)

## 2018-10-12 LAB — MAGNESIUM: Magnesium: 1.9 mg/dL (ref 1.5–2.5)

## 2018-10-31 DIAGNOSIS — C44622 Squamous cell carcinoma of skin of right upper limb, including shoulder: Secondary | ICD-10-CM | POA: Diagnosis not present

## 2018-10-31 DIAGNOSIS — D485 Neoplasm of uncertain behavior of skin: Secondary | ICD-10-CM | POA: Diagnosis not present

## 2018-10-31 DIAGNOSIS — C4492 Squamous cell carcinoma of skin, unspecified: Secondary | ICD-10-CM

## 2018-10-31 DIAGNOSIS — R234 Changes in skin texture: Secondary | ICD-10-CM | POA: Diagnosis not present

## 2018-10-31 HISTORY — DX: Squamous cell carcinoma of skin, unspecified: C44.92

## 2018-11-04 NOTE — Progress Notes (Signed)
Cardiology Office Note  Date:  11/06/2018   ID:  Yvana Samonte, DOB [birth year], MRN [id number]  PCP:  Lucille Passy, MD   Chief Complaint  Patient presents with  . other    12 month f/u no complaints today. Meds reviewed verbally with pt.    HPI:  Nhyla Nappi is a 67 y.o. female  Mild to moderate MR Nonsmoker Non dibetic Reasonable cholesterol History of nonsustained VT 9 beats seen on event monitor 10 runs of atrial tachycardia/supraventricular tachycardia on event monitor Who presents for follow-up of her arrhythmia  Breast CA XRT finished sept 6th High potassium, stopped magnesium No significnat palpitations.  Discussed previous echocardiogram with her Mild to moderate MR April 2019  No significant shortness of breath, leg edema, fluid retention  Denies tachycardia no  near syncope  Otherwise active, no complaints  EKG personally reviewed by myself on todays visit Shows sinus bradycardia rate 69 bpm no significant  ST or T wave changes   Other past medical history reviewed Long-term monitor 04/25/2017: Overall rhythm was sinus.  One run of slightly wide complex tachycardia, 8 beats at average of 179 BPM.   10 episodes of supraventricular tachycardia. Fastest was 16 seconds at 148 BPM. The longest was 42.8 seconds at 138 BPM. One was with a documented symptom. No evidence of significant pauses or AV block, or atrial fibrillation.  PMH:   has a past medical history of Arthritis, Breast cancer ([place]), Cancer ([place]), Complication of anesthesia, Dysrhythmia, Family history of breast cancer, Gastric reflux (90's), GERD (gastroesophageal reflux disease), Headache, Hiatal hernia (2003), Hypertension, Mitral valve regurgitation, PONV (postoperative nausea and vomiting), and SVT (supraventricular tachycardia) ([place]).  PSH:    Past Surgical History:  Procedure Laterality Date  . APPENDECTOMY    . BREAST BIOPSY Left 06/13/2018   Affirm Bx- coil clip   atypical  sclerosing and DCIS  . BREAST EXCISIONAL BIOPSY Right 2000   benign  . BREAST LUMPECTOMY Left 06/26/2018   atypical sclerosing lesion and DCIS bracketing  . BREAST LUMPECTOMY WITH NEEDLE LOCALIZATION Left 06/26/2018   Procedure: BREAST LUMPECTOMY WITH NEEDLE LOCALIZATION;  Surgeon: Jules Husbands, MD;  Location: [facility] ORS;  Service: General;  Laterality: Left;  . CHOLECYSTECTOMY    . OVARIAN CYST SURGERY    . TONSILLECTOMY     age 47  . UPPER GI ENDOSCOPY  2003    Current Outpatient Medications  Medication Sig Dispense Refill  . ALPRAZolam (XANAX) 0.5 MG tablet Take 1 tablet (0.5 mg total) by mouth at bedtime as needed. for sleep 30 tablet 1  . calcium carbonate (TUMS EX) 750 MG chewable tablet Chew 750 mg by mouth as needed for heartburn.     . calcium citrate-vitamin D 500-500 MG-UNIT chewable tablet Chew 1 tablet by mouth 2 (two) times daily.    . cholecalciferol (VITAMIN D) 1000 units tablet Take 1,000 Units by mouth daily.    . fluticasone (FLONASE) 50 MCG/ACT nasal spray Place 2 sprays into both nostrils as needed.     . metoprolol succinate (TOPROL-XL) 25 MG 24 hr tablet Take 0.5 tablets (12.5 mg total) by mouth every morning. 15 tablet 6  . naproxen sodium (ANAPROX) 220 MG tablet Take 220 mg by mouth daily as needed (pain).     . psyllium (TGT PSYLLIUM FIBER) 0.52 g capsule Take 0.52 g by mouth 3 (three) times daily.     . ranitidine (ZANTAC) 150 MG tablet Take 150 mg by mouth daily as needed for heartburn.    Marland Kitchen  SIMETHICONE-80 PO Take 80 mg by mouth as needed (gas).     . tamoxifen (NOLVADEX) 20 MG tablet Take 1 tablet (20 mg total) by mouth daily. 90 tablet 3   No current facility-administered medications for this visit.      Allergies:   Patient has no known allergies.   Social History:  The patient  reports that she has never smoked. She has never used smokeless tobacco. She reports that she drinks about 2.0 standard drinks of alcohol per week. She reports that she does not  use drugs.   Family History:   family history includes Breast cancer (age of onset: 15) in her cousin; Breast cancer (age of onset: 15) in her cousin; Breast cancer (age of onset: 12) in her cousin and cousin; COPD in her mother; Healthy in her brother; Heart failure in her mother; Hyperlipidemia in her mother; Hypertension in her brother, father, mother, and sister; Melanoma in her paternal uncle; Stroke in her father; Testicular cancer (age of onset: 37) in her other.    Review of Systems: Review of Systems  Constitutional: Negative.   Respiratory: Negative.   Cardiovascular: Negative.   Gastrointestinal: Negative.   Musculoskeletal: Negative.   Neurological: Negative.   Psychiatric/Behavioral: Negative.   All other systems reviewed and are negative.   PHYSICAL EXAM: VS:  BP 110/70 (BP Location: Left Arm, Patient Position: Sitting, Cuff Size: Normal)   Pulse 69   Ht 5' 7.5" (1.715 m)   Wt 144 lb 12 oz (65.7 kg)   BMI 22.34 kg/m  , BMI Body mass index is 22.34 kg/m. Constitutional:  oriented to person, place, and time. No distress.  HENT:  Head: Normocephalic and atraumatic.  Eyes:  no discharge. No scleral icterus.  Neck: Normal range of motion. Neck supple. No JVD present.  Cardiovascular: Normal rate, regular rhythm, normal heart sounds and intact distal pulses. Exam reveals no gallop and no friction rub. No edema No murmur heard. Pulmonary/Chest: Effort normal and breath sounds normal. No stridor. No respiratory distress.  no wheezes.  no rales.  no tenderness.  Abdominal: Soft.  no distension.  no tenderness.  Musculoskeletal: Normal range of motion.  no  tenderness or deformity.  Neurological:  normal muscle tone. Coordination normal. No atrophy Skin: Skin is warm and dry. No rash noted. not diaphoretic.  Psychiatric:  normal mood and affect. behavior is normal. Thought content normal.    Recent Labs: 05/30/2018: TSH 1.89 09/05/2018: Hemoglobin 15.5; Platelets  230 10/05/2018: ALT 17 10/12/2018: BUN 17; Creatinine, Ser 0.87; Magnesium 1.9; Potassium 4.3; Sodium 137    Lipid Panel Lab Results  Component Value Date   CHOL 202 (H) 05/30/2018   HDL 66.00 05/30/2018   LDLCALC 122 (H) 05/30/2018   TRIG 72.0 05/30/2018      Wt Readings from Last 3 Encounters:  11/06/18 144 lb 12 oz (65.7 kg)  10/05/18 145 lb (65.8 kg)  10/05/18 144 lb 9.6 oz (65.6 kg)       ASSESSMENT AND PLAN:  Essential hypertension Blood pressure is well controlled on today's visit. No changes made to the medications. Stable  Palpitations - Plan: EKG 12-Lead Denies any significant symptomatic palpitations on low-dose metoprolol 12.5 daily No medication changes no further work-up  Atrial tachycardia (HCC) - Plan: EKG 12-Lead Previously with rare short episodes, denies any symptoms at this time  Non-rheumatic mitral regurgitation No significant murmur on exam Previously with mild to moderate MR April 2019  Run of nonsustained VT  previously seen  on monitor Continue low-dose metoprolol  Breast cancer with radiation Discussed long-term possible ramifications including accelerated coronary disease Cholesterol little bit above 200 Likely does not need a statin but did discuss various screening studies for coronary calcification She is interested in CT coronary calcium score, order has been placed and she can schedule as she pleases   Disposition:   F/U as needed   Total encounter time more than 25 minutes  Greater than 50% was spent in counseling and coordination of care with the patient    Orders Placed This Encounter  Procedures  . EKG 12-Lead     Signed, Esmond Plants, M.D., Ph.D. 11/06/2018  [place], [place]

## 2018-11-06 ENCOUNTER — Encounter: Payer: Self-pay | Admitting: Cardiovascular Disease

## 2018-11-06 ENCOUNTER — Ambulatory Visit (INDEPENDENT_AMBULATORY_CARE_PROVIDER_SITE_OTHER): Payer: PPO | Admitting: Cardiovascular Disease

## 2018-11-06 VITALS — BP 110/70 | HR 69 | Ht 67.5 in | Wt 144.8 lb

## 2018-11-06 DIAGNOSIS — I10 Essential (primary) hypertension: Secondary | ICD-10-CM | POA: Diagnosis not present

## 2018-11-06 DIAGNOSIS — R002 Palpitations: Secondary | ICD-10-CM

## 2018-11-06 DIAGNOSIS — I471 Supraventricular tachycardia: Secondary | ICD-10-CM | POA: Diagnosis not present

## 2018-11-06 DIAGNOSIS — C50919 Malignant neoplasm of unspecified site of unspecified female breast: Secondary | ICD-10-CM

## 2018-11-06 NOTE — Patient Instructions (Addendum)
Medication Instructions:  No changes  If you need a refill on your cardiac medications before your next appointment, please call your pharmacy.    Lab work: No new labs needed   If you have labs (blood work) drawn today and your tests are completely normal, you will receive your results only by: Marland Kitchen MyChart Message (if you have MyChart) OR . A paper copy in the mail If you have any lab test that is abnormal or we need to change your treatment, we will call you to review the results.   Testing/Procedures: We will order a CT coronary calcium score Radiation from breast cancer, father with PAD/CVAs   Follow-Up: At [facility], you and your health needs are our priority.  As part of our continuing mission to provide you with exceptional heart care, we have created designated Provider Care Teams.  These Care Teams include your primary Cardiologist (physician) and Advanced Practice Providers (APPs -  Physician Assistants and Nurse Practitioners) who all work together to provide you with the care you need, when you need it.  . You will need a follow up appointment as needed  . Providers on your designated Care Team:   . Murray Hodgkins, NP . Christell Faith, PA-C . Marrianne Mood, PA-C  Any Other Special Instructions Will Be Listed Below (If Applicable).  For educational health videos Log in to : [email or web address] Or : [email or web address], password : [email or web address]

## 2018-11-06 NOTE — Addendum Note (Signed)
Addended by: Valora Corporal on: 11/06/2018 09:26 AM   Modules accepted: Orders

## 2018-12-03 ENCOUNTER — Ambulatory Visit (INDEPENDENT_AMBULATORY_CARE_PROVIDER_SITE_OTHER)
Admission: RE | Admit: 2018-12-03 | Discharge: 2018-12-03 | Disposition: A | Discharge status: Home / Self Care | Payer: Self-pay | Source: Ambulatory Visit | Attending: Cardiovascular Disease | Admitting: Cardiovascular Disease

## 2018-12-03 DIAGNOSIS — I471 Supraventricular tachycardia: Secondary | ICD-10-CM

## 2018-12-03 DIAGNOSIS — R002 Palpitations: Secondary | ICD-10-CM

## 2019-01-07 ENCOUNTER — Other Ambulatory Visit: Payer: Self-pay

## 2019-01-07 ENCOUNTER — Telehealth: Payer: Self-pay

## 2019-01-07 ENCOUNTER — Inpatient Hospital Stay: Payer: PPO | Attending: Hematology and Oncology

## 2019-01-07 DIAGNOSIS — M8580 Other specified disorders of bone density and structure, unspecified site: Secondary | ICD-10-CM | POA: Insufficient documentation

## 2019-01-07 DIAGNOSIS — Z79810 Long term (current) use of selective estrogen receptor modulators (SERMs): Secondary | ICD-10-CM | POA: Diagnosis not present

## 2019-01-07 DIAGNOSIS — Z170 Estrogen receptor positive status [ER+]: Secondary | ICD-10-CM | POA: Diagnosis not present

## 2019-01-07 DIAGNOSIS — D0512 Intraductal carcinoma in situ of left breast: Secondary | ICD-10-CM | POA: Insufficient documentation

## 2019-01-07 DIAGNOSIS — C50919 Malignant neoplasm of unspecified site of unspecified female breast: Secondary | ICD-10-CM

## 2019-01-07 DIAGNOSIS — Z79899 Other long term (current) drug therapy: Secondary | ICD-10-CM | POA: Diagnosis not present

## 2019-01-07 DIAGNOSIS — M85852 Other specified disorders of bone density and structure, left thigh: Secondary | ICD-10-CM

## 2019-01-07 LAB — CBC WITH DIFFERENTIAL/PLATELET
Abs Immature Granulocytes: 0.01 10*3/uL (ref 0.00–0.07)
Basophils Absolute: 0.1 10*3/uL (ref 0.0–0.1)
Basophils Relative: 1 %
Eosinophils Absolute: 0.1 10*3/uL (ref 0.0–0.5)
Eosinophils Relative: 2 %
HCT: 43.3 % (ref 36.0–46.0)
Hemoglobin: 14.1 g/dL (ref 12.0–15.0)
Immature Granulocytes: 0 %
Lymphocytes Relative: 21 %
Lymphs Abs: 1.3 10*3/uL (ref 0.7–4.0)
MCH: 29.6 pg (ref 26.0–34.0)
MCHC: 32.6 g/dL (ref 30.0–36.0)
MCV: 90.8 fL (ref 80.0–100.0)
Monocytes Absolute: 0.6 10*3/uL (ref 0.1–1.0)
Monocytes Relative: 9 %
Neutro Abs: 4 10*3/uL (ref 1.7–7.7)
Neutrophils Relative %: 67 %
Platelets: 243 10*3/uL (ref 150–400)
RBC: 4.77 MIL/uL (ref 3.87–5.11)
RDW: 13.5 % (ref 11.5–15.5)
WBC: 6 10*3/uL (ref 4.0–10.5)
nRBC: 0 % (ref 0.0–0.2)

## 2019-01-07 LAB — COMPREHENSIVE METABOLIC PANEL
ALT: 16 U/L (ref 0–44)
AST: 22 U/L (ref 15–41)
Albumin: 4.1 g/dL (ref 3.5–5.0)
Alkaline Phosphatase: 41 U/L (ref 38–126)
Anion gap: 8 (ref 5–15)
BUN: 21 mg/dL (ref 8–23)
CO2: 29 mmol/L (ref 22–32)
Calcium: 9.3 mg/dL (ref 8.9–10.3)
Chloride: 104 mmol/L (ref 98–111)
Creatinine, Ser: 0.77 mg/dL (ref 0.44–1.00)
GFR calc Af Amer: >60 mL/min (ref >60)
GFR calc non Af Amer: >60 mL/min (ref >60)
Glucose, Bld: 118 mg/dL — ABNORMAL HIGH (ref 70–99)
Potassium: 5.2 mmol/L — ABNORMAL HIGH (ref 3.5–5.1)
Sodium: 141 mmol/L (ref 135–145)
Total Bilirubin: 1 mg/dL (ref 0.3–1.2)
Total Protein: 6.7 g/dL (ref 6.5–8.1)

## 2019-01-07 NOTE — Telephone Encounter (Signed)
contact the patient to inform her that she need to come in to repeat labs the patient was agreeable to come in on 01/08/19. The patient was understanding and agreeable to come in and get repeat labs.

## 2019-01-08 ENCOUNTER — Inpatient Hospital Stay: Payer: PPO

## 2019-01-08 DIAGNOSIS — D0512 Intraductal carcinoma in situ of left breast: Secondary | ICD-10-CM | POA: Diagnosis not present

## 2019-01-08 DIAGNOSIS — C50919 Malignant neoplasm of unspecified site of unspecified female breast: Secondary | ICD-10-CM

## 2019-01-08 LAB — COMPREHENSIVE METABOLIC PANEL
ALT: 16 U/L (ref 0–44)
AST: 23 U/L (ref 15–41)
Albumin: 4 g/dL (ref 3.5–5.0)
Alkaline Phosphatase: 45 U/L (ref 38–126)
Anion gap: 6 (ref 5–15)
BUN: 16 mg/dL (ref 8–23)
CO2: 30 mmol/L (ref 22–32)
Calcium: 9.2 mg/dL (ref 8.9–10.3)
Chloride: 107 mmol/L (ref 98–111)
Creatinine, Ser: 0.73 mg/dL (ref 0.44–1.00)
GFR calc Af Amer: >60 mL/min (ref >60)
GFR calc non Af Amer: >60 mL/min (ref >60)
Glucose, Bld: 102 mg/dL — ABNORMAL HIGH (ref 70–99)
Potassium: 4.5 mmol/L (ref 3.5–5.1)
Sodium: 143 mmol/L (ref 135–145)
Total Bilirubin: 1.1 mg/dL (ref 0.3–1.2)
Total Protein: 6.7 g/dL (ref 6.5–8.1)

## 2019-03-22 ENCOUNTER — Ambulatory Visit: Payer: PPO | Admitting: Radiation Oncology

## 2019-04-01 ENCOUNTER — Other Ambulatory Visit: Payer: Self-pay

## 2019-04-01 DIAGNOSIS — D0512 Intraductal carcinoma in situ of left breast: Secondary | ICD-10-CM

## 2019-04-08 ENCOUNTER — Other Ambulatory Visit: Payer: PPO

## 2019-04-08 ENCOUNTER — Ambulatory Visit: Payer: PPO | Admitting: Hematology and Oncology

## 2019-04-11 ENCOUNTER — Other Ambulatory Visit: Payer: Self-pay | Admitting: Cardiovascular Disease

## 2019-04-18 ENCOUNTER — Encounter: Payer: Self-pay | Admitting: *Deleted

## 2019-04-29 ENCOUNTER — Ambulatory Visit: Payer: PPO | Admitting: Radiation Oncology

## 2019-05-06 ENCOUNTER — Ambulatory Visit: Payer: PPO | Admitting: Hematology and Oncology

## 2019-05-06 ENCOUNTER — Other Ambulatory Visit: Payer: PPO

## 2019-05-29 ENCOUNTER — Inpatient Hospital Stay: Payer: PPO | Attending: Hematology and Oncology

## 2019-05-29 ENCOUNTER — Other Ambulatory Visit: Payer: Self-pay

## 2019-05-29 DIAGNOSIS — M8580 Other specified disorders of bone density and structure, unspecified site: Secondary | ICD-10-CM | POA: Diagnosis not present

## 2019-05-29 DIAGNOSIS — C50919 Malignant neoplasm of unspecified site of unspecified female breast: Secondary | ICD-10-CM

## 2019-05-29 DIAGNOSIS — E875 Hyperkalemia: Secondary | ICD-10-CM | POA: Insufficient documentation

## 2019-05-29 DIAGNOSIS — Z170 Estrogen receptor positive status [ER+]: Secondary | ICD-10-CM | POA: Diagnosis not present

## 2019-05-29 DIAGNOSIS — Z79899 Other long term (current) drug therapy: Secondary | ICD-10-CM | POA: Diagnosis not present

## 2019-05-29 DIAGNOSIS — Z7951 Long term (current) use of inhaled steroids: Secondary | ICD-10-CM | POA: Diagnosis not present

## 2019-05-29 DIAGNOSIS — Z803 Family history of malignant neoplasm of breast: Secondary | ICD-10-CM | POA: Insufficient documentation

## 2019-05-29 DIAGNOSIS — D0512 Intraductal carcinoma in situ of left breast: Secondary | ICD-10-CM

## 2019-05-29 DIAGNOSIS — I10 Essential (primary) hypertension: Secondary | ICD-10-CM | POA: Insufficient documentation

## 2019-05-29 DIAGNOSIS — Z79810 Long term (current) use of selective estrogen receptor modulators (SERMs): Secondary | ICD-10-CM | POA: Insufficient documentation

## 2019-05-29 LAB — COMPREHENSIVE METABOLIC PANEL
ALT: 17 U/L (ref 0–44)
AST: 20 U/L (ref 15–41)
Albumin: 3.7 g/dL (ref 3.5–5.0)
Alkaline Phosphatase: 38 U/L (ref 38–126)
Anion gap: 5 (ref 5–15)
BUN: 17 mg/dL (ref 8–23)
CO2: 27 mmol/L (ref 22–32)
Calcium: 8.7 mg/dL — ABNORMAL LOW (ref 8.9–10.3)
Chloride: 106 mmol/L (ref 98–111)
Creatinine, Ser: 0.83 mg/dL (ref 0.44–1.00)
GFR calc Af Amer: >60 mL/min (ref >60)
GFR calc non Af Amer: >60 mL/min (ref >60)
Glucose, Bld: 96 mg/dL (ref 70–99)
Potassium: 4.7 mmol/L (ref 3.5–5.1)
Sodium: 138 mmol/L (ref 135–145)
Total Bilirubin: 1.2 mg/dL (ref 0.3–1.2)
Total Protein: 6.3 g/dL — ABNORMAL LOW (ref 6.5–8.1)

## 2019-05-29 LAB — CBC WITH DIFFERENTIAL/PLATELET
Abs Immature Granulocytes: 0 10*3/uL (ref 0.00–0.07)
Basophils Absolute: 0.1 10*3/uL (ref 0.0–0.1)
Basophils Relative: 2 %
Eosinophils Absolute: 0.2 10*3/uL (ref 0.0–0.5)
Eosinophils Relative: 4 %
HCT: 41.8 % (ref 36.0–46.0)
Hemoglobin: 13.9 g/dL (ref 12.0–15.0)
Immature Granulocytes: 0 %
Lymphocytes Relative: 29 %
Lymphs Abs: 1.1 10*3/uL (ref 0.7–4.0)
MCH: 29.9 pg (ref 26.0–34.0)
MCHC: 33.3 g/dL (ref 30.0–36.0)
MCV: 89.9 fL (ref 80.0–100.0)
Monocytes Absolute: 0.5 10*3/uL (ref 0.1–1.0)
Monocytes Relative: 14 %
Neutro Abs: 2 10*3/uL (ref 1.7–7.7)
Neutrophils Relative %: 51 %
Platelets: 218 10*3/uL (ref 150–400)
RBC: 4.65 MIL/uL (ref 3.87–5.11)
RDW: 13.1 % (ref 11.5–15.5)
WBC: 3.9 10*3/uL — ABNORMAL LOW (ref 4.0–10.5)
nRBC: 0 % (ref 0.0–0.2)

## 2019-05-29 NOTE — Progress Notes (Signed)
[facility]  [address], [address] [place], [place] [address] Phone: [phone]  Fax: [phone]   Telemedicine Office Visit:  06/03/2019  Referring physician: Lucille Passy, MD  I connected with Susan [name] on 06/03/2019 at 10:22 AM by videoconferencing and verified that I was speaking with the correct person using 2 identifiers.  The patient was at home.  I discussed the limitations, risk, security and privacy concerns of performing an evaluation and management service by videoconferencing and the availability of in person appointments.  I also discussed with the patient that there may be a patient responsible charge related to this service.  The patient expressed understanding and agreed to proceed.   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  HPI: The patient was last seen in the oncology clinic on 10/05/2018. At that time, she denied any new complaints.  She noted no change in hot flashes/night sweats.  She was tolerating tamoxifen well.  She had a follow-up by Dr. Baruch Gouty on 10/05/2018. Exam revealed no evidence of disease recurrence.  She is scheduled for follow-up on 06/14/2019.   She met with her PCP, Wilfred Lacy, NP on 10/05/2018 for hyperkalemia.   She saw her cardiologist, Dr. Ida Rogue, on 11/06/2018 for routine follow-up. Hypertension, palpitations, and tachycardia were stable. She continued on metoprolol.   Labs followed: 01/07/2019: WBC 6,000, hemoglobin 14.1, hematocrit 43.3, platelets 243,000. Potassium 5.2.  01/08/2019:  Potassium 4.5.  05/29/2019: WBC 3,900 (ANC 2,000), hemoglobin 13.9, hematocrit 41.8, platelets 218,000. Potassium 4.7.  Calcium 8.7 (corrected 8.99).   During the interim, she reports her left nipple is flattened over, and she uses extra care in washing it. She denies any other breast concerns. Her last breast exam was on 10/05/2018.  She denies any recent illnesses.  She recently started a B12 supplement to help with nocturnal leg cramps. She continues on tamoxifen and is tolerating it well.    Past Medical History:  Diagnosis Date  . Arthritis   . Breast cancer ([place])    Left DCIS  . Cancer ([place])   . Complication of anesthesia   . Dysrhythmia   . Family history of breast cancer   . Gastric reflux 90's  . GERD (gastroesophageal reflux disease)   . Headache    H/O MIGRAINES  . Hiatal hernia 2003  . Hypertension   . Mitral valve regurgitation   . PONV (postoperative nausea and vomiting)    N/V  . SVT (supraventricular tachycardia) (HCC)     Past Surgical History:  Procedure Laterality Date  . APPENDECTOMY    . BREAST BIOPSY Left 06/13/2018   Affirm Bx- coil clip   atypical sclerosing and DCIS  . BREAST EXCISIONAL BIOPSY Right 2000   benign  . BREAST LUMPECTOMY Left 06/26/2018   atypical sclerosing lesion and DCIS bracketing  . BREAST LUMPECTOMY WITH NEEDLE LOCALIZATION Left 06/26/2018   Procedure: BREAST LUMPECTOMY WITH NEEDLE LOCALIZATION;  Surgeon: Jules Husbands, MD;  Location: [facility] ORS;  Service: General;  Laterality: Left;  . CHOLECYSTECTOMY    . OVARIAN CYST SURGERY    . TONSILLECTOMY     age 41  . UPPER GI ENDOSCOPY  2003    Family History  Problem Relation Age of Onset  . Hyperlipidemia Mother   . Hypertension Mother   . COPD Mother   . Heart failure Mother        deceased 18  .  Stroke Father        deceased 13  . Hypertension Father   . Hypertension Sister   . Hypertension Brother   . Healthy Brother   . Breast cancer Cousin 29       daughter of paternal aunt; currently 16  . Breast cancer Cousin 25       daughter of paternal uncle; currently 79  . Breast cancer Cousin 29       daughter of maternal uncle; deceased 24  . Testicular cancer Other 90       sister's son; currently 12  . Melanoma Paternal Uncle   . Breast cancer Cousin 3       daughter of same maternal uncle;  currently 6    Social History:  reports that she has never smoked. She has never used smokeless tobacco. She reports current alcohol use of about 2.0 standard drinks of alcohol per week. She reports that she does not use drugs. She denies tobacco use. She drinks wine with dinner. Patient is a [place] native, however has spent the last several years in [place]. Patient is a retired Administrator, arts. Patient denies known exposures to radiation on toxins. She has a golden doodle name Tharon Aquas. The patient is alone today.  Participants in the patient's visit and their role in the encounter included the patient and Waymon Budge, RN today.  The intake visit was provided by Waymon Budge, RN.  Allergies: No Known Allergies  Current Medications: Current Outpatient Medications  Medication Sig Dispense Refill  . ALPRAZolam (XANAX) 0.5 MG tablet Take 1 tablet (0.5 mg total) by mouth at bedtime as needed. for sleep 30 tablet 1  . calcium carbonate (TUMS EX) 750 MG chewable tablet Chew 750 mg by mouth as needed for heartburn.     . calcium citrate-vitamin D 500-500 MG-UNIT chewable tablet Chew 1 tablet by mouth 2 (two) times daily.    . cholecalciferol (VITAMIN D) 1000 units tablet Take 1,000 Units by mouth daily.    . Cyanocobalamin (B-12) 1000 MCG CAPS Take 1 tablet by mouth daily.     . famotidine (PEPCID) 20 MG tablet Take 20 mg by mouth as needed for heartburn or indigestion.    . fluticasone (FLONASE) 50 MCG/ACT nasal spray Place 2 sprays into both nostrils as needed.     . metoprolol succinate (TOPROL-XL) 25 MG 24 hr tablet TAKE ONE HALF TAB BY MOUTH EVERY MORNING 45 tablet 0  . naproxen sodium (ANAPROX) 220 MG tablet Take 220 mg by mouth daily as needed (pain).     . psyllium (TGT PSYLLIUM FIBER) 0.52 g capsule Take 0.52 g by mouth 3 (three) times daily.     Marland Kitchen SIMETHICONE-80 PO Take 80 mg by mouth as needed (gas).     . tamoxifen (NOLVADEX) 20 MG tablet Take 1 tablet (20 mg total) by mouth  daily. 90 tablet 3   No current facility-administered medications for this visit.     Review of Systems  Constitutional: Negative for chills, diaphoresis, fever, malaise/fatigue and weight loss.  HENT: Negative for congestion, hearing loss, nosebleeds and sore throat.   Eyes: Negative for blurred vision.  Respiratory: Negative for cough, sputum production, shortness of breath and wheezing.   Cardiovascular: Negative for chest pain, palpitations, claudication, leg swelling and PND.  Gastrointestinal: Negative for abdominal pain, blood in stool, constipation, diarrhea, heartburn, melena, nausea and vomiting.  Genitourinary: Negative for dysuria, frequency and urgency.  Musculoskeletal: Positive for myalgias (nocturnal leg cramps). Negative  for back pain and joint pain.  Skin: Negative for rash.  Neurological: Negative for dizziness, tingling, sensory change, weakness and headaches.  Endo/Heme/Allergies: Does not bruise/bleed easily.  Psychiatric/Behavioral: Negative for depression and memory loss. The patient is not nervous/anxious and does not have insomnia.   All other systems reviewed and are negative.   Performance status (ECOG): 0  Physical Exam  Constitutional: She is oriented to person, place, and time. She appears well-developed and well-nourished. No distress.  HENT:  Short gray hair.    Eyes: Pupils are equal, round, and reactive to light. Conjunctivae and EOM are normal. No scleral icterus.  Glasses.  Brown eyes.  Neurological: She is alert and oriented to person, place, and time.  Skin: She is not diaphoretic.  Psychiatric: She has a normal mood and affect. Her behavior is normal. Judgment and thought content normal.  Nursing note reviewed.   No visits with results within 3 Day(s) from this visit.  Latest known visit with results is:  Orders Only on 05/29/2019  Component Date Value Ref Range Status  . WBC 05/29/2019 3.9* 4.0 - 10.5 K/uL Final  . RBC 05/29/2019 4.65   3.87 - 5.11 MIL/uL Final  . Hemoglobin 05/29/2019 13.9  12.0 - 15.0 g/dL Final  . HCT 05/29/2019 41.8  36.0 - 46.0 % Final  . MCV 05/29/2019 89.9  80.0 - 100.0 fL Final  . MCH 05/29/2019 29.9  26.0 - 34.0 pg Final  . MCHC 05/29/2019 33.3  30.0 - 36.0 g/dL Final  . RDW 05/29/2019 13.1  11.5 - 15.5 % Final  . Platelets 05/29/2019 218  150 - 400 K/uL Final  . nRBC 05/29/2019 0.0  0.0 - 0.2 % Final  . Neutrophils Relative % 05/29/2019 51  % Final  . Neutro Abs 05/29/2019 2.0  1.7 - 7.7 K/uL Final  . Lymphocytes Relative 05/29/2019 29  % Final  . Lymphs Abs 05/29/2019 1.1  0.7 - 4.0 K/uL Final  . Monocytes Relative 05/29/2019 14  % Final  . Monocytes Absolute 05/29/2019 0.5  0.1 - 1.0 K/uL Final  . Eosinophils Relative 05/29/2019 4  % Final  . Eosinophils Absolute 05/29/2019 0.2  0.0 - 0.5 K/uL Final  . Basophils Relative 05/29/2019 2  % Final  . Basophils Absolute 05/29/2019 0.1  0.0 - 0.1 K/uL Final  . Immature Granulocytes 05/29/2019 0  % Final  . Abs Immature Granulocytes 05/29/2019 0.00  0.00 - 0.07 K/uL Final   Performed at [facility], [address], [place], [place] [address]  . Sodium 05/29/2019 138  135 - 145 mmol/L Final  . Potassium 05/29/2019 4.7  3.5 - 5.1 mmol/L Final  . Chloride 05/29/2019 106  98 - 111 mmol/L Final  . CO2 05/29/2019 27  22 - 32 mmol/L Final  . Glucose, Bld 05/29/2019 96  70 - 99 mg/dL Final  . BUN 05/29/2019 17  8 - 23 mg/dL Final  . Creatinine, Ser 05/29/2019 0.83  0.44 - 1.00 mg/dL Final  . Calcium 05/29/2019 8.7* 8.9 - 10.3 mg/dL Final  . Total Protein 05/29/2019 6.3* 6.5 - 8.1 g/dL Final  . Albumin 05/29/2019 3.7  3.5 - 5.0 g/dL Final  . AST 05/29/2019 20  15 - 41 U/L Final  . ALT 05/29/2019 17  0 - 44 U/L Final  . Alkaline Phosphatase 05/29/2019 38  38 - 126 U/L Final  . Total Bilirubin 05/29/2019 1.2  0.3 - 1.2 mg/dL Final  . GFR calc non Af Amer 05/29/2019 >  60  >60 mL/min Final  . GFR calc Af Amer 05/29/2019 >60  >60 mL/min Final  .  Anion gap 05/29/2019 5  5 - 15 Final   Performed at [facility], [address] [place], [place] [address]    Assessment:  Susan [name] is a 68 y.o. 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  Pathology revealed at least 28 mm grade 2-3 DCIS with sclerosing adenosis, cystic papillary apocrine metaplasia.  There was central expansive comedonecrosis.  Margins were negative (closest 1.7 mm).  DCIS was ER positive (>90%) and PR negative (< 1%).  Pathologic stage was pTis pNx.  Diagnostic left mammogram on 06/06/2018 revealed indeterminate microcalcifications over the inner midportion of the left breast.  There were 2 adjacent groups of heterogeneous microcalcifications with some linear forms spanning 1.1 x 2.0 x 2.4 cm.   She received radiation (07/30/2018 - 08/20/2018) followed by electron beam treatment (08/21/2018 - 08/31/2018).  She began tamoxifen on 09/05/2018.  Bone density on 07/26/2018 revealed osteopenia with a T-score of -1.8 in the left femoral neck.  Symptomatically, she denies any concerns.  She notes that her left nipple is still "flattened over".  She denies any nipple discharge, breast nodularity or erythema.  Plan: 1. Review labs from 05/29/2019. 2. Left breast DCIS Clinically, she is doing well.  Bilateral diagnostic mammogram 06/07/2019. Continue tamoxifen. 3. Osteopenia Continue calcium and vitamin D. 4.   RN- patient to call back about upcoming appt with Dr Baruch Gouty. If appt is virtual, she will need a f/u breast exam in 1 month. 5.   RTC in 6 months for MD assessment, labs (CBC with diff, CMP), and review of mammogram.  I discussed the assessment and treatment plan with the patient.  The patient was provided an opportunity to ask questions and all were answered.  The patient agreed with the plan and demonstrated an understanding of the instructions.  The patient was advised to call back or seek an in  person evaluation if the symptoms worsen or if the condition fails to improve as anticipated.  I provided 11 minutes (10:22 AM - 10:33 AM) of face-to-face video visit time during this this encounter and > 50% was spent counseling as documented under my assessment and plan.  I provided these services from the [facility] office.   Nolon Stalls, MD, PhD  06/03/2019, 10:22 AM  I, Molly Dorshimer, am acting as Education administrator for [employer]. Mike Gip, MD, PhD.  I, Melissa C. Mike Gip, MD, have reviewed the above documentation for accuracy and completeness, and I agree with the above.

## 2019-05-31 ENCOUNTER — Telehealth: Payer: Self-pay | Admitting: Hematology and Oncology

## 2019-06-03 ENCOUNTER — Inpatient Hospital Stay (HOSPITAL_BASED_OUTPATIENT_CLINIC_OR_DEPARTMENT_OTHER): Payer: PPO | Admitting: Hematology and Oncology

## 2019-06-03 ENCOUNTER — Other Ambulatory Visit: Payer: PPO

## 2019-06-03 ENCOUNTER — Encounter: Payer: Self-pay | Admitting: Hematology and Oncology

## 2019-06-03 DIAGNOSIS — M85852 Other specified disorders of bone density and structure, left thigh: Secondary | ICD-10-CM | POA: Diagnosis not present

## 2019-06-03 DIAGNOSIS — D0512 Intraductal carcinoma in situ of left breast: Secondary | ICD-10-CM

## 2019-06-03 NOTE — Progress Notes (Signed)
Confirmed Name, DOB, and Address. Patient would like to discuss scheduling a mammogram. Denies any concerns.

## 2019-06-11 NOTE — Progress Notes (Signed)
Subjective:   Susan [name] is a 68 y.o. female who presents for Medicare Annual (Subsequent) preventive examination.  Pt has new goldendoodle named Frankie. He is 2. She loves him and walks him 2 miles daily.  Review of Systems: No ROS.   See social history for additional risk factors.    Sleep patterns: no issues Home Safety/Smoke Alarms: Feels safe in home. Smoke alarms in place.  Lives in 2 story home with husband.   Female:      Mammo- scheduled 06/14/19      Dexa scan-07/26/18        CCS- Cologuard 06/15/17- neg     Objective:     Vitals: BP 112/68 (BP Location: Left Arm, Patient Position: Sitting, Cuff Size: Normal)   Pulse 64   Wt 148 lb (67.1 kg)   SpO2 98%   BMI 22.84 kg/m   Body mass index is 22.84 kg/m.  Advanced Directives 06/12/2019 06/03/2019 10/05/2018 09/05/2018 07/11/2018 06/25/2018 06/22/2018  Does Patient Have a Medical Advance Directive? Yes Yes Yes Yes Yes Yes Yes  Type of Paramedic of [place];Living will [place];Living will - [place];Living will [place];Living will [place];Living will -  Does patient want to make changes to medical advance directive? No - Patient declined - - - - - -  Copy of [place] in Chart? Yes - validated most recent copy scanned in chart (See row information) Yes - validated most recent copy scanned in chart (See row information) - Yes Yes No - copy requested -    Tobacco Social History   Tobacco Use  Smoking Status Never Smoker  Smokeless Tobacco Never Used     Counseling given: Not Answered   Clinical Intake:     Pain : No/denies pain                 Past Medical History:  Diagnosis Date  . Arthritis   . 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 Cancer ([place])   . Complication of anesthesia   . Dysrhythmia   . Family history of breast cancer   . Gastric reflux 90's  . GERD  (gastroesophageal reflux disease)   . Headache    H/O MIGRAINES  . Hiatal hernia 2003  . Hypertension   . Mitral valve regurgitation   . PONV (postoperative nausea and vomiting)    N/V  . SVT (supraventricular tachycardia) ([facility])    Past Surgical History:  Procedure Laterality Date  . APPENDECTOMY    . BREAST BIOPSY Left 06/13/2018   Affirm Bx- coil clip   atypical sclerosing and DCIS  . BREAST EXCISIONAL BIOPSY Right 2000   benign  . BREAST LUMPECTOMY Left 06/26/2018   atypical sclerosing lesion and DCIS bracketing  . BREAST LUMPECTOMY WITH NEEDLE LOCALIZATION Left 06/26/2018   Procedure: BREAST LUMPECTOMY WITH NEEDLE LOCALIZATION;  Surgeon: Jules Husbands, MD;  Location: [facility] ORS;  Service: General;  Laterality: Left;  . CHOLECYSTECTOMY    . OVARIAN CYST SURGERY    . TONSILLECTOMY     age 13  . UPPER GI ENDOSCOPY  2003   Family History  Problem Relation Age of Onset  . Hyperlipidemia Mother   . Hypertension Mother   . COPD Mother   . Heart failure Mother        deceased 25  . Stroke Father        deceased 35  .  Hypertension Father   . Hypertension Sister   . Hypertension Brother   . Healthy Brother   . Breast cancer Cousin 6       daughter of paternal aunt; currently 9  . Breast cancer Cousin 58       daughter of paternal uncle; currently 27  . Breast cancer Cousin 67       daughter of maternal uncle; deceased 9  . Testicular cancer Other 13       sister's son; currently 105  . Melanoma Paternal Uncle   . Breast cancer Cousin 31       daughter of same maternal uncle; currently 88   Social History   Socioeconomic History  . Marital status: Married    Spouse name: Not on file  . Number of children: Not on file  . Years of education: Not on file  . Highest education level: Not on file  Occupational History  . Not on file  Social Needs  . Financial resource strain: Not on file  . Food insecurity    Worry: Not on file    Inability: Not on file  .  Transportation needs    Medical: Not on file    Non-medical: Not on file  Tobacco Use  . Smoking status: Never Smoker  . Smokeless tobacco: Never Used  Substance and Sexual Activity  . Alcohol use: Yes    Alcohol/week: 2.0 standard drinks    Types: 2 Glasses of wine per week    Comment: daily WINE  . Drug use: No  . Sexual activity: Not Currently  Lifestyle  . Physical activity    Days per week: Not on file    Minutes per session: Not on file  . Stress: Not on file  Relationships  . Social Herbalist on phone: Not on file    Gets together: Not on file    Attends religious service: Not on file    Active member of club or organization: Not on file    Attends meetings of clubs or organizations: Not on file    Relationship status: Not on file  Other Topics Concern  . Not on file  Social History Narrative   She does have a living will.   Desires CPR    Outpatient Encounter Medications as of 06/12/2019  Medication Sig  . ALPRAZolam (XANAX) 0.5 MG tablet Take 1 tablet (0.5 mg total) by mouth at bedtime as needed. for sleep  . calcium citrate-vitamin D 500-500 MG-UNIT chewable tablet Chew 1 tablet by mouth 2 (two) times daily.  . cholecalciferol (VITAMIN D) 1000 units tablet Take 1,000 Units by mouth daily.  . Cyanocobalamin (B-12) 1000 MCG CAPS Take 1 tablet by mouth daily.   . famotidine (PEPCID) 20 MG tablet Take 20 mg by mouth as needed for heartburn or indigestion.  . fluticasone (FLONASE) 50 MCG/ACT nasal spray Place 2 sprays into both nostrils as needed.   . metoprolol succinate (TOPROL-XL) 25 MG 24 hr tablet TAKE ONE HALF TAB BY MOUTH EVERY MORNING  . naproxen sodium (ANAPROX) 220 MG tablet Take 220 mg by mouth daily as needed (pain).   . psyllium (TGT PSYLLIUM FIBER) 0.52 g capsule Take 0.52 g by mouth 3 (three) times daily.   Marland Kitchen SIMETHICONE-80 PO Take 80 mg by mouth as needed (gas).   . tamoxifen (NOLVADEX) 20 MG tablet Take 1 tablet (20 mg total) by mouth  daily.  . [DISCONTINUED] calcium carbonate (TUMS EX) 750 MG  chewable tablet Chew 750 mg by mouth as needed for heartburn.    No facility-administered encounter medications on file as of 06/12/2019.     Activities of Daily Living In your present state of health, do you have any difficulty performing the following activities: 06/22/2018  Hearing? N  Vision? N  Difficulty concentrating or making decisions? N  Walking or climbing stairs? N  Dressing or bathing? N  Doing errands, shopping? N  Some recent data might be hidden    Patient Care Team: Lucille Passy, MD as PCP - General Lacinda Axon Tedra Coupe, MD as Referring Physician (Dermatology)    Assessment:   This is a routine wellness examination for [place]. Physical assessment deferred to PCP.  Exercise Activities and Dietary recommendations   Diet (meal preparation, eat out, water intake, caffeinated beverages, dairy products, fruits and vegetables): in general, a "healthy" diet  , well balanced, on average, 3 meals per day     Goals    . Increase physical activity     Starting 05/29/17, I will continue to exercise for 45-60 min daily.     . Maintain current healthy active lifestyle.       Fall Risk Fall Risk  06/12/2019 07/11/2018 05/30/2018 05/29/2017 01/25/2016  Falls in the past year? 0 No No No No    Depression Screen PHQ 2/9 Scores 06/12/2019 05/30/2018 05/29/2017 01/25/2016  PHQ - 2 Score 0 0 0 0     Cognitive Function Ad8 score reviewed for issues:  Issues making decisions:no  Less interest in hobbies / activities:no  Repeats questions, stories (family complaining):no  Trouble using ordinary gadgets (microwave, computer, phone):no  Forgets the month or year: no  Mismanaging finances: no  Remembering appts:no  Daily problems with thinking and/or memory:no Ad8 score is=0     MMSE - Mini Mental State Exam 05/29/2017  Orientation to time 5  Orientation to Place 5  Registration 3  Attention/ Calculation 0   Recall 3  Language- name 2 objects 0  Language- repeat 1  Language- follow 3 step command 3  Language- read & follow direction 0  Write a sentence 0  Copy design 0  Total score 20        Immunization History  Administered Date(s) Administered  . Influenza,inj,Quad PF,6+ Mos 10/02/2013, 09/23/2015, 09/16/2016, 10/03/2017  . Pneumococcal Conjugate-13 01/25/2016  . Pneumococcal Polysaccharide-23 05/29/2017  . Td 12/03/2008  . Zoster 09/29/2015   Screening Tests Health Maintenance  Topic Date Due  . Hepatitis C Screening  [birth year]  . TETANUS/TDAP  12/03/2018  . INFLUENZA VACCINE  07/27/2019  . MAMMOGRAM  06/06/2020  . COLONOSCOPY  06/16/2027  . DEXA SCAN  Completed  . PNA vac Low Risk Adult  Completed  . COLON CANCER SCREENING ANNUAL FOBT  Discontinued   Plan:   Pt c/o of right hip pain. Pain is worse with activity. Pt notices when she doesn't walk her 4 miles daily, she does not have the pain. Pt reports pain is controlled with Naproxen. Pt will discuss further with PCP 06/19/19.  See you next year!  Continue to eat heart healthy diet (full of fruits, vegetables, whole grains, lean protein, water--limit salt, fat, and sugar intake) and increase physical activity as tolerated.  Continue doing brain stimulating activities (puzzles, reading, adult coloring books, staying active) to keep memory sharp.     I have personally reviewed and noted the following in the patient's chart:   . Medical and social history . Use of alcohol, tobacco  or illicit drugs  . Current medications and supplements . Functional ability and status . Nutritional status . Physical activity . Advanced directives . List of other physicians . Hospitalizations, surgeries, and ER visits in previous 12 months . Vitals . Screenings to include cognitive, depression, and falls . Referrals and appointments  In addition, I have reviewed and discussed with patient certain preventive protocols, quality  metrics, and best practice recommendations. A written personalized care plan for preventive services as well as general preventive health recommendations were provided to patient.     Shela Nevin, [place]  06/12/2019

## 2019-06-12 ENCOUNTER — Other Ambulatory Visit: Payer: Self-pay

## 2019-06-12 ENCOUNTER — Ambulatory Visit (INDEPENDENT_AMBULATORY_CARE_PROVIDER_SITE_OTHER): Payer: PPO | Admitting: *Deleted

## 2019-06-12 ENCOUNTER — Encounter: Payer: Self-pay | Admitting: *Deleted

## 2019-06-12 VITALS — BP 112/68 | HR 64 | Ht 68.0 in | Wt 148.0 lb

## 2019-06-12 DIAGNOSIS — D0510 Intraductal carcinoma in situ of unspecified breast: Secondary | ICD-10-CM | POA: Diagnosis not present

## 2019-06-12 DIAGNOSIS — M85852 Other specified disorders of bone density and structure, left thigh: Secondary | ICD-10-CM

## 2019-06-12 LAB — COMPREHENSIVE METABOLIC PANEL
ALT: 17 U/L (ref 0–35)
AST: 18 U/L (ref 0–37)
Albumin: 4.3 g/dL (ref 3.5–5.2)
Alkaline Phosphatase: 38 U/L — ABNORMAL LOW (ref 39–117)
BUN: 15 mg/dL (ref 6–23)
CO2: 31 mEq/L (ref 19–32)
Calcium: 9.6 mg/dL (ref 8.4–10.5)
Chloride: 102 mEq/L (ref 96–112)
Creatinine, Ser: 0.82 mg/dL (ref 0.40–1.20)
GFR: 69.15 mL/min (ref >60.00)
Glucose, Bld: 95 mg/dL (ref 70–99)
Potassium: 4.7 mEq/L (ref 3.5–5.1)
Sodium: 140 mEq/L (ref 135–145)
Total Bilirubin: 1.1 mg/dL (ref 0.2–1.2)
Total Protein: 6.7 g/dL (ref 6.0–8.3)

## 2019-06-12 LAB — CBC WITH DIFFERENTIAL/PLATELET
Basophils Absolute: 0.1 10*3/uL (ref 0.0–0.1)
Basophils Relative: 1.5 % (ref 0.0–3.0)
Eosinophils Absolute: 0.1 10*3/uL (ref 0.0–0.7)
Eosinophils Relative: 3 % (ref 0.0–5.0)
HCT: 44.8 % (ref 36.0–46.0)
Hemoglobin: 15.1 g/dL — ABNORMAL HIGH (ref 12.0–15.0)
Lymphocytes Relative: 19.4 % (ref 12.0–46.0)
Lymphs Abs: 1 10*3/uL (ref 0.7–4.0)
MCHC: 33.7 g/dL (ref 30.0–36.0)
MCV: 91.2 fl (ref 78.0–100.0)
Monocytes Absolute: 0.5 10*3/uL (ref 0.1–1.0)
Monocytes Relative: 10.5 % (ref 3.0–12.0)
Neutro Abs: 3.2 10*3/uL (ref 1.4–7.7)
Neutrophils Relative %: 65.6 % (ref 43.0–77.0)
Platelets: 255 10*3/uL (ref 150.0–400.0)
RBC: 4.92 Mil/uL (ref 3.87–5.11)
RDW: 13.8 % (ref 11.5–15.5)
WBC: 4.9 10*3/uL (ref 4.0–10.5)

## 2019-06-12 NOTE — Progress Notes (Signed)
I reviewed health advisor's note, was available for consultation, and agree with documentation and plan.  

## 2019-06-12 NOTE — Patient Instructions (Addendum)
See you next year!  Continue to eat heart healthy diet (full of fruits, vegetables, whole grains, lean protein, water--limit salt, fat, and sugar intake) and increase physical activity as tolerated.  Continue doing brain stimulating activities (puzzles, reading, adult coloring books, staying active) to keep memory sharp.    Susan [name] , Thank you for taking time to come for your Medicare Wellness Visit. I appreciate your ongoing commitment to your health goals. Please review the following plan we discussed and let me know if I can assist you in the future.   These are the goals we discussed: Goals    . Increase physical activity     Starting 05/29/17, I will continue to exercise for 45-60 min daily.     . Maintain current healthy active lifestyle.       This is a list of the screening recommended for you and due dates:  Health Maintenance  Topic Date Due  .  Hepatitis C: One time screening is recommended by Center for Disease Control  (CDC) for  adults born from [birth year] through 1966.   November 07, 1951  . Tetanus Vaccine  12/03/2018  . Flu Shot  07/27/2019  . Mammogram  06/06/2020  . Colon Cancer Screening  06/16/2027  . DEXA scan (bone density measurement)  Completed  . Pneumonia vaccines  Completed  . Stool Blood Test  Discontinued    Health Maintenance After Age 39 After age 17, you are at a higher risk for certain long-term diseases and infections as well as injuries from falls. Falls are a major cause of broken bones and head injuries in people who are older than age 98. Getting regular preventive care can help to keep you healthy and well. Preventive care includes getting regular testing and making lifestyle changes as recommended by your health care provider. Talk with your health care provider about:  Which screenings and tests you should have. A screening is a test that checks for a disease when you have no symptoms.  A diet and exercise plan that is right for you. What should I  know about screenings and tests to prevent falls? Screening and testing are the best ways to find a health problem early. Early diagnosis and treatment give you the best chance of managing medical conditions that are common after age 34. Certain conditions and lifestyle choices may make you more likely to have a fall. Your health care provider may recommend:  Regular vision checks. Poor vision and conditions such as cataracts can make you more likely to have a fall. If you wear glasses, make sure to get your prescription updated if your vision changes.  Medicine review. Work with your health care provider to regularly review all of the medicines you are taking, including over-the-counter medicines. Ask your health care provider about any side effects that may make you more likely to have a fall. Tell your health care provider if any medicines that you take make you feel dizzy or sleepy.  Osteoporosis screening. Osteoporosis is a condition that causes the bones to get weaker. This can make the bones weak and cause them to break more easily.  Blood pressure screening. Blood pressure changes and medicines to control blood pressure can make you feel dizzy.  Strength and balance checks. Your health care provider may recommend certain tests to check your strength and balance while standing, walking, or changing positions.  Foot health exam. Foot pain and numbness, as well as not wearing proper footwear, can make you more likely  to have a fall.  Depression screening. You may be more likely to have a fall if you have a fear of falling, feel emotionally low, or feel unable to do activities that you used to do.  Alcohol use screening. Using too much alcohol can affect your balance and may make you more likely to have a fall. What actions can I take to lower my risk of falls? General instructions  Talk with your health care provider about your risks for falling. Tell your health care provider if: ? You  fall. Be sure to tell your health care provider about all falls, even ones that seem minor. ? You feel dizzy, sleepy, or off-balance.  Take over-the-counter and prescription medicines only as told by your health care provider. These include any supplements.  Eat a healthy diet and maintain a healthy weight. A healthy diet includes low-fat dairy products, low-fat (lean) meats, and fiber from whole grains, beans, and lots of fruits and vegetables. Home safety  Remove any tripping hazards, such as rugs, cords, and clutter.  Install safety equipment such as grab bars in bathrooms and safety rails on stairs.  Keep rooms and walkways well-lit. Activity   Follow a regular exercise program to stay fit. This will help you maintain your balance. Ask your health care provider what types of exercise are appropriate for you.  If you need a cane or walker, use it as recommended by your health care provider.  Wear supportive shoes that have nonskid soles. Lifestyle  Do not drink alcohol if your health care provider tells you not to drink.  If you drink alcohol, limit how much you have: ? 0-1 drink a day for women. ? 0-2 drinks a day for men.  Be aware of how much alcohol is in your drink. In the [place] one drink equals one typical bottle of beer (12 oz), one-half glass of wine (5 oz), or one shot of hard liquor (1 oz).  Do not use any products that contain nicotine or tobacco, such as cigarettes and e-cigarettes. If you need help quitting, ask your health care provider. Summary  Having a healthy lifestyle and getting preventive care can help to protect your health and wellness after age 38.  Screening and testing are the best way to find a health problem early and help you avoid having a fall. Early diagnosis and treatment give you the best chance for managing medical conditions that are more common for people who are older than age 58.  Falls are a major cause of broken bones and head  injuries in people who are older than age 14. Take precautions to prevent a fall at home.  Work with your health care provider to learn what changes you can make to improve your health and wellness and to prevent falls. This information is not intended to replace advice given to you by your health care provider. Make sure you discuss any questions you have with your health care provider. Document Released: 10/25/2017 Document Revised: 10/25/2017 Document Reviewed: 10/25/2017 Elsevier Interactive Patient Education  2019 [employer].

## 2019-06-13 ENCOUNTER — Other Ambulatory Visit: Payer: Self-pay

## 2019-06-14 ENCOUNTER — Other Ambulatory Visit: Payer: Self-pay

## 2019-06-14 ENCOUNTER — Ambulatory Visit
Admission: RE | Admit: 2019-06-14 | Discharge: 2019-06-14 | Disposition: A | Discharge status: Home / Self Care | Payer: PPO | Source: Ambulatory Visit | Attending: Hematology and Oncology | Admitting: Hematology and Oncology

## 2019-06-14 ENCOUNTER — Encounter: Payer: Self-pay | Admitting: Radiation Oncology

## 2019-06-14 ENCOUNTER — Ambulatory Visit
Admission: RE | Admit: 2019-06-14 | Discharge: 2019-06-14 | Disposition: A | Discharge status: Home / Self Care | Payer: PPO | Source: Ambulatory Visit | Attending: Radiation Oncology | Admitting: Radiation Oncology

## 2019-06-14 VITALS — BP 135/87 | HR 74 | Temp 98.2°F | Resp 16 | Wt 149.0 lb

## 2019-06-14 DIAGNOSIS — D0512 Intraductal carcinoma in situ of left breast: Secondary | ICD-10-CM | POA: Insufficient documentation

## 2019-06-14 DIAGNOSIS — Z923 Personal history of irradiation: Secondary | ICD-10-CM | POA: Diagnosis not present

## 2019-06-14 DIAGNOSIS — R928 Other abnormal and inconclusive findings on diagnostic imaging of breast: Secondary | ICD-10-CM | POA: Diagnosis not present

## 2019-06-14 DIAGNOSIS — Z853 Personal history of malignant neoplasm of breast: Secondary | ICD-10-CM | POA: Diagnosis not present

## 2019-06-14 DIAGNOSIS — Z170 Estrogen receptor positive status [ER+]: Secondary | ICD-10-CM | POA: Insufficient documentation

## 2019-06-14 NOTE — Progress Notes (Signed)
Radiation Oncology Follow up Note  Name: Susan [name]   Date:   06/14/2019 MRN:  [id number] DOB: [birth year]    This 68 y.o. 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  She specifically denies breast tenderness cough or bone pain.  She does have some retraction of the nipple secondary to her surgical incision which the patient is contemplating a plastic surgical opinion..  Patient is scheduled for mammograms today which I will review  COMPLICATIONS OF TREATMENT: none  FOLLOW UP COMPLIANCE: keeps appointments   PHYSICAL EXAM:  BP 135/87 (BP Location: Left Arm, Patient Position: Sitting)   Pulse 74   Temp 98.2 F (36.8 C) (Tympanic)   Resp 16   Wt 149 lb 0.5 oz (67.6 kg)   BMI 22.66 kg/m  Left breast has some retraction of the nipple towards her incision causing a deformity in the breast cosmetic result is fair.  No dominant mass or nodularity is noted in either breast in 2 positions examined.  No axillary or supraclavicular adenopathy is identified.  Well-developed well-nourished patient in NAD. HEENT reveals PERLA, EOMI, discs not visualized.  Oral cavity is clear. No oral mucosal lesions are identified. Neck is clear without evidence of cervical or supraclavicular adenopathy. Lungs are clear to A&P. Cardiac examination is essentially unremarkable with regular rate and rhythm without murmur rub or thrill. Abdomen is benign with no organomegaly or masses noted. Motor sensory and DTR levels are equal and symmetric in the upper and lower extremities. Cranial nerves II through XII are grossly intact. Proprioception is intact.  No peripheral adenopathy or edema is identified. No motor or sensory levels are noted. Crude visual fields are within normal range.  RADIOLOGY RESULTS: Mammograms are scheduled today which I will review  PLAN: Patient continues to do well with no evidence of disease she has mammograms today which I will review.  I have suggested possibly contacting plastic surgeon of her surgeon's choice for mammoplasty.  I have asked to see her back in 6 months for follow-up.  Patient knows to call with any concerns at any time.  I would like to take this opportunity to thank you for allowing me to participate in the care of your patient.Noreene Filbert, MD

## 2019-06-19 ENCOUNTER — Ambulatory Visit (INDEPENDENT_AMBULATORY_CARE_PROVIDER_SITE_OTHER): Payer: PPO | Admitting: Family Medicine

## 2019-06-19 ENCOUNTER — Encounter: Payer: Self-pay | Admitting: Family Medicine

## 2019-06-19 VITALS — BP 104/77 | Wt 147.0 lb

## 2019-06-19 DIAGNOSIS — M25551 Pain in right hip: Secondary | ICD-10-CM | POA: Diagnosis not present

## 2019-06-19 NOTE — Assessment & Plan Note (Signed)
>  25 minutes spent in face to face time with patient, >50% spent in counselling or coordination of care discussing her hip pain.  ? Trochanteric busitis vs progressive arthritis of hip.  Last hip xray in 2011 did show some degenerative changes.  ADvised to continue alleve with food 30 min prior to her walks and glucosamine. Xray scheduled Monday. The patient indicates understanding of these issues and agrees with the plan.

## 2019-06-19 NOTE — Progress Notes (Signed)
Virtual Visit via Video   Due to the COVID-19 pandemic, this visit was completed with telemedicine (audio/video) technology to reduce patient and provider exposure as well as to preserve personal protective equipment.   I connected with Susan [name] by a video enabled telemedicine application and verified that I am speaking with the correct person using two identifiers. Location patient: Home Location provider: [facility], Office Persons participating in the virtual visit: Susan [name], Arnette Norris, MD   I discussed the limitations of evaluation and management by telemedicine and the availability of in person appointments. The patient expressed understanding and agreed to proceed.  Care Team   Patient Care Team: Lucille Passy, MD as PCP - General Lacinda Axon Tedra Coupe, MD as Referring Physician (Dermatology)  Subjective:   HPI:   Right hip pain- intermittent for a about a year.  Getting progressively worse- now happening almost daily.  Does not radiate to groin.  Taking Glucosamine since 06/13/19.  She feels this is helping. Pain is currently a 5/10.  No known injury. Walking makes it worse- walks 4-5 miles per.  Sitting doesn't bother or her or when she is sleeping.  Has been taking alleve 1 OTC tablet prior to her walk which has helped.  Lab Results  Component Value Date   CREATININE 0.82 06/12/2019   Lab Results  Component Value Date   ALT 17 06/12/2019   AST 18 06/12/2019   ALKPHOS 38 (L) 06/12/2019   BILITOT 1.1 06/12/2019     No LE weakness or tingling.  Clinical Data: Right hip pain.  No injury.   RIGHT HIP - COMPLETE 2+ VIEW   Comparison: None   Findings: There are mild degenerative changes within the hips bilaterally.  SI joints are symmetric and unremarkable. No acute bony abnormality.  Specifically, no fracture, subluxation, or dislocation.  Soft tissues are intact.   IMPRESSION: No acute bony abnormality.  Review of Systems   Constitutional: Negative.   HENT: Negative.   Respiratory: Negative.   Gastrointestinal: Negative.   Genitourinary: Negative.   Musculoskeletal: Positive for joint pain.  Skin: Negative.   Neurological: Negative.   Endo/Heme/Allergies: Negative.   Psychiatric/Behavioral: Negative.   All other systems reviewed and are negative.    Patient Active Problem List   Diagnosis Date Noted  . Osteopenia of neck of left femur 09/05/2018  . Breast cancer ([place])   . Family history of breast cancer   . 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 Non-rheumatic mitral regurgitation 10/28/2017  . Atrial tachycardia ([place]) 10/28/2017  . Well woman exam 06/05/2017  . GERD (gastroesophageal reflux disease) 03/20/2017  . Palpitations 03/20/2017  . Insomnia 01/07/2016  . Essential hypertension 04/02/2014    Social History   Tobacco Use  . Smoking status: Never Smoker  . Smokeless tobacco: Never Used  Substance Use Topics  . Alcohol use: Yes    Alcohol/week: 2.0 standard drinks    Types: 2 Glasses of wine per week    Comment: daily WINE    Current Outpatient Medications:  .  ALPRAZolam (XANAX) 0.5 MG tablet, Take 1 tablet (0.5 mg total) by mouth at bedtime as needed. for sleep, Disp: 30 tablet, Rfl: 1 .  calcium citrate-vitamin D 500-500 MG-UNIT chewable tablet, Chew 1 tablet by mouth 2 (two) times daily., Disp: , Rfl:  .  cholecalciferol (VITAMIN D) 1000 units tablet, Take 1,000 Units by mouth daily., Disp: , Rfl:  .  Cyanocobalamin (B-12) 1000 MCG CAPS, Take 1  tablet by mouth daily. , Disp: , Rfl:  .  famotidine (PEPCID) 20 MG tablet, Take 20 mg by mouth as needed for heartburn or indigestion., Disp: , Rfl:  .  fluticasone (FLONASE) 50 MCG/ACT nasal spray, Place 2 sprays into both nostrils as needed. , Disp: , Rfl:  .  metoprolol succinate (TOPROL-XL) 25 MG 24 hr tablet, TAKE ONE HALF TAB BY MOUTH EVERY MORNING, Disp: 45 tablet, Rfl: 0 .  naproxen sodium (ANAPROX) 220 MG tablet,  Take 220 mg by mouth daily as needed (pain). , Disp: , Rfl:  .  psyllium (TGT PSYLLIUM FIBER) 0.52 g capsule, Take 0.52 g by mouth 3 (three) times daily. , Disp: , Rfl:  .  SIMETHICONE-80 PO, Take 80 mg by mouth as needed (gas). , Disp: , Rfl:  .  tamoxifen (NOLVADEX) 20 MG tablet, Take 1 tablet (20 mg total) by mouth daily., Disp: 90 tablet, Rfl: 3  No Known Allergies  Objective:  BP 104/77   Wt 147 lb (66.7 kg)   BMI 22.35 kg/m   VITALS: Per patient if applicable, see vitals. GENERAL: Alert, appears well and in no acute distress. HEENT: Atraumatic, conjunctiva clear, no obvious abnormalities on inspection of external nose and ears. NECK: Normal movements of the head and neck. CARDIOPULMONARY: No increased WOB. Speaking in clear sentences. I:E ratio WNL.  MS: Moves all visible extremities without noticeable abnormality. Not TTP over the trochanteric bursa on right hip but not quite sure she was palpating in the correct place.  Mild discomfort with hip extension, not flexion. PSYCH: Pleasant and cooperative, well-groomed. Speech normal rate and rhythm. Affect is appropriate. Insight and judgement are appropriate. Attention is focused, linear, and appropriate.  NEURO: CN grossly intact. Oriented as arrived to appointment on time with no prompting. Moves both UE equally.  SKIN: No obvious lesions, wounds, erythema, or cyanosis noted on face or hands.  Depression screen [facility] 2/9 06/12/2019 05/30/2018 05/29/2017  Decreased Interest 0 0 0  Down, Depressed, Hopeless 0 0 0  PHQ - 2 Score 0 0 0    Assessment and Plan:   There are no diagnoses linked to this encounter.  Marland Kitchen COVID-19 Education: The signs and symptoms of COVID-19 were discussed with the patient and how to seek care for testing if needed. The importance of social distancing was discussed today. . Reviewed expectations re: course of current medical issues. . Discussed self-management of symptoms. . Outlined signs and symptoms  indicating need for more acute intervention. . Patient verbalized understanding and all questions were answered. Marland Kitchen Health Maintenance issues including appropriate healthy diet, exercise, and smoking avoidance were discussed with patient. . See orders for this visit as documented in the electronic medical record.  Arnette Norris, MD  Records requested if needed. Time spent: 25 minutes, of which >50% was spent in obtaining information about her symptoms, reviewing her previous labs, evaluations, and treatments, counseling her about her condition (please see the discussed topics above), and developing a plan to further investigate it; she had a number of questions which I addressed.

## 2019-06-20 DIAGNOSIS — L821 Other seborrheic keratosis: Secondary | ICD-10-CM | POA: Diagnosis not present

## 2019-06-20 DIAGNOSIS — D692 Other nonthrombocytopenic purpura: Secondary | ICD-10-CM | POA: Diagnosis not present

## 2019-06-20 DIAGNOSIS — L719 Rosacea, unspecified: Secondary | ICD-10-CM | POA: Diagnosis not present

## 2019-06-20 DIAGNOSIS — L578 Other skin changes due to chronic exposure to nonionizing radiation: Secondary | ICD-10-CM | POA: Diagnosis not present

## 2019-06-20 DIAGNOSIS — L812 Freckles: Secondary | ICD-10-CM | POA: Diagnosis not present

## 2019-06-20 DIAGNOSIS — L570 Actinic keratosis: Secondary | ICD-10-CM | POA: Diagnosis not present

## 2019-06-20 DIAGNOSIS — D2230 Melanocytic nevi of unspecified part of face: Secondary | ICD-10-CM | POA: Diagnosis not present

## 2019-06-20 DIAGNOSIS — D229 Melanocytic nevi, unspecified: Secondary | ICD-10-CM | POA: Diagnosis not present

## 2019-06-20 DIAGNOSIS — D225 Melanocytic nevi of trunk: Secondary | ICD-10-CM | POA: Diagnosis not present

## 2019-06-20 DIAGNOSIS — Z1283 Encounter for screening for malignant neoplasm of skin: Secondary | ICD-10-CM | POA: Diagnosis not present

## 2019-06-20 DIAGNOSIS — Z85828 Personal history of other malignant neoplasm of skin: Secondary | ICD-10-CM | POA: Diagnosis not present

## 2019-06-20 DIAGNOSIS — D1800 Hemangioma unspecified site: Secondary | ICD-10-CM | POA: Diagnosis not present

## 2019-06-24 ENCOUNTER — Other Ambulatory Visit: Payer: PPO

## 2019-06-24 ENCOUNTER — Ambulatory Visit (INDEPENDENT_AMBULATORY_CARE_PROVIDER_SITE_OTHER): Payer: PPO

## 2019-06-24 ENCOUNTER — Other Ambulatory Visit: Payer: Self-pay

## 2019-06-24 DIAGNOSIS — M25851 Other specified joint disorders, right hip: Secondary | ICD-10-CM | POA: Diagnosis not present

## 2019-06-24 DIAGNOSIS — M25551 Pain in right hip: Secondary | ICD-10-CM | POA: Diagnosis not present

## 2019-06-26 ENCOUNTER — Encounter: Payer: Self-pay | Admitting: Family Medicine

## 2019-06-26 ENCOUNTER — Other Ambulatory Visit: Payer: Self-pay | Admitting: Family Medicine

## 2019-06-26 DIAGNOSIS — R937 Abnormal findings on diagnostic imaging of other parts of musculoskeletal system: Secondary | ICD-10-CM

## 2019-06-26 DIAGNOSIS — Z780 Asymptomatic menopausal state: Secondary | ICD-10-CM

## 2019-07-04 ENCOUNTER — Other Ambulatory Visit: Payer: Self-pay | Admitting: Cardiovascular Disease

## 2019-08-27 ENCOUNTER — Ambulatory Visit
Admission: RE | Admit: 2019-08-27 | Discharge: 2019-08-27 | Disposition: A | Discharge status: Home / Self Care | Payer: PPO | Source: Ambulatory Visit | Attending: Family Medicine | Admitting: Family Medicine

## 2019-08-27 DIAGNOSIS — Z780 Asymptomatic menopausal state: Secondary | ICD-10-CM | POA: Diagnosis not present

## 2019-08-27 DIAGNOSIS — R937 Abnormal findings on diagnostic imaging of other parts of musculoskeletal system: Secondary | ICD-10-CM | POA: Insufficient documentation

## 2019-08-27 DIAGNOSIS — M8589 Other specified disorders of bone density and structure, multiple sites: Secondary | ICD-10-CM | POA: Diagnosis not present

## 2019-09-16 ENCOUNTER — Other Ambulatory Visit: Payer: Self-pay | Admitting: Hematology and Oncology

## 2019-09-16 DIAGNOSIS — D0512 Intraductal carcinoma in situ of left breast: Secondary | ICD-10-CM

## 2019-09-27 ENCOUNTER — Other Ambulatory Visit: Payer: Self-pay | Admitting: Cardiovascular Disease

## 2019-10-01 ENCOUNTER — Telehealth: Payer: Self-pay

## 2019-10-01 NOTE — Telephone Encounter (Signed)
Copied from [place] [id number]. Topic: General - Other >> Oct 01, 2019  8:51 AM Reyne Dumas L wrote: Reason for CRM:   Pt states that an appointment was scheduled for a bone density scan for her and insurance has denied it because there was no prior approval.  Pt would like to know what is going on with this. Pt also states that she needs a copy of her pathology report from when she had breast cancer - she didn't realize that she had a cancer policy and they need this.

## 2019-10-02 NOTE — Telephone Encounter (Signed)
TB-I spoke to pt/She states that the insurance said to her that there was no Prior Approval for the BMD scan which caused denial/I advised her that I would send this to you for you to research or get it to who it needs to be sent to then she would be called back/thx dmf  (I already took care of the other request in this same note)

## 2019-11-13 ENCOUNTER — Encounter: Payer: Self-pay | Admitting: Nurse Practitioner

## 2019-11-13 ENCOUNTER — Other Ambulatory Visit: Payer: Self-pay

## 2019-11-13 ENCOUNTER — Ambulatory Visit (INDEPENDENT_AMBULATORY_CARE_PROVIDER_SITE_OTHER): Payer: PPO | Admitting: Nurse Practitioner

## 2019-11-13 VITALS — BP 140/86 | HR 53 | Temp 96.4°F | Ht 67.5 in | Wt 154.8 lb

## 2019-11-13 DIAGNOSIS — I340 Nonrheumatic mitral (valve) insufficiency: Secondary | ICD-10-CM

## 2019-11-13 DIAGNOSIS — I471 Supraventricular tachycardia: Secondary | ICD-10-CM

## 2019-11-13 MED ORDER — METOPROLOL SUCCINATE ER 25 MG PO TB24: 12.5000 mg | ORAL_TABLET | Freq: Every day | ORAL | 3 refills | Status: DC

## 2019-11-13 NOTE — Patient Instructions (Signed)
Medication Instructions:  Your physician recommends that you continue on your current medications as directed. Please refer to the Current Medication list given to you today.  *If you need a refill on your cardiac medications before your next appointment, please call your pharmacy*  Lab Work: None ordered  If you have labs (blood work) drawn today and your tests are completely normal, you will receive your results only by: Marland Kitchen MyChart Message (if you have MyChart) OR . A paper copy in the mail If you have any lab test that is abnormal or we need to change your treatment, we will call you to review the results.  Testing/Procedures: None ordered   Follow-Up: At [facility], you and your health needs are our priority.  As part of our continuing mission to provide you with exceptional heart care, we have created designated Provider Care Teams.  These Care Teams include your primary Cardiologist (physician) and Advanced Practice Providers (APPs -  Physician Assistants and Nurse Practitioners) who all work together to provide you with the care you need, when you need it.  Your next appointment:   12 month(s)  The format for your next appointment:   In Person  Provider:    You may see Ida Rogue, MD or Murray Hodgkins, NP.

## 2019-11-13 NOTE — Progress Notes (Signed)
Office Visit    Patient Name: Susan [name] Date of Encounter: 11/13/2019  Primary Care Provider:  Lucille Passy, MD Primary Cardiologist:  Ida Rogue, MD  Chief Complaint    68 year old female with a history of PSVT, mild to moderate mitral regurgitation, hypertension, diastolic dysfunction, GERD, and breast cancer, who presents for follow-up of PSVT.  Past Medical History    Past Medical History:  Diagnosis Date  . Arthritis   . 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 Cancer ([place])   . Complication of anesthesia   . Dysrhythmia   . Family history of breast cancer   . Gastric reflux 90's  . GERD (gastroesophageal reflux disease)   . Headache    H/O MIGRAINES  . Hiatal hernia 2003  . History of Coronary CT for Calcium Scoring    a. 11/2018 CT Cor Ca2+ = Zero.  Marland Kitchen Hypertension   . Mitral regurgitation    a. 03/2017 Echo: EF 55-60%, no nrwma, Mod MR; b. 03/2018 Echo: EF 60-65%, no rwma. Gr2 DD. Mild MV prolapse of ant leaflet w/ mild to mod mR. Nl RV fxn. Mild to mod TR.  Marland Kitchen Personal history of radiation therapy 2019   LEFT lumpectomy  . PONV (postoperative nausea and vomiting)    N/V  . PSVT (paroxysmal supraventricular tachycardia) ([place])    a. 03/2017 Event Monitor: Avg HR 66, 10 episodes of SVT up to 42.8 secs, fastest 148 bpm. 8 beats WCT (179 bpm) - VT vs SVT w/ aberrancy. Rare PACs (2.6% total beats).   Past Surgical History:  Procedure Laterality Date  . APPENDECTOMY    . BREAST BIOPSY Left 06/13/2018   Affirm Bx- coil clip   atypical sclerosing and DCIS  . BREAST EXCISIONAL BIOPSY Right 2000   benign  . BREAST LUMPECTOMY Left 06/26/2018   atypical sclerosing lesion and DCIS bracketing  . BREAST LUMPECTOMY WITH NEEDLE LOCALIZATION Left 06/26/2018   Procedure: BREAST LUMPECTOMY WITH NEEDLE LOCALIZATION;  Surgeon: Jules Husbands, MD;  Location: [facility] ORS;  Service: General;  Laterality: Left;  . CHOLECYSTECTOMY    . OVARIAN CYST SURGERY    . TONSILLECTOMY     age 59  . UPPER GI ENDOSCOPY  2003    Allergies  No Known Allergies  History of Present Illness    68 year old female with a history of PSVT, mild to moderate mitral digitation, hypertension, diastolic dysfunction, GERD, and breast cancer.  She was initially evaluated by cardiology in April 2018 in the setting of tachypalpitations.  Monitoring revealed 10 episodes of PSVT and also one 8 beat episode of wide-complex tachycardia (nonsustained VT versus SVT with aberrancy).  Echocardiogram showed normal LV function with moderate mitral regurgitation.  She has been managed with metoprolol therapy.  Follow-up echocardiogram in April 2019 showed normal LV function, grade 2 diastolic dysfunction, and mild to moderate mitral regurgitation/tricuspid regurgitation.  She was last seen in clinic in November 2019, at which time she was doing well without any significant palpitations.  For screening purposes, she underwent coronary CT for calcium scoring, revealing a calcium score of 0.  Since her last visit 1 year ago, she has done well.  She notes occasional, brief palpitations but says they are not bothersome enough to account for how frequently they are occurring.  May be once every 2 weeks or so.  She remains active, walking up to 5 miles per day without any symptoms or limitations.  She denies chest pain, dyspnea, PND,  orthopnea, dizziness, syncope, edema, or early satiety.  Home Medications    Prior to Admission medications   Medication Sig Start Date End Date Taking? Authorizing Provider  ALPRAZolam Duanne Moron) 0.5 MG tablet Take 1 tablet (0.5 mg total) by mouth at bedtime as needed. for sleep 04/10/18   Lucille Passy, MD  calcium citrate-vitamin D 500-500 MG-UNIT chewable tablet Chew 1 tablet by mouth 2 (two) times daily.    [provider]  cholecalciferol (VITAMIN D) 1000 units tablet Take 1,000 Units by mouth daily.    [provider]  Cyanocobalamin (B-12) 1000 MCG CAPS Take 1 tablet  by mouth daily.  03/28/19   [provider]  famotidine (PEPCID) 20 MG tablet Take 20 mg by mouth as needed for heartburn or indigestion.    [provider]  fluticasone (FLONASE) 50 MCG/ACT nasal spray Place 2 sprays into both nostrils as needed.     [provider]  metoprolol succinate (TOPROL-XL) 25 MG 24 hr tablet Take 0.5 tablets (12.5 mg total) by mouth daily. *NEEDS OFFICE VISIT FOR FURTHER REFILLS* 09/27/19   Minna Merritts, MD  naproxen sodium (ANAPROX) 220 MG tablet Take 220 mg by mouth daily as needed (pain).     [provider]  psyllium (TGT PSYLLIUM FIBER) 0.52 g capsule Take 0.52 g by mouth 3 (three) times daily.     [provider]  SIMETHICONE-80 PO Take 80 mg by mouth as needed (gas).     [provider]  tamoxifen (NOLVADEX) 20 MG tablet TAKE 1 TABLET BY MOUTH EVERY DAY 09/16/19   Lequita Asal, MD    Review of Systems    Occasional, brief palpitations.  She denies chest pain, dyspnea, pnd, orthopnea, n, v, dizziness, syncope, edema, weight gain, or early satiety. .  All other systems reviewed and are otherwise negative except as noted above.  Physical Exam    VS:  BP 140/86 (BP Location: Left Arm, Patient Position: Sitting, Cuff Size: Normal)   Pulse (!) 53   Temp (!) 96.4 F (35.8 C)   Ht 5' 7.5" (1.715 m)   Wt 154 lb 12 oz (70.2 kg)   SpO2 95%   BMI 23.88 kg/m  , BMI Body mass index is 23.88 kg/m. GEN: Well nourished, well developed, in no acute distress. HEENT: normal. Neck: Supple, no JVD, carotid bruits, or masses. Cardiac: RRR, no rubs, or gallops.  I do not appreciate an MR murmur.  No clubbing, cyanosis, edema.  Radials/PT 2+ and equal bilaterally.  Respiratory:  Respirations regular and unlabored, clear to auscultation bilaterally. GI: Soft, nontender, nondistended, BS + x 4. MS: no deformity or atrophy. Skin: warm and dry, no rash. Neuro:  Strength and sensation are intact. Psych: Normal  affect.  Accessory Clinical Findings    ECG personally reviewed by me today -sinus bradycardia, 53, biatrial enlargement - no acute changes.  Lab Results  Component Value Date   WBC 4.9 06/12/2019   HGB 15.1 (H) 06/12/2019   HCT 44.8 06/12/2019   MCV 91.2 06/12/2019   PLT 255.0 06/12/2019   Lab Results  Component Value Date   CREATININE 0.82 06/12/2019   BUN 15 06/12/2019   NA 140 06/12/2019   K 4.7 06/12/2019   CL 102 06/12/2019   CO2 31 06/12/2019   Lab Results  Component Value Date   ALT 17 06/12/2019   AST 18 06/12/2019   ALKPHOS 38 (L) 06/12/2019   BILITOT 1.1 06/12/2019   Lab Results  Component Value Date   CHOL 202 (H) 05/30/2018   HDL 66.00 05/30/2018   LDLCALC 122 (H) 05/30/2018   TRIG 72.0 05/30/2018   CHOLHDL 3 05/30/2018     Assessment & Plan    1.  PSVT: Stable over the past year on low-dose beta-blocker therapy.  She notes occasional palpitations, generally brief and maybe once every 2 weeks or so.  Overall, she is pleased with this low burden.  She remains on low-dose beta-blocker therapy, which I am refilling today.  2.  Mild to moderate mitral regurgitation: Moderate on echo in April 2018, mild to moderate on echo in April 2019.  She is active and asymptomatic.  I do not appreciate an MR murmur on examination.  Can likely plan to follow-up echo in 2021.  3.  Hyperlipidemia: LDL of 122 with total cholesterol of 202 in June 2019.  Fortunately, coronary calcium score is 0.  She is not on a statin.  4.  Disposition: Follow-up in 1 year or sooner if necessary.   Murray Hodgkins, NP 11/13/2019, 12:03 PM

## 2019-11-29 ENCOUNTER — Inpatient Hospital Stay: Payer: PPO | Attending: Hematology and Oncology

## 2019-11-29 ENCOUNTER — Other Ambulatory Visit: Payer: Self-pay

## 2019-11-29 DIAGNOSIS — Z79899 Other long term (current) drug therapy: Secondary | ICD-10-CM | POA: Insufficient documentation

## 2019-11-29 DIAGNOSIS — D0512 Intraductal carcinoma in situ of left breast: Secondary | ICD-10-CM | POA: Diagnosis not present

## 2019-11-29 DIAGNOSIS — Z170 Estrogen receptor positive status [ER+]: Secondary | ICD-10-CM | POA: Insufficient documentation

## 2019-11-29 DIAGNOSIS — M85852 Other specified disorders of bone density and structure, left thigh: Secondary | ICD-10-CM

## 2019-11-29 DIAGNOSIS — M8580 Other specified disorders of bone density and structure, unspecified site: Secondary | ICD-10-CM | POA: Insufficient documentation

## 2019-11-29 LAB — CBC WITH DIFFERENTIAL/PLATELET
Abs Immature Granulocytes: 0.01 10*3/uL (ref 0.00–0.07)
Basophils Absolute: 0.1 10*3/uL (ref 0.0–0.1)
Basophils Relative: 2 %
Eosinophils Absolute: 0.1 10*3/uL (ref 0.0–0.5)
Eosinophils Relative: 2 %
HCT: 42.6 % (ref 36.0–46.0)
Hemoglobin: 13.8 g/dL (ref 12.0–15.0)
Immature Granulocytes: 0 %
Lymphocytes Relative: 21 %
Lymphs Abs: 1.1 10*3/uL (ref 0.7–4.0)
MCH: 29.9 pg (ref 26.0–34.0)
MCHC: 32.4 g/dL (ref 30.0–36.0)
MCV: 92.2 fL (ref 80.0–100.0)
Monocytes Absolute: 0.6 10*3/uL (ref 0.1–1.0)
Monocytes Relative: 12 %
Neutro Abs: 3.3 10*3/uL (ref 1.7–7.7)
Neutrophils Relative %: 63 %
Platelets: 234 10*3/uL (ref 150–400)
RBC: 4.62 MIL/uL (ref 3.87–5.11)
RDW: 13.2 % (ref 11.5–15.5)
WBC: 5.3 10*3/uL (ref 4.0–10.5)
nRBC: 0 % (ref 0.0–0.2)

## 2019-11-29 LAB — COMPREHENSIVE METABOLIC PANEL
ALT: 19 U/L (ref 0–44)
AST: 21 U/L (ref 15–41)
Albumin: 4.1 g/dL (ref 3.5–5.0)
Alkaline Phosphatase: 40 U/L (ref 38–126)
Anion gap: 8 (ref 5–15)
BUN: 21 mg/dL (ref 8–23)
CO2: 26 mmol/L (ref 22–32)
Calcium: 8.9 mg/dL (ref 8.9–10.3)
Chloride: 103 mmol/L (ref 98–111)
Creatinine, Ser: 0.75 mg/dL (ref 0.44–1.00)
GFR calc Af Amer: >60 mL/min (ref >60)
GFR calc non Af Amer: >60 mL/min (ref >60)
Glucose, Bld: 106 mg/dL — ABNORMAL HIGH (ref 70–99)
Potassium: 4.5 mmol/L (ref 3.5–5.1)
Sodium: 137 mmol/L (ref 135–145)
Total Bilirubin: 1.1 mg/dL (ref 0.3–1.2)
Total Protein: 6.6 g/dL (ref 6.5–8.1)

## 2019-11-29 IMAGING — DX DG HIP (WITH OR WITHOUT PELVIS) 2-3V RIGHT
2 series · 2 of 2 positions shown · non-contrast
Comparison: None.

CLINICAL DATA: Pain

EXAM:
DG HIP (WITH OR WITHOUT PELVIS) 2-3V RIGHT

[pelvis ap]
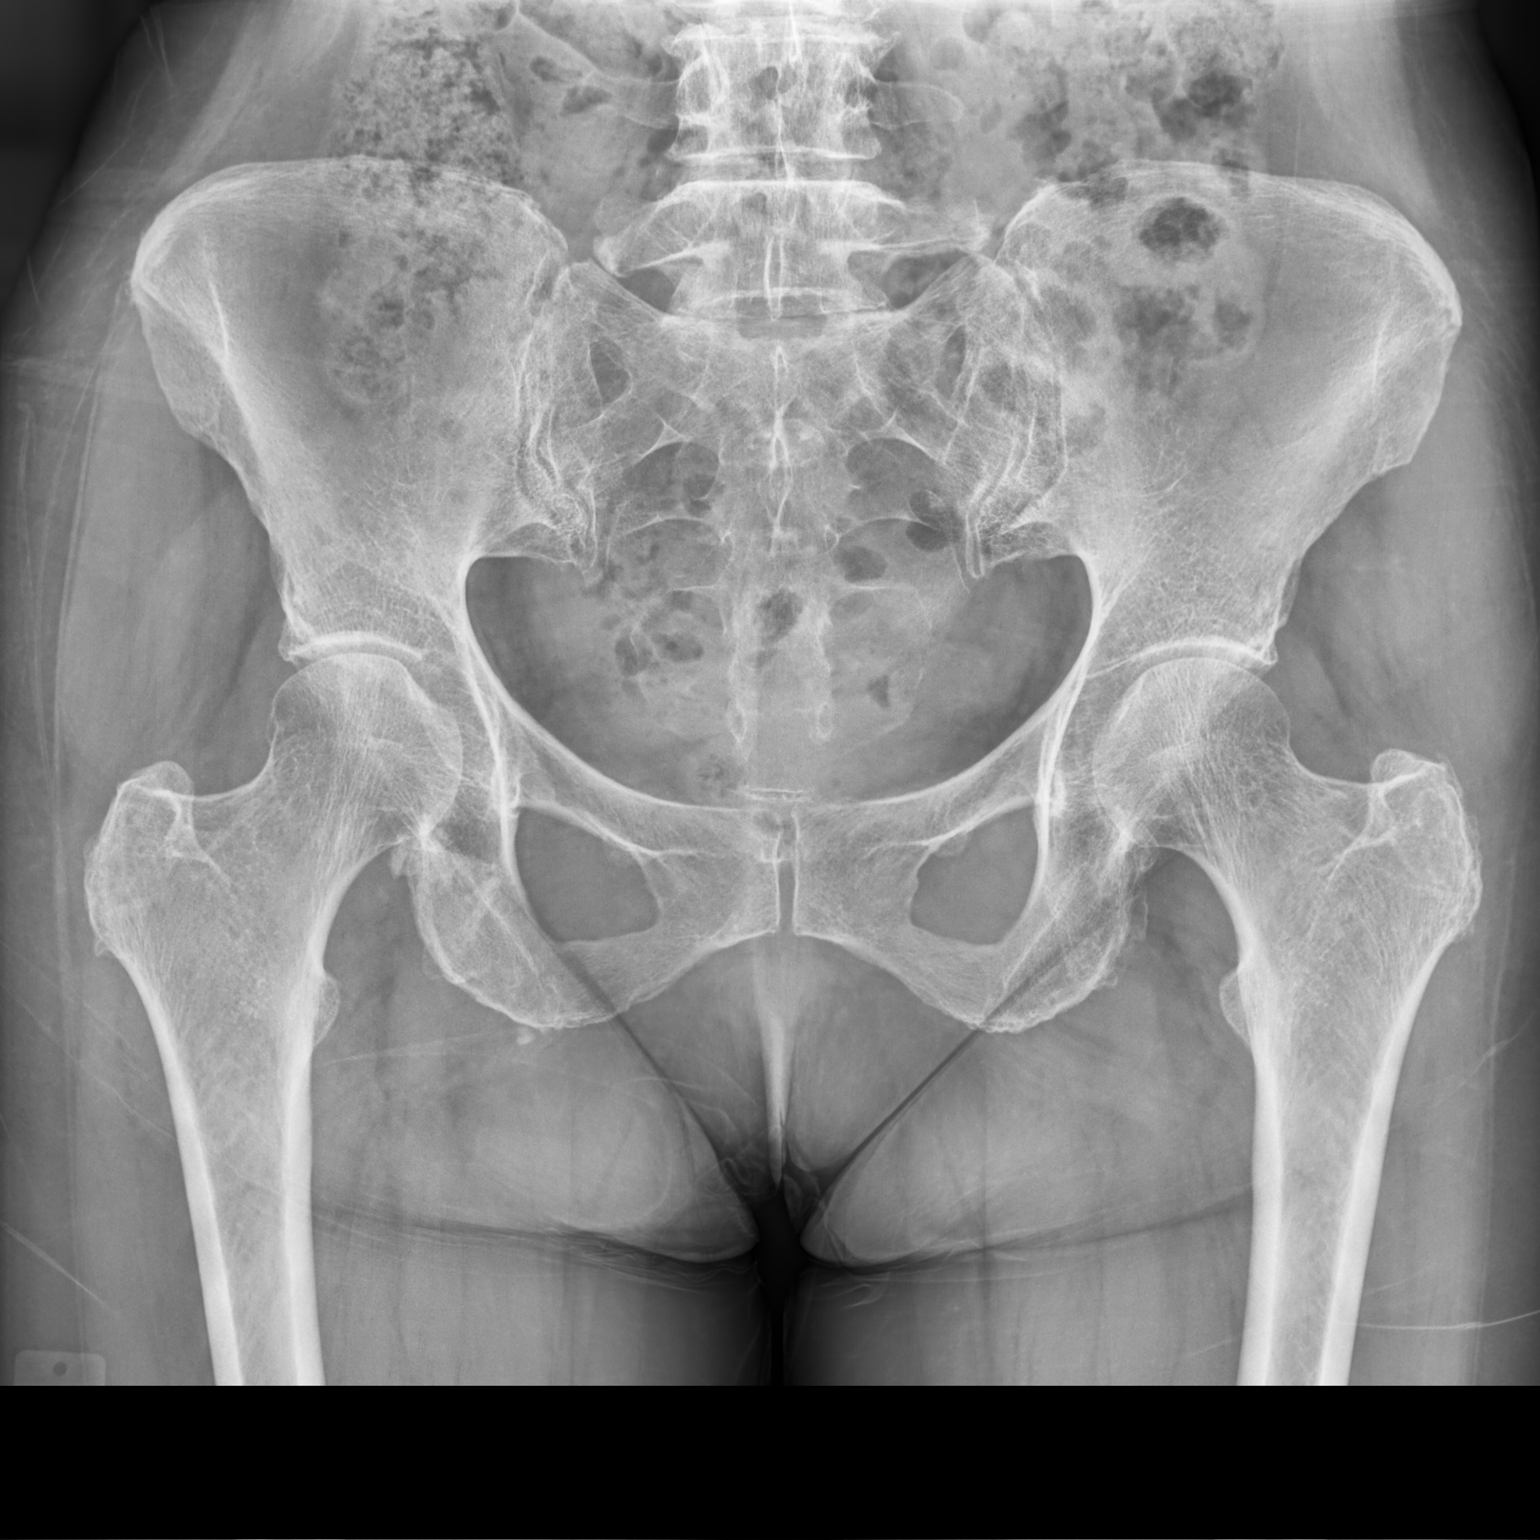

[hip frog leg lat]
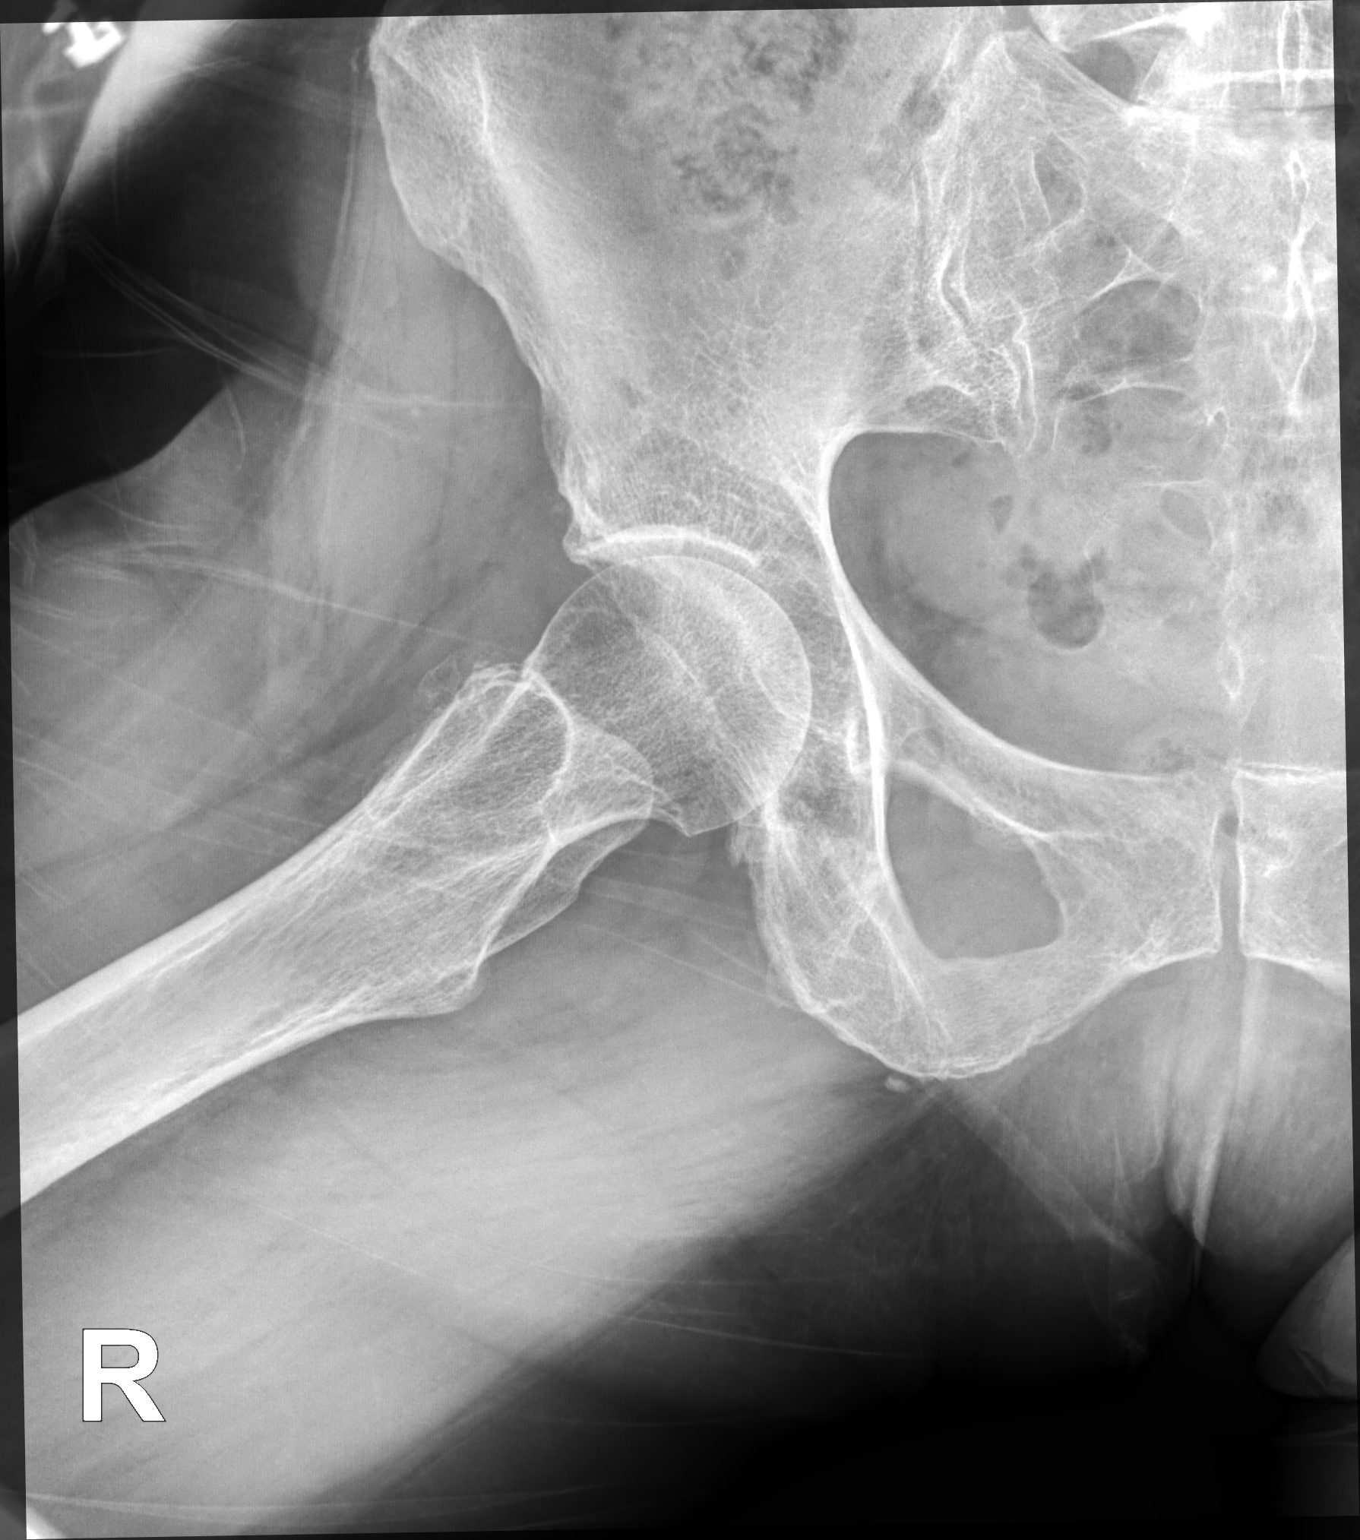

[2 of 2 positions shown; findings below may reference images not displayed]

FINDINGS: Frontal pelvis as well as lateral right hip images were obtained. No
evident fracture or dislocation. There is mild symmetric narrowing
of each hip joint. No erosive change. Bones appear osteoporotic.
IMPRESSION: Bones appear osteoporotic. No fracture or dislocation. Slight
symmetric narrowing of each hip joint.

## 2019-11-30 NOTE — Progress Notes (Signed)
[facility]  [address] [place], [place] [address] Phone: [phone]  Fax: [phone]   Telemedicine Office Visit:  12/02/2019  Referring physician: Lucille Passy, MD  I connected with [name] on 12/02/19 at 9:30 AM by videoconferencing and verified that I was speaking with the correct person using 2 identifiers.  The patient was out walking.  I discussed the limitations, risk, security and privacy concerns of performing an evaluation and management service by videoconferencing and the availability of in person appointments.  I also discussed with the patient that there may be a patient responsible charge related to this service.  The patient expressed understanding and agreed to proceed.   Chief Complaint: Susan [name] is a 68 y.o. 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 At that time, she denied any concerns.  She noted that her left nipple was still "flattened over". She denied any nipple discharge, breast nodularity or erythema.  She was seen for 6 month follow up by Dr. Baruch Gouty 06/14/2019. Suggestion for mammoplasty was discussed. She has follow up in 6 months.   Diagnostic bilateral mammogram on 06/14/2019 revealed no evidence of malignancy in either breast.   She was seen via telemedicine for chronic right hip pain with Talia Aron,MD on 06/19/2019. She was taking glucosamine since 06/13/2019 which helped. Plain films of the hip on 06/24/2019 showed osteoporotic bones with no fracture or dislocation. There was slight symmetric narrowing of each hip joint. She was advised to continue glucosamine and 1 Alleve before meals.   Bone density on 08/27/2019 revealed osteopenia with a T-score of -1.7 in the left femur neck. Lumbar spine was not utilized due to advanced degenerative changes.  She was seen for follow up  of PSVT with Dr. Sharolyn Douglas on 11/13/2019. She was going fine with occasional palpitations. She has follow up in 1 year.  Labs followed: 06/12/2019: Hematocrit 44.8, hemoglobin 15.1, MCV 91.2, platelets 255,000, WBC 4900, ANC 3200.  11/29/2019: Hematocrit 42.6, hemoglobin 13.8, MCV 92.2, platelets 134,000, WBC 5300, ANC 3300.   During the interim, she hasn't talked to a Psychiatric nurse due to [place] about reconstruction. She remains interested in it. Her hip pain is better.   Symptomatically, she denies any concerns. She notes her weight is up due to [place] but she gets out and walks and tries to be as active.    Past Medical History:  Diagnosis Date  . Arthritis   . Breast cancer ([place])    Left DCIS  . Cancer ([place])   . Complication of anesthesia   . Dysrhythmia   . Family history of breast cancer   . Gastric reflux 90's  . GERD (gastroesophageal reflux disease)   . Headache    H/O MIGRAINES  . Hiatal hernia 2003  . History of Coronary CT for Calcium Scoring    a. 11/2018 CT Cor Ca2+ = Zero.  Marland Kitchen Hypertension   . Mitral regurgitation    a. 03/2017 Echo: EF 55-60%, no nrwma, Mod MR; b. 03/2018 Echo: EF 60-65%, no rwma. Gr2 DD. Mild MV prolapse of ant leaflet w/ mild to mod mR. Nl RV fxn. Mild to mod TR.  Marland Kitchen Personal history of radiation therapy 2019   LEFT lumpectomy  . PONV (postoperative nausea and vomiting)    N/V  . PSVT (paroxysmal supraventricular tachycardia) ([place])    a. 03/2017 Event  Monitor: Avg HR 66, 10 episodes of SVT up to 42.8 secs, fastest 148 bpm. 8 beats WCT (179 bpm) - VT vs SVT w/ aberrancy. Rare PACs (2.6% total beats).    Past Surgical History:  Procedure Laterality Date  . APPENDECTOMY    . BREAST BIOPSY Left 06/13/2018   Affirm Bx- coil clip   atypical sclerosing and DCIS  . BREAST EXCISIONAL BIOPSY Right 2000   benign  . BREAST LUMPECTOMY Left 06/26/2018   atypical sclerosing lesion and DCIS bracketing  . BREAST LUMPECTOMY WITH NEEDLE LOCALIZATION Left 06/26/2018    Procedure: BREAST LUMPECTOMY WITH NEEDLE LOCALIZATION;  Surgeon: Jules Husbands, MD;  Location: [facility] ORS;  Service: General;  Laterality: Left;  . CHOLECYSTECTOMY    . OVARIAN CYST SURGERY    . TONSILLECTOMY     age 20  . UPPER GI ENDOSCOPY  2003    Family History  Problem Relation Age of Onset  . Hyperlipidemia Mother   . Hypertension Mother   . COPD Mother   . Heart failure Mother        deceased 84  . Stroke Father        deceased 40  . Hypertension Father   . Hypertension Sister   . Hypertension Brother   . Healthy Brother   . Breast cancer Cousin 2       daughter of paternal aunt; currently 68  . Breast cancer Cousin 49       daughter of paternal uncle; currently 70  . Breast cancer Cousin 71       daughter of maternal uncle; deceased 68  . Testicular cancer Other 8       sister's son; currently 66  . Melanoma Paternal Uncle   . Breast cancer Cousin 35       daughter of same maternal uncle; currently 60    Social History:  reports that she has never smoked. She has never used smokeless tobacco. She reports current alcohol use of about 2.0 standard drinks of alcohol per week. She reports that she does not use drugs. She denies tobacco use. She drinks wine with dinner. Patient is a [place] native, however has spent the last several years in [place]. Patient is a retired Administrator, arts. Patient denies known exposures to radiation on toxins. She has a golden doodle name Tharon Aquas. The patient is alone  today.  Participants in the patient's visit and their role in the encounter included the patient, Samul Dada, and [employer], CMA, today.  The intake visit was provided by Samul Dada, and Vito Berger, CMA.    Allergies: No Known Allergies  Current Medications: Current Outpatient Medications  Medication Sig Dispense Refill  . ALPRAZolam (XANAX) 0.5 MG tablet Take 1 tablet (0.5 mg total) by mouth at bedtime as needed. for sleep 30 tablet 1  . calcium  citrate-vitamin D 500-500 MG-UNIT chewable tablet Chew 1 tablet by mouth 2 (two) times daily.    . cholecalciferol (VITAMIN D) 1000 units tablet Take 1,000 Units by mouth daily.    . famotidine (PEPCID) 20 MG tablet Take 20 mg by mouth as needed for heartburn or indigestion.    . fluticasone (FLONASE) 50 MCG/ACT nasal spray Place 2 sprays into both nostrils as needed.     . metoprolol succinate (TOPROL-XL) 25 MG 24 hr tablet Take 0.5 tablets (12.5 mg total) by mouth daily. 45 tablet 3  . psyllium (TGT PSYLLIUM FIBER) 0.52 g capsule Take 0.52 g by mouth 3 (three)  times daily.     Marland Kitchen SIMETHICONE-80 PO Take 80 mg by mouth as needed (gas).     . tamoxifen (NOLVADEX) 20 MG tablet TAKE 1 TABLET BY MOUTH EVERY DAY 90 tablet 3  . Cyanocobalamin (B-12) 1000 MCG CAPS Take 1 tablet by mouth daily.     . naproxen sodium (ANAPROX) 220 MG tablet Take 220 mg by mouth daily as needed (pain).      No current facility-administered medications for this visit.     Review of Systems  Constitutional: Negative.  Negative for chills, diaphoresis, fever, malaise/fatigue and weight loss (up).       Active.  HENT: Negative.  Negative for congestion, ear pain, hearing loss, nosebleeds, sinus pain and sore throat.   Eyes: Negative for blurred vision and double vision.  Respiratory: Negative for cough, sputum production, shortness of breath and wheezing.   Cardiovascular: Negative.  Negative for chest pain, palpitations, claudication, leg swelling and PND.  Gastrointestinal: Negative.  Negative for abdominal pain, blood in stool, constipation, diarrhea, heartburn, melena, nausea and vomiting.  Genitourinary: Negative.  Negative for dysuria, frequency and urgency.  Musculoskeletal: Positive for myalgias (nocturnal leg cramps- chronic;  right hip pain- improving). Negative for back pain and joint pain.  Skin: Negative.  Negative for rash.  Neurological: Negative for dizziness, tingling, sensory change, weakness and  headaches.  Endo/Heme/Allergies: Does not bruise/bleed easily.  Psychiatric/Behavioral: Negative for depression and memory loss. The patient is not nervous/anxious and does not have insomnia.   All other systems reviewed and are negative.  Performance status (ECOG): 0 - Asymptomatic  Vitals There were no vitals taken for this visit.   Physical Exam  Constitutional: She is oriented to person, place, and time. She appears well-developed and well-nourished. No distress.  HENT:  Head: Normocephalic and atraumatic.  Short gray hair.    Eyes: Conjunctivae and EOM are normal. No scleral icterus.  Glasses.  Brown eyes.  Neurological: She is alert and oriented to person, place, and time.  Skin: She is not diaphoretic.  Psychiatric: She has a normal mood and affect. Her behavior is normal. Judgment and thought content normal.  Nursing note reviewed.   No visits with results within 3 Day(s) from this visit.  Latest known visit with results is:  Appointment on 11/29/2019  Component Date Value Ref Range Status  . WBC 11/29/2019 5.3  4.0 - 10.5 K/uL Final  . RBC 11/29/2019 4.62  3.87 - 5.11 MIL/uL Final  . Hemoglobin 11/29/2019 13.8  12.0 - 15.0 g/dL Final  . HCT 11/29/2019 42.6  36.0 - 46.0 % Final  . MCV 11/29/2019 92.2  80.0 - 100.0 fL Final  . MCH 11/29/2019 29.9  26.0 - 34.0 pg Final  . MCHC 11/29/2019 32.4  30.0 - 36.0 g/dL Final  . RDW 11/29/2019 13.2  11.5 - 15.5 % Final  . Platelets 11/29/2019 234  150 - 400 K/uL Final  . nRBC 11/29/2019 0.0  0.0 - 0.2 % Final  . Neutrophils Relative % 11/29/2019 63  % Final  . Neutro Abs 11/29/2019 3.3  1.7 - 7.7 K/uL Final  . Lymphocytes Relative 11/29/2019 21  % Final  . Lymphs Abs 11/29/2019 1.1  0.7 - 4.0 K/uL Final  . Monocytes Relative 11/29/2019 12  % Final  . Monocytes Absolute 11/29/2019 0.6  0.1 - 1.0 K/uL Final  . Eosinophils Relative 11/29/2019 2  % Final  . Eosinophils Absolute 11/29/2019 0.1  0.0 - 0.5 K/uL Final  . Basophils  Relative 11/29/2019 2  % Final  . Basophils Absolute 11/29/2019 0.1  0.0 - 0.1 K/uL Final  . Immature Granulocytes 11/29/2019 0  % Final  . Abs Immature Granulocytes 11/29/2019 0.01  0.00 - 0.07 K/uL Final   Performed at [facility], [address] [place], [place] [address]  . Sodium 11/29/2019 137  135 - 145 mmol/L Final  . Potassium 11/29/2019 4.5  3.5 - 5.1 mmol/L Final  . Chloride 11/29/2019 103  98 - 111 mmol/L Final  . CO2 11/29/2019 26  22 - 32 mmol/L Final  . Glucose, Bld 11/29/2019 106* 70 - 99 mg/dL Final  . BUN 11/29/2019 21  8 - 23 mg/dL Final  . Creatinine, Ser 11/29/2019 0.75  0.44 - 1.00 mg/dL Final  . Calcium 11/29/2019 8.9  8.9 - 10.3 mg/dL Final  . Total Protein 11/29/2019 6.6  6.5 - 8.1 g/dL Final  . Albumin 11/29/2019 4.1  3.5 - 5.0 g/dL Final  . AST 11/29/2019 21  15 - 41 U/L Final  . ALT 11/29/2019 19  0 - 44 U/L Final  . Alkaline Phosphatase 11/29/2019 40  38 - 126 U/L Final  . Total Bilirubin 11/29/2019 1.1  0.3 - 1.2 mg/dL Final  . GFR calc non Af Amer 11/29/2019 >60  >60 mL/min Final  . GFR calc Af Amer 11/29/2019 >60  >60 mL/min Final  . Anion gap 11/29/2019 8  5 - 15 Final   Performed at [facility], [address] [place], [place] [address]    Assessment:  Estha Maric is a 68 y.o. female with left breast DCISs/p left partial mastectomy with needle localization on 06/26/2018. Pathologyrevealed at least 28 mm grade 2-3 DCIS with sclerosing adenosis, cystic papillary apocrine metaplasia. There was central expansive comedonecrosis. Margins were negative (closest 1.7 mm). DCIS was ER positive (>90%) and PR negative (<1%). Pathologic stagewas pTis pNx.  Diagnostic left mammogramon 06/06/2018 revealed indeterminate microcalcifications over the inner midportion of the left breast. There were 2 adjacent groups of heterogeneous microcalcifications with some linear forms spanning 1.1 x 2.0 x 2.4 cm.  Diagnostic bilateral mammogram on  06/14/2019 revealed no evidence of malignancy in either breast.   She receivedradiation(07/30/2018 - 08/20/2018) followed by electron beam treatment (08/21/2018 - 08/31/2018). She began tamoxifenon 09/05/2018.  Bone densityon 07/26/2018 revealed osteopenia with a T-score of -1.8 in the left femoral neck.  Bone density on 08/27/2019 revealed osteopenia with a T-score of -1.7 in the left femur neck.  Symptomatically, she is doing well.  She denies any breast concerns.  Plan: 1.   Review labs from 11/29/2019. 2.   Left breast DCIS Clinically, she continues to do well.             Bilateral diagnostic mammogram on 06/14/2019 reveals no evidence of maligancy. Continue tamoxifen. Patient interested in reconstruction. Continue surveillance. 3.   Osteopenia Repeat bone density on 08/27/2019 revealed stable osteopenia.  Continue calcium and vitamin D. 4.   Schedule bilateral mammogram on 06/15/2020 5.   Plastic surgery consult (Dr Marla Roe). 6.   RTC in 6 months for MD assessment and labs (CBC with diff, CMP).  I discussed the assessment and treatment plan with the patient.  The patient was provided an opportunity to ask questions and all were answered.  The patient agreed with the plan and demonstrated an understanding of the instructions.  The patient was advised to call back if the symptoms worsen or if the condition fails to improve as anticipated.  I provided 18 minutes (9:30 AM - 9:48 AM) of face-to-face time during this this encounter and > 50% was spent counseling as documented under my assessment and plan.    Lequita Asal, MD, PhD    12/02/2019, 9:48 AM  I, Samul Dada, am acting as a scribe for Lequita Asal, MD.  I, [place] Mike Gip, MD, have reviewed the above documentation for accuracy and completeness, and I agree with the above.

## 2019-12-02 ENCOUNTER — Other Ambulatory Visit: Payer: PPO

## 2019-12-02 ENCOUNTER — Inpatient Hospital Stay (HOSPITAL_BASED_OUTPATIENT_CLINIC_OR_DEPARTMENT_OTHER): Payer: PPO | Admitting: Hematology and Oncology

## 2019-12-02 ENCOUNTER — Encounter: Payer: Self-pay | Admitting: Hematology and Oncology

## 2019-12-02 DIAGNOSIS — D0512 Intraductal carcinoma in situ of left breast: Secondary | ICD-10-CM

## 2019-12-02 DIAGNOSIS — M85852 Other specified disorders of bone density and structure, left thigh: Secondary | ICD-10-CM

## 2019-12-02 NOTE — Progress Notes (Signed)
No new changes noted today. The patient name and DOB has been verified by phone today. 

## 2019-12-23 ENCOUNTER — Encounter: Payer: Self-pay | Admitting: Plastic Surgery

## 2019-12-23 ENCOUNTER — Ambulatory Visit (INDEPENDENT_AMBULATORY_CARE_PROVIDER_SITE_OTHER): Payer: PPO | Admitting: Plastic Surgery

## 2019-12-23 ENCOUNTER — Other Ambulatory Visit: Payer: Self-pay

## 2019-12-23 VITALS — BP 149/80 | HR 68 | Temp 98.0°F | Ht 68.0 in | Wt 152.6 lb

## 2019-12-23 DIAGNOSIS — C50919 Malignant neoplasm of unspecified site of unspecified female breast: Secondary | ICD-10-CM

## 2019-12-23 NOTE — Progress Notes (Signed)
Referring Provider Lucille Passy, MD [place],  [place] [address]   CC:  Chief Complaint  Patient presents with  . Advice Only    for breast reconstruction      Susan [name] is an 68 y.o. female.  HPI: Patient presents to discuss breast reconstruction.  She had a lumpectomy and radiation on the left side for DCIS in the middle of 2018/04/01.  Over a year has passed since the completion of her radiation.  She is unhappy with the sunken in appearance around the nipple areolar complex on the left side and the soft tissue deficit.  She has also had a biopsy on the right side in the past for benign disease that is resulted in a bit of a upper pole deficiency on that side as well.  She would like the nipple areolar complex on the left to be popped out more and for the right side to be as symmetric as possible.  No Known Allergies  Outpatient Encounter Medications as of 12/23/2019  Medication Sig  . ALPRAZolam (XANAX) 0.5 MG tablet Take 1 tablet (0.5 mg total) by mouth at bedtime as needed. for sleep  . calcium citrate-vitamin D 500-500 MG-UNIT chewable tablet Chew 1 tablet by mouth 2 (two) times daily.  . cholecalciferol (VITAMIN D) 1000 units tablet Take 1,000 Units by mouth daily.  . famotidine (PEPCID) 20 MG tablet Take 20 mg by mouth as needed for heartburn or indigestion.  . fluticasone (FLONASE) 50 MCG/ACT nasal spray Place 2 sprays into both nostrils as needed.   . metoprolol succinate (TOPROL-XL) 25 MG 24 hr tablet Take 0.5 tablets (12.5 mg total) by mouth daily.  . naproxen sodium (ANAPROX) 220 MG tablet Take 220 mg by mouth daily as needed (pain).   . psyllium (TGT PSYLLIUM FIBER) 0.52 g capsule Take 0.52 g by mouth 3 (three) times daily.   Marland Kitchen SIMETHICONE-80 PO Take 80 mg by mouth as needed (gas).   . tamoxifen (NOLVADEX) 20 MG tablet TAKE 1 TABLET BY MOUTH EVERY DAY  . Cyanocobalamin (B-12) 1000 MCG CAPS Take 1 tablet by mouth daily.    No facility-administered  encounter medications on file as of 12/23/2019.     Past Medical History:  Diagnosis Date  . Arthritis   . Breast cancer ([place])    Left DCIS  . Cancer ([place])   . Complication of anesthesia   . Dysrhythmia   . Family history of breast cancer   . Gastric reflux 90's  . GERD (gastroesophageal reflux disease)   . Headache    H/O MIGRAINES  . Hiatal hernia 04/01/02  . History of Coronary CT for Calcium Scoring    a. 11/2018 CT Cor Ca2+ = Zero.  Marland Kitchen Hypertension   . Mitral regurgitation    a. 03/2017 Echo: EF 55-60%, no nrwma, Mod MR; b. 03/2018 Echo: EF 60-65%, no rwma. Gr2 DD. Mild MV prolapse of ant leaflet w/ mild to mod mR. Nl RV fxn. Mild to mod TR.  Marland Kitchen Personal history of radiation therapy 2018/04/01   LEFT lumpectomy  . PONV (postoperative nausea and vomiting)    N/V  . PSVT (paroxysmal supraventricular tachycardia) ([place])    a. 03/2017 Event Monitor: Avg HR 66, 10 episodes of SVT up to 42.8 secs, fastest 148 bpm. 8 beats WCT (179 bpm) - VT vs SVT w/ aberrancy. Rare PACs (2.6% total beats).    Past Surgical History:  Procedure Laterality Date  . APPENDECTOMY    .  BREAST BIOPSY Left 06/13/2018   Affirm Bx- coil clip   atypical sclerosing and DCIS  . BREAST EXCISIONAL BIOPSY Right 2000   benign  . BREAST LUMPECTOMY Left 06/26/2018   atypical sclerosing lesion and DCIS bracketing  . BREAST LUMPECTOMY WITH NEEDLE LOCALIZATION Left 06/26/2018   Procedure: BREAST LUMPECTOMY WITH NEEDLE LOCALIZATION;  Surgeon: Jules Husbands, MD;  Location: [facility] ORS;  Service: General;  Laterality: Left;  . CHOLECYSTECTOMY    . OVARIAN CYST SURGERY    . TONSILLECTOMY     age 87  . UPPER GI ENDOSCOPY  2003    Family History  Problem Relation Age of Onset  . Hyperlipidemia Mother   . Hypertension Mother   . COPD Mother   . Heart failure Mother        deceased 46  . Stroke Father        deceased 61  . Hypertension Father   . Hypertension Sister   . Hypertension Brother   . Healthy Brother   . Breast  cancer Cousin 46       daughter of paternal aunt; currently 4  . Breast cancer Cousin 72       daughter of paternal uncle; currently 26  . Breast cancer Cousin 57       daughter of maternal uncle; deceased 8  . Testicular cancer Other 71       sister's son; currently 30  . Melanoma Paternal Uncle   . Breast cancer Cousin 32       daughter of same maternal uncle; currently 76    Social History   Social History Narrative   She does have a living will.   Desires CPR  Denies tobacco use  Review of Systems General: Denies fevers, chills, weight loss CV: Denies chest pain, shortness of breath, palpitations  Physical Exam Vitals with BMI 12/23/2019 11/13/2019 06/19/2019  Height 5\' 8"  5' 7.5" -  Weight 152 lbs 10 oz 154 lbs 12 oz 147 lbs  BMI 23.21 [id number]  Systolic 123456 [id number]  Diastolic 80 86 77  Pulse 68 53 -    General:  No acute distress,  Alert and oriented, Non-Toxic, Normal speech and affect Breast: She has grade 2 ptosis.  On the right side she has a scar superior to the nipple areolar complex but no obvious masses.  On the left side she has a periareolar scar and a sunken in appearance to the nipple areolar complex.  There is some mild retraction from radiation changes on the left side.  There is no inframammary scars on either side.  There is no obvious masses or lymphadenopathy on either side.  Assessment/Plan Patient presents with lumpectomy defect on the left desiring improved appearance and symmetry on the right side.  I discussed the spectrum of options with her ranging from fat grafting to implant reconstruction to soft tissue flap reconstruction.  She seems to to desire improvement in the appearance without going through a large operation.  She took soft tissue flap reconstruction off the table immediately.  We agreed would be reasonable alternative would be implant placement on both sides in the subpectoral or dual plane.  That would help improve the sunken in  appearance on the left and offer symmetry on the right.  I also discussed the potential need for scar revision to release the tethering and potentially fill the defect with a dermal fat graft on the left side.  Furthermore I make a decision on the table  regarding whether or not a small mastopexy would be required on the right. If not I could do fat graft on both sides for contour refinement.  Using her gel inserts she seemed to be most interested in the range of 150 to 250 cc volume increase.  Ultimately she wanted me to make the decision on what looked best on the table.  Certainly it seems going higher than 300 cc would be inconsistent with her wishes.  We did discuss post saline and silicone implants and we both agree that silicone would be the optimal choice in her case.  We discussed the risk of the operation that include bleeding, infection, damage surrounding structures, contour irregularities, potential for infection and losing the implant, need for additional procedures.  She seems fully understanding with realistic expectations and we will plan to proceed.  Cindra Presume 12/23/2019, 2:34 PM

## 2020-01-08 ENCOUNTER — Other Ambulatory Visit: Payer: Self-pay

## 2020-01-08 ENCOUNTER — Encounter: Payer: Self-pay | Admitting: Surgical

## 2020-01-08 ENCOUNTER — Ambulatory Visit (INDEPENDENT_AMBULATORY_CARE_PROVIDER_SITE_OTHER): Payer: PPO | Admitting: Surgical

## 2020-01-08 VITALS — BP 155/83 | HR 61 | Temp 97.5°F | Ht 68.0 in | Wt 151.0 lb

## 2020-01-08 DIAGNOSIS — C50919 Malignant neoplasm of unspecified site of unspecified female breast: Secondary | ICD-10-CM

## 2020-01-08 MED ORDER — HYDROCODONE-ACETAMINOPHEN 5-325 MG PO TABS: 1.0000 | ORAL_TABLET | Freq: Four times a day (QID) | ORAL | 0 refills | Status: AC | PRN

## 2020-01-08 MED ORDER — ONDANSETRON HCL 4 MG PO TABS: 4.0000 mg | ORAL_TABLET | Freq: Three times a day (TID) | ORAL | 0 refills | Status: DC | PRN

## 2020-01-08 NOTE — H&P (View-Only) (Signed)
Patient ID: Susan [name], female    DOB: November 15, 1951, 69 y.o.   MRN: [id number]   Chief Complaint  Patient presents with  . Pre-op Exam    for (B) breast augmentation reconstruction w/fat grafting      ICD-10-CM   1. 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  She presents for preoperative evaluation for upcoming procedure, mammoplasty augmentation with prostesis bilaterally, possible mastopexy to right breast, liposuction with lipofilling bilaterally, possible scar revision to left breast., scheduled for 01/17/20 with Dr. Mingo Amber.  She has a hx of PONV.  Mammogram 06/14/19 revealed no evidence of malignancy in either breast. She is currently taking tamoxifen. 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  Last cardiac echo showed 60-65% EF. No hx of dvt/pe, no fmhx of dvt/pe.  No recent colds. No CP, SOB, fever, chills, n/v, dizziness, weakness, abdominal pain.   Past Medical History: Allergies: No Known Allergies  Current Medications:  Current Outpatient Medications:  .  omeprazole (PRILOSEC) 10 MG capsule, Take 10 mg by mouth daily., Disp: , Rfl:  .  ALPRAZolam (XANAX) 0.5 MG tablet, Take 1 tablet (0.5 mg total) by mouth at bedtime as needed. for sleep, Disp: 30 tablet, Rfl: 1 .  calcium citrate-vitamin D 500-500 MG-UNIT chewable tablet, Chew 1 tablet by mouth 2 (two) times daily., Disp: , Rfl:  .  cholecalciferol (VITAMIN D) 1000 units tablet, Take 1,000 Units by mouth daily., Disp: , Rfl:  .  Cyanocobalamin (B-12) 1000 MCG CAPS, Take 1 tablet by mouth daily. , Disp: , Rfl:  .  famotidine (PEPCID) 20 MG tablet, Take 20 mg by mouth as needed for  heartburn or indigestion., Disp: , Rfl:  .  fluticasone (FLONASE) 50 MCG/ACT nasal spray, Place 2 sprays into both nostrils as needed. , Disp: , Rfl:  .  metoprolol succinate (TOPROL-XL) 25 MG 24 hr tablet, Take 0.5 tablets (12.5 mg total) by mouth daily., Disp: 45 tablet, Rfl: 3 .  naproxen sodium (ANAPROX) 220 MG tablet, Take 220 mg by mouth daily as needed (pain). , Disp: , Rfl:  .  psyllium (TGT PSYLLIUM FIBER) 0.52 g capsule, Take 0.52 g by mouth 3 (three) times daily. , Disp: , Rfl:  .  SIMETHICONE-80 PO, Take 80 mg by mouth as needed (gas). , Disp: , Rfl:  .  tamoxifen (NOLVADEX) 20 MG tablet, TAKE 1 TABLET BY MOUTH EVERY DAY, Disp: 90 tablet, Rfl: 3  Past Medical Problems: Past Medical History:  Diagnosis Date  . Arthritis   . Breast cancer ([place])    Left DCIS  . Cancer ([place])   . Complication of anesthesia   . Dysrhythmia   . Family history of breast cancer   . Gastric reflux 90's  . GERD (gastroesophageal reflux disease)   . Headache    H/O MIGRAINES  . Hiatal hernia 2003  . History of Coronary CT for Calcium Scoring    a. 11/2018 CT Cor Ca2+ = Zero.  Marland Kitchen Hypertension   . Mitral regurgitation    a. 03/2017 Echo: EF 55-60%, no nrwma, Mod MR; b. 03/2018 Echo: EF 60-65%, no rwma. Gr2 DD. Mild  MV prolapse of ant leaflet w/ mild to mod mR. Nl RV fxn. Mild to mod TR.  Marland Kitchen Personal history of radiation therapy 2019   LEFT lumpectomy  . PONV (postoperative nausea and vomiting)    N/V  . PSVT (paroxysmal supraventricular tachycardia) ([place])    a. 03/2017 Event Monitor: Avg HR 66, 10 episodes of SVT up to 42.8 secs, fastest 148 bpm. 8 beats WCT (179 bpm) - VT vs SVT w/ aberrancy. Rare PACs (2.6% total beats).    Past Surgical History: Past Surgical History:  Procedure Laterality Date  . APPENDECTOMY    . BREAST BIOPSY Left 06/13/2018   Affirm Bx- coil clip   atypical sclerosing and DCIS  . BREAST EXCISIONAL BIOPSY Right 2000   benign  . BREAST LUMPECTOMY Left 06/26/2018   atypical  sclerosing lesion and DCIS bracketing  . BREAST LUMPECTOMY WITH NEEDLE LOCALIZATION Left 06/26/2018   Procedure: BREAST LUMPECTOMY WITH NEEDLE LOCALIZATION;  Surgeon: Jules Husbands, MD;  Location: [facility] ORS;  Service: General;  Laterality: Left;  . CHOLECYSTECTOMY    . OVARIAN CYST SURGERY    . TONSILLECTOMY     age 47  . UPPER GI ENDOSCOPY  2003    Social History: Social History   Socioeconomic History  . Marital status: Married    Spouse name: Not on file  . Number of children: Not on file  . Years of education: Not on file  . Highest education level: Not on file  Occupational History  . Not on file  Tobacco Use  . Smoking status: Never Smoker  . Smokeless tobacco: Never Used  Substance and Sexual Activity  . Alcohol use: Yes    Alcohol/week: 2.0 standard drinks    Types: 2 Glasses of wine per week    Comment: daily WINE  . Drug use: No  . Sexual activity: Not Currently  Other Topics Concern  . Not on file  Social History Narrative   She does have a living will.   Desires CPR   Social Determinants of Health   Financial Resource Strain:   . Difficulty of Paying Living Expenses: Not on file  Food Insecurity:   . Worried About Charity fundraiser in the Last Year: Not on file  . Ran Out of Food in the Last Year: Not on file  Transportation Needs:   . Lack of Transportation (Medical): Not on file  . Lack of Transportation (Non-Medical): Not on file  Physical Activity:   . Days of Exercise per Week: Not on file  . Minutes of Exercise per Session: Not on file  Stress:   . Feeling of Stress : Not on file  Social Connections:   . Frequency of Communication with Friends and Family: Not on file  . Frequency of Social Gatherings with Friends and Family: Not on file  . Attends Religious Services: Not on file  . Active Member of Clubs or Organizations: Not on file  . Attends Archivist Meetings: Not on file  . Marital Status: Not on file  Intimate Partner  Violence:   . Fear of Current or Ex-Partner: Not on file  . Emotionally Abused: Not on file  . Physically Abused: Not on file  . Sexually Abused: Not on file    Family History: Family History  Problem Relation Age of Onset  . Hyperlipidemia Mother   . Hypertension Mother   . COPD Mother   . Heart failure Mother  deceased 35  . Stroke Father        deceased 63  . Hypertension Father   . Hypertension Sister   . Hypertension Brother   . Healthy Brother   . Breast cancer Cousin 66       daughter of paternal aunt; currently 39  . Breast cancer Cousin 79       daughter of paternal uncle; currently 54  . Breast cancer Cousin 49       daughter of maternal uncle; deceased 86  . Testicular cancer Other 4       sister's son; currently 68  . Melanoma Paternal Uncle   . Breast cancer Cousin 25       daughter of same maternal uncle; currently 47    Review of Systems: Review of Systems  Constitutional: Negative.   Respiratory: Negative.   Cardiovascular: Negative.   Gastrointestinal: Negative.   Genitourinary: Negative.   Musculoskeletal: Negative.   Skin: Negative.   Neurological: Negative.     Physical Exam: Vital Signs BP (!) 155/83 (BP Location: Right Arm, Patient Position: Sitting, Cuff Size: Normal)   Pulse 61   Temp (!) 97.5 F (36.4 C) (Temporal)   Ht 5\' 8"  (1.727 m)   Wt 151 lb (68.5 kg)   SpO2 98%   BMI 22.96 kg/m  Physical Exam Exam conducted with a chaperone present.  Constitutional:      General: She is not in acute distress.    Appearance: Normal appearance. She is not ill-appearing.  HENT:     Head: Normocephalic and atraumatic.  Eyes:     Pupils: Pupils are equal, round Neck:     Musculoskeletal: Normal range of motion.  Cardiovascular:     Rate and Rhythm: Normal rate and regular rhythm.     Pulses: Normal pulses.     Heart sounds: Normal heart sounds. No murmur.  Pulmonary:     Effort: Pulmonary effort is normal. No respiratory  distress.     Breath sounds: Normal breath sounds. No wheezing.  Abdominal:     General: Abdomen is flat. There is no distension.     Palpations: Abdomen is soft.     Tenderness: There is no abdominal tenderness.  Musculoskeletal: Normal range of motion.  Skin:    General: Skin is warm and dry.     Findings: No erythema or rash.  Neurological:     General: No focal deficit present.     Mental Status: She is alert and oriented to person, place, and time. Mental status is at baseline.     Motor: No weakness.  Psychiatric:        Mood and Affect: Mood normal.        Behavior: Behavior normal.   Assessment/Plan: The patient is scheduled for mammoplasty augmentation with prostesis bilaterally (silicone implant), possible mastopexy to right breast, liposuction with lipofilling bilaterally, possible scar revision to left breast., scheduled for 01/17/20 with Dr. Mingo Amber.  We thoroughly covered the Risks, benefits, and alternatives of procedures discussed, questions answered and consent obtained.   Covid test scheduled. Rx sent to pharmacy.  Electronically signed by: Carola Rhine Chrstopher Malenfant, PA-C 01/08/2020 11:37 AM

## 2020-01-08 NOTE — Progress Notes (Signed)
Patient ID: Susan [name], female    DOB: 11/28/1951, 69 y.o.   MRN: [id number]   Chief Complaint  Patient presents with  . Pre-op Exam    for (B) breast augmentation reconstruction w/fat grafting      ICD-10-CM   1. 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  She presents for preoperative evaluation for upcoming procedure, mammoplasty augmentation with prostesis bilaterally, possible mastopexy to right breast, liposuction with lipofilling bilaterally, possible scar revision to left breast., scheduled for 01/17/20 with Dr. Mingo Amber.  She has a hx of PONV.  Mammogram 06/14/19 revealed no evidence of malignancy in either breast. She is currently taking tamoxifen. 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  Last cardiac echo showed 60-65% EF. No hx of dvt/pe, no fmhx of dvt/pe.  No recent colds. No CP, SOB, fever, chills, n/v, dizziness, weakness, abdominal pain.   Past Medical History: Allergies: No Known Allergies  Current Medications:  Current Outpatient Medications:  .  omeprazole (PRILOSEC) 10 MG capsule, Take 10 mg by mouth daily., Disp: , Rfl:  .  ALPRAZolam (XANAX) 0.5 MG tablet, Take 1 tablet (0.5 mg total) by mouth at bedtime as needed. for sleep, Disp: 30 tablet, Rfl: 1 .  calcium citrate-vitamin D 500-500 MG-UNIT chewable tablet, Chew 1 tablet by mouth 2 (two) times daily., Disp: , Rfl:  .  cholecalciferol (VITAMIN D) 1000 units tablet, Take 1,000 Units by mouth daily., Disp: , Rfl:  .  Cyanocobalamin (B-12) 1000 MCG CAPS, Take 1 tablet by mouth daily. , Disp: , Rfl:  .  famotidine (PEPCID) 20 MG tablet, Take 20 mg by mouth as needed for  heartburn or indigestion., Disp: , Rfl:  .  fluticasone (FLONASE) 50 MCG/ACT nasal spray, Place 2 sprays into both nostrils as needed. , Disp: , Rfl:  .  metoprolol succinate (TOPROL-XL) 25 MG 24 hr tablet, Take 0.5 tablets (12.5 mg total) by mouth daily., Disp: 45 tablet, Rfl: 3 .  naproxen sodium (ANAPROX) 220 MG tablet, Take 220 mg by mouth daily as needed (pain). , Disp: , Rfl:  .  psyllium (TGT PSYLLIUM FIBER) 0.52 g capsule, Take 0.52 g by mouth 3 (three) times daily. , Disp: , Rfl:  .  SIMETHICONE-80 PO, Take 80 mg by mouth as needed (gas). , Disp: , Rfl:  .  tamoxifen (NOLVADEX) 20 MG tablet, TAKE 1 TABLET BY MOUTH EVERY DAY, Disp: 90 tablet, Rfl: 3  Past Medical Problems: Past Medical History:  Diagnosis Date  . Arthritis   . Breast cancer ([place])    Left DCIS  . Cancer ([place])   . Complication of anesthesia   . Dysrhythmia   . Family history of breast cancer   . Gastric reflux 90's  . GERD (gastroesophageal reflux disease)   . Headache    H/O MIGRAINES  . Hiatal hernia 2003  . History of Coronary CT for Calcium Scoring    a. 11/2018 CT Cor Ca2+ = Zero.  Marland Kitchen Hypertension   . Mitral regurgitation    a. 03/2017 Echo: EF 55-60%, no nrwma, Mod MR; b. 03/2018 Echo: EF 60-65%, no rwma. Gr2 DD. Mild  MV prolapse of ant leaflet w/ mild to mod mR. Nl RV fxn. Mild to mod TR.  Marland Kitchen Personal history of radiation therapy 2019   LEFT lumpectomy  . PONV (postoperative nausea and vomiting)    N/V  . PSVT (paroxysmal supraventricular tachycardia) ([place])    a. 03/2017 Event Monitor: Avg HR 66, 10 episodes of SVT up to 42.8 secs, fastest 148 bpm. 8 beats WCT (179 bpm) - VT vs SVT w/ aberrancy. Rare PACs (2.6% total beats).    Past Surgical History: Past Surgical History:  Procedure Laterality Date  . APPENDECTOMY    . BREAST BIOPSY Left 06/13/2018   Affirm Bx- coil clip   atypical sclerosing and DCIS  . BREAST EXCISIONAL BIOPSY Right 2000   benign  . BREAST LUMPECTOMY Left 06/26/2018   atypical  sclerosing lesion and DCIS bracketing  . BREAST LUMPECTOMY WITH NEEDLE LOCALIZATION Left 06/26/2018   Procedure: BREAST LUMPECTOMY WITH NEEDLE LOCALIZATION;  Surgeon: Jules Husbands, MD;  Location: [facility] ORS;  Service: General;  Laterality: Left;  . CHOLECYSTECTOMY    . OVARIAN CYST SURGERY    . TONSILLECTOMY     age 76  . UPPER GI ENDOSCOPY  2003    Social History: Social History   Socioeconomic History  . Marital status: Married    Spouse name: Not on file  . Number of children: Not on file  . Years of education: Not on file  . Highest education level: Not on file  Occupational History  . Not on file  Tobacco Use  . Smoking status: Never Smoker  . Smokeless tobacco: Never Used  Substance and Sexual Activity  . Alcohol use: Yes    Alcohol/week: 2.0 standard drinks    Types: 2 Glasses of wine per week    Comment: daily WINE  . Drug use: No  . Sexual activity: Not Currently  Other Topics Concern  . Not on file  Social History Narrative   She does have a living will.   Desires CPR   Social Determinants of Health   Financial Resource Strain:   . Difficulty of Paying Living Expenses: Not on file  Food Insecurity:   . Worried About Charity fundraiser in the Last Year: Not on file  . Ran Out of Food in the Last Year: Not on file  Transportation Needs:   . Lack of Transportation (Medical): Not on file  . Lack of Transportation (Non-Medical): Not on file  Physical Activity:   . Days of Exercise per Week: Not on file  . Minutes of Exercise per Session: Not on file  Stress:   . Feeling of Stress : Not on file  Social Connections:   . Frequency of Communication with Friends and Family: Not on file  . Frequency of Social Gatherings with Friends and Family: Not on file  . Attends Religious Services: Not on file  . Active Member of Clubs or Organizations: Not on file  . Attends Archivist Meetings: Not on file  . Marital Status: Not on file  Intimate Partner  Violence:   . Fear of Current or Ex-Partner: Not on file  . Emotionally Abused: Not on file  . Physically Abused: Not on file  . Sexually Abused: Not on file    Family History: Family History  Problem Relation Age of Onset  . Hyperlipidemia Mother   . Hypertension Mother   . COPD Mother   . Heart failure Mother  deceased 45  . Stroke Father        deceased 65  . Hypertension Father   . Hypertension Sister   . Hypertension Brother   . Healthy Brother   . Breast cancer Cousin 7       daughter of paternal aunt; currently 69  . Breast cancer Cousin 17       daughter of paternal uncle; currently 81  . Breast cancer Cousin 62       daughter of maternal uncle; deceased 39  . Testicular cancer Other 73       sister's son; currently 45  . Melanoma Paternal Uncle   . Breast cancer Cousin 61       daughter of same maternal uncle; currently 89    Review of Systems: Review of Systems  Constitutional: Negative.   Respiratory: Negative.   Cardiovascular: Negative.   Gastrointestinal: Negative.   Genitourinary: Negative.   Musculoskeletal: Negative.   Skin: Negative.   Neurological: Negative.     Physical Exam: Vital Signs BP (!) 155/83 (BP Location: Right Arm, Patient Position: Sitting, Cuff Size: Normal)   Pulse 61   Temp (!) 97.5 F (36.4 C) (Temporal)   Ht 5\' 8"  (1.727 m)   Wt 151 lb (68.5 kg)   SpO2 98%   BMI 22.96 kg/m  Physical Exam Exam conducted with a chaperone present.  Constitutional:      General: She is not in acute distress.    Appearance: Normal appearance. She is not ill-appearing.  HENT:     Head: Normocephalic and atraumatic.  Eyes:     Pupils: Pupils are equal, round Neck:     Musculoskeletal: Normal range of motion.  Cardiovascular:     Rate and Rhythm: Normal rate and regular rhythm.     Pulses: Normal pulses.     Heart sounds: Normal heart sounds. No murmur.  Pulmonary:     Effort: Pulmonary effort is normal. No respiratory  distress.     Breath sounds: Normal breath sounds. No wheezing.  Abdominal:     General: Abdomen is flat. There is no distension.     Palpations: Abdomen is soft.     Tenderness: There is no abdominal tenderness.  Musculoskeletal: Normal range of motion.  Skin:    General: Skin is warm and dry.     Findings: No erythema or rash.  Neurological:     General: No focal deficit present.     Mental Status: She is alert and oriented to person, place, and time. Mental status is at baseline.     Motor: No weakness.  Psychiatric:        Mood and Affect: Mood normal.        Behavior: Behavior normal.   Assessment/Plan: The patient is scheduled for mammoplasty augmentation with prostesis bilaterally (silicone implant), possible mastopexy to right breast, liposuction with lipofilling bilaterally, possible scar revision to left breast., scheduled for 01/17/20 with Dr. Mingo Amber.  We thoroughly covered the Risks, benefits, and alternatives of procedures discussed, questions answered and consent obtained.   Covid test scheduled. Rx sent to pharmacy.  Electronically signed by: Carola Rhine Dajana Gehrig, PA-C 01/08/2020 11:37 AM

## 2020-01-09 ENCOUNTER — Encounter: Payer: Self-pay | Admitting: Radiation Oncology

## 2020-01-09 ENCOUNTER — Ambulatory Visit
Admission: RE | Admit: 2020-01-09 | Discharge: 2020-01-09 | Disposition: A | Discharge status: Home / Self Care | Payer: PPO | Source: Ambulatory Visit | Attending: Radiation Oncology | Admitting: Radiation Oncology

## 2020-01-09 VITALS — BP 147/70 | HR 68 | Resp 16 | Wt 153.2 lb

## 2020-01-09 DIAGNOSIS — Z170 Estrogen receptor positive status [ER+]: Secondary | ICD-10-CM | POA: Insufficient documentation

## 2020-01-09 DIAGNOSIS — Z79810 Long term (current) use of selective estrogen receptor modulators (SERMs): Secondary | ICD-10-CM | POA: Diagnosis not present

## 2020-01-09 DIAGNOSIS — Z923 Personal history of irradiation: Secondary | ICD-10-CM | POA: Insufficient documentation

## 2020-01-09 DIAGNOSIS — D0512 Intraductal carcinoma in situ of left breast: Secondary | ICD-10-CM | POA: Diagnosis not present

## 2020-01-10 ENCOUNTER — Encounter (HOSPITAL_BASED_OUTPATIENT_CLINIC_OR_DEPARTMENT_OTHER): Payer: Self-pay | Admitting: Plastic Surgery

## 2020-01-10 ENCOUNTER — Other Ambulatory Visit: Payer: Self-pay

## 2020-01-10 NOTE — Progress Notes (Signed)
Radiation Oncology Follow up Note  Name: Susan [name]   Date:   01/09/2020 MRN:  [id number] DOB: 1951-02-18    This 69 y.o. female presents to the clinic today for 1 year follow-up status post whole breast radiation to her left breast for ER/PR positive ductal carcinoma in situ.  REFERRING PROVIDER: Lucille Passy, MD  HPI: Patient is a 69 year old female now at 1 year having completed whole breast radiation to her left breast for ER/PR positive ductal carcinoma in situ status post wide local excision seen today in routine follow-up she is doing fairly well she has had a poor cosmetic result secondary to a lumpectomy with retraction towards the scar she is having that corrected by plastic surgery in the next several months.  She otherwise specifically denies breast tenderness cough or bone pain..  She had mammograms back in June which I have reviewed were BI-RADS 2 benign.  She is currently on tamoxifen tolerating that well without side effect.  COMPLICATIONS OF TREATMENT: none  FOLLOW UP COMPLIANCE: keeps appointments   PHYSICAL EXAM:  BP (!) 147/70 (BP Location: Left Arm, Patient Position: Sitting)   Pulse 68   Resp 16   Wt 153 lb 3.2 oz (69.5 kg)   BMI 23.29 kg/m  There is retraction of the nipple with indentation of the wide local excision scar.  No dominant mass or nodularity is noted in either breast in 2 positions examined.  No axillary or supraclavicular adenopathy is appreciated.  Well-developed well-nourished patient in NAD. HEENT reveals PERLA, EOMI, discs not visualized.  Oral cavity is clear. No oral mucosal lesions are identified. Neck is clear without evidence of cervical or supraclavicular adenopathy. Lungs are clear to A&P. Cardiac examination is essentially unremarkable with regular rate and rhythm without murmur rub or thrill. Abdomen is benign with no organomegaly or masses noted. Motor sensory and DTR levels are equal and symmetric in the upper and lower extremities.  Cranial nerves II through XII are grossly intact. Proprioception is intact. No peripheral adenopathy or edema is identified. No motor or sensory levels are noted. Crude visual fields are within normal range.  RADIOLOGY RESULTS: Mammograms reviewed compatible with above-stated findings  PLAN: Present time patient is doing well with no evidence of disease now at 1 year.  She is going on undergoing plastic surgery to correct her wide local excision defect.  I have asked to see her back in 1 year for follow-up.  She continues on tamoxifen without side effect.  Patient knows to call with any concerns.  I would like to take this opportunity to thank you for allowing me to participate in the care of your patient.Noreene Filbert, MD

## 2020-01-14 ENCOUNTER — Other Ambulatory Visit (HOSPITAL_COMMUNITY)
Admission: RE | Admit: 2020-01-14 | Discharge: 2020-01-14 | Disposition: A | Discharge status: Home / Self Care | Payer: PPO | Source: Ambulatory Visit | Attending: Plastic Surgery | Admitting: Plastic Surgery

## 2020-01-14 DIAGNOSIS — Z20822 Contact with and (suspected) exposure to covid-19: Secondary | ICD-10-CM | POA: Insufficient documentation

## 2020-01-14 DIAGNOSIS — Z01812 Encounter for preprocedural laboratory examination: Secondary | ICD-10-CM | POA: Diagnosis not present

## 2020-01-15 LAB — NOVEL CORONAVIRUS, NAA (HOSP ORDER, SEND-OUT TO REF LAB; TAT 18-24 HRS): SARS-CoV-2, NAA: NOT DETECTED

## 2020-01-17 ENCOUNTER — Other Ambulatory Visit: Payer: Self-pay

## 2020-01-17 ENCOUNTER — Encounter (HOSPITAL_BASED_OUTPATIENT_CLINIC_OR_DEPARTMENT_OTHER)
Admission: RE | Disposition: A | Discharge status: Home / Self Care | Payer: Self-pay | Source: Home / Self Care | Attending: Plastic Surgery

## 2020-01-17 ENCOUNTER — Ambulatory Visit (HOSPITAL_BASED_OUTPATIENT_CLINIC_OR_DEPARTMENT_OTHER): Payer: PPO | Admitting: Certified Registered"

## 2020-01-17 ENCOUNTER — Ambulatory Visit (HOSPITAL_BASED_OUTPATIENT_CLINIC_OR_DEPARTMENT_OTHER)
Admission: RE | Admit: 2020-01-17 | Discharge: 2020-01-17 | Disposition: A | Discharge status: Home / Self Care | Payer: PPO | Attending: Plastic Surgery | Admitting: Plastic Surgery

## 2020-01-17 ENCOUNTER — Encounter (HOSPITAL_BASED_OUTPATIENT_CLINIC_OR_DEPARTMENT_OTHER): Payer: Self-pay | Admitting: Plastic Surgery

## 2020-01-17 DIAGNOSIS — N6489 Other specified disorders of breast: Secondary | ICD-10-CM | POA: Diagnosis not present

## 2020-01-17 DIAGNOSIS — C50919 Malignant neoplasm of unspecified site of unspecified female breast: Secondary | ICD-10-CM | POA: Diagnosis not present

## 2020-01-17 DIAGNOSIS — I472 Ventricular tachycardia: Secondary | ICD-10-CM | POA: Insufficient documentation

## 2020-01-17 DIAGNOSIS — Z79899 Other long term (current) drug therapy: Secondary | ICD-10-CM | POA: Diagnosis not present

## 2020-01-17 DIAGNOSIS — C50912 Malignant neoplasm of unspecified site of left female breast: Secondary | ICD-10-CM | POA: Diagnosis not present

## 2020-01-17 DIAGNOSIS — I10 Essential (primary) hypertension: Secondary | ICD-10-CM | POA: Insufficient documentation

## 2020-01-17 DIAGNOSIS — Z79810 Long term (current) use of selective estrogen receptor modulators (SERMs): Secondary | ICD-10-CM | POA: Insufficient documentation

## 2020-01-17 DIAGNOSIS — K219 Gastro-esophageal reflux disease without esophagitis: Secondary | ICD-10-CM | POA: Diagnosis not present

## 2020-01-17 DIAGNOSIS — D0512 Intraductal carcinoma in situ of left breast: Secondary | ICD-10-CM | POA: Diagnosis not present

## 2020-01-17 DIAGNOSIS — L905 Scar conditions and fibrosis of skin: Secondary | ICD-10-CM | POA: Diagnosis not present

## 2020-01-17 DIAGNOSIS — N6320 Unspecified lump in the left breast, unspecified quadrant: Secondary | ICD-10-CM | POA: Diagnosis not present

## 2020-01-17 DIAGNOSIS — Z421 Encounter for breast reconstruction following mastectomy: Secondary | ICD-10-CM | POA: Diagnosis not present

## 2020-01-17 DIAGNOSIS — R928 Other abnormal and inconclusive findings on diagnostic imaging of breast: Secondary | ICD-10-CM | POA: Diagnosis not present

## 2020-01-17 HISTORY — PX: SKIN FULL THICKNESS GRAFT: SHX442

## 2020-01-17 HISTORY — PX: SCAR REVISION: SHX5285

## 2020-01-17 HISTORY — PX: AUGMENTATION MAMMAPLASTY: SUR837

## 2020-01-17 HISTORY — PX: BREAST ENHANCEMENT SURGERY: SHX7

## 2020-01-17 SURGERY — MAMMOPLASTY AUGMENTATION WITH PROSTHESIS (BREAST)
Anesthesia: General | Site: Breast | Laterality: Right

## 2020-01-17 MED ORDER — PROPOFOL 10 MG/ML IV BOLUS
INTRAVENOUS | Status: DC | PRN
  Administered 2020-01-17: 150 mg via INTRAVENOUS

## 2020-01-17 MED ORDER — PROPOFOL 500 MG/50ML IV EMUL
INTRAVENOUS | Status: DC | PRN
  Administered 2020-01-17: 25 ug/kg/min via INTRAVENOUS

## 2020-01-17 MED ORDER — MIDAZOLAM HCL 2 MG/2ML IJ SOLN: 1.0000 mg | INTRAMUSCULAR | Status: DC | PRN

## 2020-01-17 MED ORDER — EPHEDRINE SULFATE-NACL 50-0.9 MG/10ML-% IV SOSY
PREFILLED_SYRINGE | INTRAVENOUS | Status: DC | PRN
  Administered 2020-01-17 (×4): 5 mg via INTRAVENOUS

## 2020-01-17 MED ORDER — LACTATED RINGERS IV SOLN: INTRAVENOUS | Status: DC

## 2020-01-17 MED ORDER — FENTANYL CITRATE (PF) 100 MCG/2ML IJ SOLN
INTRAMUSCULAR | Status: DC | PRN
  Administered 2020-01-17 (×4): 25 ug via INTRAVENOUS

## 2020-01-17 MED ORDER — DEXAMETHASONE SODIUM PHOSPHATE 10 MG/ML IJ SOLN
INTRAMUSCULAR | Status: AC
  Filled 2020-01-17: qty 1

## 2020-01-17 MED ORDER — ONDANSETRON HCL 4 MG/2ML IJ SOLN
INTRAMUSCULAR | Status: AC
  Filled 2020-01-17: qty 2

## 2020-01-17 MED ORDER — CEFAZOLIN SODIUM-DEXTROSE 2-4 GM/100ML-% IV SOLN
INTRAVENOUS | Status: AC
  Filled 2020-01-17: qty 100

## 2020-01-17 MED ORDER — OXYCODONE HCL 5 MG/5ML PO SOLN: 5.0000 mg | Freq: Once | ORAL | Status: AC | PRN

## 2020-01-17 MED ORDER — LIDOCAINE-EPINEPHRINE 1 %-1:100000 IJ SOLN: INTRAMUSCULAR | Status: DC | PRN

## 2020-01-17 MED ORDER — BUPIVACAINE HCL (PF) 0.5 % IJ SOLN
INTRAMUSCULAR | Status: AC
  Filled 2020-01-17: qty 60

## 2020-01-17 MED ORDER — FENTANYL CITRATE (PF) 100 MCG/2ML IJ SOLN
25.0000 ug | INTRAMUSCULAR | Status: DC | PRN
  Administered 2020-01-17: 50 ug via INTRAVENOUS

## 2020-01-17 MED ORDER — DEXAMETHASONE SODIUM PHOSPHATE 10 MG/ML IJ SOLN
INTRAMUSCULAR | Status: DC | PRN
  Administered 2020-01-17: 6 mg via INTRAVENOUS

## 2020-01-17 MED ORDER — CEFAZOLIN SODIUM-DEXTROSE 2-4 GM/100ML-% IV SOLN
2.0000 g | INTRAVENOUS | Status: AC
  Administered 2020-01-17: 2 g via INTRAVENOUS

## 2020-01-17 MED ORDER — LIDOCAINE HCL 2 % IJ SOLN
INTRAMUSCULAR | Status: AC
  Filled 2020-01-17: qty 80

## 2020-01-17 MED ORDER — LIDOCAINE HCL (PF) 1 % IJ SOLN
INTRAMUSCULAR | Status: AC
  Filled 2020-01-17: qty 30

## 2020-01-17 MED ORDER — EPINEPHRINE PF 1 MG/ML IJ SOLN
INTRAMUSCULAR | Status: AC
  Filled 2020-01-17: qty 2

## 2020-01-17 MED ORDER — ONDANSETRON HCL 4 MG/2ML IJ SOLN
INTRAMUSCULAR | Status: DC | PRN
  Administered 2020-01-17: 4 mg via INTRAVENOUS

## 2020-01-17 MED ORDER — ONDANSETRON HCL 4 MG/2ML IJ SOLN: 4.0000 mg | Freq: Once | INTRAMUSCULAR | Status: DC | PRN

## 2020-01-17 MED ORDER — SODIUM CHLORIDE 0.9 % IV SOLN: INTRAVENOUS | Status: DC | PRN

## 2020-01-17 MED ORDER — PROPOFOL 10 MG/ML IV BOLUS
INTRAVENOUS | Status: AC
  Filled 2020-01-17: qty 20

## 2020-01-17 MED ORDER — BUPIVACAINE HCL (PF) 0.5 % IJ SOLN
INTRAMUSCULAR | Status: AC
  Filled 2020-01-17: qty 30

## 2020-01-17 MED ORDER — BUPIVACAINE HCL (PF) 0.25 % IJ SOLN
INTRAMUSCULAR | Status: AC
  Filled 2020-01-17: qty 30

## 2020-01-17 MED ORDER — FENTANYL CITRATE (PF) 100 MCG/2ML IJ SOLN
INTRAMUSCULAR | Status: AC
  Filled 2020-01-17: qty 2

## 2020-01-17 MED ORDER — FENTANYL CITRATE (PF) 100 MCG/2ML IJ SOLN: 50.0000 ug | INTRAMUSCULAR | Status: DC | PRN

## 2020-01-17 MED ORDER — SODIUM BICARBONATE 4 % IV SOLN
INTRAVENOUS | Status: DC | PRN
  Administered 2020-01-17: 20 mL via INTRAMUSCULAR

## 2020-01-17 MED ORDER — OXYCODONE HCL 5 MG PO TABS
5.0000 mg | ORAL_TABLET | Freq: Once | ORAL | Status: AC | PRN
  Administered 2020-01-17: 5 mg via ORAL

## 2020-01-17 MED ORDER — OXYCODONE HCL 5 MG PO TABS
ORAL_TABLET | ORAL | Status: AC
  Filled 2020-01-17: qty 1

## 2020-01-17 MED ORDER — LIDOCAINE HCL (CARDIAC) PF 100 MG/5ML IV SOSY
PREFILLED_SYRINGE | INTRAVENOUS | Status: DC | PRN
  Administered 2020-01-17: 60 mg via INTRAVENOUS

## 2020-01-17 MED ORDER — BUPIVACAINE HCL (PF) 0.25 % IJ SOLN
INTRAMUSCULAR | Status: AC
  Filled 2020-01-17: qty 60

## 2020-01-17 MED ORDER — LIDOCAINE-EPINEPHRINE 2 %-1:100000 IJ SOLN
INTRAMUSCULAR | Status: AC
  Filled 2020-01-17: qty 3

## 2020-01-17 MED ORDER — LIDOCAINE 2% (20 MG/ML) 5 ML SYRINGE
INTRAMUSCULAR | Status: AC
  Filled 2020-01-17: qty 5

## 2020-01-17 SURGICAL SUPPLY — 143 items
BAG DECANTER FOR FLEXI CONT (MISCELLANEOUS) ×6 IMPLANT
BINDER ABDOMINAL 10 UNV 27-48 (MISCELLANEOUS) IMPLANT
BINDER ABDOMINAL 12 SM 30-45 (SOFTGOODS) IMPLANT
BINDER BREAST LRG (GAUZE/BANDAGES/DRESSINGS) IMPLANT
BINDER BREAST MEDIUM (GAUZE/BANDAGES/DRESSINGS) IMPLANT
BINDER BREAST XLRG (GAUZE/BANDAGES/DRESSINGS) IMPLANT
BINDER BREAST XXLRG (GAUZE/BANDAGES/DRESSINGS) IMPLANT
BIOPATCH RED 1 DISK 7.0 (GAUZE/BANDAGES/DRESSINGS) IMPLANT
BIOPATCH RED 1IN DISK 7.0MM (GAUZE/BANDAGES/DRESSINGS)
BLADE CLIPPER SURG (BLADE) IMPLANT
BLADE SURG 10 STRL SS (BLADE) ×6 IMPLANT
BLADE SURG 11 STRL SS (BLADE) ×6 IMPLANT
BLADE SURG 15 STRL LF DISP TIS (BLADE) ×4 IMPLANT
BLADE SURG 15 STRL SS (BLADE) ×6
BNDG CONFORM 2 STRL LF (GAUZE/BANDAGES/DRESSINGS) IMPLANT
BNDG ELASTIC 2X5.8 VLCR STR LF (GAUZE/BANDAGES/DRESSINGS) IMPLANT
BNDG ELASTIC 6X5.8 VLCR STR LF (GAUZE/BANDAGES/DRESSINGS) ×6 IMPLANT
BNDG GAUZE ELAST 4 BULKY (GAUZE/BANDAGES/DRESSINGS) IMPLANT
CANISTER LIPO FAT HARVEST (MISCELLANEOUS) IMPLANT
CANISTER SUCT 1200ML W/VALVE (MISCELLANEOUS) ×6 IMPLANT
CHLORAPREP W/TINT 26 (MISCELLANEOUS) ×6 IMPLANT
CLOSURE STERI-STRIP 1/2X4 (GAUZE/BANDAGES/DRESSINGS)
CLOSURE WOUND 1/2 X4 (GAUZE/BANDAGES/DRESSINGS) ×1
CLSR STERI-STRIP ANTIMIC 1/2X4 (GAUZE/BANDAGES/DRESSINGS) IMPLANT
CORD BIPOLAR FORCEPS 12FT (ELECTRODE) IMPLANT
COVER BACK TABLE 60X90IN (DRAPES) ×6 IMPLANT
COVER MAYO STAND STRL (DRAPES) ×12 IMPLANT
COVER WAND RF STERILE (DRAPES) IMPLANT
DECANTER SPIKE VIAL GLASS SM (MISCELLANEOUS) IMPLANT
DERMABOND ADVANCED (GAUZE/BANDAGES/DRESSINGS)
DERMABOND ADVANCED .7 DNX12 (GAUZE/BANDAGES/DRESSINGS) IMPLANT
DRAIN CHANNEL 15F RND FF W/TCR (WOUND CARE) ×6 IMPLANT
DRAPE HALF SHEET 70X43 (DRAPES) ×12 IMPLANT
DRAPE LAPAROSCOPIC ABDOMINAL (DRAPES) IMPLANT
DRAPE LAPAROTOMY 100X72 PEDS (DRAPES) IMPLANT
DRAPE TOP ARMCOVERS (MISCELLANEOUS) ×6 IMPLANT
DRAPE U-SHAPE 76X120 STRL (DRAPES) ×6 IMPLANT
DRAPE UTILITY XL STRL (DRAPES) ×6 IMPLANT
DRSG ADAPTIC 3X8 NADH LF (GAUZE/BANDAGES/DRESSINGS) IMPLANT
DRSG EMULSION OIL 3X3 NADH (GAUZE/BANDAGES/DRESSINGS) IMPLANT
DRSG PAD ABDOMINAL 8X10 ST (GAUZE/BANDAGES/DRESSINGS) ×12 IMPLANT
DRSG TEGADERM 4X10 (GAUZE/BANDAGES/DRESSINGS) IMPLANT
DRSG TEGADERM 4X4.75 (GAUZE/BANDAGES/DRESSINGS) IMPLANT
ELECT BLADE 4.0 EZ CLEAN MEGAD (MISCELLANEOUS)
ELECT COATED BLADE 2.86 ST (ELECTRODE) ×6 IMPLANT
ELECT NEEDLE BLADE 2-5/6 (NEEDLE) ×6 IMPLANT
ELECT REM PT RETURN 9FT ADLT (ELECTROSURGICAL) ×6
ELECT REM PT RETURN 9FT PED (ELECTROSURGICAL)
ELECTRODE BLDE 4.0 EZ CLN MEGD (MISCELLANEOUS) IMPLANT
ELECTRODE REM PT RETRN 9FT PED (ELECTROSURGICAL) IMPLANT
ELECTRODE REM PT RTRN 9FT ADLT (ELECTROSURGICAL) ×4 IMPLANT
EVACUATOR SILICONE 100CC (DRAIN) ×6 IMPLANT
EXTRACTOR CANIST REVOLVE STRL (CANNISTER) IMPLANT
GAUZE SPONGE 4X4 12PLY STRL LF (GAUZE/BANDAGES/DRESSINGS) IMPLANT
GAUZE XEROFORM 1X8 LF (GAUZE/BANDAGES/DRESSINGS) IMPLANT
GAUZE XEROFORM 5X9 LF (GAUZE/BANDAGES/DRESSINGS) IMPLANT
GLOVE BIO SURGEON STRL SZ7 (GLOVE) ×6 IMPLANT
GLOVE BIOGEL M STRL SZ7.5 (GLOVE) ×24 IMPLANT
GLOVE BIOGEL PI IND STRL 8 (GLOVE) ×4 IMPLANT
GLOVE BIOGEL PI INDICATOR 8 (GLOVE) ×2
GOWN STRL REUS W/ TWL LRG LVL3 (GOWN DISPOSABLE) ×8 IMPLANT
GOWN STRL REUS W/TWL LRG LVL3 (GOWN DISPOSABLE) ×12
IMPL BREAST MP 130CC (Breast) ×4 IMPLANT
IMPL BREAST MP 160CC (Breast) ×4 IMPLANT
IMPLANT BREAST MP 130CC (Breast) ×6 IMPLANT
IMPLANT BREAST MP 160CC (Breast) ×6 IMPLANT
IV NS 500ML (IV SOLUTION) ×6
IV NS 500ML BAXH (IV SOLUTION) ×4 IMPLANT
KIT FILL SYSTEM UNIVERSAL (SET/KITS/TRAYS/PACK) IMPLANT
LINER CANISTER 1000CC FLEX (MISCELLANEOUS) ×6 IMPLANT
MARKER SKIN DUAL TIP RULER LAB (MISCELLANEOUS) IMPLANT
NDL SAFETY ECLIPSE 18X1.5 (NEEDLE) ×4 IMPLANT
NEEDLE HYPO 18GX1.5 SHARP (NEEDLE) ×6
NEEDLE HYPO 25X1 1.5 SAFETY (NEEDLE) IMPLANT
NEEDLE HYPO 30GX1 BEV (NEEDLE) IMPLANT
NEEDLE PRECISIONGLIDE 27X1.5 (NEEDLE) IMPLANT
NS IRRIG 1000ML POUR BTL (IV SOLUTION) IMPLANT
PACK BASIN DAY SURGERY FS (CUSTOM PROCEDURE TRAY) ×6 IMPLANT
PAD ALCOHOL SWAB (MISCELLANEOUS) ×6 IMPLANT
PENCIL SMOKE EVACUATOR (MISCELLANEOUS) ×6 IMPLANT
PIN SAFETY STERILE (MISCELLANEOUS) ×6 IMPLANT
RETRACTOR ONETRAX LX 135X30 (MISCELLANEOUS) ×6 IMPLANT
SIZER BREAST REUSABLE 130CC (SIZER) ×6
SIZER BREAST REUSABLE 160CC (SIZER) ×12
SIZER BREAST REUSABLE 190CC (SIZER) ×6
SIZER BRST REUSABLE 130CC (SIZER) ×4 IMPLANT
SIZER BRST REUSABLE 160CC (SIZER) ×8 IMPLANT
SIZER BRST REUSABLE 190CC (SIZER) ×4 IMPLANT
SLEEVE SCD COMPRESS KNEE MED (MISCELLANEOUS) ×6 IMPLANT
SPONGE GAUZE 2X2 8PLY STER LF (GAUZE/BANDAGES/DRESSINGS)
SPONGE GAUZE 2X2 8PLY STRL LF (GAUZE/BANDAGES/DRESSINGS) IMPLANT
SPONGE LAP 18X18 RF (DISPOSABLE) ×12 IMPLANT
STAPLER VISISTAT 35W (STAPLE) ×6 IMPLANT
STRIP CLOSURE SKIN 1/2X4 (GAUZE/BANDAGES/DRESSINGS) ×5 IMPLANT
STRIP SUTURE WOUND CLOSURE 1/2 (MISCELLANEOUS) IMPLANT
SUCTION FRAZIER HANDLE 10FR (MISCELLANEOUS)
SUCTION TUBE FRAZIER 10FR DISP (MISCELLANEOUS) IMPLANT
SUT CHROMIC 4 0 P 3 18 (SUTURE) IMPLANT
SUT CHROMIC 5 0 P 3 (SUTURE) IMPLANT
SUT ETHILON 2 0 FS 18 (SUTURE) IMPLANT
SUT ETHILON 4 0 P 3 18 (SUTURE) IMPLANT
SUT ETHILON 4 0 PS 2 18 (SUTURE) IMPLANT
SUT ETHILON 5 0 P 3 18 (SUTURE)
SUT MNCRL 6-0 UNDY P1 1X18 (SUTURE) IMPLANT
SUT MNCRL AB 3-0 PS2 18 (SUTURE) ×6 IMPLANT
SUT MNCRL AB 4-0 PS2 18 (SUTURE) IMPLANT
SUT MON AB 3-0 SH 27 (SUTURE) ×6
SUT MON AB 3-0 SH27 (SUTURE) ×4 IMPLANT
SUT MON AB 4-0 PC3 18 (SUTURE) ×6 IMPLANT
SUT MON AB 5-0 P3 18 (SUTURE) IMPLANT
SUT MONOCRYL 6-0 P1 1X18 (SUTURE)
SUT NYLON ETHILON 5-0 P-3 1X18 (SUTURE) IMPLANT
SUT PDS 3-0 CT2 (SUTURE) ×6
SUT PDS II 3-0 CT2 27 ABS (SUTURE) ×4 IMPLANT
SUT PLAIN 5 0 P 3 18 (SUTURE) ×6 IMPLANT
SUT PROLENE 4 0 PS 2 18 (SUTURE) IMPLANT
SUT VIC AB 3-0 PS1 18 (SUTURE)
SUT VIC AB 3-0 PS1 18XBRD (SUTURE) IMPLANT
SUT VIC AB 3-0 SH 27 (SUTURE)
SUT VIC AB 3-0 SH 27X BRD (SUTURE) IMPLANT
SUT VIC AB 5-0 P-3 18X BRD (SUTURE) IMPLANT
SUT VIC AB 5-0 P3 18 (SUTURE)
SUT VICRYL 4-0 PS2 18IN ABS (SUTURE) IMPLANT
SUT VICRYL 6 0 P 1 18 (SUTURE) IMPLANT
SUT VLOC 90 P-14 23 (SUTURE) ×12 IMPLANT
SYR 10ML LL (SYRINGE) ×18 IMPLANT
SYR 50ML LL SCALE MARK (SYRINGE) ×6 IMPLANT
SYR BULB 3OZ (MISCELLANEOUS) IMPLANT
SYR BULB IRRIGATION 50ML (SYRINGE) ×6 IMPLANT
SYR CONTROL 10ML LL (SYRINGE) ×6 IMPLANT
SYR TB 1ML LL NO SAFETY (SYRINGE) ×6 IMPLANT
SYR TOOMEY IRRIG 70ML (MISCELLANEOUS)
SYRINGE TOOMEY IRRIG 70ML (MISCELLANEOUS) IMPLANT
TAPE MEASURE VINYL STERILE (MISCELLANEOUS) IMPLANT
TOWEL GREEN STERILE FF (TOWEL DISPOSABLE) ×12 IMPLANT
TRAY DSU PREP LF (CUSTOM PROCEDURE TRAY) IMPLANT
TRAY FOLEY W/BAG SLVR 14FR LF (SET/KITS/TRAYS/PACK) IMPLANT
TUBE CONNECTING 20'X1/4 (TUBING) ×1
TUBE CONNECTING 20X1/4 (TUBING) ×5 IMPLANT
TUBING INFILTRATION IT-10001 (TUBING) IMPLANT
TUBING SET GRADUATE ASPIR 12FT (MISCELLANEOUS) ×6 IMPLANT
UNDERPAD 30X36 HEAVY ABSORB (UNDERPADS AND DIAPERS) ×12 IMPLANT
YANKAUER SUCT BULB TIP NO VENT (SUCTIONS) ×6 IMPLANT

## 2020-01-17 NOTE — Transfer of Care (Signed)
Immediate Anesthesia Transfer of Care Note  Patient: Susan [name]  Procedure(s) Performed: MAMMOPLASTY AUGMENTATION WITH PROSTHESIS (BREAST) (Bilateral Breast) POSSIBLE SCAR REVISION TO LEFT BREAST (Left Breast) SKIN GRAFT FULL THICKNESS (Breast)  Patient Location: PACU  Anesthesia Type:General  Level of Consciousness: awake, alert , oriented, drowsy and patient cooperative  Airway & Oxygen Therapy: Patient Spontanous Breathing and Patient connected to nasal cannula oxygen  Post-op Assessment: Report given to RN and Post -op Vital signs reviewed and stable  Post vital signs: Reviewed and stable  Last Vitals:  Vitals Value Taken Time  BP    Temp    Pulse    Resp    SpO2      Last Pain:  Vitals:   01/17/20 0651  TempSrc: Oral  PainSc: 0-No pain      Patients Stated Pain Goal: 3 ([id number])  Complications: No apparent anesthesia complications

## 2020-01-17 NOTE — Anesthesia Procedure Notes (Signed)
Procedure Name: LMA Insertion Date/Time: 01/17/2020 7:31 AM Performed by: Raenette Rover, CRNA Pre-anesthesia Checklist: Patient identified, Emergency Drugs available, Suction available and Patient being monitored Patient Re-evaluated:Patient Re-evaluated prior to induction Oxygen Delivery Method: Circle system utilized Preoxygenation: Pre-oxygenation with 100% oxygen Induction Type: IV induction LMA: LMA inserted LMA Size: 4.0 Number of attempts: 1 Placement Confirmation: positive ETCO2 and breath sounds checked- equal and bilateral Tube secured with: Tape Dental Injury: Teeth and Oropharynx as per pre-operative assessment

## 2020-01-17 NOTE — Interval H&P Note (Signed)
History and Physical Interval Note:  01/17/2020 7:02 AM  Susan [name]  has presented today for surgery, with the diagnosis of Malignant Neoplasm Of Female Breast, Unspecified Estrogen Receptor Status, Unspecified Laterality, Unspecified Site Of Breast.  The various methods of treatment have been discussed with the patient and family. After consideration of risks, benefits and other options for treatment, the patient has consented to  Procedure(s) with comments: MAMMOPLASTY AUGMENTATION WITH PROSTHESIS (BREAST) (Bilateral) - 2.5 hours for total case POSSIBLE MASTOPEXY TO RIGHT BREAST (Right) LIPOSUCTION WITH LIPOFILLING (Bilateral) POSSIBLE SCAR REVISION TO LEFT BREAST (Left) as a surgical intervention.  The patient's history has been reviewed, patient examined, no change in status, stable for surgery.  I have reviewed the patient's chart and labs.  Questions were answered to the patient's satisfaction.     Cindra Presume

## 2020-01-17 NOTE — Discharge Instructions (Signed)
Activity As tolerated: NO showers for 2 days No heavy activities  Diet: Regular  Wound Care: Keep dressing clean & dry for 2 days.  Keep wrap applied with compression as much as possible.    Do not change dressings for 2 days unless soiled.  Can change if needed but make sure to reapply wrap. After two days can remove wrap and shower.  Then reapply dressings if needed and continue compression with wrap or soft sports bra. Call doctor if any unusual problems occur such as pain, excessive bleeding, unrelieved nausea/vomiting, fever &/or chills  Follow-up appointment: Scheduled for next week.   Post Anesthesia Home Care Instructions  Activity: Get plenty of rest for the remainder of the day. A responsible individual must stay with you for 24 hours following the procedure.  For the next 24 hours, DO NOT: -Drive a car -Operate machinery -Drink alcoholic beverages -Take any medication unless instructed by your physician -Make any legal decisions or sign important papers.  Meals: Start with liquid foods such as gelatin or soup. Progress to regular foods as tolerated. Avoid greasy, spicy, heavy foods. If nausea and/or vomiting occur, drink only clear liquids until the nausea and/or vomiting subsides. Call your physician if vomiting continues.  Special Instructions/Symptoms: Your throat may feel dry or sore from the anesthesia or the breathing tube placed in your throat during surgery. If this causes discomfort, gargle with warm salt water. The discomfort should disappear within 24 hours.  If you had a scopolamine patch placed behind your ear for the management of post- operative nausea and/or vomiting:  1. The medication in the patch is effective for 72 hours, after which it should be removed.  Wrap patch in a tissue and discard in the trash. Wash hands thoroughly with soap and water. 2. You may remove the patch earlier than 72 hours if you experience unpleasant side effects which may  include dry mouth, dizziness or visual disturbances. 3. Avoid touching the patch. Wash your hands with soap and water after contact with the patch.      

## 2020-01-17 NOTE — Brief Op Note (Signed)
01/17/2020  9:38 AM  PATIENT:  Susan [name]  69 y.o. 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, Unspecified Estrogen   PROCEDURE:  Procedure(s) with comments: MAMMOPLASTY AUGMENTATION WITH PROSTHESIS (BREAST) (Bilateral) - 2.5 hours for total case POSSIBLE SCAR REVISION TO LEFT BREAST (Left) SKIN GRAFT FULL THICKNESS  SURGEON:  Surgeon(s) and Role:    * Ardyth Kelso, Steffanie Dunn, MD - Primary  PHYSICIAN ASSISTANT: Software engineer, PA  ASSISTANTS: none   ANESTHESIA:   general  EBL:  20 mL   BLOOD ADMINISTERED:none  DRAINS: none   LOCAL MEDICATIONS USED:  MARCAINE     SPECIMEN:  No Specimen  DISPOSITION OF SPECIMEN:  N/A  COUNTS:  YES  TOURNIQUET:  * No tourniquets in log *  DICTATION: .Dragon Dictation  PLAN OF CARE: Discharge to home after PACU  PATIENT DISPOSITION:  PACU - hemodynamically stable.   Delay start of Pharmacological VTE agent (>24hrs) due to surgical blood loss or risk of bleeding: not applicable

## 2020-01-17 NOTE — Anesthesia Preprocedure Evaluation (Addendum)
Anesthesia Evaluation  Patient identified by MRN, date of birth, ID band Patient awake    Reviewed: Allergy & Precautions, NPO status , Patient's Chart, lab work & pertinent test results, reviewed documented beta blocker date and time   History of Anesthesia Complications (+) PONV and history of anesthetic complications  Airway Mallampati: III  TM Distance: >3 FB Neck ROM: Full    Dental  (+) Dental Advisory Given, Teeth Intact   Pulmonary neg pulmonary ROS,    Pulmonary exam normal        Cardiovascular hypertension, Pt. on home beta blockers and Pt. on medications + CAD  Normal cardiovascular exam+ dysrhythmias Supra Ventricular Tachycardia + Valvular Problems/Murmurs MR    '19 TTE - EF 60% to 65%. Grade 2 diastolic dysfunction. Suspect mild prolapse of the MV anterior leaflet with mild-mod MR. LA was at the upper limits of normal in size. Mild-mod TR.    Neuro/Psych  Headaches, negative psych ROS   GI/Hepatic Neg liver ROS, hiatal hernia, GERD  Medicated and Controlled,  Endo/Other  negative endocrine ROS  Renal/GU negative Renal ROS     Musculoskeletal  (+) Arthritis ,   Abdominal   Peds  Hematology negative hematology ROS (+)   Anesthesia Other Findings Covid 1/19  Reproductive/Obstetrics  Breast cancer                             Anesthesia Physical Anesthesia Plan  ASA: III  Anesthesia Plan: General   Post-op Pain Management:    Induction: Intravenous  PONV Risk Score and Plan: 4 or greater and Treatment may vary due to age or medical condition, Ondansetron, Dexamethasone and Propofol infusion  Airway Management Planned: LMA  Additional Equipment: None  Intra-op Plan:   Post-operative Plan: Extubation in OR  Informed Consent: I have reviewed the patients History and Physical, chart, labs and discussed the procedure including the risks, benefits and alternatives  for the proposed anesthesia with the patient or authorized representative who has indicated his/her understanding and acceptance.     Dental advisory given  Plan Discussed with: CRNA and Anesthesiologist  Anesthesia Plan Comments:       Anesthesia Quick Evaluation

## 2020-01-17 NOTE — Op Note (Signed)
Operative Note   DATE OF OPERATION: 01/17/2020  SURGICAL DEPARTMENT: Plastic Surgery  PREOPERATIVE DIAGNOSES:  Left breast lumpectomy defect with deformed nipple areolar complex and right breast asymmetry  POSTOPERATIVE DIAGNOSES:  same  PROCEDURE: 1.  Placement of bilateral breast implants 2.  Excision of left nipple areolar complex scar measuring 2 cm 3.  Buried dermal fat graft from the abdomen to the left breast to improve the contour the nipple areolar complex 4. Complex closure of the nipple areolar complex measuring 2cm   SURGEON: Talmadge Coventry, MD  ASSISTANT: Verdie Shire, PA The advanced practice practitioner (APP) assisted throughout the case.  The APP was essential in retraction and counter traction when needed to make the case progress smoothly.  This retraction and assistance made it possible to see the tissue plans for the procedure.  The assistance was needed for blood control, tissue re-approximation and assisted with closure of the incision site.  ANESTHESIA:  General.   COMPLICATIONS: None.   INDICATIONS FOR PROCEDURE:  The patient, Susan [name] is a 69 y.o. female born on 01/31/1951, is here for treatment of left breast lumpectomy defect and right breast asymmetry MRN: [id number]  CONSENT:  Informed consent was obtained directly from the patient. Risks, benefits and alternatives were fully discussed. Specific risks including but not limited to bleeding, infection, hematoma, seroma, scarring, pain, contracture, asymmetry, wound healing problems, and need for further surgery were all discussed. The patient did have an ample opportunity to have questions answered to satisfaction.   DESCRIPTION OF PROCEDURE:  The patient was taken to the operating room. SCDs were placed and Ancef antibiotics were given.  General anesthesia was administered.  The patient's operative site was prepped and draped in a sterile fashion. A time out was performed and all information was  confirmed to be correct.  I started on the left side.  Tumescent with Marcaine and epinephrine was infiltrated circumferentially and at the spot of the incision.  I then made an inframammary incision 7 cm in length.  I dissected down to the pectoralis major with cautery.  The breast was lifted off the pectoralis major for few centimeters superiorly.  The inferior attachments of the pectoralis major were then divided with cautery and the subpectoral pocket was developed.  Hemostasis was obtained and a sterile gel sizer was placed in the pocket.  I then turned my attention to the nipple areolar complex.  She had a severely depressed scar in the inferomedial quadrant of the areola.  I excised this with a 15 blade.  I then developed a surrounding subcutaneous plane with a 15 blade.  I then harvested a de-epithelialized dermal fat graft from the left abdomen and placed this in the subcutaneous pocket around the nipple areolar complex.  This filled out the pocket and released the scar and depression.  The area was then closed and with buried 4-0 Monocryl sutures and a running 5-0 plain gut.  The dermal fat graft donor site was closed with interrupted buried 3-0 Monocryl sutures and a running 5-0 plain gut.  I then turned my attention to the right side.  Tumescent was infiltrated circumferentially and along the inframammary incision that was planned to match the other side.  I made the incision with a 15 blade and dissected down to the pectoralis major with cautery.  The breast was then lifted off the pectoralis major for a few centimeters superiorly.  The inferior attachments of the pectoralis major were then released and a subpectoral pocket was  developed.  A sterile sizer was then placed after obtaining hemostasis.  Patient was then set up and checked for size and symmetry.  The implant chosen for the left side was a Mentor memory gel 160 cc smooth moderate plus profile implant.  Serial number is [id number].  Implant  chosen for the left side is a Product manager memory gel 130 cc smooth moderate plus profile implant.  Serial number is [id number].  Both pockets were then irrigated with triple antibiotic solution.  Meticulous hemostasis was obtained.  I changed my gloves and inserted both implants with minimal touch technique.  Wounds were closed with erupted buried 3-0 PDS, interrupted buried 3-0 Monocryl, and running V-Loc.  She had a great on table result.  She was then dressed with 4 x 4's ABDs and Ace wrap.  The patient tolerated the procedure well.  There were no complications. The patient was allowed to wake from anesthesia, extubated and taken to the recovery room in satisfactory condition.

## 2020-01-17 NOTE — Anesthesia Postprocedure Evaluation (Signed)
Anesthesia Post Note  Patient: Susan [name]  Procedure(s) Performed: MAMMOPLASTY AUGMENTATION WITH PROSTHESIS (BREAST) (Bilateral Breast) POSSIBLE SCAR REVISION TO LEFT BREAST (Left Breast) SKIN GRAFT FULL THICKNESS (Breast)     Patient location during evaluation: PACU Anesthesia Type: General Level of consciousness: awake and alert Pain management: pain level controlled Vital Signs Assessment: post-procedure vital signs reviewed and stable Respiratory status: spontaneous breathing, nonlabored ventilation and respiratory function stable Cardiovascular status: blood pressure returned to baseline and stable Postop Assessment: no apparent nausea or vomiting Anesthetic complications: no    Last Vitals:  Vitals:   01/17/20 1015 01/17/20 1125  BP: 128/65   Pulse: (!) 54   Resp: 10   Temp:  36.9 C  SpO2: 100%     Last Pain:  Vitals:   01/17/20 1125  TempSrc: Oral  PainSc:                  Audry Pili

## 2020-01-20 ENCOUNTER — Encounter: Payer: Self-pay | Admitting: *Deleted

## 2020-01-22 ENCOUNTER — Encounter: Payer: PPO | Admitting: Surgical

## 2020-01-22 ENCOUNTER — Telehealth: Payer: Self-pay

## 2020-01-22 NOTE — Telephone Encounter (Signed)

## 2020-01-23 ENCOUNTER — Ambulatory Visit (INDEPENDENT_AMBULATORY_CARE_PROVIDER_SITE_OTHER): Payer: PPO | Admitting: Plastic Surgery

## 2020-01-23 ENCOUNTER — Encounter: Payer: Self-pay | Admitting: Plastic Surgery

## 2020-01-23 ENCOUNTER — Other Ambulatory Visit: Payer: Self-pay

## 2020-01-23 VITALS — BP 172/91 | HR 70 | Temp 97.1°F | Ht 68.0 in | Wt 154.2 lb

## 2020-01-23 DIAGNOSIS — C50919 Malignant neoplasm of unspecified site of unspecified female breast: Secondary | ICD-10-CM

## 2020-01-23 NOTE — Progress Notes (Signed)
Patient is postop from bilateral breast augmentation with scar revision and dermal fat graft to the left nipple areolar complex.  She is overall happy with the size and shape and her pain is quite a bit better than it was over the weekend.  She really does not have any complaints at this point.  On exam everything looks great.  Her incisions are healing nicely including the donor site on her abdomen.  There is some bruising on the right side that is going away but no rales significant swelling in the pocket on either side.  I removed to the Steri-Strip over the nipple areolar complex incision on the left and I think this will help it popped out a bit more as it was being tethered by the tape.  Overall she looks great  I have asked her to continue to avoid strenuous activities particular with her upper extremities.  I will plan to see her again in 3 weeks and she knows to call if she has any problems in the meantime.

## 2020-01-25 ENCOUNTER — Other Ambulatory Visit (HOSPITAL_COMMUNITY): Payer: PPO

## 2020-02-02 ENCOUNTER — Ambulatory Visit: Payer: PPO | Attending: Internal Medicine

## 2020-02-02 DIAGNOSIS — Z23 Encounter for immunization: Secondary | ICD-10-CM

## 2020-02-02 NOTE — Progress Notes (Signed)
   Covid-19 Vaccination Clinic  Name:  Susan [name]    MRN: [id number] DOB: 24-Nov-1951  02/02/2020  [name] was observed post Covid-19 immunization for 15 minutes without incidence. She was provided with Vaccine Information Sheet and instruction to access the V-Safe system.   [name] was instructed to call 911 with any severe reactions post vaccine: Marland Kitchen Difficulty breathing  . Swelling of your face and throat  . A fast heartbeat  . A bad rash all over your body  . Dizziness and weakness    Immunizations Administered    Name Date Dose VIS Date Route   Pfizer COVID-19 Vaccine 02/02/2020  2:10 AM 0.3 mL 12/06/2019 Intramuscular   Manufacturer: [place]   Lot: [id number]   [place]: [id number]

## 2020-02-04 ENCOUNTER — Encounter: Payer: Self-pay | Admitting: Family Medicine

## 2020-02-05 ENCOUNTER — Other Ambulatory Visit: Payer: Self-pay | Admitting: Family Medicine

## 2020-02-05 ENCOUNTER — Encounter: Payer: PPO | Admitting: Plastic Surgery

## 2020-02-05 MED ORDER — ALPRAZOLAM 0.5 MG PO TABS: 0.5000 mg | ORAL_TABLET | Freq: Every evening | ORAL | 1 refills | Status: DC | PRN

## 2020-02-13 ENCOUNTER — Ambulatory Visit: Payer: PPO | Admitting: Surgical

## 2020-02-20 ENCOUNTER — Ambulatory Visit (INDEPENDENT_AMBULATORY_CARE_PROVIDER_SITE_OTHER): Payer: PPO | Admitting: Surgical

## 2020-02-20 ENCOUNTER — Other Ambulatory Visit: Payer: Self-pay

## 2020-02-20 ENCOUNTER — Encounter: Payer: Self-pay | Admitting: Surgical

## 2020-02-20 VITALS — BP 147/85 | HR 65 | Temp 96.9°F | Ht 68.0 in | Wt 152.0 lb

## 2020-02-20 DIAGNOSIS — C50919 Malignant neoplasm of unspecified site of unspecified female breast: Secondary | ICD-10-CM

## 2020-02-20 NOTE — Progress Notes (Signed)
The patient is a 69 year old female here for follow-up after placement of bilateral breast augmentation, excision of left nipple areolar complex scar and dermal fat grafting to left inferior nipple areola.     She is doing really well.  Her incisions are well-healed.  She is overall happy with her outcome, considering the significant scar contracture she had on the left breast.  The left NAC scar contracture is significantly improved, dermal fat grafting seems to have taken nicely and is assisting with protruding left nipple to prevent folding of the skin edges.   Chaperone present on exam There is no sign of any infection, seroma, hematoma.   She has been wearing a normal bra starting a few days ago.  This did not cause her any discomfort.   All of her questions were answered.  Recommend following up as needed.  Call with any questions or concerns.

## 2020-02-23 ENCOUNTER — Ambulatory Visit: Payer: PPO

## 2020-02-27 ENCOUNTER — Ambulatory Visit: Payer: PPO | Attending: Internal Medicine

## 2020-02-27 DIAGNOSIS — Z23 Encounter for immunization: Secondary | ICD-10-CM | POA: Insufficient documentation

## 2020-02-27 NOTE — Progress Notes (Signed)
   Covid-19 Vaccination Clinic  Name:  Susan [name]    MRN: [id number] DOB: 05/30/1951  02/27/2020  [name] was observed post Covid-19 immunization for 15 minutes without incident. She was provided with Vaccine Information Sheet and instruction to access the V-Safe system.   [name] was instructed to call 911 with any severe reactions post vaccine: Marland Kitchen Difficulty breathing  . Swelling of face and throat  . A fast heartbeat  . A bad rash all over body  . Dizziness and weakness   Immunizations Administered    Name Date Dose VIS Date Route   Pfizer COVID-19 Vaccine 02/27/2020  8:51 AM 0.3 mL 12/06/2019 Intramuscular   Manufacturer: [place]   Lot: [id number]   [place]: [id number]

## 2020-03-05 ENCOUNTER — Ambulatory Visit (INDEPENDENT_AMBULATORY_CARE_PROVIDER_SITE_OTHER): Payer: PPO | Admitting: Surgical

## 2020-03-05 ENCOUNTER — Other Ambulatory Visit: Payer: Self-pay

## 2020-03-05 ENCOUNTER — Encounter: Payer: Self-pay | Admitting: Surgical

## 2020-03-05 ENCOUNTER — Telehealth: Payer: Self-pay | Admitting: Plastic Surgery

## 2020-03-05 VITALS — BP 157/88 | HR 65 | Temp 98.6°F | Ht 68.0 in | Wt 152.0 lb

## 2020-03-05 DIAGNOSIS — C50919 Malignant neoplasm of unspecified site of unspecified female breast: Secondary | ICD-10-CM

## 2020-03-05 NOTE — Telephone Encounter (Signed)
Patient lvm to say that there is still a suture that hasn't dissolved yet and it's red and feels like a thorn. Please call to advise if she needs to come back in.

## 2020-03-05 NOTE — Progress Notes (Signed)
The patient is a 69 year old female here for follow-up after placement of bilateral breast implants, excision of left nipple areolar complex scar and dermal fat grafting to left anterior nipple areola on 01/17/2020 with Dr. Mingo Amber.  She is doing really well, she is here today because she noticed a suture poking out of her skin along the right inframammary fold incision.  It appears to be a PDS or V lock suture.  I was able to cut the exposed suture using suture scissors and a pickup.  She instantly felt better.  Her bilateral incisions are healing really nicely.  She is pleased with her outcome.  Call with any questions or concerns follow-up as needed.

## 2020-03-05 NOTE — Telephone Encounter (Signed)
Spoke with patient, she is coming to see me today.

## 2020-03-30 ENCOUNTER — Encounter: Payer: Self-pay | Admitting: Family Medicine

## 2020-03-30 ENCOUNTER — Other Ambulatory Visit: Payer: Self-pay

## 2020-03-30 ENCOUNTER — Ambulatory Visit (INDEPENDENT_AMBULATORY_CARE_PROVIDER_SITE_OTHER): Payer: PPO | Admitting: Family Medicine

## 2020-03-30 VITALS — BP 126/74 | HR 76 | Temp 97.8°F | Ht 67.7 in | Wt 152.0 lb

## 2020-03-30 DIAGNOSIS — M25562 Pain in left knee: Secondary | ICD-10-CM

## 2020-03-30 DIAGNOSIS — I10 Essential (primary) hypertension: Secondary | ICD-10-CM | POA: Diagnosis not present

## 2020-03-30 DIAGNOSIS — M25561 Pain in right knee: Secondary | ICD-10-CM

## 2020-03-30 DIAGNOSIS — G4700 Insomnia, unspecified: Secondary | ICD-10-CM | POA: Diagnosis not present

## 2020-03-30 DIAGNOSIS — G8929 Other chronic pain: Secondary | ICD-10-CM | POA: Diagnosis not present

## 2020-03-30 NOTE — Patient Instructions (Signed)
Go to the pharmacy to get your Shingles vaccine  Exercise hand out

## 2020-03-30 NOTE — Assessment & Plan Note (Signed)
Rare use of xanax. Tried OTC w/o improvement. Very cautious about avoiding regular use. Ok to continue - will need contract for next refill.

## 2020-03-30 NOTE — Assessment & Plan Note (Signed)
Suspect arthritis with mild symptoms. Home strengthening program provided. Advise f/u if worsening and would recommend starting with PT. Currently walking w/o difficulty.

## 2020-03-30 NOTE — Progress Notes (Signed)
Subjective:     Susan [name] is a 69 y.o. female presenting for Transfer of Care (Asking if Dr Einar Pheasant recommends Shingrix.) and Knee Pain (Intermittent bilateral pain with "catching". More in the left. Did fall back in January.)     HPI  #Vaccine question - had the zoster vaccine wondering if she should get shingrix   #knee pain - in consistent - worse when she stands up after sitting - not always the same the knee, not same - both knees can be bothersome - does regular walking - took a fall recently - tripped over the side walk - does a lot of gardening - and got more irritated with up and down movement - will get sudden pinching pain - no buckling or locking   #insomnia - using xanax  - last dose was early February - will occasionally have a few nights where she needs medication - very mindful of medication dependency  - tried over the counter medications w/ significant groggy symptoms the next    Review of Systems   Social History   Tobacco Use  Smoking Status Never Smoker  Smokeless Tobacco Never Used        Objective:    BP Readings from Last 3 Encounters:  03/30/20 126/74  03/05/20 (!) 157/88  02/20/20 (!) 147/85   Wt Readings from Last 3 Encounters:  03/30/20 152 lb (68.9 kg)  03/05/20 152 lb (68.9 kg)  02/20/20 152 lb (68.9 kg)    BP 126/74 (BP Location: Left Arm, Patient Position: Sitting, Cuff Size: Normal)   Pulse 76   Temp 97.8 F (36.6 C)   Ht 5' 7.7" (1.72 m)   Wt 152 lb (68.9 kg)   SpO2 98%   BMI 23.32 kg/m    Physical Exam Constitutional:      General: She is not in acute distress.    Appearance: She is well-developed. She is not diaphoretic.  HENT:     Right Ear: External ear normal.     Left Ear: External ear normal.     Nose: Nose normal.  Eyes:     Conjunctiva/sclera: Conjunctivae normal.  Cardiovascular:     Rate and Rhythm: Normal rate and regular rhythm.     Heart sounds: No murmur.  Pulmonary:     Effort:  Pulmonary effort is normal.     Breath sounds: Normal breath sounds.  Musculoskeletal:     Cervical back: Neck supple.     Comments: Bilateral knee:  Inspection: normal appearing, no swelling Palpation: no joint line TTP, no other TTP ROM: normal, with occasional crepitus Ligaments: normal Strength: normal  Skin:    General: Skin is warm and dry.     Capillary Refill: Capillary refill takes less than 2 seconds.  Neurological:     Mental Status: She is alert. Mental status is at baseline.  Psychiatric:        Mood and Affect: Mood normal.        Behavior: Behavior normal.           Assessment & Plan:   Problem List Items Addressed This Visit      Cardiovascular and Mediastinum   Essential hypertension    BP well controled. Continue metoprolol        Other   Insomnia - Primary    Rare use of xanax. Tried OTC w/o improvement. Very cautious about avoiding regular use. Ok to continue - will need contract for next refill.  Chronic pain of both knees    Suspect arthritis with mild symptoms. Home strengthening program provided. Advise f/u if worsening and would recommend starting with PT. Currently walking w/o difficulty.       Relevant Medications   ibuprofen (ADVIL) 200 MG tablet       Return in about 1 year (around 03/30/2021).  Lesleigh Noe, MD

## 2020-03-30 NOTE — Assessment & Plan Note (Signed)
BP well controled. Continue metoprolol

## 2020-05-28 ENCOUNTER — Other Ambulatory Visit: Payer: Self-pay

## 2020-05-28 ENCOUNTER — Ambulatory Visit (INDEPENDENT_AMBULATORY_CARE_PROVIDER_SITE_OTHER): Payer: PPO | Admitting: Family Medicine

## 2020-05-28 ENCOUNTER — Encounter: Payer: Self-pay | Admitting: Family Medicine

## 2020-05-28 VITALS — BP 138/88 | HR 72 | Temp 98.4°F | Ht 67.0 in | Wt 152.0 lb

## 2020-05-28 DIAGNOSIS — N898 Other specified noninflammatory disorders of vagina: Secondary | ICD-10-CM

## 2020-05-28 NOTE — Patient Instructions (Signed)
I will send a mychart message with the results tomorrow

## 2020-05-28 NOTE — Progress Notes (Signed)
   Subjective:     Susan [name] is a 69 y.o. female presenting for Vaginitis     HPI   #Vaginal discharge - 2 rounds of OTC monostat w/o improve - yellowish discharge - some itching - will feel crusty from discharge - no odor - no recent antibiotics - on and off symptoms for a few months - did get antibiotics the day of surgery in January - no rash   Review of Systems  Genitourinary: Positive for vaginal discharge. Negative for dyspareunia, dysuria, hematuria, pelvic pain and urgency.     Social History   Tobacco Use  Smoking Status Never Smoker  Smokeless Tobacco Never Used        Objective:    BP Readings from Last 3 Encounters:  05/28/20 138/88  03/30/20 126/74  03/05/20 (!) 157/88   Wt Readings from Last 3 Encounters:  05/28/20 152 lb (68.9 kg)  03/30/20 152 lb (68.9 kg)  03/05/20 152 lb (68.9 kg)    BP 138/88   Pulse 72   Temp 98.4 F (36.9 C) (Temporal)   Ht 5\' 7"  (1.702 m)   Wt 152 lb (68.9 kg)   SpO2 97%   BMI 23.81 kg/m    Physical Exam Exam conducted with a chaperone present.  Constitutional:      General: She is not in acute distress.    Appearance: She is well-developed. She is not diaphoretic.  HENT:     Right Ear: External ear normal.     Left Ear: External ear normal.  Eyes:     Conjunctiva/sclera: Conjunctivae normal.  Cardiovascular:     Rate and Rhythm: Normal rate.  Pulmonary:     Effort: Pulmonary effort is normal.  Genitourinary:    Pubic Area: Rash (mild erythema) present.  Musculoskeletal:     Cervical back: Neck supple.  Skin:    General: Skin is warm and dry.     Capillary Refill: Capillary refill takes less than 2 seconds.  Neurological:     Mental Status: She is alert. Mental status is at baseline.  Psychiatric:        Mood and Affect: Mood normal.        Behavior: Behavior normal.           Assessment & Plan:   Problem List Items Addressed This Visit    None    Visit Diagnoses    Vaginal  discharge    -  Primary   Relevant Orders   WET PREP BY MOLECULAR PROBE     Failed OTC treatment. Will test to r/o BV. Treat based on result  Return if symptoms worsen or fail to improve.  Lesleigh Noe, MD

## 2020-05-29 ENCOUNTER — Encounter: Payer: Self-pay | Admitting: Family Medicine

## 2020-05-29 ENCOUNTER — Other Ambulatory Visit: Payer: Self-pay | Admitting: Family Medicine

## 2020-05-29 LAB — WET PREP BY MOLECULAR PROBE
Candida species: NOT DETECTED
Gardnerella vaginalis: NOT DETECTED
MICRO NUMBER:: 10549302
SPECIMEN QUALITY:: ADEQUATE
Trichomonas vaginosis: NOT DETECTED

## 2020-05-29 MED ORDER — FLUCONAZOLE 150 MG PO TABS: 150.0000 mg | ORAL_TABLET | Freq: Once | ORAL | 0 refills | Status: AC

## 2020-06-02 DIAGNOSIS — H2513 Age-related nuclear cataract, bilateral: Secondary | ICD-10-CM | POA: Diagnosis not present

## 2020-06-05 ENCOUNTER — Encounter: Payer: Self-pay | Admitting: Family Medicine

## 2020-06-05 DIAGNOSIS — N898 Other specified noninflammatory disorders of vagina: Secondary | ICD-10-CM

## 2020-06-16 ENCOUNTER — Other Ambulatory Visit: Payer: Self-pay | Admitting: Hematology and Oncology

## 2020-06-16 ENCOUNTER — Ambulatory Visit
Admission: RE | Admit: 2020-06-16 | Discharge: 2020-06-16 | Disposition: A | Discharge status: Home / Self Care | Payer: PPO | Source: Ambulatory Visit | Attending: Hematology and Oncology | Admitting: Hematology and Oncology

## 2020-06-16 DIAGNOSIS — Z853 Personal history of malignant neoplasm of breast: Secondary | ICD-10-CM | POA: Diagnosis not present

## 2020-06-16 DIAGNOSIS — R922 Inconclusive mammogram: Secondary | ICD-10-CM | POA: Diagnosis not present

## 2020-06-16 DIAGNOSIS — D0512 Intraductal carcinoma in situ of left breast: Secondary | ICD-10-CM

## 2020-06-16 NOTE — Progress Notes (Signed)
[facility]  [address], [address] [place], [place] [address] Phone: [phone]  Fax: [phone]   Clinic Day:  06/18/2020  Referring physician: Lucille Passy, MD  Chief Complaint: Susan [name] is a 69 y.o. 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 At that time, she was doing well and denied any breast concerns.  CBC and CMP were normal.  She continued tamoxifen. Plastic surgery consult was set up for reconstruction.   The patient saw Dr. Claudia Desanctis on 12/23/2019 to discuss breast reconstruction. They decided on bilateral silicone implants, possible mastopexy to right breast, liposuction with lipofilling bilaterally, and possible scar revision to left breast.  She saw Dr. Eulas Post on 01/09/2020 for a 1 year follow up after completing whole breast radiation. She was doing well.  Will continue on tamoxifen and follow up in 1 year.  The patient underwent mammoplastly augmentation with prosthesis by Dr. Claudia Desanctis on 01/17/2020.  The patient saw Dr. Waunita Schooner on 03/30/2020.  She noted insomnia and chronic bilateral knee pain. She uses Xanax rarely to help her sleep. Physical therapy was recommended.  Bilateral diagnostic mammogram on 06/16/2020 revealed no evidence of breast malignancy.  During the interim, she has been doing well. She reports that her reconstructive surgery did not completely fix the problem, but she is reluctant to pursue a second surgery. She reports leg cramps some nights and occasional hip pain. The patient performs monthly breast exams and has no concerns. Denies vaginal bleeding. She is doing well on tamoxifen.  She received the [place] COVID-19 vaccines on 02/02/2020 and 99/37/1696 and had no complications.   Past Medical History:  Diagnosis Date  . Arthritis   . Breast cancer ([place])    Left DCIS  . Cancer ([place])   .  Complication of anesthesia   . Dysrhythmia    PSVT on metoprolol  . Family history of breast cancer   . Gastric reflux 90's  . GERD (gastroesophageal reflux disease)   . Headache    H/O MIGRAINES  . Hiatal hernia 2003  . History of Coronary CT for Calcium Scoring    a. 11/2018 CT Cor Ca2+ = Zero.  Marland Kitchen Hypertension   . Mitral regurgitation    a. 03/2017 Echo: EF 55-60%, no nrwma, Mod MR; b. 03/2018 Echo: EF 60-65%, no rwma. Gr2 DD. Mild MV prolapse of ant leaflet w/ mild to mod mR. Nl RV fxn. Mild to mod TR.  Marland Kitchen Personal history of radiation therapy 2019   LEFT lumpectomy  . PONV (postoperative nausea and vomiting)    N/V  . PSVT (paroxysmal supraventricular tachycardia) ([place])    a. 03/2017 Event Monitor: Avg HR 66, 10 episodes of SVT up to 42.8 secs, fastest 148 bpm. 8 beats WCT (179 bpm) - VT vs SVT w/ aberrancy. Rare PACs (2.6% total beats).    Past Surgical History:  Procedure Laterality Date  . AUGMENTATION MAMMAPLASTY Bilateral 01/17/2020  . BREAST BIOPSY Left 06/13/2018   Affirm Bx- coil clip   atypical sclerosing and DCIS  . BREAST ENHANCEMENT SURGERY Bilateral 01/17/2020   Procedure: MAMMOPLASTY AUGMENTATION WITH PROSTHESIS (BREAST);  Surgeon: Cindra Presume, MD;  Location: [facility];  Service: Plastics;  Laterality: Bilateral;  2.5 hours for total case  . BREAST EXCISIONAL BIOPSY Right 2000   benign  . BREAST LUMPECTOMY Left 06/26/2018   atypical sclerosing lesion  and DCIS bracketing  . BREAST LUMPECTOMY WITH NEEDLE LOCALIZATION Left 06/26/2018   Procedure: BREAST LUMPECTOMY WITH NEEDLE LOCALIZATION;  Surgeon: Jules Husbands, MD;  Location: [facility];  Service: General;  Laterality: Left;  . CHOLECYSTECTOMY    . OVARIAN CYST SURGERY    . SCAR REVISION Left 01/17/2020   Procedure: POSSIBLE SCAR REVISION TO LEFT BREAST;  Surgeon: Cindra Presume, MD;  Location: [facility];  Service: Plastics;  Laterality: Left;  . SKIN FULL THICKNESS GRAFT  01/17/2020    Procedure: SKIN GRAFT FULL THICKNESS;  Surgeon: Cindra Presume, MD;  Location: [facility];  Service: Plastics;;  . TONSILLECTOMY     age 64  . UPPER GI ENDOSCOPY  2003    Family History  Problem Relation Age of Onset  . Hyperlipidemia Mother   . Hypertension Mother   . COPD Mother   . Heart failure Mother        deceased 76  . Stroke Father 69  . Hypertension Father   . Hypertension Sister   . Hypertension Brother   . Healthy Brother   . Breast cancer Cousin 28       daughter of paternal aunt; currently 72  . Breast cancer Cousin 67       daughter of paternal uncle; currently 64  . Breast cancer Cousin 43       daughter of maternal uncle; deceased 11  . Testicular cancer Other 64       sister's son; currently 79  . Melanoma Paternal Uncle   . Breast cancer Cousin 9       daughter of same maternal uncle; currently 31    Social History:  reports that she has never smoked. She has never used smokeless tobacco. She reports current alcohol use of about 2.0 standard drinks of alcohol per week. She reports that she does not use drugs. She denies tobacco use. She drinks wine with dinner. Patient is a [place] native, however has spent the last several years in [place]. Patient is a retired Administrator, arts. Patient denies known exposures to radiation on toxins. She has a golden doodle name Tharon Aquas. The patient is alone today.  Allergies: No Known Allergies  Current Medications: Current Outpatient Medications  Medication Sig Dispense Refill  . acetaminophen (TYLENOL) 500 MG tablet Take 1,000 mg by mouth every 6 (six) hours as needed.    . ALPRAZolam (XANAX) 0.5 MG tablet Take 1 tablet (0.5 mg total) by mouth at bedtime as needed. for sleep 30 tablet 1  . calcium citrate-vitamin D 500-500 MG-UNIT chewable tablet Chew 1 tablet by mouth 2 (two) times daily.    . cholecalciferol (VITAMIN D) 1000 units tablet Take 1,000 Units by mouth daily.    . famotidine (PEPCID) 20 MG  tablet Take 20 mg by mouth as needed for heartburn or indigestion.    . fluticasone (FLONASE) 50 MCG/ACT nasal spray Place 2 sprays into both nostrils as needed.     Marland Kitchen ibuprofen (ADVIL) 200 MG tablet Take 200 mg by mouth every 6 (six) hours as needed.    . metoprolol succinate (TOPROL-XL) 25 MG 24 hr tablet Take 0.5 tablets (12.5 mg total) by mouth daily. 45 tablet 3  . metroNIDAZOLE (FLAGYL) 500 MG tablet Take 1 tablet (500 mg total) by mouth 2 (two) times daily. 14 tablet 0  . metroNIDAZOLE (METROGEL) 1 % gel Apply 1 application topically as needed.    . psyllium (TGT PSYLLIUM FIBER) 0.52  g capsule Take 0.52 g by mouth 3 (three) times daily.     Marland Kitchen SIMETHICONE-80 PO Take 80 mg by mouth as needed (gas).     . tamoxifen (NOLVADEX) 20 MG tablet TAKE 1 TABLET BY MOUTH EVERY DAY 90 tablet 3   No current facility-administered medications for this visit.    Review of Systems  Constitutional: Negative.  Negative for chills, diaphoresis, fever, malaise/fatigue and weight loss (up).       Doing well.  HENT: Negative.  Negative for congestion, ear pain, hearing loss, nosebleeds, sinus pain and sore throat.   Eyes: Negative for blurred vision and double vision.  Respiratory: Negative for cough, sputum production, shortness of breath and wheezing.   Cardiovascular: Negative.  Negative for chest pain, palpitations, claudication, leg swelling and PND.  Gastrointestinal: Negative.  Negative for abdominal pain, blood in stool, constipation, diarrhea, heartburn, melena, nausea and vomiting.  Genitourinary: Negative.  Negative for dysuria, frequency, hematuria and urgency.  Musculoskeletal: Positive for myalgias (nocturnal leg cramps- chronic;  right hip pain- occasional). Negative for back pain and joint pain.  Skin: Negative.  Negative for rash.  Neurological: Negative for dizziness, tingling, sensory change, weakness and headaches.  Endo/Heme/Allergies: Does not bruise/bleed easily.   Psychiatric/Behavioral: Negative for depression and memory loss. The patient is not nervous/anxious and does not have insomnia.   All other systems reviewed and are negative.  Performance status (ECOG): 0 - Asymptomatic  Vitals Blood pressure (!) 142/67, pulse 60, temperature (!) 97.4 F (36.3 C), temperature source Tympanic, weight 152 lb 0.1 oz (68.9 kg), SpO2 99 %.   Physical Exam Vitals and nursing note reviewed.  Constitutional:      General: She is not in acute distress.    Appearance: She is well-developed. She is not diaphoretic.  HENT:     Head: Normocephalic and atraumatic.     Mouth/Throat:     Mouth: Mucous membranes are moist.     Pharynx: Oropharynx is clear.  Eyes:     General: No scleral icterus.    Conjunctiva/sclera: Conjunctivae normal.     Comments: Glasses.  Brown eyes.  Cardiovascular:     Rate and Rhythm: Normal rate and regular rhythm.     Pulses: Normal pulses.     Heart sounds: Normal heart sounds. No murmur heard.   Pulmonary:     Effort: Pulmonary effort is normal. No respiratory distress.     Breath sounds: Normal breath sounds. No wheezing or rales.  Chest:     Chest wall: No tenderness.     Breasts:        Left: Tenderness (inferior aspect, tight) present.     Comments: S/p bilateral mammoplasty augmentation Abdominal:     General: Bowel sounds are normal. There is no distension.     Palpations: Abdomen is soft. There is no mass.     Tenderness: There is no abdominal tenderness. There is no guarding or rebound.  Musculoskeletal:        General: No swelling or tenderness. Normal range of motion.     Cervical back: Normal range of motion and neck supple.  Skin:    General: Skin is warm and dry.  Neurological:     Mental Status: She is alert and oriented to person, place, and time. Mental status is at baseline.  Psychiatric:        Behavior: Behavior normal.        Thought Content: Thought content normal.  Judgment: Judgment  normal.    Appointment on 06/18/2020  Component Date Value Ref Range Status  . Sodium 06/18/2020 138  135 - 145 mmol/L Final  . Potassium 06/18/2020 4.8  3.5 - 5.1 mmol/L Final  . Chloride 06/18/2020 104  98 - 111 mmol/L Final  . CO2 06/18/2020 27  22 - 32 mmol/L Final  . Glucose, Bld 06/18/2020 79  70 - 99 mg/dL Final   Glucose reference range applies only to samples taken after fasting for at least 8 hours.  . BUN 06/18/2020 18  8 - 23 mg/dL Final  . Creatinine, Ser 06/18/2020 0.83  0.44 - 1.00 mg/dL Final  . Calcium 06/18/2020 8.9  8.9 - 10.3 mg/dL Final  . Total Protein 06/18/2020 6.9  6.5 - 8.1 g/dL Final  . Albumin 06/18/2020 4.1  3.5 - 5.0 g/dL Final  . AST 06/18/2020 23  15 - 41 U/L Final  . ALT 06/18/2020 21  0 - 44 U/L Final  . Alkaline Phosphatase 06/18/2020 41  38 - 126 U/L Final  . Total Bilirubin 06/18/2020 1.1  0.3 - 1.2 mg/dL Final  . GFR calc non Af Amer 06/18/2020 >60  >60 mL/min Final  . GFR calc Af Amer 06/18/2020 >60  >60 mL/min Final  . Anion gap 06/18/2020 7  5 - 15 Final   Performed at [facility], [address], [place], [place] [address]  . WBC 06/18/2020 5.6  4.0 - 10.5 K/uL Final  . RBC 06/18/2020 4.97  3.87 - 5.11 MIL/uL Final  . Hemoglobin 06/18/2020 14.8  12.0 - 15.0 g/dL Final  . HCT 06/18/2020 44.8  36 - 46 % Final  . MCV 06/18/2020 90.1  80.0 - 100.0 fL Final  . MCH 06/18/2020 29.8  26.0 - 34.0 pg Final  . MCHC 06/18/2020 33.0  30.0 - 36.0 g/dL Final  . RDW 06/18/2020 13.2  11.5 - 15.5 % Final  . Platelets 06/18/2020 261  150 - 400 K/uL Final  . nRBC 06/18/2020 0.0  0.0 - 0.2 % Final  . Neutrophils Relative % 06/18/2020 64  % Final  . Neutro Abs 06/18/2020 3.6  1.7 - 7.7 K/uL Final  . Lymphocytes Relative 06/18/2020 21  % Final  . Lymphs Abs 06/18/2020 1.2  0.7 - 4.0 K/uL Final  . Monocytes Relative 06/18/2020 11  % Final  . Monocytes Absolute 06/18/2020 0.6  0 - 1 K/uL Final  . Eosinophils Relative 06/18/2020 3  % Final  .  Eosinophils Absolute 06/18/2020 0.1  0 - 0 K/uL Final  . Basophils Relative 06/18/2020 1  % Final  . Basophils Absolute 06/18/2020 0.1  0 - 0 K/uL Final  . Immature Granulocytes 06/18/2020 0  % Final  . Abs Immature Granulocytes 06/18/2020 0.02  0.00 - 0.07 K/uL Final   Performed at [facility], [address], [place], [place] [address]    Assessment:  Susan [name] is a 69 y.o. female with left breast DCISs/p left partial mastectomy with needle localization on 06/26/2018. Pathologyrevealed at least 28 mm grade 2-3 DCIS with sclerosing adenosis, cystic papillary apocrine metaplasia. There was central expansive comedonecrosis. Margins were negative (closest 1.7 mm). DCIS was ER positive (>90%) and PR negative (<1%). Pathologic stagewas pTis pNx.  Diagnostic left mammogramon 06/06/2018 revealed indeterminate microcalcifications over the inner midportion of the left breast. There were 2 adjacent groups of heterogeneous microcalcifications with some linear forms spanning 1.1 x 2.0 x 2.4 cm. Bilateral diagnostic mammogram on  06/16/2020 revealed no evidence of breast malignancy.  She receivedradiation(07/30/2018 - 08/20/2018) followed by electron beam treatment (08/21/2018 - 08/31/2018). She began tamoxifenon 09/05/2018.  She underwent mammoplastly augmentation with prosthesis on 01/17/2020.  Bone densityon 07/26/2018 revealed osteopenia with a T-score of -1.8 in the left femoral neck.  Bone density on 08/27/2019 revealed osteopenia with a T-score of -1.7 in the left femur neck.  Symptomatically, she is doing well.  She denies any breast concerns.  Exam is unremarkable.  Plan: 1.   Labs today: CBC with diff, CMP. 2.   Left breast DCIS Clinically, she is doing well.             Bilateral diagnostic mammogram on 06/16/2020 revealed no evidence of malignancy. She is s/p mammoplasty augmentation with prosthesis on  01/17/2020.  Continue tamoxifen for 5 years (began  09/05/2018) Continue surveillance. 3.   Osteopenia Bone density on 08/27/2019 revealed stable osteopenia.  Continue calcium and vitamin D. 4.   RTC in 6 months for MD assessment and labs (CBC with diff, CMP).  I discussed the assessment and treatment plan with the patient.  The patient was provided an opportunity to ask questions and all were answered.  The patient agreed with the plan and demonstrated an understanding of the instructions.  The patient was advised to call back if the symptoms worsen or if the condition fails to improve as anticipated.   Lequita Asal, MD, PhD    06/18/2020, 9:40 AM  I, De Burrs, am acting as a Education administrator for Lequita Asal, MD.  I, [place] Mike Gip, MD, have reviewed the above documentation for accuracy and completeness, and I agree with the above.

## 2020-06-17 ENCOUNTER — Other Ambulatory Visit: Payer: Self-pay

## 2020-06-17 ENCOUNTER — Ambulatory Visit: Payer: PPO | Admitting: Advanced Practice Midwife

## 2020-06-17 ENCOUNTER — Encounter: Payer: Self-pay | Admitting: Advanced Practice Midwife

## 2020-06-17 ENCOUNTER — Encounter: Payer: PPO | Admitting: Advanced Practice Midwife

## 2020-06-17 ENCOUNTER — Other Ambulatory Visit (HOSPITAL_COMMUNITY)
Admission: RE | Admit: 2020-06-17 | Discharge: 2020-06-17 | Disposition: A | Discharge status: Home / Self Care | Payer: PPO | Source: Ambulatory Visit | Attending: Advanced Practice Midwife | Admitting: Advanced Practice Midwife

## 2020-06-17 ENCOUNTER — Ambulatory Visit: Payer: PPO | Admitting: *Deleted

## 2020-06-17 VITALS — BP 137/80 | HR 63 | Wt 152.0 lb

## 2020-06-17 DIAGNOSIS — N952 Postmenopausal atrophic vaginitis: Secondary | ICD-10-CM

## 2020-06-17 DIAGNOSIS — N898 Other specified noninflammatory disorders of vagina: Secondary | ICD-10-CM

## 2020-06-17 MED ORDER — METRONIDAZOLE 500 MG PO TABS: 500.0000 mg | ORAL_TABLET | Freq: Two times a day (BID) | ORAL | 0 refills | Status: DC

## 2020-06-17 MED ORDER — CLOTRIMAZOLE 1 % VA CREA: 1.0000 | TOPICAL_CREAM | Freq: Every day | VAGINAL | 2 refills | Status: DC

## 2020-06-17 NOTE — Progress Notes (Signed)
GYNECOLOGY ANNUAL PREVENTATIVE CARE ENCOUNTER NOTE  History:     Susan [name] is a 69 y.o. G0P0000 female here for a routine annual gynecologic exam.  Current complaints: abnormal vaginal discharge. Patient describes recurrent thin filmy discharge which she first noticed last winter. She self-treated for yeast with Monistat then was prescribed Diflucan with only short-term relief of symptoms. Denies abnormal vaginal bleeding, discharge, pelvic pain, problems with intercourse or other gynecologic concerns.    Gynecologic History No LMP recorded. Patient is postmenopausal. Contraception: post menopausal status    Obstetric History OB History  Gravida Para Term Preterm AB Living  0 0 0 0 0 0  SAB TAB Ectopic Multiple Live Births  0 0 0 0 0    Past Medical History:  Diagnosis Date  . Arthritis   . Breast cancer ([place])    Left DCIS  . Cancer ([place])   . Complication of anesthesia   . Dysrhythmia    PSVT on metoprolol  . Family history of breast cancer   . Gastric reflux 90's  . GERD (gastroesophageal reflux disease)   . Headache    H/O MIGRAINES  . Hiatal hernia 2003  . History of Coronary CT for Calcium Scoring    a. 11/2018 CT Cor Ca2+ = Zero.  Marland Kitchen Hypertension   . Mitral regurgitation    a. 03/2017 Echo: EF 55-60%, no nrwma, Mod MR; b. 03/2018 Echo: EF 60-65%, no rwma. Gr2 DD. Mild MV prolapse of ant leaflet w/ mild to mod mR. Nl RV fxn. Mild to mod TR.  Marland Kitchen Personal history of radiation therapy 2019   LEFT lumpectomy  . PONV (postoperative nausea and vomiting)    N/V  . PSVT (paroxysmal supraventricular tachycardia) ([place])    a. 03/2017 Event Monitor: Avg HR 66, 10 episodes of SVT up to 42.8 secs, fastest 148 bpm. 8 beats WCT (179 bpm) - VT vs SVT w/ aberrancy. Rare PACs (2.6% total beats).    Past Surgical History:  Procedure Laterality Date  . AUGMENTATION MAMMAPLASTY Bilateral 01/17/2020  . BREAST BIOPSY Left 06/13/2018   Affirm Bx- coil clip   atypical sclerosing  and DCIS  . BREAST ENHANCEMENT SURGERY Bilateral 01/17/2020   Procedure: MAMMOPLASTY AUGMENTATION WITH PROSTHESIS (BREAST);  Surgeon: Cindra Presume, MD;  Location: [facility];  Service: Plastics;  Laterality: Bilateral;  2.5 hours for total case  . BREAST EXCISIONAL BIOPSY Right 2000   benign  . BREAST LUMPECTOMY Left 06/26/2018   atypical sclerosing lesion and DCIS bracketing  . BREAST LUMPECTOMY WITH NEEDLE LOCALIZATION Left 06/26/2018   Procedure: BREAST LUMPECTOMY WITH NEEDLE LOCALIZATION;  Surgeon: Jules Husbands, MD;  Location: [facility];  Service: General;  Laterality: Left;  . CHOLECYSTECTOMY    . OVARIAN CYST SURGERY    . SCAR REVISION Left 01/17/2020   Procedure: POSSIBLE SCAR REVISION TO LEFT BREAST;  Surgeon: Cindra Presume, MD;  Location: [facility];  Service: Plastics;  Laterality: Left;  . SKIN FULL THICKNESS GRAFT  01/17/2020   Procedure: SKIN GRAFT FULL THICKNESS;  Surgeon: Cindra Presume, MD;  Location: [facility];  Service: Plastics;;  . TONSILLECTOMY     age 40  . UPPER GI ENDOSCOPY  2003    Current Outpatient Medications on File Prior to Visit  Medication Sig Dispense Refill  . acetaminophen (TYLENOL) 500 MG tablet Take 1,000 mg by mouth every 6 (six) hours as needed.    . ALPRAZolam (XANAX) 0.5 MG tablet  Take 1 tablet (0.5 mg total) by mouth at bedtime as needed. for sleep 30 tablet 1  . calcium citrate-vitamin D 500-500 MG-UNIT chewable tablet Chew 1 tablet by mouth 2 (two) times daily.    . cholecalciferol (VITAMIN D) 1000 units tablet Take 1,000 Units by mouth daily.    . famotidine (PEPCID) 20 MG tablet Take 20 mg by mouth as needed for heartburn or indigestion.    . fluticasone (FLONASE) 50 MCG/ACT nasal spray Place 2 sprays into both nostrils as needed.     Marland Kitchen ibuprofen (ADVIL) 200 MG tablet Take 200 mg by mouth every 6 (six) hours as needed.    . metoprolol succinate (TOPROL-XL) 25 MG 24 hr tablet Take 0.5 tablets  (12.5 mg total) by mouth daily. 45 tablet 3  . metroNIDAZOLE (METROGEL) 1 % gel Apply 1 application topically as needed.    . psyllium (TGT PSYLLIUM FIBER) 0.52 g capsule Take 0.52 g by mouth 3 (three) times daily.     Marland Kitchen SIMETHICONE-80 PO Take 80 mg by mouth as needed (gas).     . tamoxifen (NOLVADEX) 20 MG tablet TAKE 1 TABLET BY MOUTH EVERY DAY 90 tablet 3   No current facility-administered medications on file prior to visit.    No Known Allergies  Social History:  reports that she has never smoked. She has never used smokeless tobacco. She reports current alcohol use of about 2.0 standard drinks of alcohol per week. She reports that she does not use drugs.  Family History  Problem Relation Age of Onset  . Hyperlipidemia Mother   . Hypertension Mother   . COPD Mother   . Heart failure Mother        deceased 63  . Stroke Father 12  . Hypertension Father   . Hypertension Sister   . Hypertension Brother   . Healthy Brother   . Breast cancer Cousin 33       daughter of paternal aunt; currently 93  . Breast cancer Cousin 15       daughter of paternal uncle; currently 47  . Breast cancer Cousin 22       daughter of maternal uncle; deceased 16  . Testicular cancer Other 26       sister's son; currently 73  . Melanoma Paternal Uncle   . Breast cancer Cousin 53       daughter of same maternal uncle; currently 53    The following portions of the patient's history were reviewed and updated as appropriate: allergies, current medications, past family history, past medical history, past social history, past surgical history and problem list.  Review of Systems Pertinent items noted in HPI and remainder of comprehensive ROS otherwise negative.  Physical Exam:  BP 137/80   Pulse 63   Wt 152 lb (68.9 kg)   BMI 23.81 kg/m  CONSTITUTIONAL: Well-developed, well-nourished female in no acute distress.  HENT:  Normocephalic, atraumatic, External right and left ear normal. Oropharynx is  clear and moist EYES: Conjunctivae and EOM are normal. Pupils are equal, round, and reactive to light. No scleral icterus.  NECK: Normal range of motion, supple, no masses.  Normal thyroid.  SKIN: Skin is warm and dry. No rash noted. Not diaphoretic. No erythema. No pallor. MUSCULOSKELETAL: Normal range of motion. No tenderness.  No cyanosis, clubbing, or edema.  2+ distal pulses. NEUROLOGIC: Alert and oriented to person, place, and time. Normal reflexes, muscle tone coordination.  PSYCHIATRIC: Normal mood and affect. Normal behavior. Normal judgment  and thought content. CARDIOVASCULAR: Normal heart rate noted, regular rhythm RESPIRATORY:  Effort normal, no problems with respiration noted. ABDOMEN: Soft, no distention noted.  No tenderness, rebound or guarding.  PELVIC: Normal appearing external genitalia and urethral meatus; vaginal mucosa slightly red and irritated. Patient wincing with gentle retraction of labia.   Thin white discharge visible at introitus. Performed in the presence of a chaperone.   Assessment and Plan:    1. Vaginal discharge - Consistent with BV. Rx to pharmacy - Cervicovaginal ancillary only  2. Vaginal atrophy - Discussed prolonged history of pain at introitus and vaginal pain with Dr. Rosana Hoes, who recommends Pelvic Floor PT - Ambulatory referral to Physical Therapy   Routine preventative health maintenance measures emphasized. Please refer to After Visit Summary for other counseling recommendations.      Mallie Snooks, MSN, CNM Certified Nurse Midwife, [facility] for [employer], [facility] 06/17/20 8:54 PM

## 2020-06-17 NOTE — Patient Instructions (Signed)
Atrophic Vaginitis Atrophic vaginitis is a condition in which the tissues that line the vagina become dry and thin. This condition occurs in women who have stopped having their period. It is caused by a drop in a female hormone (estrogen). This hormone helps:  To keep the vagina moist.  To make a clear fluid. This clear fluid helps: ? To make the vagina ready for sex. ? To protect the vagina from infection. If the lining of the vagina is dry and thin, it may cause irritation, burning, or itchiness. It may also:  Make sex painful.  Make an exam of your vagina painful.  Cause bleeding.  Make you lose interest in sex.  Cause a burning feeling when you pee (urinate).  Cause a brown or yellow fluid to come from your vagina. Some women do not have symptoms. Follow these instructions at home: Medicines  Take over-the-counter and prescription medicines only as told by your doctor.  Do not use herbs or other medicines unless your doctor says it is okay.  Use medicines for for dryness. These include: ? Oils to make the vagina soft. ? Creams. ? Moisturizers. General instructions  Do not douche.  Do not use products that can make your vagina dry. These include: ? Scented sprays. ? Scented tampons. ? Scented soaps.  Sex can help increase blood flow and soften the tissue in the vagina. If it hurts to have sex: ? Tell your partner. ? Use products to make sex more comfortable. Use these only as told by your doctor. Contact a doctor if you:  Have discharge from the vagina that is different than usual.  Have a bad smell coming from your vagina.  Have new symptoms.  Do not get better.  Get worse. Summary  Atrophic vaginitis is a condition in which the lining of the vagina becomes dry and thin.  This condition affects women who have stopped having their periods.  Treatment may include using products that help make the vagina soft.  Call a doctor if do not get better with  treatment. This information is not intended to replace advice given to you by your health care provider. Make sure you discuss any questions you have with your health care provider. Document Revised: 12/25/2017 Document Reviewed: 12/25/2017 Elsevier Patient Education  2020 [employer] Inc.  

## 2020-06-17 NOTE — Progress Notes (Signed)
Having issues with vaginal discharge, denies odor

## 2020-06-18 ENCOUNTER — Inpatient Hospital Stay: Payer: PPO

## 2020-06-18 ENCOUNTER — Other Ambulatory Visit: Payer: Self-pay

## 2020-06-18 ENCOUNTER — Inpatient Hospital Stay: Payer: PPO | Attending: Hematology and Oncology | Admitting: Hematology and Oncology

## 2020-06-18 ENCOUNTER — Encounter: Payer: Self-pay | Admitting: Hematology and Oncology

## 2020-06-18 VITALS — BP 142/67 | HR 60 | Temp 97.4°F | Wt 152.0 lb

## 2020-06-18 DIAGNOSIS — Z79810 Long term (current) use of selective estrogen receptor modulators (SERMs): Secondary | ICD-10-CM | POA: Diagnosis not present

## 2020-06-18 DIAGNOSIS — Z85820 Personal history of malignant melanoma of skin: Secondary | ICD-10-CM | POA: Diagnosis not present

## 2020-06-18 DIAGNOSIS — I471 Supraventricular tachycardia: Secondary | ICD-10-CM | POA: Diagnosis not present

## 2020-06-18 DIAGNOSIS — I10 Essential (primary) hypertension: Secondary | ICD-10-CM | POA: Insufficient documentation

## 2020-06-18 DIAGNOSIS — Z923 Personal history of irradiation: Secondary | ICD-10-CM | POA: Insufficient documentation

## 2020-06-18 DIAGNOSIS — K449 Diaphragmatic hernia without obstruction or gangrene: Secondary | ICD-10-CM | POA: Diagnosis not present

## 2020-06-18 DIAGNOSIS — Z803 Family history of malignant neoplasm of breast: Secondary | ICD-10-CM | POA: Diagnosis not present

## 2020-06-18 DIAGNOSIS — M25559 Pain in unspecified hip: Secondary | ICD-10-CM | POA: Diagnosis not present

## 2020-06-18 DIAGNOSIS — M129 Arthropathy, unspecified: Secondary | ICD-10-CM | POA: Insufficient documentation

## 2020-06-18 DIAGNOSIS — R252 Cramp and spasm: Secondary | ICD-10-CM | POA: Diagnosis not present

## 2020-06-18 DIAGNOSIS — Z79899 Other long term (current) drug therapy: Secondary | ICD-10-CM | POA: Insufficient documentation

## 2020-06-18 DIAGNOSIS — Z170 Estrogen receptor positive status [ER+]: Secondary | ICD-10-CM | POA: Insufficient documentation

## 2020-06-18 DIAGNOSIS — K219 Gastro-esophageal reflux disease without esophagitis: Secondary | ICD-10-CM | POA: Diagnosis not present

## 2020-06-18 DIAGNOSIS — D0512 Intraductal carcinoma in situ of left breast: Secondary | ICD-10-CM

## 2020-06-18 DIAGNOSIS — M85852 Other specified disorders of bone density and structure, left thigh: Secondary | ICD-10-CM | POA: Diagnosis not present

## 2020-06-18 LAB — CBC WITH DIFFERENTIAL/PLATELET
Abs Immature Granulocytes: 0.02 10*3/uL (ref 0.00–0.07)
Basophils Absolute: 0.1 10*3/uL (ref 0.0–0.1)
Basophils Relative: 1 %
Eosinophils Absolute: 0.1 10*3/uL (ref 0.0–0.5)
Eosinophils Relative: 3 %
HCT: 44.8 % (ref 36.0–46.0)
Hemoglobin: 14.8 g/dL (ref 12.0–15.0)
Immature Granulocytes: 0 %
Lymphocytes Relative: 21 %
Lymphs Abs: 1.2 10*3/uL (ref 0.7–4.0)
MCH: 29.8 pg (ref 26.0–34.0)
MCHC: 33 g/dL (ref 30.0–36.0)
MCV: 90.1 fL (ref 80.0–100.0)
Monocytes Absolute: 0.6 10*3/uL (ref 0.1–1.0)
Monocytes Relative: 11 %
Neutro Abs: 3.6 10*3/uL (ref 1.7–7.7)
Neutrophils Relative %: 64 %
Platelets: 261 10*3/uL (ref 150–400)
RBC: 4.97 MIL/uL (ref 3.87–5.11)
RDW: 13.2 % (ref 11.5–15.5)
WBC: 5.6 10*3/uL (ref 4.0–10.5)
nRBC: 0 % (ref 0.0–0.2)

## 2020-06-18 LAB — COMPREHENSIVE METABOLIC PANEL
ALT: 21 U/L (ref 0–44)
AST: 23 U/L (ref 15–41)
Albumin: 4.1 g/dL (ref 3.5–5.0)
Alkaline Phosphatase: 41 U/L (ref 38–126)
Anion gap: 7 (ref 5–15)
BUN: 18 mg/dL (ref 8–23)
CO2: 27 mmol/L (ref 22–32)
Calcium: 8.9 mg/dL (ref 8.9–10.3)
Chloride: 104 mmol/L (ref 98–111)
Creatinine, Ser: 0.83 mg/dL (ref 0.44–1.00)
GFR calc Af Amer: >60 mL/min (ref >60)
GFR calc non Af Amer: >60 mL/min (ref >60)
Glucose, Bld: 79 mg/dL (ref 70–99)
Potassium: 4.8 mmol/L (ref 3.5–5.1)
Sodium: 138 mmol/L (ref 135–145)
Total Bilirubin: 1.1 mg/dL (ref 0.3–1.2)
Total Protein: 6.9 g/dL (ref 6.5–8.1)

## 2020-06-18 LAB — CERVICOVAGINAL ANCILLARY ONLY
Bacterial Vaginitis (gardnerella): NEGATIVE
Candida Glabrata: NEGATIVE
Candida Vaginitis: NEGATIVE
Comment: NEGATIVE
Comment: NEGATIVE
Comment: NEGATIVE

## 2020-06-18 NOTE — Progress Notes (Signed)
No new changes noted today 

## 2020-06-22 ENCOUNTER — Ambulatory Visit: Payer: PPO

## 2020-06-23 ENCOUNTER — Encounter: Payer: PPO | Admitting: Family Medicine

## 2020-07-21 ENCOUNTER — Other Ambulatory Visit: Payer: Self-pay

## 2020-08-26 ENCOUNTER — Ambulatory Visit: Payer: PPO

## 2020-09-02 ENCOUNTER — Other Ambulatory Visit: Payer: Self-pay

## 2020-09-02 ENCOUNTER — Encounter: Payer: Self-pay | Admitting: Dermatology

## 2020-09-02 ENCOUNTER — Ambulatory Visit: Payer: PPO | Admitting: Dermatology

## 2020-09-02 DIAGNOSIS — L719 Rosacea, unspecified: Secondary | ICD-10-CM

## 2020-09-02 DIAGNOSIS — Z853 Personal history of malignant neoplasm of breast: Secondary | ICD-10-CM | POA: Diagnosis not present

## 2020-09-02 DIAGNOSIS — Z1283 Encounter for screening for malignant neoplasm of skin: Secondary | ICD-10-CM | POA: Diagnosis not present

## 2020-09-02 DIAGNOSIS — L821 Other seborrheic keratosis: Secondary | ICD-10-CM | POA: Diagnosis not present

## 2020-09-02 DIAGNOSIS — L820 Inflamed seborrheic keratosis: Secondary | ICD-10-CM | POA: Diagnosis not present

## 2020-09-02 DIAGNOSIS — D1800 Hemangioma unspecified site: Secondary | ICD-10-CM | POA: Diagnosis not present

## 2020-09-02 DIAGNOSIS — D229 Melanocytic nevi, unspecified: Secondary | ICD-10-CM

## 2020-09-02 DIAGNOSIS — L814 Other melanin hyperpigmentation: Secondary | ICD-10-CM

## 2020-09-02 DIAGNOSIS — L578 Other skin changes due to chronic exposure to nonionizing radiation: Secondary | ICD-10-CM

## 2020-09-02 DIAGNOSIS — L570 Actinic keratosis: Secondary | ICD-10-CM | POA: Diagnosis not present

## 2020-09-02 DIAGNOSIS — L718 Other rosacea: Secondary | ICD-10-CM | POA: Diagnosis not present

## 2020-09-02 DIAGNOSIS — Z85828 Personal history of other malignant neoplasm of skin: Secondary | ICD-10-CM | POA: Diagnosis not present

## 2020-09-02 NOTE — Progress Notes (Signed)
Follow-Up Visit   Subjective  Susan [name] is a 69 y.o. female who presents for the following: Annual Exam (Hx BCC and SCC - patient has noticed some irritated skin lesions on her trunk that she would like treated). The patient presents for Total-Body Skin Exam (TBSE) for skin cancer screening and mole check.  The following portions of the chart were reviewed this encounter and updated as appropriate:  Tobacco  Allergies  Meds  Problems  Med Hx  Surg Hx  Fam Hx     Review of Systems:  No other skin or systemic complaints except as noted in HPI or Assessment and Plan.  Objective  Well appearing patient in no apparent distress; mood and affect are within normal limits.  A full examination was performed including scalp, head, eyes, ears, nose, lips, neck, chest, axillae, abdomen, back, buttocks, bilateral upper extremities, bilateral lower extremities, hands, feet, fingers, toes, fingernails, and toenails. All findings within normal limits unless otherwise noted below.  Objective  Back x 2, R clavicle x 1, L breast x 1, L axilla x 1, L arm x 2 (7): Erythematous keratotic or waxy stuck-on papule or plaque.   Objective  Left Breast: Clear to visual exam and palpation. No lymphadenopathy.  Objective  L arm x 1: Erythematous thin papules/macules with gritty scale.   Assessment & Plan  Rosacea Face Erythrotelangiectatic type - benign, observe, discussed cosmetic laser treatment.   Inflamed seborrheic keratosis (7) Back x 2, R clavicle x 1, L breast x 1, L axilla x 1, L arm x 2  Destruction of lesion - Back x 2, R clavicle x 1, L breast x 1, L axilla x 1, L arm x 2 Complexity: simple   Destruction method: cryotherapy   Informed consent: discussed and consent obtained   Timeout:  patient name, date of birth, surgical site, and procedure verified Lesion destroyed using liquid nitrogen: Yes   Region frozen until ice ball extended beyond lesion: Yes   Outcome: patient  tolerated procedure well with no complications   Post-procedure details: wound care instructions given    History of breast cancer in female Left Breast No lymphadenopathy Clear. Observe for recurrence. Call clinic for new or changing lesions.  Recommend regular skin exams, daily broad-spectrum spf 30+ sunscreen use, and photoprotection.    AK (actinic keratosis) L arm x 1  Destruction of lesion - L arm x 1 Complexity: simple   Destruction method: cryotherapy   Informed consent: discussed and consent obtained   Timeout:  patient name, date of birth, surgical site, and procedure verified Lesion destroyed using liquid nitrogen: Yes   Region frozen until ice ball extended beyond lesion: Yes   Outcome: patient tolerated procedure well with no complications   Post-procedure details: wound care instructions given     Lentigines - Scattered tan macules - Discussed due to sun exposure - Benign, observe - Call for any changes  Seborrheic Keratoses - Stuck-on, waxy, tan-brown papules and plaques  - Discussed benign etiology and prognosis. - Observe - Call for any changes  Melanocytic Nevi - Tan-brown and/or pink-flesh-colored symmetric macules and papules - Benign appearing on exam today - Observation - Call clinic for new or changing moles - Recommend daily use of broad spectrum spf 30+ sunscreen to sun-exposed areas.   Hemangiomas - Red papules - Discussed benign nature - Observe - Call for any changes  Actinic Damage - diffuse scaly erythematous macules with underlying dyspigmentation - Recommend daily broad spectrum sunscreen SPF  30+ to sun-exposed areas, reapply every 2 hours as needed.  - Call for new or changing lesions.  Skin cancer screening performed today.  Return in about 1 year (around 09/02/2021) for TBSE.  Luther Redo, CMA, am acting as scribe for Sarina Ser, MD .  Documentation: I have reviewed the above documentation for accuracy and completeness,  and I agree with the above.  Sarina Ser, MD

## 2020-09-04 ENCOUNTER — Other Ambulatory Visit: Payer: Self-pay | Admitting: Hematology and Oncology

## 2020-09-04 DIAGNOSIS — D0512 Intraductal carcinoma in situ of left breast: Secondary | ICD-10-CM

## 2020-09-08 ENCOUNTER — Encounter: Payer: PPO | Admitting: Family Medicine

## 2020-11-10 ENCOUNTER — Ambulatory Visit: Payer: PPO

## 2020-11-13 NOTE — Progress Notes (Signed)
Cardiology Office Note  Date:  11/16/2020   ID:  Susan [name], DOB Mar 03, 1951, MRN [id number]  PCP:  Lesleigh Noe, MD   Chief Complaint  Patient presents with  . Annual Exam    Pt states no new Sx.    HPI:  Susan [name] is a 69 y.o. female  Mild to moderate mitral valve regurgitation Nonsmoker Nondiabetic coronary CT for calcium scoring, revealing a calcium score of 0. History of nonsustained VT 9 beats seen on event monitor 10 runs of atrial tachycardia/supraventricular tachycardia on event monitor Who presents for follow-up of her arrhythmia  Last seen in clinic November 2019 Breast CA XRT finished sept 6th 2019 Left breast lumpectomy defect, right breast asymmetry Underwent breast surgery January 2021  Seen by one of our providers November 2020 In follow-up she denies any significant tachycardia palpitations Overall doing well, no complaints, no chest pain or shortness of breath, no leg edema no PND orthopnea no signs of fluid retention No near syncope or syncope  echocardiogram April 2019 Mild to moderate MR April 2019  EKG personally reviewed by myself on todays visit Shows sinus bradycardia rate 61 bpm no significant  ST or T wave changes   Other past medical history reviewed Long-term monitor 04/25/2017: Overall rhythm was sinus.  One run of slightly wide complex tachycardia, 8 beats at average of 179 BPM.   10 episodes of supraventricular tachycardia. Fastest was 16 seconds at 148 BPM. The longest was 42.8 seconds at 138 BPM. One was with a documented symptom. No evidence of significant pauses or AV block, or atrial fibrillation.  PMH:   has a past medical history of Arthritis, Basal cell carcinoma (05/26/2016), Basal cell carcinoma (01/21/2015), Breast cancer ([place]), Cancer ([place]), Complication of anesthesia, Dysrhythmia, Family history of breast cancer, Gastric reflux (90's), GERD (gastroesophageal reflux disease), Headache, Hiatal hernia (2003),  History of Coronary CT for Calcium Scoring, Hypertension, Mitral regurgitation, Personal history of radiation therapy (2019), PONV (postoperative nausea and vomiting), PSVT (paroxysmal supraventricular tachycardia) ([place]), and Squamous cell carcinoma of skin (10/31/2018).  PSH:    Past Surgical History:  Procedure Laterality Date  . AUGMENTATION MAMMAPLASTY Bilateral 01/17/2020  . BREAST BIOPSY Left 06/13/2018   Affirm Bx- coil clip   atypical sclerosing and DCIS  . BREAST ENHANCEMENT SURGERY Bilateral 01/17/2020   Procedure: MAMMOPLASTY AUGMENTATION WITH PROSTHESIS (BREAST);  Surgeon: Cindra Presume, MD;  Location: [facility];  Service: Plastics;  Laterality: Bilateral;  2.5 hours for total case  . BREAST EXCISIONAL BIOPSY Right 2000   benign  . BREAST LUMPECTOMY Left 06/26/2018   atypical sclerosing lesion and DCIS bracketing  . BREAST LUMPECTOMY WITH NEEDLE LOCALIZATION Left 06/26/2018   Procedure: BREAST LUMPECTOMY WITH NEEDLE LOCALIZATION;  Surgeon: Jules Husbands, MD;  Location: [facility];  Service: General;  Laterality: Left;  . CHOLECYSTECTOMY    . OVARIAN CYST SURGERY    . SCAR REVISION Left 01/17/2020   Procedure: POSSIBLE SCAR REVISION TO LEFT BREAST;  Surgeon: Cindra Presume, MD;  Location: [facility];  Service: Plastics;  Laterality: Left;  . SKIN FULL THICKNESS GRAFT  01/17/2020   Procedure: SKIN GRAFT FULL THICKNESS;  Surgeon: Cindra Presume, MD;  Location: [facility];  Service: Plastics;;  . TONSILLECTOMY     age 67  . UPPER GI ENDOSCOPY  2003    Current Outpatient Medications  Medication Sig Dispense Refill  . acetaminophen (TYLENOL) 500 MG tablet Take 1,000 mg by mouth every 6 (  six) hours as needed.    . ALPRAZolam (XANAX) 0.5 MG tablet Take 1 tablet (0.5 mg total) by mouth at bedtime as needed. for sleep 30 tablet 1  . calcium citrate-vitamin D 500-500 MG-UNIT chewable tablet Chew 1 tablet by mouth 2 (two) times daily.    .  cholecalciferol (VITAMIN D) 1000 units tablet Take 1,000 Units by mouth daily.    . famotidine (PEPCID) 20 MG tablet Take 20 mg by mouth as needed for heartburn or indigestion.    . fluticasone (FLONASE) 50 MCG/ACT nasal spray Place 2 sprays into both nostrils as needed.     . metoprolol succinate (TOPROL-XL) 25 MG 24 hr tablet Take 0.5 tablets (12.5 mg total) by mouth daily. 45 tablet 3  . metroNIDAZOLE (METROGEL) 1 % gel Apply 1 application topically as needed.    Marland Kitchen NAPROXEN PO Take 1 tablet by mouth every 12 (twelve) hours as needed (pain).    . psyllium (TGT PSYLLIUM FIBER) 0.52 g capsule Take 0.52 g by mouth 3 (three) times daily.     Marland Kitchen SIMETHICONE-80 PO Take 80 mg by mouth as needed (gas).     . tamoxifen (NOLVADEX) 20 MG tablet TAKE 1 TABLET BY MOUTH EVERY DAY 90 tablet 3   No current facility-administered medications for this visit.     Allergies:   Patient has no known allergies.   Social History:  The patient  reports that she has never smoked. She has never used smokeless tobacco. She reports current alcohol use of about 2.0 standard drinks of alcohol per week. She reports that she does not use drugs.   Family History:   family history includes Breast cancer (age of onset: 51) in her cousin; Breast cancer (age of onset: 66) in her cousin; Breast cancer (age of onset: 14) in her cousin and cousin; COPD in her mother; Healthy in her brother; Heart failure in her mother; Hyperlipidemia in her mother; Hypertension in her brother, father, mother, and sister; Melanoma in her paternal uncle; Stroke (age of onset: 60) in her father; Testicular cancer (age of onset: 63) in an other family member.    Review of Systems: Review of Systems  Constitutional: Negative.   HENT: Negative.   Respiratory: Negative.   Cardiovascular: Negative.   Gastrointestinal: Negative.   Musculoskeletal: Negative.   Neurological: Negative.   Psychiatric/Behavioral: Negative.   All other systems reviewed and  are negative.   PHYSICAL EXAM: VS:  BP 136/78   Pulse 61   Ht 5\' 7"  (1.702 m)   Wt 152 lb (68.9 kg)   BMI 23.81 kg/m  , BMI Body mass index is 23.81 kg/m. Constitutional:  oriented to person, place, and time. No distress.  HENT:  Head: Grossly normal Eyes:  no discharge. No scleral icterus.  Neck: No JVD, no carotid bruits  Cardiovascular: Regular rate and rhythm, no murmurs appreciated Pulmonary/Chest: Clear to auscultation bilaterally, no wheezes or rails Abdominal: Soft.  no distension.  no tenderness.  Musculoskeletal: Normal range of motion Neurological:  normal muscle tone. Coordination normal. No atrophy Skin: Skin warm and dry Psychiatric: normal affect, pleasant  Recent Labs: 06/18/2020: ALT 21; BUN 18; Creatinine, Ser 0.83; Hemoglobin 14.8; Platelets 261; Potassium 4.8; Sodium 138    Lipid Panel Lab Results  Component Value Date   CHOL 202 (H) 05/30/2018   HDL 66.00 05/30/2018   LDLCALC 122 (H) 05/30/2018   TRIG 72.0 05/30/2018      Wt Readings from Last 3 Encounters:  11/16/20 152  lb (68.9 kg)  06/18/20 152 lb 0.1 oz (68.9 kg)  06/17/20 152 lb (68.9 kg)     ASSESSMENT AND PLAN:  Essential hypertension Blood pressure is well controlled on today's visit. No changes made to the medications.  Palpitations - Plan: EKG 12-Lead Recommend she start metoprolol succinate 12.5 mg daily No recent breakthrough tachycardia spells If she has any recurrent episodes on the metoprolol, recommend she take extra half dose of metoprolol Additional refills for metoprolol could come through primary care to allow her to be seen as needed in our office as she is doing so well  Atrial tachycardia ([place]) - Plan: EKG 12-Lead Denies any significant symptoms, well controlled on low-dose metoprolol succinate  Non-rheumatic mitral regurgitation No significant murmur on exam Previously with mild to moderate MR April 2019 Suspect no progression based on clinical exam  Run of  nonsustained VT  previously seen on monitor Continue low-dose metoprolol No symptoms  Breast cancer with radiation Completed bilateral surgery for cosmetic reasons January 2021     Total encounter time more than 25 minutes  Greater than 50% was spent in counseling and coordination of care with the patient    Orders Placed This Encounter  Procedures  . EKG 12-Lead     Signed, Esmond Plants, M.D., Ph.D. 11/16/2020  [place], [place]

## 2020-11-16 ENCOUNTER — Other Ambulatory Visit: Payer: Self-pay

## 2020-11-16 ENCOUNTER — Ambulatory Visit: Payer: PPO | Admitting: Cardiovascular Disease

## 2020-11-16 ENCOUNTER — Encounter: Payer: Self-pay | Admitting: Cardiovascular Disease

## 2020-11-16 VITALS — BP 136/78 | HR 61 | Ht 67.0 in | Wt 152.0 lb

## 2020-11-16 DIAGNOSIS — I340 Nonrheumatic mitral (valve) insufficiency: Secondary | ICD-10-CM | POA: Diagnosis not present

## 2020-11-16 DIAGNOSIS — I10 Essential (primary) hypertension: Secondary | ICD-10-CM

## 2020-11-16 DIAGNOSIS — I4719 Other supraventricular tachycardia: Secondary | ICD-10-CM

## 2020-11-16 DIAGNOSIS — I471 Supraventricular tachycardia, unspecified: Secondary | ICD-10-CM

## 2020-11-16 NOTE — Patient Instructions (Signed)
Medication Instructions:  No changes  If you need a refill on your cardiac medications before your next appointment, please call your pharmacy.    Lab work: No new labs needed   If you have labs (blood work) drawn today and your tests are completely normal, you will receive your results only by: . MyChart Message (if you have MyChart) OR . A paper copy in the mail If you have any lab test that is abnormal or we need to change your treatment, we will call you to review the results.   Testing/Procedures: No new testing needed   Follow-Up: At [facility], you and your health needs are our priority.  As part of our continuing mission to provide you with exceptional heart care, we have created designated Provider Care Teams.  These Care Teams include your primary Cardiologist (physician) and Advanced Practice Providers (APPs -  Physician Assistants and Nurse Practitioners) who all work together to provide you with the care you need, when you need it.  . You will need a follow up appointment as needed  . Providers on your designated Care Team:   . Christopher Berge, NP . Ryan Dunn, PA-C . Jacquelyn Visser, PA-C  Any Other Special Instructions Will Be Listed Below (If Applicable).  COVID-19 Vaccine Information can be found at: [email or web address] For questions related to vaccine distribution or appointments, please email [email or web address] or call [phone].     

## 2020-11-21 IMAGING — MG MM  DIGITAL DIAGNOSTIC BREAST BILAT IMPLANT W/ TOMO W/ CAD
9 of 13 series · 9 of 29 positions shown · non-contrast
Comparison: Previous exam(s).

CLINICAL DATA: 68-year-old female for annual follow-up. History of
LEFT breast cancer and lumpectomy in 7694.

EXAM:
DIGITAL DIAGNOSTIC BILATERAL MAMMOGRAM WITH IMPLANTS, CAD AND TOMO
The patient has retropectoral implants. Standard and implant
displaced views were performed.

[R MLO]
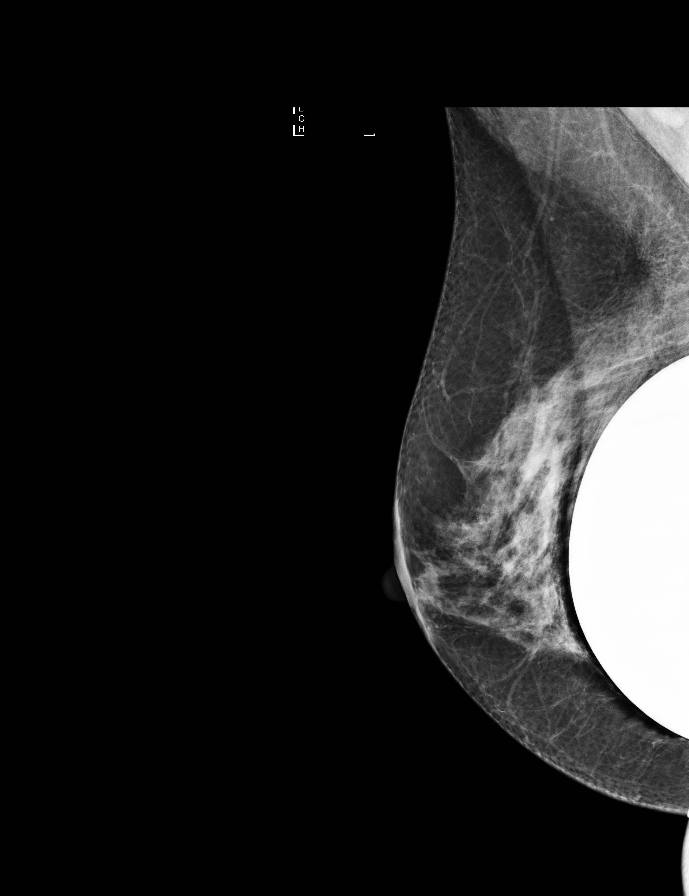

[R CC]
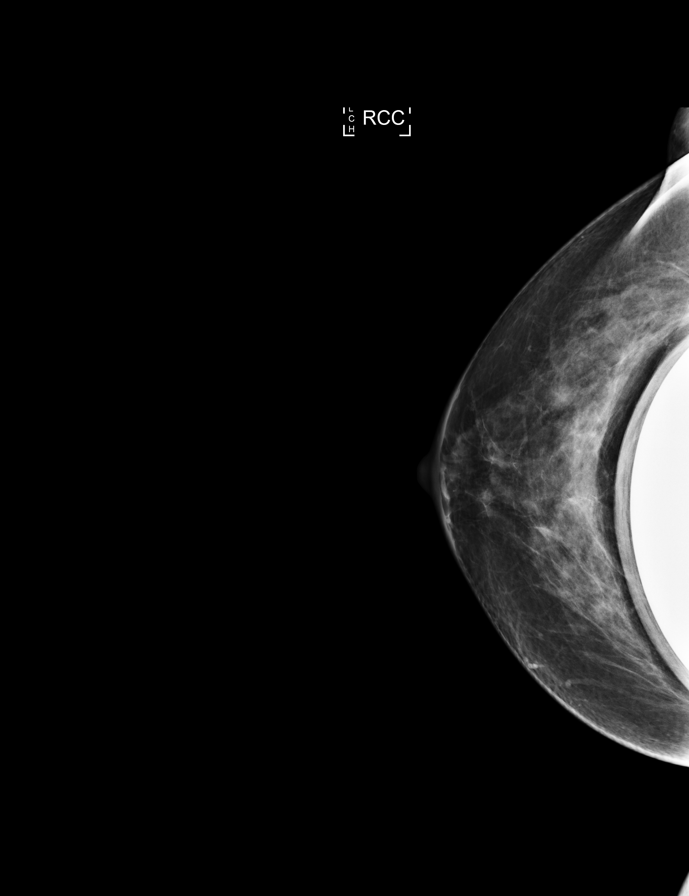

[L CC (1 of 2)]
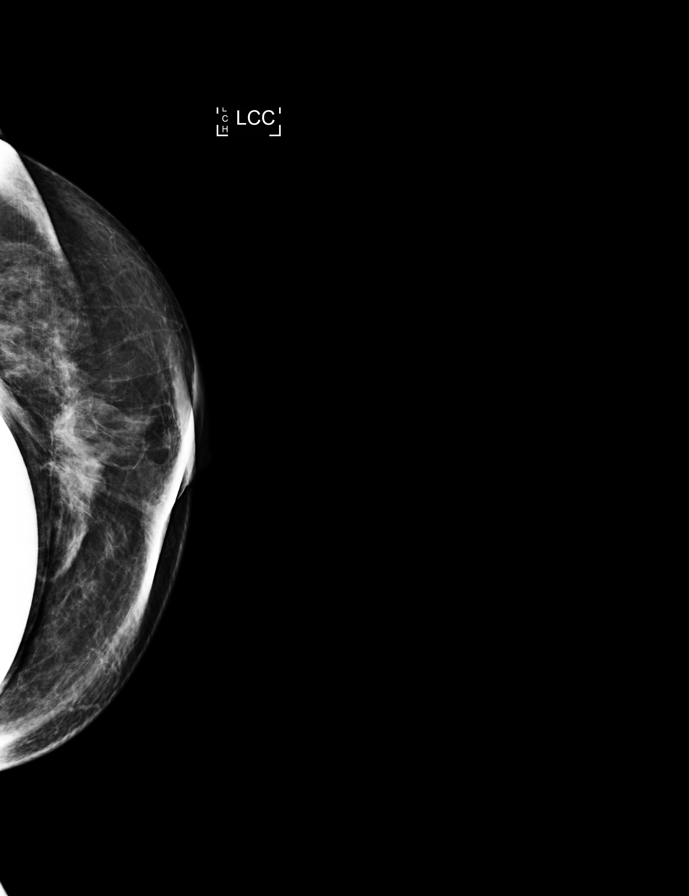

[L MLO]
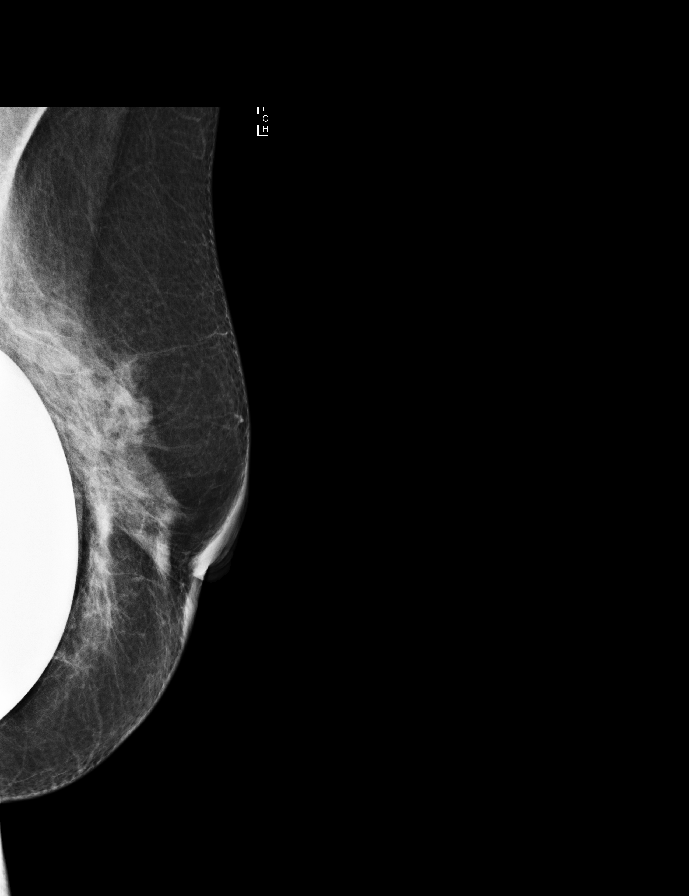

[L CC (2 of 2)]
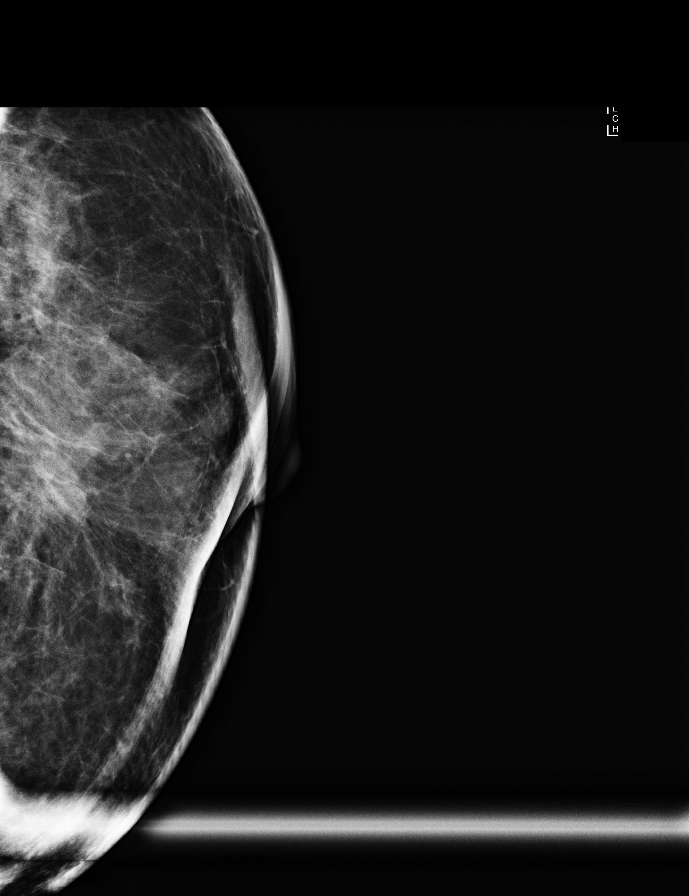

[L CC synth-2D]
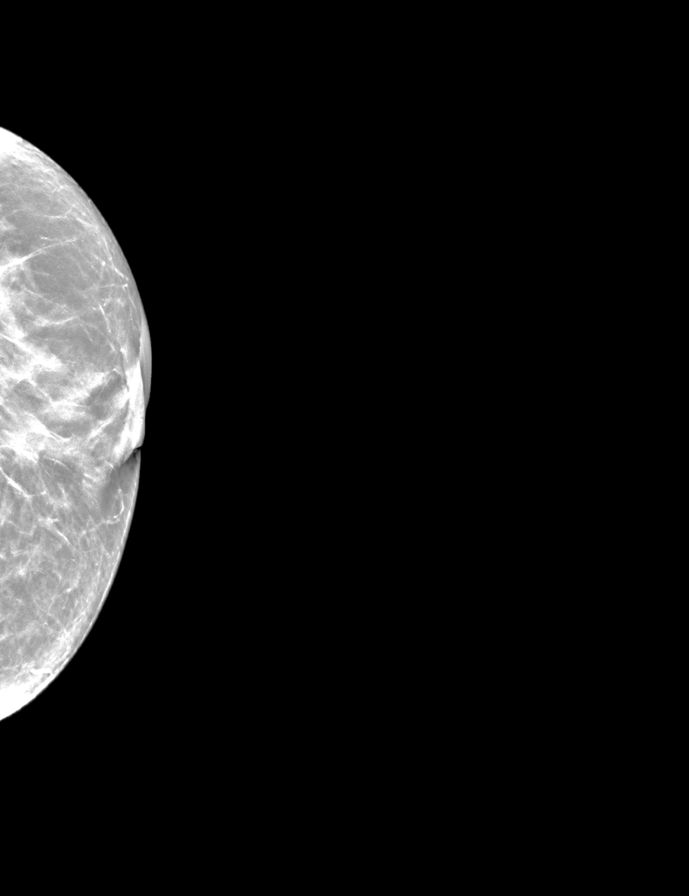

[L MLO synth-2D]
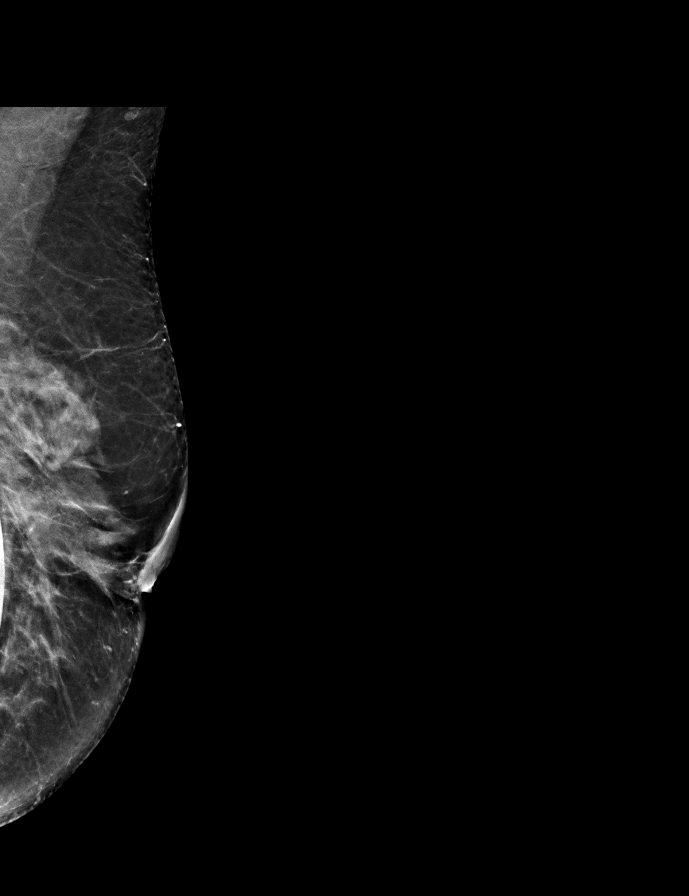

[R CC synth-2D]
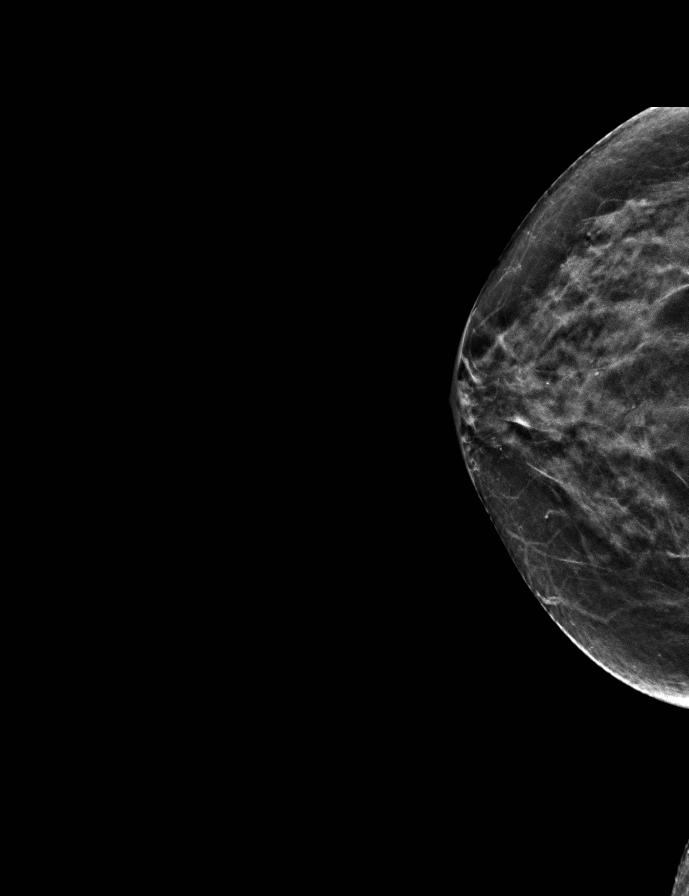

[R MLO synth-2D]
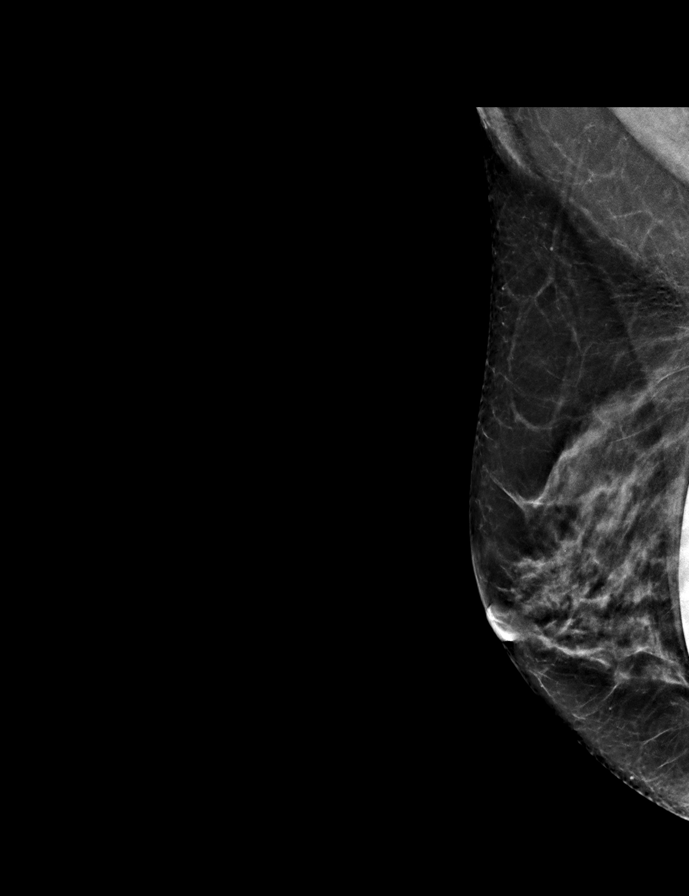

[9 of 29 positions shown; findings below may reference images not displayed]

ACR Breast Density Category c: The breast tissue is heterogeneously
dense, which may obscure small masses.
FINDINGS: 2D and 3D full field views of both breasts and a magnification view
of the lumpectomy site demonstrate no suspicious mass, nonsurgical
distortion or worrisome calcifications.

LEFT lumpectomy changes noted.

Mammographic images were processed with CAD.
IMPRESSION: No evidence of breast malignancy.

RECOMMENDATION:
Bilateral diagnostic mammogram in 1 year.

I have discussed the findings and recommendations with the patient.
If applicable, a reminder letter will be sent to the patient
regarding the next appointment.

BI-RADS CATEGORY  2: Benign.

## 2020-11-23 ENCOUNTER — Other Ambulatory Visit: Payer: Self-pay

## 2020-11-23 MED ORDER — METOPROLOL SUCCINATE ER 25 MG PO TB24: 12.5000 mg | ORAL_TABLET | Freq: Every day | ORAL | 3 refills | Status: DC

## 2020-11-23 NOTE — Telephone Encounter (Signed)
Rx request sent to pharmacy.  

## 2020-11-23 NOTE — Telephone Encounter (Signed)
This is a [place] pt, Dr. Gollan 

## 2020-12-01 MED FILL — TAMOXIFEN 20 MG TABLET: 90 days supply | Qty: 90 | Fill #1

## 2020-12-15 NOTE — Progress Notes (Signed)
[facility]  [address] [place], [place] [address] Phone: [phone]  Fax: [phone]   Clinic Day:  12/17/2020  Referring physician: Lesleigh Noe, MD  Chief Complaint: Susan [name] is a 69 y.o. 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  HPI: The patient was last seen in the medical oncology clinic on 06/18/2020. At that time, she was doing well.  She denied any breast concerns.  Exam was unremarkable. Hematocrit was 44.8, hemoglobin 14.8, platelets 261,000, WBC 5,600. CMP was normal. She continued tamoxifen, calcium, and vitamin D.  The patient saw Dr. Rockey Situ on 11/16/2020. She reported palpitations. She was to begin metoprolol succinate. She was to follow up prn.  During the interim, she has been fine. She has had vaginal discharge for about a year and tried several OTC medications. She saw her PCP who put her on an antifungal medication, though her vaginal swab was normal. She was referred to an OBGYN in 05/2020 who did another swab that came back negative. The OBGYN put her on metronidazole but she is still having discharge. The patient denies any vaginal bleeding. We discussed holding tamoxifen to see if her symptoms resolved and the patient would like to think about it.   The patient still has leg cramps at night. She has on and off bilateral hip pain.   Past Medical History:  Diagnosis Date  . Arthritis   . Basal cell carcinoma 05/26/2016   R chest parasternal  . Basal cell carcinoma 01/21/2015   L nose supratip   . Breast cancer ([place])    Left DCIS  . Cancer ([place])   . Complication of anesthesia   . Dysrhythmia    PSVT on metoprolol  . Family history of breast cancer   . Gastric reflux 90's  . GERD (gastroesophageal reflux disease)   . Headache    H/O MIGRAINES  . Hiatal hernia 2003  . History of Coronary CT for Calcium Scoring    a. 11/2018 CT Cor Ca2+ = Zero.  Marland Kitchen Hypertension   . Mitral  regurgitation    a. 03/2017 Echo: EF 55-60%, no nrwma, Mod MR; b. 03/2018 Echo: EF 60-65%, no rwma. Gr2 DD. Mild MV prolapse of ant leaflet w/ mild to mod mR. Nl RV fxn. Mild to mod TR.  Marland Kitchen Personal history of radiation therapy 2019   LEFT lumpectomy  . PONV (postoperative nausea and vomiting)    N/V  . PSVT (paroxysmal supraventricular tachycardia) ([place])    a. 03/2017 Event Monitor: Avg HR 66, 10 episodes of SVT up to 42.8 secs, fastest 148 bpm. 8 beats WCT (179 bpm) - VT vs SVT w/ aberrancy. Rare PACs (2.6% total beats).  . 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   R prox lat bicep    Past Surgical History:  Procedure Laterality Date  . AUGMENTATION MAMMAPLASTY Bilateral 01/17/2020  . BREAST BIOPSY Left 06/13/2018   Affirm Bx- coil clip   atypical sclerosing and DCIS  . BREAST ENHANCEMENT SURGERY Bilateral 01/17/2020   Procedure: MAMMOPLASTY AUGMENTATION WITH PROSTHESIS (BREAST);  Surgeon: Cindra Presume, MD;  Location: [facility];  Service: Plastics;  Laterality: Bilateral;  2.5 hours for total case  . BREAST EXCISIONAL BIOPSY Right 2000   benign  . BREAST LUMPECTOMY Left 06/26/2018   atypical sclerosing lesion and DCIS bracketing  . BREAST LUMPECTOMY WITH NEEDLE LOCALIZATION Left 06/26/2018   Procedure: BREAST LUMPECTOMY WITH NEEDLE LOCALIZATION;  Surgeon: Caroleen Hamman  F, MD;  Location: [facility];  Service: General;  Laterality: Left;  . CHOLECYSTECTOMY    . OVARIAN CYST SURGERY    . SCAR REVISION Left 01/17/2020   Procedure: POSSIBLE SCAR REVISION TO LEFT BREAST;  Surgeon: Cindra Presume, MD;  Location: [facility];  Service: Plastics;  Laterality: Left;  . SKIN FULL THICKNESS GRAFT  01/17/2020   Procedure: SKIN GRAFT FULL THICKNESS;  Surgeon: Cindra Presume, MD;  Location: [facility];  Service: Plastics;;  . TONSILLECTOMY     age 60  . UPPER GI ENDOSCOPY  2003    Family History  Problem Relation Age of Onset  . Hyperlipidemia Mother    . Hypertension Mother   . COPD Mother   . Heart failure Mother        deceased 48  . Stroke Father 51  . Hypertension Father   . Hypertension Sister   . Hypertension Brother   . Healthy Brother   . Breast cancer Cousin 57       daughter of paternal aunt; currently 77  . Breast cancer Cousin 12       daughter of paternal uncle; currently 8  . Breast cancer Cousin 44       daughter of maternal uncle; deceased 56  . Testicular cancer Other 5       sister's son; currently 9  . Melanoma Paternal Uncle   . Breast cancer Cousin 68       daughter of same maternal uncle; currently 87    Social History:  reports that she has never smoked. She has never used smokeless tobacco. She reports current alcohol use of about 2.0 standard drinks of alcohol per week. She reports that she does not use drugs. She denies tobacco use. She drinks wine with dinner. Patient is a [place] native, however has spent the last several years in [place]. Patient is a retired Administrator, arts. Patient denies known exposures to radiation on toxins. She has a golden doodle name Tharon Aquas. The patient is alone today.  Allergies: No Known Allergies  Current Medications: Current Outpatient Medications  Medication Sig Dispense Refill  . acetaminophen (TYLENOL) 500 MG tablet Take 1,000 mg by mouth every 6 (six) hours as needed.    . ALPRAZolam (XANAX) 0.5 MG tablet Take 1 tablet (0.5 mg total) by mouth at bedtime as needed. for sleep 30 tablet 1  . calcium citrate-vitamin D 500-500 MG-UNIT chewable tablet Chew 1 tablet by mouth 2 (two) times daily.    . cholecalciferol (VITAMIN D) 1000 units tablet Take 1,000 Units by mouth daily.    . famotidine (PEPCID) 20 MG tablet Take 20 mg by mouth as needed for heartburn or indigestion.    . fluticasone (FLONASE) 50 MCG/ACT nasal spray Place 2 sprays into both nostrils as needed.     . metoprolol succinate (TOPROL-XL) 25 MG 24 hr tablet Take 0.5 tablets (12.5 mg total) by mouth daily.  45 tablet 3  . metroNIDAZOLE (METROGEL) 1 % gel Apply 1 application topically as needed.    Marland Kitchen NAPROXEN PO Take 1 tablet by mouth every 12 (twelve) hours as needed (pain).    . psyllium (REGULOID) 0.52 g capsule Take 0.52 g by mouth 3 (three) times daily.     Marland Kitchen SIMETHICONE-80 PO Take 80 mg by mouth as needed (gas).     . tamoxifen (NOLVADEX) 20 MG tablet TAKE 1 TABLET BY MOUTH EVERY DAY 90 tablet 3   No current  facility-administered medications for this visit.     Review of Systems  Constitutional: Negative.  Negative for chills, diaphoresis, fever, malaise/fatigue and weight loss (up 4 lbs).       Feels "fine".  HENT: Negative.  Negative for congestion, ear discharge, ear pain, hearing loss, nosebleeds, sinus pain, sore throat and tinnitus.   Eyes: Negative for blurred vision and double vision.  Respiratory: Negative for cough, hemoptysis, sputum production and shortness of breath.   Cardiovascular: Negative.  Negative for chest pain, palpitations, claudication, leg swelling and PND.  Gastrointestinal: Negative.  Negative for abdominal pain, blood in stool, constipation, diarrhea, heartburn, melena, nausea and vomiting.  Genitourinary: Negative for dysuria, frequency, hematuria and urgency.       Vaginal discharge x 1 year.  Musculoskeletal: Positive for joint pain (on and off hips) and myalgias (nocturnal leg cramps). Negative for back pain and neck pain.  Skin: Negative.  Negative for itching and rash.  Neurological: Negative for dizziness, tingling, sensory change, weakness and headaches.  Endo/Heme/Allergies: Does not bruise/bleed easily.  Psychiatric/Behavioral: Negative for depression and memory loss. The patient is not nervous/anxious and does not have insomnia.   All other systems reviewed and are negative.  Performance status (ECOG): 0-1  Vitals Blood pressure (!) 144/58, pulse (!) 54, temperature 97.8 F (36.6 C), resp. rate 20, weight 156 lb 10.2 oz (71.1 kg), SpO2 98 %.    Physical Exam Vitals and nursing note reviewed.  Constitutional:      General: She is not in acute distress.    Appearance: She is well-developed. She is not diaphoretic.  HENT:     Head: Normocephalic and atraumatic.     Comments: Short brown hair.    Mouth/Throat:     Mouth: Mucous membranes are moist.     Pharynx: Oropharynx is clear.  Eyes:     General: No scleral icterus.    Conjunctiva/sclera: Conjunctivae normal.     Comments: Glasses.  Brown eyes.  Cardiovascular:     Rate and Rhythm: Normal rate and regular rhythm.     Pulses: Normal pulses.     Heart sounds: Normal heart sounds. No murmur heard.   Pulmonary:     Effort: Pulmonary effort is normal. No respiratory distress.     Breath sounds: Normal breath sounds. No wheezing or rales.  Chest:     Chest wall: No tenderness.  Breasts:     Right: No swelling, bleeding, mass, skin change, tenderness, axillary adenopathy or supraclavicular adenopathy.     Left: No swelling, bleeding, mass, skin change, tenderness, axillary adenopathy or supraclavicular adenopathy.      Comments: S/p bilateral mammoplasty augmentation Abdominal:     General: Bowel sounds are normal. There is no distension.     Palpations: Abdomen is soft. There is no mass.     Tenderness: There is no abdominal tenderness. There is no guarding or rebound.  Musculoskeletal:        General: No swelling or tenderness. Normal range of motion.     Cervical back: Normal range of motion and neck supple.  Lymphadenopathy:     Head:     Right side of head: No preauricular, posterior auricular or occipital adenopathy.     Left side of head: No preauricular, posterior auricular or occipital adenopathy.     Upper Body:     Right upper body: No supraclavicular or axillary adenopathy.     Left upper body: No supraclavicular or axillary adenopathy.     Lower Body:  No right inguinal adenopathy. No left inguinal adenopathy.  Skin:    General: Skin is warm and dry.   Neurological:     Mental Status: She is alert and oriented to person, place, and time. Mental status is at baseline.  Psychiatric:        Behavior: Behavior normal.        Thought Content: Thought content normal.        Judgment: Judgment normal.    No visits with results within 3 Day(s) from this visit.  Latest known visit with results is:  Appointment on 06/18/2020  Component Date Value Ref Range Status  . Sodium 06/18/2020 138  135 - 145 mmol/L Final  . Potassium 06/18/2020 4.8  3.5 - 5.1 mmol/L Final  . Chloride 06/18/2020 104  98 - 111 mmol/L Final  . CO2 06/18/2020 27  22 - 32 mmol/L Final  . Glucose, Bld 06/18/2020 79  70 - 99 mg/dL Final   Glucose reference range applies only to samples taken after fasting for at least 8 hours.  . BUN 06/18/2020 18  8 - 23 mg/dL Final  . Creatinine, Ser 06/18/2020 0.83  0.44 - 1.00 mg/dL Final  . Calcium 06/18/2020 8.9  8.9 - 10.3 mg/dL Final  . Total Protein 06/18/2020 6.9  6.5 - 8.1 g/dL Final  . Albumin 06/18/2020 4.1  3.5 - 5.0 g/dL Final  . AST 06/18/2020 23  15 - 41 U/L Final  . ALT 06/18/2020 21  0 - 44 U/L Final  . Alkaline Phosphatase 06/18/2020 41  38 - 126 U/L Final  . Total Bilirubin 06/18/2020 1.1  0.3 - 1.2 mg/dL Final  . GFR calc non Af Amer 06/18/2020 >60  >60 mL/min Final  . GFR calc Af Amer 06/18/2020 >60  >60 mL/min Final  . Anion gap 06/18/2020 7  5 - 15 Final   Performed at [facility], [address] [place], [place] [address]  . WBC 06/18/2020 5.6  4.0 - 10.5 K/uL Final  . RBC 06/18/2020 4.97  3.87 - 5.11 MIL/uL Final  . Hemoglobin 06/18/2020 14.8  12.0 - 15.0 g/dL Final  . HCT 06/18/2020 44.8  36.0 - 46.0 % Final  . MCV 06/18/2020 90.1  80.0 - 100.0 fL Final  . MCH 06/18/2020 29.8  26.0 - 34.0 pg Final  . MCHC 06/18/2020 33.0  30.0 - 36.0 g/dL Final  . RDW 06/18/2020 13.2  11.5 - 15.5 % Final  . Platelets 06/18/2020 261  150 - 400 K/uL Final  . nRBC 06/18/2020 0.0  0.0 - 0.2 % Final  .  Neutrophils Relative % 06/18/2020 64  % Final  . Neutro Abs 06/18/2020 3.6  1.7 - 7.7 K/uL Final  . Lymphocytes Relative 06/18/2020 21  % Final  . Lymphs Abs 06/18/2020 1.2  0.7 - 4.0 K/uL Final  . Monocytes Relative 06/18/2020 11  % Final  . Monocytes Absolute 06/18/2020 0.6  0.1 - 1.0 K/uL Final  . Eosinophils Relative 06/18/2020 3  % Final  . Eosinophils Absolute 06/18/2020 0.1  0.0 - 0.5 K/uL Final  . Basophils Relative 06/18/2020 1  % Final  . Basophils Absolute 06/18/2020 0.1  0.0 - 0.1 K/uL Final  . Immature Granulocytes 06/18/2020 0  % Final  . Abs Immature Granulocytes 06/18/2020 0.02  0.00 - 0.07 K/uL Final   Performed at [facility], [address], [place], [place] [address]    Assessment:  Susan [name] is a 69 y.o.  female with left breast DCISs/p left partial mastectomy with needle localization on 06/26/2018. Pathologyrevealed at least 28 mm grade 2-3 DCIS with sclerosing adenosis, cystic papillary apocrine metaplasia. There was central expansive comedonecrosis. Margins were negative (closest 1.7 mm). DCIS was ER positive (>90%) and PR negative (<1%). Pathologic stagewas pTis pNx.  Diagnostic left mammogramon 06/06/2018 revealed indeterminate microcalcifications over the inner midportion of the left breast. There were 2 adjacent groups of heterogeneous microcalcifications with some linear forms spanning 1.1 x 2.0 x 2.4 cm. Bilateral diagnostic mammogram on 06/16/2020 revealed no evidence of breast malignancy.  She receivedradiation(07/30/2018 - 08/20/2018) followed by electron beam treatment (08/21/2018 - 08/31/2018). She began tamoxifenon 09/05/2018.  She underwent mammoplastly augmentation with prosthesis on 01/17/2020.  Bone densityon 07/26/2018 revealed osteopenia with a T-score of -1.8 in the left femoral neck.  Bone density on 08/27/2019 revealed osteopenia with a T-score of -1.7 in the left femur neck.  Symptomatically,  she feels  "fine". She has vaginal discharge.  She denies any vaginal bleeding. Exam is stable.  Plan: 1.   Labs today: CBC with diff, CMP. 2.   Left breast DCIS Clinically, she continues to do well             Bilateral diagnostic mammogram on 06/16/2020 revealed no evidence of malignancy. She is s/p mammoplasty augmentation with prosthesis on  01/17/2020.  She remains on tamoxifen (began 09/05/2018).  Tamoxifen noted to have side effect of vaginal discharge 12-55%  Discuss consideration of holding tamoxifen briefly to see if symptoms resolve. Continue surveillance. 3.   Osteopenia Bone density on 08/27/2019 revealed stable osteopenia.  Continue calcium and vitamin D.  Anticipate follow-up bone density in 08/2021. 4.   Vaginal discharge  Suspect etiology is due to tamoxifen (risk 12-55%).  Patient denies any vaginal bleeding.  She has been treated with antifungals as well as Flagyl.  She has been seen by her primary care physician as well as gynecology. 5.   Bilateral mammogram 06/16/2021. 6.   Bone density on 08/26/2021. 7.   RTC in 6 months for MD assessment, labs (CBC with diff, CMP), and review of mammogram.  I discussed the assessment and treatment plan with the patient.  The patient was provided an opportunity to ask questions and all were answered.  The patient agreed with the plan and demonstrated an understanding of the instructions.  The patient was advised to call back if the symptoms worsen or if the condition fails to improve as anticipated.  I provided 16 minutes of face-to-face time during this this encounter and > 50% was spent counseling as documented under my assessment and plan.  An additional 5 minutes were spent reviewing her chart (Epic and Care Everywhere) including notes, labs, and imaging studies.    Lequita Asal, MD, PhD    12/17/2020, 10:19 AM  I, De Burrs, am acting as a scribe for Lequita Asal, MD.  I, [place] Mike Gip, MD, have reviewed the  above documentation for accuracy and completeness, and I agree with the above.

## 2020-12-17 ENCOUNTER — Inpatient Hospital Stay: Payer: PPO | Attending: Hematology and Oncology | Admitting: Hematology and Oncology

## 2020-12-17 ENCOUNTER — Inpatient Hospital Stay: Payer: PPO

## 2020-12-17 ENCOUNTER — Telehealth: Payer: Self-pay | Admitting: Hematology and Oncology

## 2020-12-17 ENCOUNTER — Encounter: Payer: Self-pay | Admitting: Hematology and Oncology

## 2020-12-17 ENCOUNTER — Other Ambulatory Visit: Payer: Self-pay

## 2020-12-17 VITALS — BP 144/58 | HR 54 | Temp 97.8°F | Resp 20 | Wt 156.6 lb

## 2020-12-17 DIAGNOSIS — Z923 Personal history of irradiation: Secondary | ICD-10-CM | POA: Diagnosis not present

## 2020-12-17 DIAGNOSIS — N898 Other specified noninflammatory disorders of vagina: Secondary | ICD-10-CM

## 2020-12-17 DIAGNOSIS — Z79899 Other long term (current) drug therapy: Secondary | ICD-10-CM | POA: Insufficient documentation

## 2020-12-17 DIAGNOSIS — D0512 Intraductal carcinoma in situ of left breast: Secondary | ICD-10-CM | POA: Insufficient documentation

## 2020-12-17 DIAGNOSIS — Z170 Estrogen receptor positive status [ER+]: Secondary | ICD-10-CM | POA: Diagnosis not present

## 2020-12-17 DIAGNOSIS — Z79810 Long term (current) use of selective estrogen receptor modulators (SERMs): Secondary | ICD-10-CM | POA: Insufficient documentation

## 2020-12-17 DIAGNOSIS — M85852 Other specified disorders of bone density and structure, left thigh: Secondary | ICD-10-CM | POA: Diagnosis not present

## 2020-12-17 LAB — CBC WITH DIFFERENTIAL/PLATELET
Abs Immature Granulocytes: 0.01 10*3/uL (ref 0.00–0.07)
Basophils Absolute: 0.1 10*3/uL (ref 0.0–0.1)
Basophils Relative: 1 %
Eosinophils Absolute: 0.2 10*3/uL (ref 0.0–0.5)
Eosinophils Relative: 3 %
HCT: 43.8 % (ref 36.0–46.0)
Hemoglobin: 14.5 g/dL (ref 12.0–15.0)
Immature Granulocytes: 0 %
Lymphocytes Relative: 25 %
Lymphs Abs: 1.4 10*3/uL (ref 0.7–4.0)
MCH: 29.6 pg (ref 26.0–34.0)
MCHC: 33.1 g/dL (ref 30.0–36.0)
MCV: 89.4 fL (ref 80.0–100.0)
Monocytes Absolute: 0.6 10*3/uL (ref 0.1–1.0)
Monocytes Relative: 11 %
Neutro Abs: 3.5 10*3/uL (ref 1.7–7.7)
Neutrophils Relative %: 60 %
Platelets: 278 10*3/uL (ref 150–400)
RBC: 4.9 MIL/uL (ref 3.87–5.11)
RDW: 13.4 % (ref 11.5–15.5)
WBC: 5.7 10*3/uL (ref 4.0–10.5)
nRBC: 0 % (ref 0.0–0.2)

## 2020-12-17 LAB — COMPREHENSIVE METABOLIC PANEL
ALT: 21 U/L (ref 0–44)
AST: 21 U/L (ref 15–41)
Albumin: 4 g/dL (ref 3.5–5.0)
Alkaline Phosphatase: 38 U/L (ref 38–126)
Anion gap: 8 (ref 5–15)
BUN: 19 mg/dL (ref 8–23)
CO2: 28 mmol/L (ref 22–32)
Calcium: 9.3 mg/dL (ref 8.9–10.3)
Chloride: 102 mmol/L (ref 98–111)
Creatinine, Ser: 0.78 mg/dL (ref 0.44–1.00)
GFR, Estimated: >60 mL/min (ref >60)
Glucose, Bld: 96 mg/dL (ref 70–99)
Potassium: 4.7 mmol/L (ref 3.5–5.1)
Sodium: 138 mmol/L (ref 135–145)
Total Bilirubin: 1.2 mg/dL (ref 0.3–1.2)
Total Protein: 6.8 g/dL (ref 6.5–8.1)

## 2021-01-09 DIAGNOSIS — N898 Other specified noninflammatory disorders of vagina: Secondary | ICD-10-CM | POA: Insufficient documentation

## 2021-01-14 ENCOUNTER — Ambulatory Visit
Admission: RE | Admit: 2021-01-14 | Discharge: 2021-01-14 | Disposition: A | Discharge status: Home / Self Care | Payer: PPO | Source: Ambulatory Visit | Attending: Radiation Oncology | Admitting: Radiation Oncology

## 2021-01-14 VITALS — BP 129/71 | HR 63 | Temp 97.0°F | Resp 16 | Wt 157.0 lb

## 2021-01-14 DIAGNOSIS — Z923 Personal history of irradiation: Secondary | ICD-10-CM | POA: Diagnosis not present

## 2021-01-14 DIAGNOSIS — Z79810 Long term (current) use of selective estrogen receptor modulators (SERMs): Secondary | ICD-10-CM | POA: Diagnosis not present

## 2021-01-14 DIAGNOSIS — Z170 Estrogen receptor positive status [ER+]: Secondary | ICD-10-CM | POA: Diagnosis not present

## 2021-01-14 DIAGNOSIS — Z08 Encounter for follow-up examination after completed treatment for malignant neoplasm: Secondary | ICD-10-CM | POA: Diagnosis not present

## 2021-01-14 DIAGNOSIS — D0512 Intraductal carcinoma in situ of left breast: Secondary | ICD-10-CM

## 2021-01-14 NOTE — Progress Notes (Signed)
Radiation Oncology Follow up Note  Name: Susan [name]   Date:   01/14/2021 MRN:  [id number] DOB: 04/15/1951    This 70 y.o. female presents to the clinic today for 2-year follow-up status post whole breast radiation to her left breast for ER/PR positive ductal carcinoma in situ.  REFERRING PROVIDER: Lucille Passy, MD  HPI: Patient is a 70 year old female now at 2 years having completed whole breast radiation to her left breast for ER/PR positive ductal carcinoma in situ. Seen today in routine follow-up she is doing well. She specifically denies breast tenderness cough or bone pain.. Mammograms back in June which I have reviewed were BI-RADS 2 benign. She is currently on tamoxifen tolerating it well without side effect.  COMPLICATIONS OF TREATMENT: none  FOLLOW UP COMPLIANCE: keeps appointments   PHYSICAL EXAM:  BP 129/71 (BP Location: Left Arm, Patient Position: Sitting, Cuff Size: Normal)   Pulse 63   Temp (!) 97 F (36.1 C) (Tympanic)   Resp 16   Wt 157 lb (71.2 kg)   BMI 24.59 kg/m  Left breast is slightly contracted towards the incision. No dominant mass or nodularity is noted in either breast in 2 positions examined. No axillary or supraclavicular adenopathy is appreciated. Well-developed well-nourished patient in NAD. HEENT reveals PERLA, EOMI, discs not visualized.  Oral cavity is clear. No oral mucosal lesions are identified. Neck is clear without evidence of cervical or supraclavicular adenopathy. Lungs are clear to A&P. Cardiac examination is essentially unremarkable with regular rate and rhythm without murmur rub or thrill. Abdomen is benign with no organomegaly or masses noted. Motor sensory and DTR levels are equal and symmetric in the upper and lower extremities. Cranial nerves II through XII are grossly intact. Proprioception is intact. No peripheral adenopathy or edema is identified. No motor or sensory levels are noted. Crude visual fields are within normal  range.  RADIOLOGY RESULTS: Mammograms reviewed compatible with above-stated findings  PLAN: Present time patient is now 2 years out with no evidence of disease. She continues to do well. She currently is on tamoxifen tolerating that well. I have asked to see her back in 1 year for follow-up. Patient knows to call with any concerns.  I would like to take this opportunity to thank you for allowing me to participate in the care of your patient.Noreene Filbert, MD

## 2021-01-26 ENCOUNTER — Ambulatory Visit (INDEPENDENT_AMBULATORY_CARE_PROVIDER_SITE_OTHER): Payer: PPO

## 2021-01-26 ENCOUNTER — Other Ambulatory Visit: Payer: Self-pay

## 2021-01-26 DIAGNOSIS — Z0000 Encounter for general adult medical examination without abnormal findings: Secondary | ICD-10-CM | POA: Diagnosis not present

## 2021-01-26 NOTE — Progress Notes (Signed)
Subjective:   Susan [name] is a 70 y.o. female who presents for Medicare Annual (Subsequent) preventive examination.  Review of Systems: N/A      I connected with the patient today by telephone and verified that I am speaking with the correct person using two identifiers. Location patient: home Location nurse: work Persons participating in the telephone visit: patient, nurse.   I discussed the limitations, risks, security and privacy concerns of performing an evaluation and management service by telephone and the availability of in person appointments. I also discussed with the patient that there may be a patient responsible charge related to this service. The patient expressed understanding and verbally consented to this telephonic visit.        Cardiac Risk Factors include: advanced age (>22men, >20 women);hypertension     Objective:    Today's Vitals   01/26/21 1447  PainSc: 7    There is no height or weight on file to calculate BMI.  Advanced Directives 01/26/2021 01/14/2021 12/17/2020 06/18/2020 01/17/2020 01/10/2020 01/09/2020  Does Patient Have a Medical Advance Directive? Yes Yes Yes No Yes Yes No  Type of Paramedic of [place];Living will [place];Living will [place];Living will - Living will;Healthcare Power of Attorney Living will -  Does patient want to make changes to medical advance directive? - No - Patient declined Yes (ED - Information included in AVS) - No - Patient declined No - Patient declined -  Copy of [place] in Chart? No - copy requested No - copy requested - - No - copy requested - -  Would patient like information on creating a medical advance directive? - - - No - Patient declined No - Patient declined - No - Patient declined    Current Medications (verified) Outpatient Encounter Medications as of 01/26/2021  Medication Sig  . acetaminophen (TYLENOL) 500 MG tablet Take  1,000 mg by mouth every 6 (six) hours as needed.  . ALPRAZolam (XANAX) 0.5 MG tablet Take 1 tablet (0.5 mg total) by mouth at bedtime as needed. for sleep  . calcium citrate-vitamin D 500-500 MG-UNIT chewable tablet Chew 1 tablet by mouth 2 (two) times daily.  . cholecalciferol (VITAMIN D) 1000 units tablet Take 1,000 Units by mouth daily.  . famotidine (PEPCID) 20 MG tablet Take 20 mg by mouth as needed for heartburn or indigestion.  . fluticasone (FLONASE) 50 MCG/ACT nasal spray Place 2 sprays into both nostrils as needed.   . metoprolol succinate (TOPROL-XL) 25 MG 24 hr tablet Take 0.5 tablets (12.5 mg total) by mouth daily.  . metroNIDAZOLE (METROGEL) 1 % gel Apply 1 application topically as needed.  Marland Kitchen NAPROXEN PO Take 1 tablet by mouth every 12 (twelve) hours as needed (pain).  . psyllium (REGULOID) 0.52 g capsule Take 0.52 g by mouth 3 (three) times daily.   Marland Kitchen SIMETHICONE-80 PO Take 80 mg by mouth as needed (gas).   . tamoxifen (NOLVADEX) 20 MG tablet TAKE 1 TABLET BY MOUTH EVERY DAY   No facility-administered encounter medications on file as of 01/26/2021.    Allergies (verified) Patient has no known allergies.   History: Past Medical History:  Diagnosis Date  . Arthritis   . Basal cell carcinoma 05/26/2016   R chest parasternal  . Basal cell carcinoma 01/21/2015   L nose supratip   . 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 Cancer ([place])   . Complication of anesthesia   .  Dysrhythmia    PSVT on metoprolol  . Family history of breast cancer   . Gastric reflux 90's  . GERD (gastroesophageal reflux disease)   . Headache    H/O MIGRAINES  . Hiatal hernia 2003  . History of Coronary CT for Calcium Scoring    a. 11/2018 CT Cor Ca2+ = Zero.  Marland Kitchen Hypertension   . Mitral regurgitation    a. 03/2017 Echo: EF 55-60%, no nrwma, Mod MR; b. 03/2018 Echo: EF 60-65%, no rwma. Gr2 DD. Mild MV prolapse of ant leaflet w/ mild to mod mR. Nl RV fxn. Mild to mod TR.  Marland Kitchen Personal history of radiation  therapy 2019   LEFT lumpectomy  . PONV (postoperative nausea and vomiting)    N/V  . PSVT (paroxysmal supraventricular tachycardia) ([place])    a. 03/2017 Event Monitor: Avg HR 66, 10 episodes of SVT up to 42.8 secs, fastest 148 bpm. 8 beats WCT (179 bpm) - VT vs SVT w/ aberrancy. Rare PACs (2.6% total beats).  . 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   R prox lat bicep   Past Surgical History:  Procedure Laterality Date  . AUGMENTATION MAMMAPLASTY Bilateral 01/17/2020  . BREAST BIOPSY Left 06/13/2018   Affirm Bx- coil clip   atypical sclerosing and DCIS  . BREAST ENHANCEMENT SURGERY Bilateral 01/17/2020   Procedure: MAMMOPLASTY AUGMENTATION WITH PROSTHESIS (BREAST);  Surgeon: Cindra Presume, MD;  Location: [facility];  Service: Plastics;  Laterality: Bilateral;  2.5 hours for total case  . BREAST EXCISIONAL BIOPSY Right 2000   benign  . BREAST LUMPECTOMY Left 06/26/2018   atypical sclerosing lesion and DCIS bracketing  . BREAST LUMPECTOMY WITH NEEDLE LOCALIZATION Left 06/26/2018   Procedure: BREAST LUMPECTOMY WITH NEEDLE LOCALIZATION;  Surgeon: Jules Husbands, MD;  Location: [facility];  Service: General;  Laterality: Left;  . CHOLECYSTECTOMY    . OVARIAN CYST SURGERY    . SCAR REVISION Left 01/17/2020   Procedure: POSSIBLE SCAR REVISION TO LEFT BREAST;  Surgeon: Cindra Presume, MD;  Location: [facility];  Service: Plastics;  Laterality: Left;  . SKIN FULL THICKNESS GRAFT  01/17/2020   Procedure: SKIN GRAFT FULL THICKNESS;  Surgeon: Cindra Presume, MD;  Location: [facility];  Service: Plastics;;  . TONSILLECTOMY     age 67  . UPPER GI ENDOSCOPY  2003   Family History  Problem Relation Age of Onset  . Hyperlipidemia Mother   . Hypertension Mother   . COPD Mother   . Heart failure Mother        deceased 45  . Stroke Father 94  . Hypertension Father   . Hypertension Sister   . Hypertension Brother   . Healthy Brother   . Breast cancer  Cousin 74       daughter of paternal aunt; currently 46  . Breast cancer Cousin 31       daughter of paternal uncle; currently 66  . Breast cancer Cousin 72       daughter of maternal uncle; deceased 86  . Testicular cancer Other 13       sister's son; currently 16  . Melanoma Paternal Uncle   . Breast cancer Cousin 24       daughter of same maternal uncle; currently 73   Social History   Socioeconomic History  . Marital status: Married    Spouse name: Richard  . Number of children: 2  . Years of education: College  .  Highest education level: Not on file  Occupational History  . Not on file  Tobacco Use  . Smoking status: Never Smoker  . Smokeless tobacco: Never Used  Vaping Use  . Vaping Use: Never used  Substance and Sexual Activity  . Alcohol use: Yes    Alcohol/week: 2.0 standard drinks    Types: 2 Glasses of wine per week    Comment: daily WINE  . Drug use: No  . Sexual activity: Not Currently    Birth control/protection: Post-menopausal  Other Topics Concern  . 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 Difficulty of Paying Living Expenses: Not hard at all  Food Insecurity: No Food Insecurity  . Worried About Charity fundraiser in the Last Year: Never true  . Ran Out of Food in the Last Year: Never true  Transportation Needs: No Transportation Needs  . Lack of Transportation (Medical): No  . Lack of Transportation (Non-Medical): No  Physical Activity:  Sufficiently Active  . Days of Exercise per Week: 7 days  . Minutes of Exercise per Session: 60 min  Stress: No Stress Concern Present  . Feeling of Stress : Not at all  Social Connections: Not on file    Tobacco Counseling Counseling given: Not Answered   Clinical Intake:  Pre-visit preparation completed: Yes  Pain : 0-10 Pain Score: 7  Pain Type: Chronic pain Pain Location: Back Pain Orientation: Lower Pain Descriptors / Indicators: Aching Pain Onset: More than a month ago Pain Frequency: Intermittent     Nutritional Risks: None Diabetes: No  How often do you need to have someone help you when you read instructions, pamphlets, or other written materials from your doctor or pharmacy?: 1 - Never What is the last grade level you completed in school?: bachelors  Diabetic: No Nutrition Risk Assessment:  Has the patient had any N/V/D within the last 2 months?  No  Does the patient have any non-healing wounds?  No  Has the patient had any unintentional weight loss or weight gain?  No   Diabetes:  Is the patient diabetic?  No  If diabetic, was a CBG obtained today?  N/A Did the patient bring in their glucometer from home?  N/A How often do you monitor your CBG's? N/A.   Financial Strains and Diabetes Management:  Are you having any financial strains with the device, your supplies or your medication? N/A.  Does the patient want to be seen by Chronic Care Management for management of their diabetes?  N/A Would the patient like to be referred to a Nutritionist or for Diabetic Management?  N/A   Interpreter Needed?: No  Information entered by :: CJohnson, LPN   Activities of Daily Living In your present state of health, do you have any difficulty performing the following activities: 01/26/2021  Hearing? N  Vision? N  Difficulty concentrating or making decisions? N  Walking or climbing stairs? N  Dressing or bathing? N  Doing errands, shopping? N  Preparing Food  and eating ? N  Using the Toilet? N  In the past six months, have you accidently leaked urine? N  Do you have problems with loss of bowel control? N  Managing your Medications? N  Managing your Finances? N  Housekeeping or managing your Housekeeping? N  Some recent data might be hidden    Patient Care Team: Lesleigh Noe, MD as PCP - General (Family Medicine) Minna Merritts, MD as PCP - Cardiology (Cardiology) Tamsen Meek, MD as Referring Physician (Dermatology)  Indicate any recent Medical Services you may have received from other than [facility] providers in the past year (date may be approximate).     Assessment:   This is a routine wellness examination for Shavanna.  Hearing/Vision screen  Hearing Screening   125Hz  250Hz  500Hz  1000Hz  2000Hz  3000Hz  4000Hz  6000Hz  8000Hz   Right ear:           Left ear:           Vision Screening Comments: Patient gets annual eye exams   Dietary issues and exercise activities discussed: Current Exercise Habits: Home exercise routine, Type of exercise: walking, Time (Minutes): 60, Frequency (Times/Week): 7, Weekly Exercise (Minutes/Week): 420, Intensity: Moderate, Exercise limited by: None identified  Goals    . Increase physical activity     Starting 05/29/17, I will continue to exercise for 45-60 min daily.     . Maintain current healthy active lifestyle.    . Patient Stated     01/26/2021,  I will continue to walk daily for about 3 miles.       Depression Screen PHQ 2/9 Scores 01/26/2021 06/12/2019 05/30/2018 05/29/2017 01/25/2016  PHQ - 2 Score 0 0 0 0 0  PHQ- 9 Score 0 - - - -    Fall Risk Fall Risk  01/26/2021 07/21/2020 06/12/2019 07/11/2018 05/30/2018  Falls in the past year? 0 0 0 No No  Comment - Emmi Telephone Survey: data to providers prior to load - - -  Number falls in past yr: 0 - - - -  Injury with Fall? 0 - - - -  Risk for fall due to : Medication side effect - - - -  Follow up Falls evaluation completed;Falls prevention  discussed - - - -    FALL RISK PREVENTION PERTAINING TO THE HOME:  Any stairs in or around the home? Yes  If so, are there any without handrails? No  Home free of loose throw rugs in walkways, pet beds, electrical cords, etc? Yes  Adequate lighting in your home to reduce risk of falls? Yes   ASSISTIVE DEVICES UTILIZED TO PREVENT FALLS:  Life alert? No  Use of a cane, walker or w/c? No  Grab bars in the bathroom? No  Shower chair or bench in shower? No  Elevated toilet seat or a handicapped toilet? No   TIMED UP AND GO:  Was the test performed? N/A telephone/virtual visit .   Cognitive Function: MMSE - Mini Mental State Exam 01/26/2021 05/29/2017  Orientation to time 5 5  Orientation to Place 5 5  Registration 3 3  Attention/ Calculation 5 0  Recall 3 3  Language- name 2 objects - 0  Language- repeat 1 1  Language- follow 3 step command - 3  Language- read & follow direction - 0  Write  a sentence - 0  Copy design - 0  Total score - 20  Mini Cog  Mini-Cog screen was completed. Maximum score is 22. A value of 0 denotes this part of the MMSE was not completed or the patient failed this part of the Mini-Cog screening.       Immunizations Immunization History  Administered Date(s) Administered  . Fluad Quad(high Dose 65+) 09/05/2019  . Influenza, High Dose Seasonal PF 09/18/2018  . Influenza,inj,Quad PF,6+ Mos 10/02/2013, 09/23/2015, 09/16/2016, 10/03/2017  . Influenza-Unspecified 09/25/2020  . PFIZER(Purple Top)SARS-COV-2 Vaccination 02/02/2020, 02/27/2020, 10/13/2020  . Pneumococcal Conjugate-13 01/25/2016  . Pneumococcal Polysaccharide-23 05/29/2017  . Td 12/03/2008  . Zoster 09/29/2015  . Zoster Recombinat (Shingrix) 04/27/2020, 06/27/2020    TDAP status: Due, Education has been provided regarding the importance of this vaccine. Advised may receive this vaccine at local pharmacy or Health Dept. Aware to provide a copy of the vaccination record if obtained from  local pharmacy or Health Dept. Verbalized acceptance and understanding.  Flu Vaccine status: Up to date  Pneumococcal vaccine status: Up to date  Covid-19 vaccine status: Completed vaccines  Qualifies for Shingles Vaccine? Yes   Zostavax completed Yes   Shingrix Completed?: Yes  Screening Tests Health Maintenance  Topic Date Due  . Hepatitis C Screening  Never done  . TETANUS/TDAP  01/27/2024 (Originally 12/03/2018)  . COVID-19 Vaccine (4 - Booster for Pfizer series) 04/13/2021  . MAMMOGRAM  06/16/2022  . COLONOSCOPY (Pts 45-94yrs Insurance coverage will need to be confirmed)  06/16/2027  . INFLUENZA VACCINE  Completed  . DEXA SCAN  Completed  . PNA vac Low Risk Adult  Completed  . COLON CANCER SCREENING ANNUAL FOBT  Discontinued    Health Maintenance  Health Maintenance Due  Topic Date Due  . Hepatitis C Screening  Never done    Colorectal cancer screening: Type of screening: Colonoscopy. Completed 06/15/2017. Repeat every 10 years  Mammogram status: Completed 06/16/2020. Repeat every year  Bone Density status: Completed 08/27/2019. Results reflect: Bone density results: OSTEOPENIA. Repeat every 08/2021 years.  Lung Cancer Screening: (Low Dose CT Chest recommended if Age 59-80 years, 30 pack-year currently smoking OR have quit w/in 15 years.) does not qualify.   Additional Screening:  Hepatitis C Screening: does qualify; Completed due  Vision Screening: Recommended annual ophthalmology exams for early detection of glaucoma and other disorders of the eye. Is the patient up to date with their annual eye exam?  Yes  Who is the provider or what is the name of the office in which the patient attends annual eye exams? [facility] If pt is not established with a provider, would they like to be referred to a provider to establish care? No .   Dental Screening: Recommended annual dental exams for proper oral hygiene  Community Resource Referral / Chronic Care  Management: CRR required this visit?  No   CCM required this visit?  No      Plan:     I have personally reviewed and noted the following in the patient's chart:   . Medical and social history . Use of alcohol, tobacco or illicit drugs  . Current medications and supplements . Functional ability and status . Nutritional status . Physical activity . Advanced directives . List of other physicians . Hospitalizations, surgeries, and ER visits in previous 12 months . Vitals . Screenings to include cognitive, depression, and falls . Referrals and appointments  In addition, I have reviewed and discussed with patient certain  preventive protocols, quality metrics, and best practice recommendations. A written personalized care plan for preventive services as well as general preventive health recommendations were provided to patient.   Due to this being a telephonic/virtual visit, the after visit summary with patients personalized plan was offered to patient via office or my-chart. Patient preferred to pick up at office at next visit or via mychart.   Andrez Grime, LPN   [phone]

## 2021-01-26 NOTE — Progress Notes (Signed)
PCP notes:  Health Maintenance: Tdap- insurance    Abnormal Screenings: none   Patient concerns: Some sciatica pain in low back    Nurse concerns: none   Next PCP appt.: none

## 2021-01-26 NOTE — Patient Instructions (Signed)
[name] , Thank you for taking time to come for your Medicare Wellness Visit. I appreciate your ongoing commitment to your health goals. Please review the following plan we discussed and let me know if I can assist you in the future.   Screening recommendations/referrals: Colonoscopy: Up to date, completed 06/15/2017, due 05/2027 Mammogram: Up to date, completed 06/16/2020, scheduled 06/17/2021 Bone Density: Up to date, completed 08/27/2019, due 08/2021 Recommended yearly ophthalmology/optometry visit for glaucoma screening and checkup Recommended yearly dental visit for hygiene and checkup  Vaccinations: Influenza vaccine: Up to date, completed 09/25/2020, due 07/2021 Pneumococcal vaccine: Completed series Tdap vaccine: decline-insurance Shingles vaccine: Completed series   Covid-19:Completed series  Advanced directives: Please bring a copy of your POA (Power of Attorney) and/or Living Will to your next appointment.   Conditions/risks identified: hypertension  Next appointment: Follow up in one year for your annual wellness visit    Preventive Care 71 Years and Older, Female Preventive care refers to lifestyle choices and visits with your health care provider that can promote health and wellness. What does preventive care include?  A yearly physical exam. This is also called an annual well check.  Dental exams once or twice a year.  Routine eye exams. Ask your health care provider how often you should have your eyes checked.  Personal lifestyle choices, including:  Daily care of your teeth and gums.  Regular physical activity.  Eating a healthy diet.  Avoiding tobacco and drug use.  Limiting alcohol use.  Practicing safe sex.  Taking low-dose aspirin every day.  Taking vitamin and mineral supplements as recommended by your health care provider. What happens during an annual well check? The services and screenings done by your health care provider during your annual  well check will depend on your age, overall health, lifestyle risk factors, and family history of disease. Counseling  Your health care provider may ask you questions about your:  Alcohol use.  Tobacco use.  Drug use.  Emotional well-being.  Home and relationship well-being.  Sexual activity.  Eating habits.  History of falls.  Memory and ability to understand (cognition).  Work and work Statistician.  Reproductive health. Screening  You may have the following tests or measurements:  Height, weight, and BMI.  Blood pressure.  Lipid and cholesterol levels. These may be checked every 5 years, or more frequently if you are over 97 years old.  Skin check.  Lung cancer screening. You may have this screening every year starting at age 45 if you have a 30-pack-year history of smoking and currently smoke or have quit within the past 15 years.  Fecal occult blood test (FOBT) of the stool. You may have this test every year starting at age 11.  Flexible sigmoidoscopy or colonoscopy. You may have a sigmoidoscopy every 5 years or a colonoscopy every 10 years starting at age 77.  Hepatitis C blood test.  Hepatitis B blood test.  Sexually transmitted disease (STD) testing.  Diabetes screening. This is done by checking your blood sugar (glucose) after you have not eaten for a while (fasting). You may have this done every 1-3 years.  Bone density scan. This is done to screen for osteoporosis. You may have this done starting at age 47.  Mammogram. This may be done every 1-2 years. Talk to your health care provider about how often you should have regular mammograms. Talk with your health care provider about your test results, treatment options, and if necessary, the need for more tests.  Vaccines  Your health care provider may recommend certain vaccines, such as:  Influenza vaccine. This is recommended every year.  Tetanus, diphtheria, and acellular pertussis (Tdap, Td) vaccine.  You may need a Td booster every 10 years.  Zoster vaccine. You may need this after age 76.  Pneumococcal 13-valent conjugate (PCV13) vaccine. One dose is recommended after age 13.  Pneumococcal polysaccharide (PPSV23) vaccine. One dose is recommended after age 77. Talk to your health care provider about which screenings and vaccines you need and how often you need them. This information is not intended to replace advice given to you by your health care provider. Make sure you discuss any questions you have with your health care provider. Document Released: 01/08/2016 Document Revised: 08/31/2016 Document Reviewed: 10/13/2015 Elsevier Interactive Patient Education  2017 [employer] Prevention in the Home Falls can cause injuries. They can happen to people of all ages. There are many things you can do to make your home safe and to help prevent falls. What can I do on the outside of my home?  Regularly fix the edges of walkways and driveways and fix any cracks.  Remove anything that might make you trip as you walk through a door, such as a raised step or threshold.  Trim any bushes or trees on the path to your home.  Use bright outdoor lighting.  Clear any walking paths of anything that might make someone trip, such as rocks or tools.  Regularly check to see if handrails are loose or broken. Make sure that both sides of any steps have handrails.  Any raised decks and porches should have guardrails on the edges.  Have any leaves, snow, or ice cleared regularly.  Use sand or salt on walking paths during winter.  Clean up any spills in your garage right away. This includes oil or grease spills. What can I do in the bathroom?  Use night lights.  Install grab bars by the toilet and in the tub and shower. Do not use towel bars as grab bars.  Use non-skid mats or decals in the tub or shower.  If you need to sit down in the shower, use a plastic, non-slip stool.  Keep the  floor dry. Clean up any water that spills on the floor as soon as it happens.  Remove soap buildup in the tub or shower regularly.  Attach bath mats securely with double-sided non-slip rug tape.  Do not have throw rugs and other things on the floor that can make you trip. What can I do in the bedroom?  Use night lights.  Make sure that you have a light by your bed that is easy to reach.  Do not use any sheets or blankets that are too big for your bed. They should not hang down onto the floor.  Have a firm chair that has side arms. You can use this for support while you get dressed.  Do not have throw rugs and other things on the floor that can make you trip. What can I do in the kitchen?  Clean up any spills right away.  Avoid walking on wet floors.  Keep items that you use a lot in easy-to-reach places.  If you need to reach something above you, use a strong step stool that has a grab bar.  Keep electrical cords out of the way.  Do not use floor polish or wax that makes floors slippery. If you must use wax, use non-skid floor wax.  Do not have throw rugs and other things on the floor that can make you trip. What can I do with my stairs?  Do not leave any items on the stairs.  Make sure that there are handrails on both sides of the stairs and use them. Fix handrails that are broken or loose. Make sure that handrails are as long as the stairways.  Check any carpeting to make sure that it is firmly attached to the stairs. Fix any carpet that is loose or worn.  Avoid having throw rugs at the top or bottom of the stairs. If you do have throw rugs, attach them to the floor with carpet tape.  Make sure that you have a light switch at the top of the stairs and the bottom of the stairs. If you do not have them, ask someone to add them for you. What else can I do to help prevent falls?  Wear shoes that:  Do not have high heels.  Have rubber bottoms.  Are comfortable and fit  you well.  Are closed at the toe. Do not wear sandals.  If you use a stepladder:  Make sure that it is fully opened. Do not climb a closed stepladder.  Make sure that both sides of the stepladder are locked into place.  Ask someone to hold it for you, if possible.  Clearly mark and make sure that you can see:  Any grab bars or handrails.  First and last steps.  Where the edge of each step is.  Use tools that help you move around (mobility aids) if they are needed. These include:  Canes.  Walkers.  Scooters.  Crutches.  Turn on the lights when you go into a dark area. Replace any light bulbs as soon as they burn out.  Set up your furniture so you have a clear path. Avoid moving your furniture around.  If any of your floors are uneven, fix them.  If there are any pets around you, be aware of where they are.  Review your medicines with your doctor. Some medicines can make you feel dizzy. This can increase your chance of falling. Ask your doctor what other things that you can do to help prevent falls. This information is not intended to replace advice given to you by your health care provider. Make sure you discuss any questions you have with your health care provider. Document Released: 10/08/2009 Document Revised: 05/19/2016 Document Reviewed: 01/16/2015 Elsevier Interactive Patient Education  2017 [employer].

## 2021-02-19 MED FILL — METOPROLOL SUCC ER 25 MG TAB: 90 days supply | Qty: 45 | Fill #1

## 2021-02-27 MED FILL — TAMOXIFEN 20 MG TABLET: 90 days supply | Qty: 90 | Fill #2

## 2021-03-19 ENCOUNTER — Encounter (INDEPENDENT_AMBULATORY_CARE_PROVIDER_SITE_OTHER): Payer: Self-pay

## 2021-03-25 ENCOUNTER — Ambulatory Visit: Payer: PPO | Admitting: Family Medicine

## 2021-03-29 ENCOUNTER — Other Ambulatory Visit: Payer: Self-pay

## 2021-03-29 ENCOUNTER — Ambulatory Visit (INDEPENDENT_AMBULATORY_CARE_PROVIDER_SITE_OTHER): Payer: PPO | Admitting: Family Medicine

## 2021-03-29 ENCOUNTER — Encounter: Payer: Self-pay | Admitting: Family Medicine

## 2021-03-29 VITALS — BP 140/88 | HR 76 | Temp 98.6°F | Ht 67.0 in | Wt 158.0 lb

## 2021-03-29 DIAGNOSIS — M5442 Lumbago with sciatica, left side: Secondary | ICD-10-CM

## 2021-03-29 DIAGNOSIS — G8929 Other chronic pain: Secondary | ICD-10-CM | POA: Insufficient documentation

## 2021-03-29 MED ORDER — PREDNISONE 20 MG PO TABS: ORAL_TABLET | ORAL | 0 refills | Status: AC

## 2021-03-29 MED FILL — PREDNISONE 20 MG TABLET: 10 days supply | Qty: 11 | Fill #0

## 2021-03-29 NOTE — Progress Notes (Signed)
Subjective:     Susan [name] is a 70 y.o. female presenting for Back Pain (X 3 months into back of left leg down to foot at times. Has tingling in left foot but no weakness. No changes in bowel or bladder hobbits. Has tried ice, naproxen and ibuprofen. Sitting helps but standing increases pain at times. Denies injury and pain is 5/10 and dull ache. )     Back Pain This is a chronic problem. The current episode started more than 1 month ago. The pain is present in the lumbar spine. The quality of the pain is described as aching. The pain radiates to the left thigh and left foot. The pain is at a severity of 5/10. The symptoms are aggravated by standing. Associated symptoms include leg pain and tingling (left foot). Pertinent negatives include no bladder incontinence, bowel incontinence, fever, numbness, perianal numbness or weakness. She has tried ice and NSAIDs for the symptoms. The treatment provided mild relief.   Has gotten as bad as 7/10  Review of Systems  Constitutional: Negative for fever.  Gastrointestinal: Negative for bowel incontinence.  Genitourinary: Negative for bladder incontinence.  Musculoskeletal: Positive for back pain.  Neurological: Positive for tingling (left foot). Negative for weakness and numbness.     Social History   Tobacco Use  Smoking Status Never Smoker  Smokeless Tobacco Never Used        Objective:    BP Readings from Last 3 Encounters:  03/29/21 140/88  01/14/21 129/71  12/17/20 (!) 144/58   Wt Readings from Last 3 Encounters:  03/29/21 158 lb (71.7 kg)  01/14/21 157 lb (71.2 kg)  12/17/20 156 lb 10.2 oz (71.1 kg)    BP 140/88   Pulse 76   Temp 98.6 F (37 C) (Temporal)   Ht 5\' 7"  (1.702 m)   Wt 158 lb (71.7 kg)   SpO2 98%   BMI 24.75 kg/m    Physical Exam Constitutional:      General: She is not in acute distress.    Appearance: She is well-developed. She is not diaphoretic.  HENT:     Right Ear: External ear normal.      Left Ear: External ear normal.  Eyes:     Conjunctiva/sclera: Conjunctivae normal.  Cardiovascular:     Rate and Rhythm: Normal rate.  Pulmonary:     Effort: Pulmonary effort is normal.  Musculoskeletal:     Cervical back: Neck supple.     Comments: Back: Inspection: normal appear Palpation: no spinous or paraspinous ttp ROM: normal, though with some pain with left lateral flexion Strength: normal Reflexes: normal Straight leg raise - positive on the left, neg right  Skin:    General: Skin is warm and dry.     Capillary Refill: Capillary refill takes less than 2 seconds.  Neurological:     Mental Status: She is alert. Mental status is at baseline.  Psychiatric:        Mood and Affect: Mood normal.        Behavior: Behavior normal.           Assessment & Plan:   Problem List Items Addressed This Visit      Nervous and Auditory   Chronic left-sided low back pain with left-sided sciatica - Primary    Discussed option of trial of prednisone vs PT vs both. She would like to try prednisone. If symptoms return she will call and plan for PT referral. Discussed imaging if no improvement after  PT      Relevant Medications   predniSONE (DELTASONE) 20 MG tablet       Return if symptoms worsen or fail to improve.  Lesleigh Noe, MD  This visit occurred during the SARS-CoV-2 public health emergency.  Safety protocols were in place, including screening questions prior to the visit, additional usage of staff PPE, and extensive cleaning of exam room while observing appropriate contact time as indicated for disinfecting solutions.

## 2021-03-29 NOTE — Patient Instructions (Signed)
Take the steroids - if not completely better would recommend physical therapy

## 2021-03-29 NOTE — Assessment & Plan Note (Signed)
Discussed option of trial of prednisone vs PT vs both. She would like to try prednisone. If symptoms return she will call and plan for PT referral. Discussed imaging if no improvement after PT

## 2021-04-14 ENCOUNTER — Other Ambulatory Visit: Payer: Self-pay | Admitting: *Deleted

## 2021-04-14 NOTE — Telephone Encounter (Signed)
Last office visit 03/29/2021 for back pain.  Last refilled 02/05/2020 for #30 with 1 refill by Dr. Deborra Medina.  No future appointments with PCP.

## 2021-04-15 MED ORDER — ALPRAZOLAM 0.5 MG PO TABS: 0.5000 mg | ORAL_TABLET | Freq: Every evening | ORAL | 1 refills | Status: DC | PRN

## 2021-04-15 MED FILL — ALPRAZOLAM 0.5 MG TABLET: 30 days supply | Qty: 30 | Fill #0

## 2021-05-18 MED FILL — METOPROLOL SUCC ER 25 MG TAB: 90 days supply | Qty: 45 | Fill #2

## 2021-05-26 MED FILL — TAMOXIFEN 20 MG TABLET: 90 days supply | Qty: 90 | Fill #3

## 2021-06-03 DIAGNOSIS — H2513 Age-related nuclear cataract, bilateral: Secondary | ICD-10-CM | POA: Diagnosis not present

## 2021-06-17 ENCOUNTER — Other Ambulatory Visit: Payer: Self-pay

## 2021-06-17 ENCOUNTER — Ambulatory Visit
Admission: RE | Admit: 2021-06-17 | Discharge: 2021-06-17 | Disposition: A | Discharge status: Home / Self Care | Payer: PPO | Source: Ambulatory Visit | Attending: Hematology and Oncology | Admitting: Hematology and Oncology

## 2021-06-17 DIAGNOSIS — R922 Inconclusive mammogram: Secondary | ICD-10-CM | POA: Diagnosis not present

## 2021-06-17 DIAGNOSIS — D0512 Intraductal carcinoma in situ of left breast: Secondary | ICD-10-CM | POA: Insufficient documentation

## 2021-06-18 ENCOUNTER — Other Ambulatory Visit: Payer: Self-pay | Admitting: *Deleted

## 2021-06-18 DIAGNOSIS — D0512 Intraductal carcinoma in situ of left breast: Secondary | ICD-10-CM

## 2021-06-21 ENCOUNTER — Other Ambulatory Visit: Payer: PPO

## 2021-06-21 ENCOUNTER — Inpatient Hospital Stay: Payer: PPO | Attending: Oncology

## 2021-06-21 ENCOUNTER — Ambulatory Visit: Payer: PPO | Admitting: Hematology and Oncology

## 2021-06-21 ENCOUNTER — Inpatient Hospital Stay: Payer: PPO | Admitting: Oncology

## 2021-06-21 VITALS — BP 138/74 | HR 53 | Temp 99.0°F | Wt 155.0 lb

## 2021-06-21 DIAGNOSIS — Z79899 Other long term (current) drug therapy: Secondary | ICD-10-CM | POA: Diagnosis not present

## 2021-06-21 DIAGNOSIS — M8580 Other specified disorders of bone density and structure, unspecified site: Secondary | ICD-10-CM | POA: Insufficient documentation

## 2021-06-21 DIAGNOSIS — N898 Other specified noninflammatory disorders of vagina: Secondary | ICD-10-CM | POA: Insufficient documentation

## 2021-06-21 DIAGNOSIS — M85852 Other specified disorders of bone density and structure, left thigh: Secondary | ICD-10-CM | POA: Diagnosis not present

## 2021-06-21 DIAGNOSIS — Z791 Long term (current) use of non-steroidal anti-inflammatories (NSAID): Secondary | ICD-10-CM | POA: Insufficient documentation

## 2021-06-21 DIAGNOSIS — Z923 Personal history of irradiation: Secondary | ICD-10-CM | POA: Diagnosis not present

## 2021-06-21 DIAGNOSIS — I340 Nonrheumatic mitral (valve) insufficiency: Secondary | ICD-10-CM | POA: Insufficient documentation

## 2021-06-21 DIAGNOSIS — Z79810 Long term (current) use of selective estrogen receptor modulators (SERMs): Secondary | ICD-10-CM

## 2021-06-21 DIAGNOSIS — D0511 Intraductal carcinoma in situ of right breast: Secondary | ICD-10-CM | POA: Diagnosis not present

## 2021-06-21 DIAGNOSIS — I10 Essential (primary) hypertension: Secondary | ICD-10-CM | POA: Diagnosis not present

## 2021-06-21 DIAGNOSIS — Z5181 Encounter for therapeutic drug level monitoring: Secondary | ICD-10-CM | POA: Diagnosis not present

## 2021-06-21 DIAGNOSIS — Z170 Estrogen receptor positive status [ER+]: Secondary | ICD-10-CM | POA: Diagnosis not present

## 2021-06-21 DIAGNOSIS — K219 Gastro-esophageal reflux disease without esophagitis: Secondary | ICD-10-CM | POA: Diagnosis not present

## 2021-06-21 DIAGNOSIS — Z85828 Personal history of other malignant neoplasm of skin: Secondary | ICD-10-CM | POA: Insufficient documentation

## 2021-06-21 DIAGNOSIS — D0512 Intraductal carcinoma in situ of left breast: Secondary | ICD-10-CM

## 2021-06-21 DIAGNOSIS — I471 Supraventricular tachycardia: Secondary | ICD-10-CM | POA: Diagnosis not present

## 2021-06-21 LAB — CBC WITH DIFFERENTIAL/PLATELET
Abs Immature Granulocytes: 0.02 10*3/uL (ref 0.00–0.07)
Basophils Absolute: 0.1 10*3/uL (ref 0.0–0.1)
Basophils Relative: 1 %
Eosinophils Absolute: 0.2 10*3/uL (ref 0.0–0.5)
Eosinophils Relative: 3 %
HCT: 41.7 % (ref 36.0–46.0)
Hemoglobin: 14.2 g/dL (ref 12.0–15.0)
Immature Granulocytes: 0 %
Lymphocytes Relative: 21 %
Lymphs Abs: 1.3 10*3/uL (ref 0.7–4.0)
MCH: 30.5 pg (ref 26.0–34.0)
MCHC: 34.1 g/dL (ref 30.0–36.0)
MCV: 89.5 fL (ref 80.0–100.0)
Monocytes Absolute: 0.6 10*3/uL (ref 0.1–1.0)
Monocytes Relative: 10 %
Neutro Abs: 4.1 10*3/uL (ref 1.7–7.7)
Neutrophils Relative %: 65 %
Platelets: 231 10*3/uL (ref 150–400)
RBC: 4.66 MIL/uL (ref 3.87–5.11)
RDW: 12.9 % (ref 11.5–15.5)
WBC: 6.3 10*3/uL (ref 4.0–10.5)
nRBC: 0 % (ref 0.0–0.2)

## 2021-06-21 LAB — COMPREHENSIVE METABOLIC PANEL
ALT: 20 U/L (ref 0–44)
AST: 22 U/L (ref 15–41)
Albumin: 3.9 g/dL (ref 3.5–5.0)
Alkaline Phosphatase: 39 U/L (ref 38–126)
Anion gap: 7 (ref 5–15)
BUN: 20 mg/dL (ref 8–23)
CO2: 28 mmol/L (ref 22–32)
Calcium: 9.2 mg/dL (ref 8.9–10.3)
Chloride: 101 mmol/L (ref 98–111)
Creatinine, Ser: 0.79 mg/dL (ref 0.44–1.00)
GFR, Estimated: >60 mL/min (ref >60)
Glucose, Bld: 95 mg/dL (ref 70–99)
Potassium: 4.5 mmol/L (ref 3.5–5.1)
Sodium: 136 mmol/L (ref 135–145)
Total Bilirubin: 1.1 mg/dL (ref 0.3–1.2)
Total Protein: 6.4 g/dL — ABNORMAL LOW (ref 6.5–8.1)

## 2021-06-21 NOTE — Progress Notes (Signed)
Patient here for oncology follow-up appointment, expresses concerns of stopping Tamoxifen due to vaginal discharge

## 2021-06-21 NOTE — Progress Notes (Signed)
Hematology/Oncology Consult note [facility]  Telephone:([phone] [phone]  Patient Care Team: Lesleigh Noe, MD as PCP - General (Family Medicine) Minna Merritts, MD as PCP - Cardiology (Cardiology) Tamsen Meek, MD as Referring Physician (Dermatology) Sindy Guadeloupe, MD as Consulting Physician (Oncology)   Name of the patient: Susan [name]  [id number]  10-Aug-1951   Date of visit: 06/21/21  Diagnosis-left breast DCIS ER positive  Chief complaint/ Reason for visit-routine follow-up of left breast DCIS on tamoxifen  Heme/Onc history: Patient is a 70 year old female diagnosed with 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  2 adjacent groups of heterogeneous calcifications spanning 1.1 x 2 x 2.4 cm.  She underwent lumpectomy in July 2019.  Pathology showed 2.8 cm grade 2-3 DCIS with sclerosing adenosis.  ER greater than 90% positive and PR negative.  She then underwent adjuvant radiation treatment and began tamoxifen in September 2019.  Patient has baseline osteopenia with a T score of -1.8 in the left femoral neck.  Interval history-patient is tolerating tamoxifen well.  She does report clear to brownish vaginal discharge which has been ongoing for the last 6 to 7 months.  She was seen by GYN once and states that she was given some antibiotic.  Reports that her discharge has not changed much and continues to persist.  No frank vaginal bleeding.  ECOG PS- 1 Pain scale- 0  Review of systems- Review of Systems  Constitutional:  Negative for chills, fever, malaise/fatigue and weight loss.  HENT:  Negative for congestion, ear discharge and nosebleeds.   Eyes:  Negative for blurred vision.  Respiratory:  Negative for cough, hemoptysis, sputum production, shortness of breath and wheezing.   Cardiovascular:  Negative for chest pain, palpitations, orthopnea and claudication.  Gastrointestinal:  Negative for abdominal pain, blood in  stool, constipation, diarrhea, heartburn, melena, nausea and vomiting.  Genitourinary:  Negative for dysuria, flank pain, frequency, hematuria and urgency.       Vaginal discharge  Musculoskeletal:  Negative for back pain, joint pain and myalgias.  Skin:  Negative for rash.  Neurological:  Negative for dizziness, tingling, focal weakness, seizures, weakness and headaches.  Endo/Heme/Allergies:  Does not bruise/bleed easily.  Psychiatric/Behavioral:  Negative for depression and suicidal ideas. The patient does not have insomnia.      No Known Allergies   Past Medical History:  Diagnosis Date   Arthritis    Basal cell carcinoma 05/26/2016   R chest parasternal   Basal cell carcinoma 01/21/2015   L nose supratip    Breast cancer (HCC)    Left DCIS   Cancer (HCC)    Complication of anesthesia    Dysrhythmia    PSVT on metoprolol   Family history of breast cancer    Gastric reflux 90's   GERD (gastroesophageal reflux disease)    Headache    H/O MIGRAINES   Hiatal hernia 2003   History of Coronary CT for Calcium Scoring    a. 11/2018 CT Cor Ca2+ = Zero.   Hypertension    Mitral regurgitation    a. 03/2017 Echo: EF 55-60%, no nrwma, Mod MR; b. 03/2018 Echo: EF 60-65%, no rwma. Gr2 DD. Mild MV prolapse of ant leaflet w/ mild to mod mR. Nl RV fxn. Mild to mod TR.   Personal history of radiation therapy 2019   LEFT lumpectomy   PONV (postoperative nausea and vomiting)    N/V   PSVT (paroxysmal supraventricular tachycardia) (HCC)  a. 03/2017 Event Monitor: Avg HR 66, 10 episodes of SVT up to 42.8 secs, fastest 148 bpm. 8 beats WCT (179 bpm) - VT vs SVT w/ aberrancy. Rare PACs (2.6% total beats).   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   R prox lat bicep     Past Surgical History:  Procedure Laterality Date   AUGMENTATION MAMMAPLASTY Bilateral 01/17/2020   BREAST BIOPSY Left 06/13/2018   Affirm Bx- coil clip   atypical sclerosing and DCIS   BREAST ENHANCEMENT SURGERY  Bilateral 01/17/2020   Procedure: MAMMOPLASTY AUGMENTATION WITH PROSTHESIS (BREAST);  Surgeon: Cindra Presume, MD;  Location: [place];  Service: Plastics;  Laterality: Bilateral;  2.5 hours for total case   BREAST EXCISIONAL BIOPSY Right 2000   benign   BREAST LUMPECTOMY Left 06/26/2018   atypical sclerosing lesion and DCIS bracketing   BREAST LUMPECTOMY WITH NEEDLE LOCALIZATION Left 06/26/2018   Procedure: BREAST LUMPECTOMY WITH NEEDLE LOCALIZATION;  Surgeon: Jules Husbands, MD;  Location: [facility];  Service: General;  Laterality: Left;   CHOLECYSTECTOMY     OVARIAN CYST SURGERY     SCAR REVISION Left 01/17/2020   Procedure: POSSIBLE SCAR REVISION TO LEFT BREAST;  Surgeon: Cindra Presume, MD;  Location: [place];  Service: Plastics;  Laterality: Left;   SKIN FULL THICKNESS GRAFT  01/17/2020   Procedure: SKIN GRAFT FULL THICKNESS;  Surgeon: Cindra Presume, MD;  Location: [place];  Service: Plastics;;   TONSILLECTOMY     age 26   UPPER GI ENDOSCOPY  2003    Social History   Socioeconomic History   Marital status: Married    Spouse name: Richard   Number of children: 2   Years of education: College   Highest education level: Not on file  Occupational History   Not on file  Tobacco Use   Smoking status: Never   Smokeless tobacco: Never  Vaping Use   Vaping Use: Never used  Substance and Sexual Activity   Alcohol use: Yes    Alcohol/week: 2.0 standard drinks    Types: 2 Glasses of wine per week    Comment: daily WINE   Drug use: No   Sexual activity: Not Currently    Birth control/protection: Post-menopausal  Other Topics Concern   Not on file  Social History Narrative   03/30/20   From: the area, [place]: with husband Delfino Lovett [phone])   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      Family: 2 step children - Joycelyn Schmid and [place] - 3 grandchildren, good relationships      Enjoys: garden, flowers      Exercise:  walking, walking the dog 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) - daily, average 6 mile/day   Diet: fairly good       Safety   Seat belts: Yes    Guns: Yes  and secure   Safe in relationships: Yes    Social Determinants of Radio broadcast assistant Strain: Low Risk    Difficulty of Paying Living Expenses: Not hard at all  Food Insecurity: No Food Insecurity   Worried About Charity fundraiser in the Last Year: Never true   Arboriculturist in the Last Year: Never true  Transportation Needs: No Transportation Needs   Lack of Transportation (Medical): No   Lack of Transportation (Non-Medical): No  Physical Activity: Sufficiently Active   Days of Exercise per Week: 7 days   Minutes of Exercise  per Session: 60 min  Stress: No Stress Concern Present   Feeling of Stress : Not at all  Social Connections: Not on file  Intimate Partner Violence: Not At Risk   Fear of Current or Ex-Partner: No   Emotionally Abused: No   Physically Abused: No   Sexually Abused: No    Family History  Problem Relation Age of Onset   Hyperlipidemia Mother    Hypertension Mother    COPD Mother    Heart failure Mother        deceased 32   Stroke Father 40   Hypertension Father    Hypertension Sister    Hypertension Brother    Healthy Brother    Breast cancer Cousin 59       daughter of paternal aunt; currently 32   Breast cancer Cousin 6       daughter of paternal uncle; currently 34   Breast cancer Cousin 60       daughter of maternal uncle; deceased 76   Testicular cancer Other 84       sister's son; currently 21   Melanoma Paternal Uncle    Breast cancer Cousin 62       daughter of same maternal uncle; currently 63     Current Outpatient Medications:    acetaminophen (TYLENOL) 500 MG tablet, Take 1,000 mg by mouth every 6 (six) hours as needed., Disp: , Rfl:    ALPRAZolam (XANAX) 0.5 MG tablet, Take 1 tablet (0.5 mg total) by mouth at bedtime as needed. for sleep, Disp: 30 tablet, Rfl: 1   calcium  citrate-vitamin D 500-500 MG-UNIT chewable tablet, Chew 1 tablet by mouth 2 (two) times daily., Disp: , Rfl:    cholecalciferol (VITAMIN D) 1000 units tablet, Take 1,000 Units by mouth daily., Disp: , Rfl:    famotidine (PEPCID) 20 MG tablet, Take 20 mg by mouth as needed for heartburn or indigestion., Disp: , Rfl:    fluticasone (FLONASE) 50 MCG/ACT nasal spray, Place 2 sprays into both nostrils as needed. , Disp: , Rfl:    metoprolol succinate (TOPROL-XL) 25 MG 24 hr tablet, Take 0.5 tablets (12.5 mg total) by mouth daily., Disp: 45 tablet, Rfl: 3   Multiple Vitamin (MULTIVITAMIN) tablet, Take 1 tablet by mouth daily., Disp: , Rfl:    NAPROXEN PO, Take 1 tablet by mouth every 12 (twelve) hours as needed (pain)., Disp: , Rfl:    psyllium (REGULOID) 0.52 g capsule, Take 0.52 g by mouth 3 (three) times daily. , Disp: , Rfl:    SIMETHICONE-80 PO, Take 80 mg by mouth as needed (gas). , Disp: , Rfl:    tamoxifen (NOLVADEX) 20 MG tablet, TAKE 1 TABLET BY MOUTH EVERY DAY, Disp: 90 tablet, Rfl: 3   metroNIDAZOLE (METROGEL) 1 % gel, Apply 1 application topically as needed. (Patient not taking: No sig reported), Disp: , Rfl:   Physical exam:  Vitals:   06/21/21 1117  BP: 138/74  Pulse: (!) 53  Temp: 99 F (37.2 C)  TempSrc: Tympanic  SpO2: 99%  Weight: 155 lb (70.3 kg)   Physical Exam Cardiovascular:     Rate and Rhythm: Normal rate and regular rhythm.     Heart sounds: Normal heart sounds.  Pulmonary:     Effort: Pulmonary effort is normal.     Breath sounds: Normal breath sounds.  Abdominal:     General: Bowel sounds are normal.     Palpations: Abdomen is soft.  Skin:    General:  Skin is warm and dry.  Neurological:     Mental Status: She is alert and oriented to person, place, and time.    Breast exam was performed in seated and lying down position. Patient is status post left lumpectomy with a well-healed surgical scar. No evidence of any palpable masses. No evidence of axillary  adenopathy. No evidence of any palpable masses or lumps in the right breast. No evidence of right axillary adenopathy.  Bilateral breast implants in place  CMP Latest Ref Rng & Units 06/21/2021  Glucose 70 - 99 mg/dL 95  BUN 8 - 23 mg/dL 20  Creatinine 0.44 - 1.00 mg/dL 0.79  Sodium 135 - 145 mmol/L 136  Potassium 3.5 - 5.1 mmol/L 4.5  Chloride 98 - 111 mmol/L 101  CO2 22 - 32 mmol/L 28  Calcium 8.9 - 10.3 mg/dL 9.2  Total Protein 6.5 - 8.1 g/dL 6.4(L)  Total Bilirubin 0.3 - 1.2 mg/dL 1.1  Alkaline Phos 38 - 126 U/L 39  AST 15 - 41 U/L 22  ALT 0 - 44 U/L 20   CBC Latest Ref Rng & Units 06/21/2021  WBC 4.0 - 10.5 K/uL 6.3  Hemoglobin 12.0 - 15.0 g/dL 14.2  Hematocrit 36.0 - 46.0 % 41.7  Platelets 150 - 400 K/uL 231    No images are attached to the encounter.  MM DIAG BREAST W/IMPLANT TOMO BILATERAL  Result Date: 06/17/2021 CLINICAL DATA:  LEFT lumpectomy 2019 EXAM: DIGITAL DIAGNOSTIC BILATERAL MAMMOGRAM WITH IMPLANTS, CAD AND TOMOSYNTHESIS TECHNIQUE: Bilateral digital diagnostic mammography and breast tomosynthesis was performed. The images were evaluated with computer-aided detection. Standard and/or implant displaced views were performed. COMPARISON:  Previous exam(s). ACR Breast Density Category c: The breast tissue is heterogeneously dense, which may obscure small masses. FINDINGS: There is density and architectural distortion within the LEFT breast, consistent with postsurgical changes. These are stable in comparison to prior. The patient has retropectoral silicone implants. No suspicious mass, distortion, or microcalcifications are identified to suggest presence of malignancy. IMPRESSION: No mammographic evidence of malignancy. RECOMMENDATION: Per protocol, as the patient is now 2 or more years status post lumpectomy, she may return to annual screening mammography in 1 year. However, given the history of breast cancer, the patient remains eligible for annual diagnostic mammography if  preferred. I have discussed the findings and recommendations with the patient. If applicable, a reminder letter will be sent to the patient regarding the next appointment. BI-RADS CATEGORY  2: Benign. Electronically Signed   By: Valentino Saxon MD   On: 06/17/2021 12:04     Assessment and plan- Patient is a 70 y.o. female with left breast DCIS ER positive on tamoxifen and this is a routine follow-up  Recent mammogram from June 2022 was unremarkable.  Clinically patient is doing well with no concerning signs and symptoms of recurrence based on today's exam.  She will continue tamoxifen at this time.  Evaluate encouraged the patient to speak to her GYN about her vaginal discharge.  She said she will call them.  I will see her back in 6 months.  She will need a bone density scan prior   Visit Diagnosis 1. Osteopenia of neck of left femur   2. Ductal carcinoma in situ (DCIS) of left breast   3. Encounter for monitoring tamoxifen therapy      Dr. Randa Evens, MD, MPH [facility] [phone] 06/21/2021 4:28 PM

## 2021-07-21 ENCOUNTER — Other Ambulatory Visit: Payer: Self-pay

## 2021-07-21 ENCOUNTER — Encounter: Payer: Self-pay | Admitting: Family Medicine

## 2021-07-21 ENCOUNTER — Ambulatory Visit (INDEPENDENT_AMBULATORY_CARE_PROVIDER_SITE_OTHER): Payer: PPO | Admitting: Family Medicine

## 2021-07-21 ENCOUNTER — Other Ambulatory Visit (HOSPITAL_COMMUNITY)
Admission: RE | Admit: 2021-07-21 | Discharge: 2021-07-21 | Disposition: A | Discharge status: Home / Self Care | Payer: PPO | Source: Ambulatory Visit | Attending: Family Medicine | Admitting: Family Medicine

## 2021-07-21 VITALS — BP 134/78 | HR 67 | Wt 155.0 lb

## 2021-07-21 DIAGNOSIS — N898 Other specified noninflammatory disorders of vagina: Secondary | ICD-10-CM

## 2021-07-21 MED ORDER — DOXYCYCLINE HYCLATE 100 MG PO CAPS: 100.0000 mg | ORAL_CAPSULE | Freq: Two times a day (BID) | ORAL | 0 refills | Status: DC

## 2021-07-21 MED FILL — DOXYCYCLINE HYCLATE 100 MG CAP: 10 days supply | Qty: 20 | Fill #0

## 2021-07-21 NOTE — Progress Notes (Signed)
   Subjective:    Patient ID: Susan [name] is a 70 y.o. female presenting with Vaginal Discharge  on 07/21/2021  HPI: Patient reports significant vaginal discharge x 18 months. She has noted it for some time. She has had several wet preps and all are negative. She has been treated presumptively for BV and yeast. Notes no significant itching but some minor irritation. She has been on Tamoxifen for DCIS x 3 years. Denies vaginal bleeding. Reports discharge appears like "snot from a child's nose". Her oncologist is concerned about possible endometrial cancer.  Review of Systems  Constitutional:  Negative for chills and fever.  Respiratory:  Negative for shortness of breath.   Cardiovascular:  Negative for chest pain.  Gastrointestinal:  Negative for abdominal pain, nausea and vomiting.  Genitourinary:  Positive for vaginal discharge. Negative for dysuria.  Skin:  Negative for rash.     Objective:    BP 134/78   Pulse 67   Wt 155 lb (70.3 kg)   BMI 24.28 kg/m  Physical Exam Constitutional:      General: She is not in acute distress.    Appearance: She is well-developed.  HENT:     Head: Normocephalic and atraumatic.  Eyes:     General: No scleral icterus. Cardiovascular:     Rate and Rhythm: Normal rate.  Pulmonary:     Effort: Pulmonary effort is normal.  Abdominal:     Palpations: Abdomen is soft.  Genitourinary:    Comments: Small virginal introitus, erythema noted c/w atrophy, adherent yellow/green discharge noted. Small swab passed for wet prep. Musculoskeletal:     Cervical back: Neck supple.  Skin:    General: Skin is warm and dry.  Neurological:     Mental Status: She is alert and oriented to person, place, and time.        Assessment & Plan:   Problem List Items Addressed This Visit       Unprioritized   Vaginal discharge - Primary    Given presumptive treatments for yeast and BV have failed and negative cultures, will attempt treatment for possible  endometritis. Concern for endometrial CA noted due to Tamoxifen use--will check pelvic u/s. If needs biopsy, would pre-medicate with Valium, Cytotec and may still need in OR sampling if cannot tolerate exam.       Relevant Medications   doxycycline (VIBRAMYCIN) 100 MG capsule   Other Relevant Orders   US PELVIC COMPLETE WITH TRANSVAGINAL   Cervicovaginal ancillary only( [facility])    Return in about 4 weeks (around 08/18/2021) for possible endometrial biopsy.  Donnamae Jude 07/21/2021 6:25 PM

## 2021-07-21 NOTE — Assessment & Plan Note (Signed)
Given presumptive treatments for yeast and BV have failed and negative cultures, will attempt treatment for possible endometritis. Concern for endometrial CA noted due to Tamoxifen use--will check pelvic u/s. If needs biopsy, would pre-medicate with Valium, Cytotec and may still need in OR sampling if cannot tolerate exam.

## 2021-07-21 NOTE — Progress Notes (Signed)
Vaginal discharge for over a year, a yellow/brownish color and no odor.

## 2021-07-23 LAB — CERVICOVAGINAL ANCILLARY ONLY
Bacterial Vaginitis (gardnerella): NEGATIVE
Candida Glabrata: NEGATIVE
Candida Vaginitis: NEGATIVE
Chlamydia: NEGATIVE
Comment: NEGATIVE
Comment: NEGATIVE
Comment: NEGATIVE
Comment: NEGATIVE
Comment: NEGATIVE
Comment: NORMAL
Neisseria Gonorrhea: NEGATIVE
Trichomonas: NEGATIVE

## 2021-08-10 ENCOUNTER — Ambulatory Visit
Admission: RE | Admit: 2021-08-10 | Discharge: 2021-08-10 | Disposition: A | Discharge status: Home / Self Care | Payer: PPO | Source: Ambulatory Visit | Attending: Family Medicine | Admitting: Family Medicine

## 2021-08-10 ENCOUNTER — Other Ambulatory Visit: Payer: Self-pay

## 2021-08-10 DIAGNOSIS — C50919 Malignant neoplasm of unspecified site of unspecified female breast: Secondary | ICD-10-CM | POA: Diagnosis not present

## 2021-08-10 DIAGNOSIS — D251 Intramural leiomyoma of uterus: Secondary | ICD-10-CM | POA: Diagnosis not present

## 2021-08-10 DIAGNOSIS — N83202 Unspecified ovarian cyst, left side: Secondary | ICD-10-CM | POA: Diagnosis not present

## 2021-08-10 DIAGNOSIS — Z90721 Acquired absence of ovaries, unilateral: Secondary | ICD-10-CM | POA: Diagnosis not present

## 2021-08-10 DIAGNOSIS — N898 Other specified noninflammatory disorders of vagina: Secondary | ICD-10-CM | POA: Diagnosis not present

## 2021-08-12 ENCOUNTER — Other Ambulatory Visit: Payer: Self-pay | Admitting: Family Medicine

## 2021-08-12 MED ORDER — MISOPROSTOL 200 MCG PO TABS: ORAL_TABLET | ORAL | 0 refills | Status: DC

## 2021-08-12 MED FILL — MISOPROSTOL 200 MCG TABLET: 2 days supply | Qty: 4 | Fill #0

## 2021-08-16 MED FILL — METOPROLOL SUCC ER 25 MG TAB: 90 days supply | Qty: 45 | Fill #3

## 2021-08-19 ENCOUNTER — Other Ambulatory Visit: Payer: Self-pay

## 2021-08-19 ENCOUNTER — Ambulatory Visit (INDEPENDENT_AMBULATORY_CARE_PROVIDER_SITE_OTHER): Payer: PPO | Admitting: Family Medicine

## 2021-08-19 ENCOUNTER — Other Ambulatory Visit: Payer: Self-pay | Admitting: Family Medicine

## 2021-08-19 VITALS — BP 138/81 | HR 69

## 2021-08-19 DIAGNOSIS — R9389 Abnormal findings on diagnostic imaging of other specified body structures: Secondary | ICD-10-CM

## 2021-08-19 DIAGNOSIS — N898 Other specified noninflammatory disorders of vagina: Secondary | ICD-10-CM

## 2021-08-20 ENCOUNTER — Encounter: Payer: Self-pay | Admitting: Family Medicine

## 2021-08-20 NOTE — Assessment & Plan Note (Signed)
Little tissue and just fluid with quite stenotic cervix. Await biopsy results.

## 2021-08-20 NOTE — Progress Notes (Signed)
   Subjective:    Patient ID: Susan [name] is a 70 y.o. female presenting with Biopsy  on 08/19/2021  HPI: Referred for foul smelling vaginal discharge. Treated presumptively with doxy, no improvement. Cultures negative. H/o breast Ca and on Tamoxifen. Her oncologist is concerned about possible endometrial CA. Has taken cytotec x 2, Valium and Ibuprofen prior to procedure today. Has had u/s which showed thickened endometrium with possible cystic changes.   Review of Systems  Constitutional:  Negative for chills and fever.  Respiratory:  Negative for shortness of breath.   Cardiovascular:  Negative for chest pain.  Gastrointestinal:  Negative for abdominal pain, nausea and vomiting.  Genitourinary:  Negative for dysuria.  Skin:  Negative for rash.     Objective:    BP 138/81   Pulse 69  Physical Exam Exam conducted with a chaperone present.  Constitutional:      General: She is not in acute distress.    Appearance: She is well-developed.  HENT:     Head: Normocephalic and atraumatic.  Eyes:     General: No scleral icterus. Cardiovascular:     Rate and Rhythm: Normal rate.  Pulmonary:     Effort: Pulmonary effort is normal.  Abdominal:     Palpations: Abdomen is soft.  Genitourinary:    Comments: BUS normal, vagina is pale, cervix is nulliparous without lesion  Musculoskeletal:     Cervical back: Neck supple.  Skin:    General: Skin is warm and dry.  Neurological:     Mental Status: She is alert and oriented to person, place, and time.   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   Thickened heterogeneous endometrial complex 11 mm thick containing cystic changes and question small amount of endometrial fluid at fundus; endometrial thickness is considered abnormal for an asymptomatic post-menopausal female. Endometrial sampling should be considered to exclude carcinoma.  Procedure: Patient given informed consent, signed copy in the chart, time  out was performed. Appropriate time out taken. . The patient was placed in the lithotomy position and the cervix brought into view with sterile speculum.  Portio of cervix cleansed x 2 with betadine swabs.  A tenaculum was placed in the anterior lip of the cervix.  A metal dilator was used to enter the uterine cavity. The uterus was sounded for depth of 7 cm. A pipelle was introduced to into the uterus, suction created,  and an endometrial sample was obtained. Fluid emerged. Not much tissue noted. All equipment was removed and accounted for.  The patient tolerated the procedure well.      Assessment & Plan:   Problem List Items Addressed This Visit       Unprioritized   Vaginal discharge    Little tissue and just fluid with quite stenotic cervix. Await biopsy results.      Relevant Orders   Surgical pathology( [place]/ [facility])   Other Visit Diagnoses     Abnormal pelvic ultrasound    -  Primary   Relevant Orders   Surgical pathology( [place]/ [facility])   Informed Consent Details: Physician/Practitioner Attestation; Transcribe to consent form and obtain patient signature (Completed)        Return in about 4 weeks (around 09/16/2021).  Susan [name] 08/20/2021 2:41 PM

## 2021-08-24 ENCOUNTER — Other Ambulatory Visit: Payer: Self-pay | Admitting: *Deleted

## 2021-08-24 DIAGNOSIS — D0512 Intraductal carcinoma in situ of left breast: Secondary | ICD-10-CM

## 2021-08-24 MED ORDER — TAMOXIFEN CITRATE 20 MG PO TABS: 20.0000 mg | ORAL_TABLET | Freq: Every day | ORAL | 3 refills | Status: DC

## 2021-08-24 MED FILL — TAMOXIFEN 20 MG TABLET: 90 days supply | Qty: 90 | Fill #0

## 2021-08-31 ENCOUNTER — Ambulatory Visit
Admission: RE | Admit: 2021-08-31 | Discharge: 2021-08-31 | Disposition: A | Discharge status: Home / Self Care | Payer: PPO | Source: Ambulatory Visit | Attending: Oncology | Admitting: Oncology

## 2021-08-31 ENCOUNTER — Other Ambulatory Visit: Payer: Self-pay

## 2021-08-31 DIAGNOSIS — Z780 Asymptomatic menopausal state: Secondary | ICD-10-CM | POA: Diagnosis not present

## 2021-08-31 DIAGNOSIS — M85852 Other specified disorders of bone density and structure, left thigh: Secondary | ICD-10-CM

## 2021-08-31 DIAGNOSIS — M8589 Other specified disorders of bone density and structure, multiple sites: Secondary | ICD-10-CM | POA: Diagnosis not present

## 2021-09-02 ENCOUNTER — Other Ambulatory Visit: Payer: Self-pay

## 2021-09-02 ENCOUNTER — Ambulatory Visit: Payer: PPO | Admitting: Dermatology

## 2021-09-02 ENCOUNTER — Encounter: Payer: Self-pay | Admitting: *Deleted

## 2021-09-02 DIAGNOSIS — L570 Actinic keratosis: Secondary | ICD-10-CM

## 2021-09-02 DIAGNOSIS — D692 Other nonthrombocytopenic purpura: Secondary | ICD-10-CM

## 2021-09-02 DIAGNOSIS — Z85828 Personal history of other malignant neoplasm of skin: Secondary | ICD-10-CM | POA: Diagnosis not present

## 2021-09-02 DIAGNOSIS — L821 Other seborrheic keratosis: Secondary | ICD-10-CM

## 2021-09-02 DIAGNOSIS — L719 Rosacea, unspecified: Secondary | ICD-10-CM

## 2021-09-02 DIAGNOSIS — Z1283 Encounter for screening for malignant neoplasm of skin: Secondary | ICD-10-CM

## 2021-09-02 DIAGNOSIS — L820 Inflamed seborrheic keratosis: Secondary | ICD-10-CM | POA: Diagnosis not present

## 2021-09-02 DIAGNOSIS — D1800 Hemangioma unspecified site: Secondary | ICD-10-CM

## 2021-09-02 DIAGNOSIS — L578 Other skin changes due to chronic exposure to nonionizing radiation: Secondary | ICD-10-CM | POA: Diagnosis not present

## 2021-09-02 DIAGNOSIS — L814 Other melanin hyperpigmentation: Secondary | ICD-10-CM

## 2021-09-02 DIAGNOSIS — D229 Melanocytic nevi, unspecified: Secondary | ICD-10-CM | POA: Diagnosis not present

## 2021-09-02 NOTE — Progress Notes (Signed)
Follow-Up Visit   Subjective  Susan [name] is a 70 y.o. female who presents for the following: Annual Exam (History of BCC - TBSE today).  He has a rash on her face. The patient presents for Total-Body Skin Exam (TBSE) for skin cancer screening and mole check.  The following portions of the chart were reviewed this encounter and updated as appropriate:   Tobacco  Allergies  Meds  Problems  Med Hx  Surg Hx  Fam Hx     Review of Systems:  No other skin or systemic complaints except as noted in HPI or Assessment and Plan.  Objective  Well appearing patient in no apparent distress; mood and affect are within normal limits.  A full examination was performed including scalp, head, eyes, ears, nose, lips, neck, chest, axillae, abdomen, back, buttocks, bilateral upper extremities, bilateral lower extremities, hands, feet, fingers, toes, fingernails, and toenails. All findings within normal limits unless otherwise noted below.  Nose x 2, left forearm x 1 (3) Erythematous thin papules/macules with gritty scale.   mid back x 5 (5) Erythematous keratotic or waxy stuck-on papule or plaque.   Head - Anterior (Face) Erythema  Assessment & Plan   Purpura - Chronic; persistent and recurrent.  Treatable, but not curable. - Violaceous macules and patches - Benign - Related to trauma, age, sun damage and/or use of blood thinners, chronic use of topical and/or oral steroids - Observe - Can use OTC arnica containing moisturizer such as Dermend Bruise Formula if desired - Call for worsening or other concerns  Lentigines - Scattered tan macules - Due to sun exposure - Benign-appering, observe - Recommend daily broad spectrum sunscreen SPF 30+ to sun-exposed areas, reapply every 2 hours as needed. - Call for any changes  Seborrheic Keratoses - Stuck-on, waxy, tan-brown papules and/or plaques  - Benign-appearing - Discussed benign etiology and prognosis. - Observe - Call for any  changes  Melanocytic Nevi - Tan-brown and/or pink-flesh-colored symmetric macules and papules - Benign appearing on exam today - Observation - Call clinic for new or changing moles - Recommend daily use of broad spectrum spf 30+ sunscreen to sun-exposed areas.   Hemangiomas - Red papules - Discussed benign nature - Observe - Call for any changes  Actinic Damage - Chronic condition, secondary to cumulative UV/sun exposure - diffuse scaly erythematous macules with underlying dyspigmentation - Recommend daily broad spectrum sunscreen SPF 30+ to sun-exposed areas, reapply every 2 hours as needed.  - Staying in the shade or wearing long sleeves, sun glasses (UVA+UVB protection) and wide brim hats (4-inch brim around the entire circumference of the hat) are also recommended for sun protection.  - Call for new or changing lesions.  Skin cancer screening performed today.  AK (actinic keratosis) (3) Nose x 2, left forearm x 1  Destruction of lesion - Nose x 2, left forearm x 1 Complexity: simple   Destruction method: cryotherapy   Informed consent: discussed and consent obtained   Timeout:  patient name, date of birth, surgical site, and procedure verified Lesion destroyed using liquid nitrogen: Yes   Region frozen until ice ball extended beyond lesion: Yes   Outcome: patient tolerated procedure well with no complications   Post-procedure details: wound care instructions given    Inflamed seborrheic keratosis mid back x 5  Destruction of lesion - mid back x 5 Complexity: simple   Destruction method: cryotherapy   Informed consent: discussed and consent obtained   Timeout:  patient name, date of  birth, surgical site, and procedure verified Lesion destroyed using liquid nitrogen: Yes   Region frozen until ice ball extended beyond lesion: Yes   Outcome: patient tolerated procedure well with no complications   Post-procedure details: wound care instructions given    Rosacea Head  - Anterior (Face)  Rosacea is a chronic progressive skin condition usually affecting the face of adults, causing redness and/or acne bumps. It is treatable but not curable. It sometimes affects the eyes (ocular rosacea) as well. It may respond to topical and/or systemic medication and can flare with stress, sun exposure, alcohol, exercise and some foods.  Daily application of broad spectrum spf 30+ sunscreen to face is recommended to reduce flares.  Will prescribe [facility] metronidazole/ivermectin/azelaic acid twice daily as needed to affected areas on the face. The patient was advised this is not covered by insurance since it is made by a compounding pharmacy. They will receive an email to check out and the medication will be mailed to their home.    Skin cancer screening  Return in about 1 year (around 09/02/2022) for TBSE.  I, Ashok Cordia, CMA, am acting as scribe for Sarina Ser, MD . Documentation: I have reviewed the above documentation for accuracy and completeness, and I agree with the above.  Sarina Ser, MD

## 2021-09-02 NOTE — Patient Instructions (Addendum)
Instructions for [facility] Medications  One or more of your medications was sent to the [facility] mail order compounding pharmacy. You will receive an email from them and can purchase the medicine through that link. It will then be mailed to your home at the address you confirmed. If for any reason you do not receive an email from them, please check your spam folder. If you still do not find the email, please let us know. [facility] phone number is [phone].     Cryotherapy Aftercare  Wash gently with soap and water everyday.   Apply Vaseline and Band-Aid daily until healed.     If you have any questions or concerns for your doctor, please call our main line at [phone] and press option 4 to reach your doctor's medical assistant. If no one answers, please leave a voicemail as directed and we will return your call as soon as possible. Messages left after 4 pm will be answered the following business day.   You may also send [place] a message via [place]. We typically respond to MyChart messages within 1-2 business days.  For prescription refills, please ask your pharmacy to contact our office. Our fax number is [phone].  If you have an urgent issue when the clinic is closed that cannot wait until the next business day, you can page your doctor at the number below.    Please note that while we do our best to be available for urgent issues outside of office hours, we are not available 24/7.   If you have an urgent issue and are unable to reach [place], you may choose to seek medical care at your doctor's office, retail clinic, urgent care center, or emergency room.  If you have a medical emergency, please immediately call 911 or go to the emergency department.  Pager Numbers  - Dr. Nehemiah Massed: [phone]  - Dr. Laurence Ferrari: [phone]  - Dr. Nicole Kindred: [phone]  In the event of inclement weather, please call our main line at [phone] for an update on the status of  any delays or closures.  Dermatology Medication Tips: Please keep the boxes that topical medications come in in order to help keep track of the instructions about where and how to use these. Pharmacies typically print the medication instructions only on the boxes and not directly on the medication tubes.   If your medication is too expensive, please contact our office at [phone] option 4 or send [place] a message through [place].   We are unable to tell what your co-pay for medications will be in advance as this is different depending on your insurance coverage. However, we may be able to find a substitute medication at lower cost or fill out paperwork to get insurance to cover a needed medication.   If a prior authorization is required to get your medication covered by your insurance company, please allow [place] 1-2 business days to complete this process.  Drug prices often vary depending on where the prescription is filled and some pharmacies may offer cheaper prices.  The website www.goodrx.com contains coupons for medications through different pharmacies. The prices here do not account for what the cost may be with help from insurance (it may be cheaper with your insurance), but the website can give you the price if you did not use any insurance.  - You can print the associated coupon and take it with your prescription to the pharmacy.  - You may also stop by our office during regular  business hours and pick up a GoodRx coupon card.  - If you need your prescription sent electronically to a different pharmacy, notify our office through [facility] or by phone at [phone] option 4.

## 2021-09-07 ENCOUNTER — Encounter: Payer: Self-pay | Admitting: Dermatology

## 2021-09-21 MED FILL — ALPRAZOLAM 0.5 MG TABLET: 30 days supply | Qty: 30 | Fill #0

## 2021-11-12 ENCOUNTER — Other Ambulatory Visit: Payer: Self-pay | Admitting: Cardiovascular Disease

## 2021-11-12 NOTE — Telephone Encounter (Signed)
Please contact pt for future appointment Pt due for 12 month f/u pt needing refills.

## 2021-11-15 MED FILL — METOPROLOL SUCC ER 25 MG TAB: 90 days supply | Qty: 45 | Fill #0

## 2021-12-06 ENCOUNTER — Encounter: Payer: Self-pay | Admitting: Cardiovascular Disease

## 2021-12-06 ENCOUNTER — Ambulatory Visit: Payer: PPO | Admitting: Cardiovascular Disease

## 2021-12-06 ENCOUNTER — Other Ambulatory Visit: Payer: Self-pay

## 2021-12-06 VITALS — BP 120/70 | HR 55 | Ht 67.0 in | Wt 156.5 lb

## 2021-12-06 DIAGNOSIS — I471 Supraventricular tachycardia, unspecified: Secondary | ICD-10-CM

## 2021-12-06 DIAGNOSIS — I4719 Other supraventricular tachycardia: Secondary | ICD-10-CM

## 2021-12-06 DIAGNOSIS — I10 Essential (primary) hypertension: Secondary | ICD-10-CM | POA: Diagnosis not present

## 2021-12-06 DIAGNOSIS — I340 Nonrheumatic mitral (valve) insufficiency: Secondary | ICD-10-CM | POA: Diagnosis not present

## 2021-12-06 MED ORDER — METOPROLOL SUCCINATE ER 25 MG PO TB24: 12.5000 mg | ORAL_TABLET | Freq: Every day | ORAL | 3 refills | Status: DC

## 2021-12-06 NOTE — Progress Notes (Signed)
cardiology Office Note  Date:  12/06/2021   ID:  Susan [name], DOB 1951-07-07, MRN [id number]  PCP:  Lesleigh Noe, MD   Chief Complaint  Patient presents with   12 month follow up     "Doing well." Medications reviewed by the patient verbally.     HPI:  Susan [name] is a 70 y.o. female  Mild to moderate mitral valve regurgitation Nonsmoker Nondiabetic coronary CT for calcium scoring, revealing a calcium score of 0. History of nonsustained VT 9 beats seen on event monitor 10 runs of atrial tachycardia/supraventricular tachycardia on event monitor Who presents for follow-up of her arrhythmia  Rare palpitations, Every now and then, comes and goes Does not feel symptoms are getting worse, has not required extra metoprolol  Active Weight stable, denies shortness of breath or chest pain on exertion No near syncope or syncope Rarely when she has rapid paroxysmal tachycardia will have brief episode of dizziness/orthostasis  Prior monitor reviewed,zio,   tachycardia lasting less than 1 minute no more than 1 episode per day  EKG personally reviewed by myself on todays visit Normal sinus rhythm rate 55 bpm no significant ST-T wave changes  Other past medical history reviewed Last seen in clinic November 2019 Breast CA XRT finished sept 6th 2019 Left breast lumpectomy defect, right breast asymmetry Underwent breast surgery January 2021  echocardiogram April 2019 Mild to moderate MR April 2019   Long-term monitor 04/25/2017: Overall rhythm was sinus.   One run of slightly wide complex tachycardia, 8 beats at average of 179 BPM.    10 episodes of supraventricular tachycardia. Fastest was 16 seconds at 148 BPM. The longest was 42.8 seconds at 138 BPM. One was with a documented symptom. No evidence of significant pauses or AV block, or atrial fibrillation.   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  PSH:    Past Surgical History:  Procedure Laterality Date   AUGMENTATION MAMMAPLASTY Bilateral 01/17/2020   BREAST BIOPSY Left 06/13/2018   Affirm Bx- coil clip   atypical sclerosing and DCIS   BREAST ENHANCEMENT SURGERY Bilateral 01/17/2020   Procedure: MAMMOPLASTY AUGMENTATION WITH PROSTHESIS (BREAST);  Surgeon: Cindra Presume, MD;  Location: [facility];  Service: Plastics;  Laterality: Bilateral;  2.5 hours for total case   BREAST EXCISIONAL BIOPSY Right 2000   benign   BREAST LUMPECTOMY Left 06/26/2018   atypical sclerosing lesion and DCIS bracketing   BREAST LUMPECTOMY WITH NEEDLE LOCALIZATION Left 06/26/2018   Procedure: BREAST LUMPECTOMY WITH NEEDLE LOCALIZATION;  Surgeon: Jules Husbands, MD;  Location: [facility];  Service: General;  Laterality: Left;   CHOLECYSTECTOMY     OVARIAN CYST SURGERY     SCAR REVISION Left 01/17/2020   Procedure: POSSIBLE SCAR REVISION TO LEFT BREAST;  Surgeon: Cindra Presume, MD;  Location: [facility];  Service: Plastics;  Laterality: Left;   SKIN FULL THICKNESS GRAFT  01/17/2020   Procedure: SKIN GRAFT FULL THICKNESS;  Surgeon: Cindra Presume, MD;  Location: [facility];  Service: Plastics;;   TONSILLECTOMY  age 21   UPPER GI ENDOSCOPY  2003    Current Outpatient Medications  Medication Sig Dispense Refill   acetaminophen (TYLENOL) 500 MG tablet Take 1,000 mg by mouth every 6 (six) hours as needed.     ALPRAZolam (XANAX) 0.5  MG tablet Take 1 tablet (0.5 mg total) by mouth at bedtime as needed. for sleep 30 tablet 1   calcium citrate-vitamin D 500-500 MG-UNIT chewable tablet Chew 1 tablet by mouth 2 (two) times daily.     cholecalciferol (VITAMIN D) 1000 units tablet Take 1,000 Units by mouth daily.     famotidine (PEPCID) 20 MG tablet Take 20 mg by mouth as needed for heartburn or indigestion.     fluticasone (FLONASE) 50 MCG/ACT nasal spray Place 2 sprays into both nostrils as needed.      metoprolol succinate (TOPROL-XL) 25 MG 24 hr tablet TAKE 1/2 TABLET BY MOUTH EVERY DAY 45 tablet 0   Multiple Vitamin (MULTIVITAMIN) tablet Take 1 tablet by mouth daily.     NAPROXEN PO Take 1 tablet by mouth every 12 (twelve) hours as needed (pain).     psyllium (REGULOID) 0.52 g capsule Take 0.52 g by mouth 3 (three) times daily.      SIMETHICONE-80 PO Take 80 mg by mouth as needed (gas).      tamoxifen (NOLVADEX) 20 MG tablet Take 1 tablet (20 mg total) by mouth daily. 90 tablet 3   doxycycline (VIBRAMYCIN) 100 MG capsule Take 1 capsule (100 mg total) by mouth 2 (two) times daily. (Patient not taking: Reported on 12/06/2021) 20 capsule 0   metroNIDAZOLE (METROGEL) 1 % gel Apply 1 application topically as needed. (Patient not taking: Reported on 07/21/2021)     misoprostol (CYTOTEC) 200 MCG tablet Take 2 tablet the night prior to procedure and 2 the morning of the procedure (Patient not taking: Reported on 12/06/2021) 4 tablet 0   No current facility-administered medications for this visit.    Allergies:   Patient has no known allergies.   Social History:  The patient  reports that she has never smoked. She has never used smokeless tobacco. She reports current alcohol use of about 2.0 standard drinks per week. She reports that she does not use drugs.   Family History:   family history includes Breast cancer (age of onset: 43) in her cousin; Breast cancer (age of onset: 89) in her cousin; Breast cancer (age of onset: 62) in her  cousin and cousin; COPD in her mother; Healthy in her brother; Heart failure in her mother; Hyperlipidemia in her mother; Hypertension in her brother, father, mother, and sister; Melanoma in her paternal uncle; Stroke (age of onset: 79) in her father; Testicular cancer (age of onset: 37) in an other family member.   Review of Systems: Review of Systems  Constitutional: Negative.   HENT: Negative.    Respiratory: Negative.    Cardiovascular: Negative.   Gastrointestinal: Negative.   Musculoskeletal: Negative.   Neurological: Negative.   Psychiatric/Behavioral: Negative.    All other systems reviewed and are negative.  PHYSICAL EXAM: VS:  BP 120/70 (BP Location: Left Arm, Patient Position: Sitting, Cuff Size: Normal)   Pulse (!) 55   Ht 5\' 7"  (1.702 m)   Wt 156 lb 8 oz (71 kg)   SpO2 98%   BMI 24.51 kg/m  , BMI Body mass index is 24.51 kg/m. Constitutional:  oriented to person, place, and time. No distress.  HENT:  Head: Grossly normal Eyes:  no discharge.  No scleral icterus.  Neck: No JVD, no carotid bruits  Cardiovascular: Regular rate and rhythm, no murmurs appreciated Pulmonary/Chest: Clear to auscultation bilaterally, no wheezes or rails Abdominal: Soft.  no distension.  no tenderness.  Musculoskeletal: Normal range of motion Neurological:  normal muscle tone. Coordination normal. No atrophy Skin: Skin warm and dry Psychiatric: normal affect, pleasant  Recent Labs: 06/21/2021: ALT 20; BUN 20; Creatinine, Ser 0.79; Hemoglobin 14.2; Platelets 231; Potassium 4.5; Sodium 136    Lipid Panel Lab Results  Component Value Date   CHOL 202 (H) 05/30/2018   HDL 66.00 05/30/2018   LDLCALC 122 (H) 05/30/2018   TRIG 72.0 05/30/2018      Wt Readings from Last 3 Encounters:  12/06/21 156 lb 8 oz (71 kg)  07/21/21 155 lb (70.3 kg)  06/21/21 155 lb (70.3 kg)     ASSESSMENT AND PLAN:  Essential hypertension Blood pressure is well controlled on today's visit. No changes made  to the medications.  Palpitations  Recommend that she continue metoprolol succinate 12.5 mg daily Could use propranolol 10 mg as needed for breakthrough tachycardia Alternatively could take extra metoprolol succinate 12.5 mg  Atrial tachycardia (HCC) - Plan: EKG 12-Lead Continue metoprolol as above, extra short acting or long-acting beta-blocker as needed  Non-rheumatic mitral regurgitation Previously with mild to moderate MR April 2019 No symptoms concerning for progression, no significant murmur appreciated  Run of nonsustained VT  previously seen on monitor Continue low-dose metoprolol Recommend she call us for any near-syncope or syncope episodes  Breast cancer with radiation Completed bilateral surgery for cosmetic reasons January 2021  Preventive care CT coronary calcium score performed several years ago with score of 0   Total encounter time more than 25 minutes  Greater than 50% was spent in counseling and coordination of care with the patient    No orders of the defined types were placed in this encounter.    Signed, Esmond Plants, M.D., Ph.D. 12/06/2021  [place], [place]

## 2021-12-06 NOTE — Patient Instructions (Addendum)
Medication Instructions:  No changes  If you need a refill on your cardiac medications before your next appointment, please call your pharmacy.   Lab work: No new labs needed  Testing/Procedures: No new testing needed  Follow-Up: At [facility], you and your health needs are our priority.  As part of our continuing mission to provide you with exceptional heart care, we have created designated Provider Care Teams.  These Care Teams include your primary Cardiologist (physician) and Advanced Practice Providers (APPs -  Physician Assistants and Nurse Practitioners) who all work together to provide you with the care you need, when you need it.  You will need a follow up appointment as needed  Providers on your designated Care Team:   Christopher Berge, NP Ryan Dunn, PA-C Cadence Furth, PA-C  COVID-19 Vaccine Information can be found at: [email or web address] For questions related to vaccine distribution or appointments, please email [email or web address] or call [phone].    

## 2021-12-21 ENCOUNTER — Other Ambulatory Visit: Payer: Self-pay

## 2021-12-21 ENCOUNTER — Encounter: Payer: Self-pay | Admitting: Oncology

## 2021-12-21 ENCOUNTER — Inpatient Hospital Stay: Payer: PPO | Attending: Oncology | Admitting: Oncology

## 2021-12-21 VITALS — BP 140/64 | HR 65 | Temp 98.8°F | Resp 20 | Wt 158.2 lb

## 2021-12-21 DIAGNOSIS — N898 Other specified noninflammatory disorders of vagina: Secondary | ICD-10-CM | POA: Diagnosis not present

## 2021-12-21 DIAGNOSIS — Z791 Long term (current) use of non-steroidal anti-inflammatories (NSAID): Secondary | ICD-10-CM | POA: Diagnosis not present

## 2021-12-21 DIAGNOSIS — I10 Essential (primary) hypertension: Secondary | ICD-10-CM | POA: Diagnosis not present

## 2021-12-21 DIAGNOSIS — I340 Nonrheumatic mitral (valve) insufficiency: Secondary | ICD-10-CM | POA: Diagnosis not present

## 2021-12-21 DIAGNOSIS — K449 Diaphragmatic hernia without obstruction or gangrene: Secondary | ICD-10-CM | POA: Insufficient documentation

## 2021-12-21 DIAGNOSIS — Z803 Family history of malignant neoplasm of breast: Secondary | ICD-10-CM | POA: Diagnosis not present

## 2021-12-21 DIAGNOSIS — Z85820 Personal history of malignant melanoma of skin: Secondary | ICD-10-CM | POA: Diagnosis not present

## 2021-12-21 DIAGNOSIS — M8580 Other specified disorders of bone density and structure, unspecified site: Secondary | ICD-10-CM | POA: Insufficient documentation

## 2021-12-21 DIAGNOSIS — K219 Gastro-esophageal reflux disease without esophagitis: Secondary | ICD-10-CM | POA: Insufficient documentation

## 2021-12-21 DIAGNOSIS — Z170 Estrogen receptor positive status [ER+]: Secondary | ICD-10-CM | POA: Diagnosis not present

## 2021-12-21 DIAGNOSIS — Z79810 Long term (current) use of selective estrogen receptor modulators (SERMs): Secondary | ICD-10-CM | POA: Diagnosis not present

## 2021-12-21 DIAGNOSIS — D0512 Intraductal carcinoma in situ of left breast: Secondary | ICD-10-CM | POA: Insufficient documentation

## 2021-12-21 DIAGNOSIS — Z923 Personal history of irradiation: Secondary | ICD-10-CM | POA: Diagnosis not present

## 2021-12-21 NOTE — Progress Notes (Signed)
Hematology/Oncology Consult note [facility]  [phone] [phone]  Patient Care Team: Lesleigh Noe, MD as PCP - General (Family Medicine) Minna Merritts, MD as PCP - Cardiology (Cardiology) Tamsen Meek, MD as Referring Physician (Dermatology) Sindy Guadeloupe, MD as Consulting Physician (Oncology)   Name of the patient: Susan [name]  [id number]  1951-09-16   Date of visit: 12/21/21  Diagnosis-left breast DCIS ER positive  Chief complaint/ Reason for visit-routine follow-up of left breast DCIS on tamoxifen  Heme/Onc history: Patient is a 70 year old female diagnosed with 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  2 adjacent groups of heterogeneous calcifications spanning 1.1 x 2 x 2.4 cm.  She underwent lumpectomy in July 2019.  Pathology showed 2.8 cm grade 2-3 DCIS with sclerosing adenosis.  ER greater than 90% positive and PR negative.  She then underwent adjuvant radiation treatment and began tamoxifen in September 2019.  Patient has baseline osteopenia with a T score of -1.8 in the left femoral neck.  Interval history-she was evaluated by OB/GYN on 07/21/2021 for vaginal discharge and treated for yeast and bacterial vaginosis and low endometritis with doxycycline without improvement of her symptoms.  She had biopsy on 08/19/2021 and reports it was negative for cancer.  Vaginal discharge has essentially resolved since biopsy.  Continues to tolerate tamoxifen well.   ECOG PS- 1 Pain scale- 0  Review of systems- Review of Systems  Constitutional: Negative.  Negative for chills, fever, malaise/fatigue and weight loss.  HENT:  Negative for congestion, ear pain and tinnitus.   Eyes: Negative.  Negative for blurred vision and double vision.  Respiratory: Negative.  Negative for cough, sputum production and shortness of breath.   Cardiovascular: Negative.  Negative for chest pain, palpitations and leg swelling.   Gastrointestinal: Negative.  Negative for abdominal pain, constipation, diarrhea, nausea and vomiting.  Genitourinary:  Negative for dysuria, frequency and urgency.  Musculoskeletal:  Negative for back pain and falls.  Skin: Negative.  Negative for rash.  Neurological: Negative.  Negative for weakness and headaches.  Endo/Heme/Allergies: Negative.  Does not bruise/bleed easily.  Psychiatric/Behavioral: Negative.  Negative for depression. The patient is not nervous/anxious and does not have insomnia.      No Known Allergies   Past Medical History:  Diagnosis Date   Arthritis    Basal cell carcinoma 05/26/2016   R chest parasternal   Basal cell carcinoma 01/21/2015   L nose supratip    Breast cancer (HCC)    Left DCIS   Cancer (HCC)    Complication of anesthesia    Dysrhythmia    PSVT on metoprolol   Family history of breast cancer    Gastric reflux 90's   GERD (gastroesophageal reflux disease)    Headache    H/O MIGRAINES   Hiatal hernia 2003   History of Coronary CT for Calcium Scoring    a. 11/2018 CT Cor Ca2+ = Zero.   Hypertension    Mitral regurgitation    a. 03/2017 Echo: EF 55-60%, no nrwma, Mod MR; b. 03/2018 Echo: EF 60-65%, no rwma. Gr2 DD. Mild MV prolapse of ant leaflet w/ mild to mod mR. Nl RV fxn. Mild to mod TR.   Personal history of radiation therapy 2019   LEFT lumpectomy   PONV (postoperative nausea and vomiting)    N/V   PSVT (paroxysmal supraventricular tachycardia) ([place])    a. 03/2017 Event Monitor: Avg HR 66, 10 episodes of SVT up  to 42.8 secs, fastest 148 bpm. 8 beats WCT (179 bpm) - VT vs SVT w/ aberrancy. Rare PACs (2.6% total beats).   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   R prox lat bicep     Past Surgical History:  Procedure Laterality Date   AUGMENTATION MAMMAPLASTY Bilateral 01/17/2020   BREAST BIOPSY Left 06/13/2018   Affirm Bx- coil clip   atypical sclerosing and DCIS   BREAST ENHANCEMENT SURGERY Bilateral 01/17/2020    Procedure: MAMMOPLASTY AUGMENTATION WITH PROSTHESIS (BREAST);  Surgeon: Cindra Presume, MD;  Location: [facility];  Service: Plastics;  Laterality: Bilateral;  2.5 hours for total case   BREAST EXCISIONAL BIOPSY Right 2000   benign   BREAST LUMPECTOMY Left 06/26/2018   atypical sclerosing lesion and DCIS bracketing   BREAST LUMPECTOMY WITH NEEDLE LOCALIZATION Left 06/26/2018   Procedure: BREAST LUMPECTOMY WITH NEEDLE LOCALIZATION;  Surgeon: Jules Husbands, MD;  Location: [facility];  Service: General;  Laterality: Left;   CHOLECYSTECTOMY     OVARIAN CYST SURGERY     SCAR REVISION Left 01/17/2020   Procedure: POSSIBLE SCAR REVISION TO LEFT BREAST;  Surgeon: Cindra Presume, MD;  Location: [place];  Service: Plastics;  Laterality: Left;   SKIN FULL THICKNESS GRAFT  01/17/2020   Procedure: SKIN GRAFT FULL THICKNESS;  Surgeon: Cindra Presume, MD;  Location: [place];  Service: Plastics;;   TONSILLECTOMY     age 60   UPPER GI ENDOSCOPY  2003    Social History   Socioeconomic History   Marital status: Married    Spouse name: Richard   Number of children: 2   Years of education: College   Highest education level: Not on file  Occupational History   Not on file  Tobacco Use   Smoking status: Never   Smokeless tobacco: Never  Vaping Use   Vaping Use: Never used  Substance and Sexual Activity   Alcohol use: Yes    Alcohol/week: 2.0 standard drinks    Types: 2 Glasses of wine per week    Comment: daily WINE   Drug use: No   Sexual activity: Not Currently    Birth control/protection: Post-menopausal  Other Topics Concern   Not on file  Social History Narrative   03/30/20   From: the area, [place]: with husband Delfino Lovett [phone])   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      Family: 2 step children - Joycelyn Schmid and [place] - 3 grandchildren, good relationships      Enjoys: garden, flowers      Exercise: walking, walking the dog  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) - daily, average 6 mile/day   Diet: fairly good       Safety   Seat belts: Yes    Guns: Yes  and secure   Safe in relationships: Yes    Social Determinants of Radio broadcast assistant Strain: Low Risk    Difficulty of Paying Living Expenses: Not hard at all  Food Insecurity: No Food Insecurity   Worried About Charity fundraiser in the Last Year: Never true   Arboriculturist in the Last Year: Never true  Transportation Needs: No Transportation Needs   Lack of Transportation (Medical): No   Lack of Transportation (Non-Medical): No  Physical Activity: Sufficiently Active   Days of Exercise per Week: 7 days   Minutes of Exercise per Session: 60 min  Stress: No Stress Concern Present  Feeling of Stress : Not at all  Social Connections: Not on file  Intimate Partner Violence: Not At Risk   Fear of Current or Ex-Partner: No   Emotionally Abused: No   Physically Abused: No   Sexually Abused: No    Family History  Problem Relation Age of Onset   Hyperlipidemia Mother    Hypertension Mother    COPD Mother    Heart failure Mother        deceased 55   Stroke Father 93   Hypertension Father    Hypertension Sister    Hypertension Brother    Healthy Brother    Breast cancer Cousin 71       daughter of paternal aunt; currently 44   Breast cancer Cousin 67       daughter of paternal uncle; currently 5   Breast cancer Cousin 77       daughter of maternal uncle; deceased 8   Testicular cancer Other 45       sister's son; currently 66   Melanoma Paternal Uncle    Breast cancer Cousin 56       daughter of same maternal uncle; currently 64     Current Outpatient Medications:    acetaminophen (TYLENOL) 500 MG tablet, Take 1,000 mg by mouth every 6 (six) hours as needed., Disp: , Rfl:    ALPRAZolam (XANAX) 0.5 MG tablet, Take 1 tablet (0.5 mg total) by mouth at bedtime as needed. for sleep, Disp: 30 tablet, Rfl: 1   calcium citrate-vitamin D 500-500 MG-UNIT  chewable tablet, Chew 1 tablet by mouth 2 (two) times daily., Disp: , Rfl:    cholecalciferol (VITAMIN D) 1000 units tablet, Take 1,000 Units by mouth daily., Disp: , Rfl:    famotidine (PEPCID) 20 MG tablet, Take 20 mg by mouth as needed for heartburn or indigestion., Disp: , Rfl:    fluticasone (FLONASE) 50 MCG/ACT nasal spray, Place 2 sprays into both nostrils as needed. , Disp: , Rfl:    metoprolol succinate (TOPROL-XL) 25 MG 24 hr tablet, Take 0.5 tablets (12.5 mg total) by mouth daily., Disp: 45 tablet, Rfl: 3   Multiple Vitamin (MULTIVITAMIN) tablet, Take 1 tablet by mouth daily., Disp: , Rfl:    NAPROXEN PO, Take 1 tablet by mouth every 12 (twelve) hours as needed (pain)., Disp: , Rfl:    psyllium (REGULOID) 0.52 g capsule, Take 0.52 g by mouth 3 (three) times daily. , Disp: , Rfl:    SIMETHICONE-80 PO, Take 80 mg by mouth as needed (gas). , Disp: , Rfl:    tamoxifen (NOLVADEX) 20 MG tablet, Take 1 tablet (20 mg total) by mouth daily., Disp: 90 tablet, Rfl: 3   doxycycline (VIBRAMYCIN) 100 MG capsule, Take 1 capsule (100 mg total) by mouth 2 (two) times daily. (Patient not taking: Reported on 12/06/2021), Disp: 20 capsule, Rfl: 0   metroNIDAZOLE (METROGEL) 1 % gel, Apply 1 application topically as needed. (Patient not taking: Reported on 07/21/2021), Disp: , Rfl:    misoprostol (CYTOTEC) 200 MCG tablet, Take 2 tablet the night prior to procedure and 2 the morning of the procedure (Patient not taking: Reported on 12/06/2021), Disp: 4 tablet, Rfl: 0  Physical exam:  Vitals:   12/21/21 1255  BP: 140/64  Pulse: 65  Resp: 20  Temp: 98.8 F (37.1 C)  SpO2: 100%  Weight: 158 lb 3.2 oz (71.8 kg)   Physical Exam Constitutional:      Appearance: Normal appearance.  HENT:  Head: Normocephalic and atraumatic.  Eyes:     Pupils: Pupils are equal, round, and reactive to light.  Cardiovascular:     Rate and Rhythm: Normal rate and regular rhythm.     Heart sounds: Normal heart sounds. No  murmur heard. Pulmonary:     Effort: Pulmonary effort is normal.     Breath sounds: Normal breath sounds. No wheezing.  Abdominal:     General: Bowel sounds are normal. There is no distension.     Palpations: Abdomen is soft.     Tenderness: There is no abdominal tenderness.  Musculoskeletal:        General: Normal range of motion.     Cervical back: Normal range of motion.  Skin:    General: Skin is warm and dry.     Findings: No rash.  Neurological:     Mental Status: She is alert and oriented to person, place, and time.  Psychiatric:        Judgment: Judgment normal.    No new lumps or bumps. Left breast without evidence of malignancy. CMP Latest Ref Rng & Units 06/21/2021  Glucose 70 - 99 mg/dL 95  BUN 8 - 23 mg/dL 20  Creatinine 0.44 - 1.00 mg/dL 0.79  Sodium 135 - 145 mmol/L 136  Potassium 3.5 - 5.1 mmol/L 4.5  Chloride 98 - 111 mmol/L 101  CO2 22 - 32 mmol/L 28  Calcium 8.9 - 10.3 mg/dL 9.2  Total Protein 6.5 - 8.1 g/dL 6.4(L)  Total Bilirubin 0.3 - 1.2 mg/dL 1.1  Alkaline Phos 38 - 126 U/L 39  AST 15 - 41 U/L 22  ALT 0 - 44 U/L 20   CBC Latest Ref Rng & Units 06/21/2021  WBC 4.0 - 10.5 K/uL 6.3  Hemoglobin 12.0 - 15.0 g/dL 14.2  Hematocrit 36.0 - 46.0 % 41.7  Platelets 150 - 400 K/uL 231    No images are attached to the encounter.  No results found.   Assessment and plan- Patient is a 70 y.o. female with left breast DCIS ER positive on tamoxifen and this is a routine follow-up  Mammogram from June 2022 was unremarkable.  Clinically patient is doing well with no concerning signs of recurrence based on today's exam.  She will be due for a repeat mammogram in June 2023.  Orders placed.  She is tolerating tamoxifen well.  Vaginal discharge-patient had pelvic ultrasound which recommended a biopsy.  Biopsy was negative for cancer.  Vaginal discharge has since resolved.  Osteopenia-T score from 08/31/2021 was -1.7 which is stable from bone density from 2020.Marland Kitchen   Continue calcium and vitamin D.  Disposition- Mammogram in June 2023. Return to clinic after mammogram to see Dr. Janese Banks to review results.  I spent 20 minutes dedicated to the care of this patient (face-to-face and non-face-to-face) on the date of the encounter to include what is described in the assessment and plan.  Visit Diagnosis 1. Ductal carcinoma in situ of left breast    Faythe Casa, NP 12/21/2021 3:41 PM

## 2022-01-14 ENCOUNTER — Ambulatory Visit: Payer: PPO | Admitting: Radiation Oncology

## 2022-01-15 IMAGING — US US PELVIS COMPLETE WITH TRANSVAGINAL
1 series · 13 of 25 positions shown · non-contrast
Comparison: None

CLINICAL DATA: Breast cancer, on tamoxifen, assessment of
endometrium, vaginal discharge for 1 year. Past history of
unilateral oophorectomy, laterality unknown by patient

EXAM:
TRANSABDOMINAL AND TRANSVAGINAL ULTRASOUND OF PELVIS
TECHNIQUE: Both transabdominal and transvaginal ultrasound examinations of the
pelvis were performed. Transabdominal technique was performed for
global imaging of the pelvis including uterus, ovaries, adnexal
regions, and pelvic cul-de-sac. It was necessary to proceed with
endovaginal exam following the transabdominal exam to visualize the
endometrium and ovaries.

[Series 1: us pelvic complete with transvaginal · 13 of 44 slices shown]
[im 1/44]
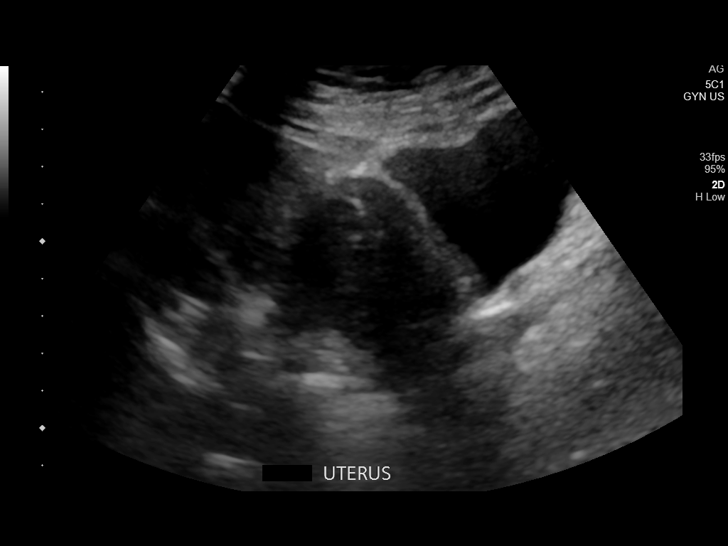
[im 4/44]
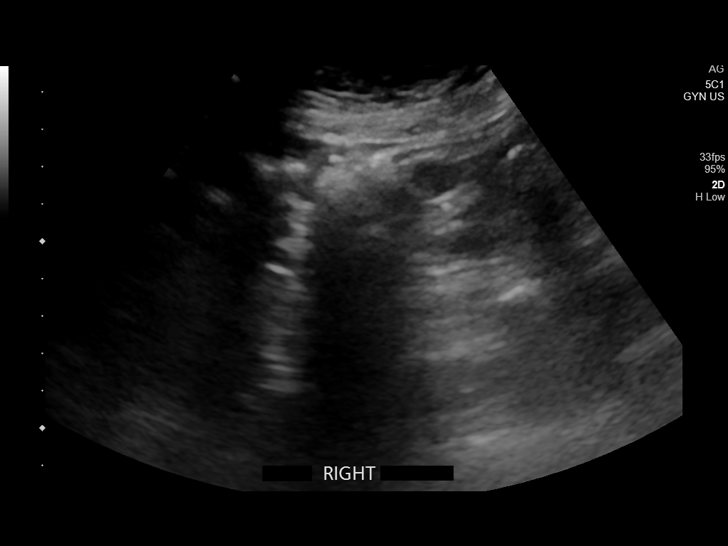
[im 8/44]
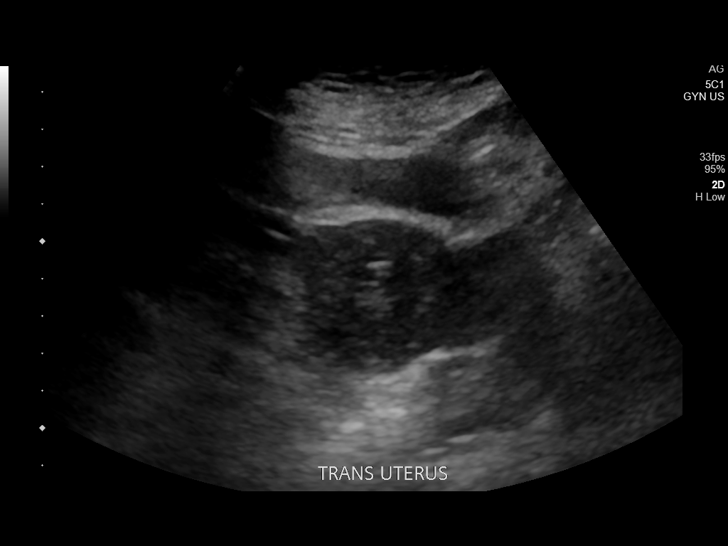
[im 11/44]
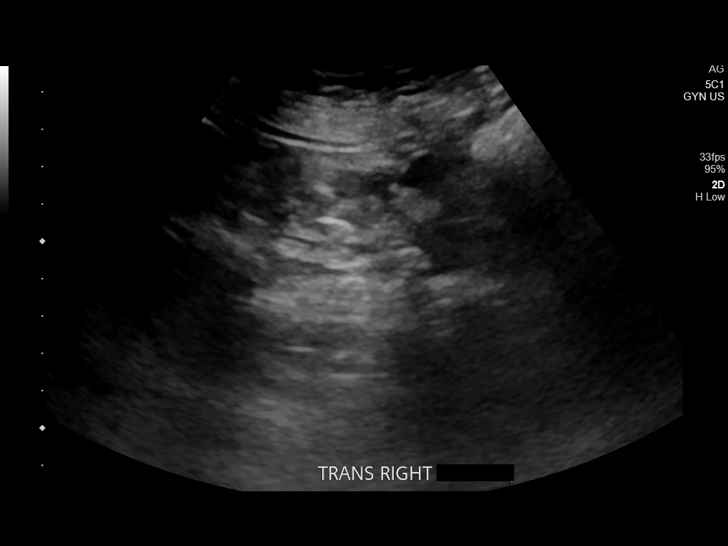
[im 15/44]
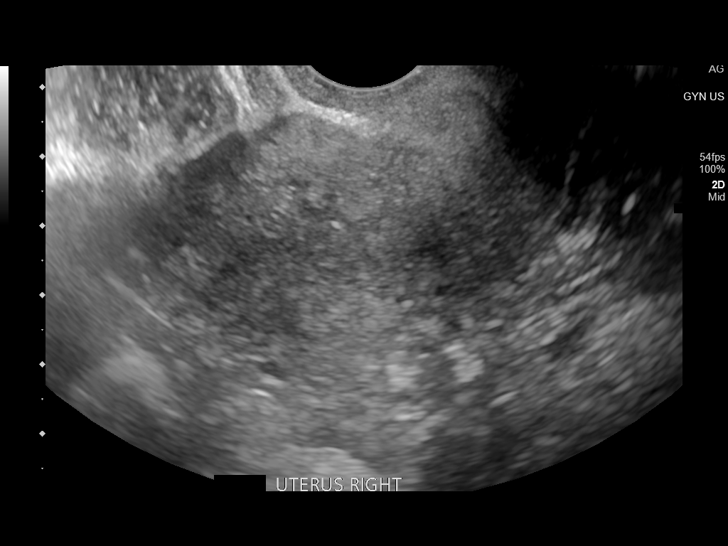
[im 18/44]
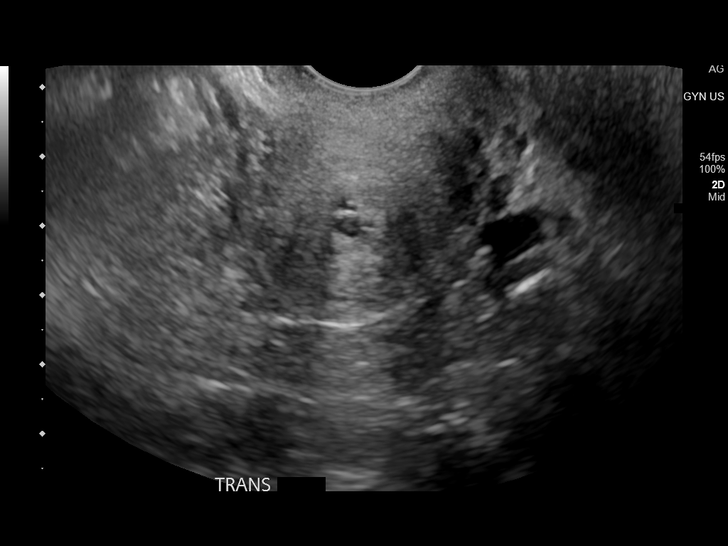
[im 22/44]
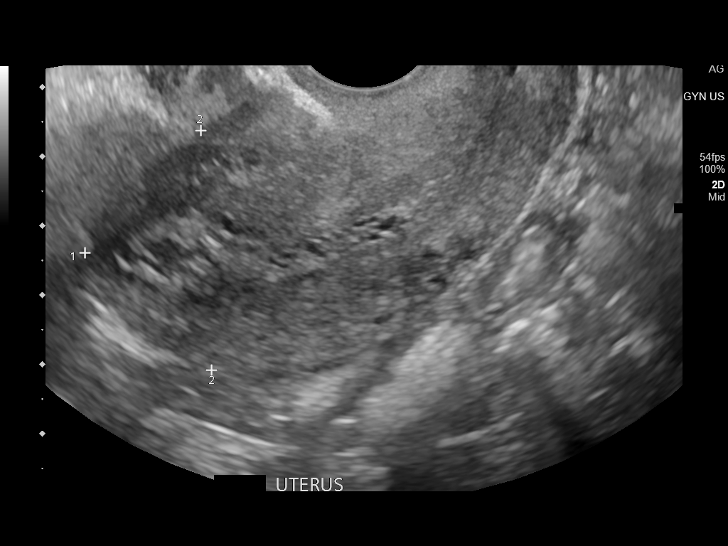
[im 26/44]
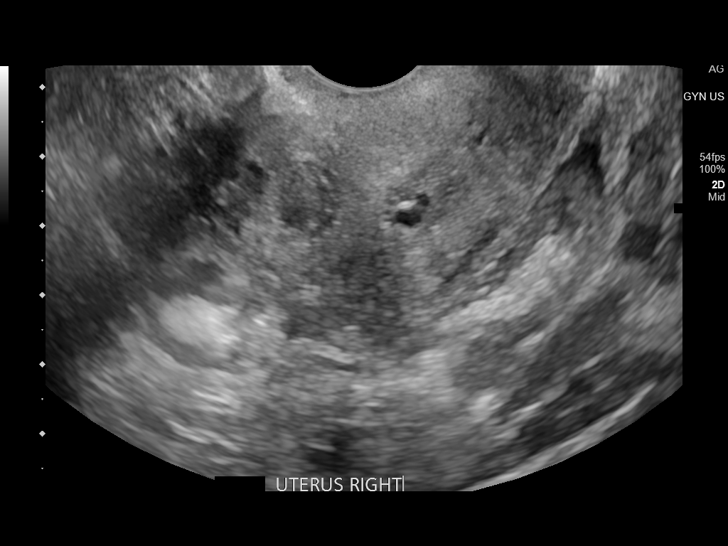
[im 29/44]
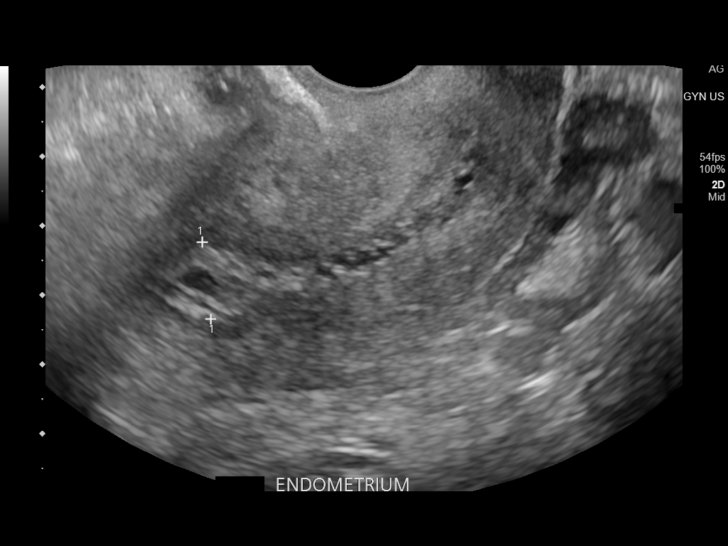
[im 33/44]
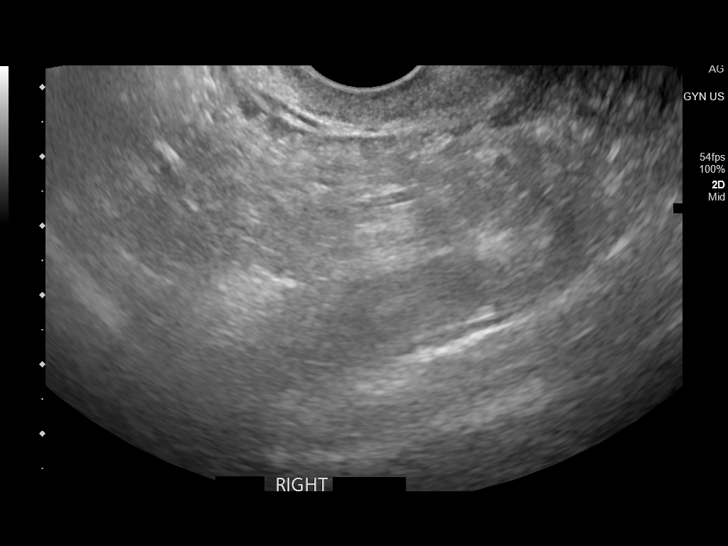
[im 36/44]
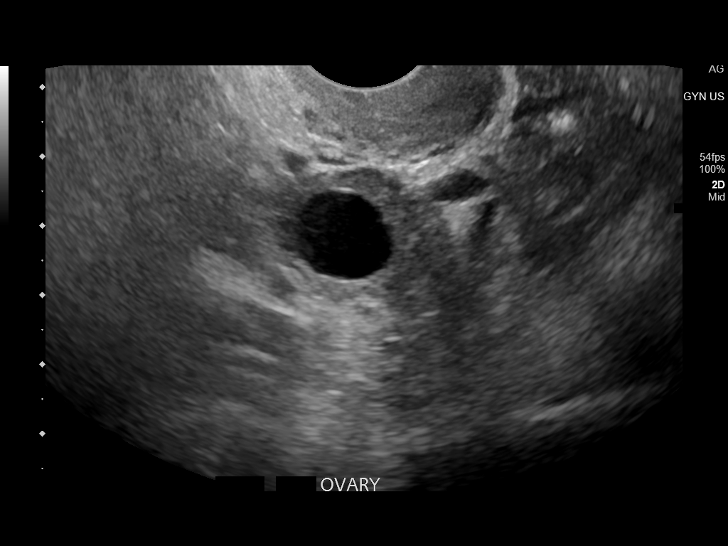
[im 40/44]
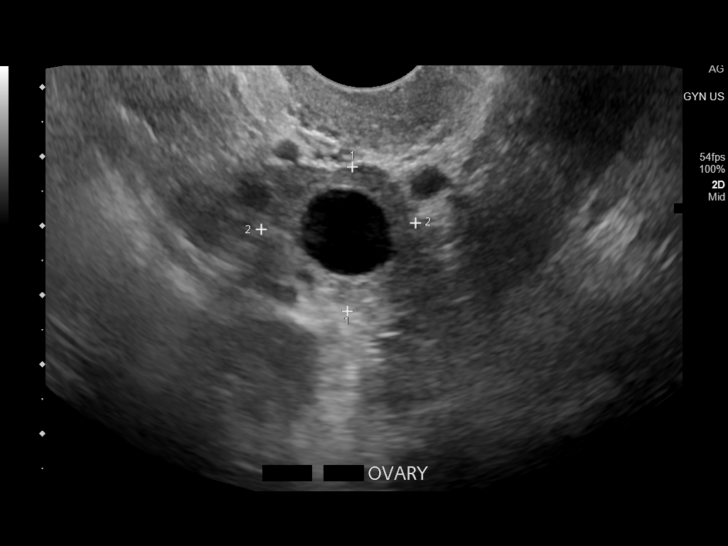
[im 44/44]
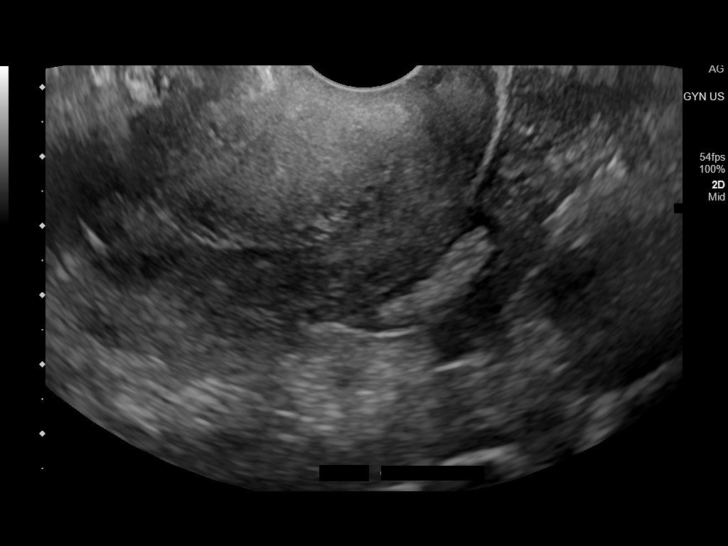

[13 of 25 positions shown; findings below may reference images not displayed]

FINDINGS: Uterus

Measurements: 7.4 x 3.5 x 4.8 cm = volume: 65 mL. Anteverted. Small
intramural leiomyoma anterior wall 11 x 11 x 13 mm. Mildly
heterogeneous myometrium. No additional masses.

Endometrium

Thickness: 11 mm. Heterogeneous. Small amount of endometrial fluid
versus cystic change at fundal portion of endometrial canal, with
minimal cystic change along remainder of endometrium. No dominant
mass.

Right ovary

Not identified, surgically absent

Left ovary

Measurements: 2.2 x 2 x 1 x 2.0 cm = volume: 4.3 mL. Small simple
cyst within LEFT ovary 13 x 13 x 12 mm; No followup imaging
recommended. Note: This recommendation does not apply to
premenarchal patients or to those with increased risk (genetic,
family history, elevated tumor markers or other high-risk factors)
of ovarian cancer. Reference: Radiology [DATE]):[id number].

Other findings

No free pelvic fluid.  No adnexal masses.
IMPRESSION: Post RIGHT oophorectomy.

13 mm intramural leiomyoma anterior uterus.

Thickened heterogeneous endometrial complex 11 mm thick containing
cystic changes and question small amount of endometrial fluid at
fundus; endometrial thickness is considered abnormal for an
asymptomatic post-menopausal female. Endometrial sampling should be
considered to exclude carcinoma.

These results will be called to the ordering clinician or
representative by the Radiologist Assistant, and communication
documented in the PACS or [REDACTED].

## 2022-01-26 NOTE — Progress Notes (Signed)
Subjective:   Susan [name] is a 71 y.o. female who presents for Medicare Annual (Subsequent) preventive examination.  I connected with Susan [name] today by telephone and verified that I am speaking with the correct person using two identifiers. Location patient: home Location provider: work Persons participating in the virtual visit: patient, Marine scientist.    I discussed the limitations, risks, security and privacy concerns of performing an evaluation and management service by telephone and the availability of in person appointments. I also discussed with the patient that there may be a patient responsible charge related to this service. The patient expressed understanding and verbally consented to this telephonic visit.    Interactive audio and video telecommunications were attempted between this provider and patient, however failed, due to patient having technical difficulties OR patient did not have access to video capability.  We continued and completed visit with audio only.  Some vital signs may be absent or patient reported.   Time Spent with patient on telephone encounter: 22 minutes  Review of Systems     Cardiac Risk Factors include: advanced age (>84men, >47 women);hypertension     Objective:    Today's Vitals   01/27/22 1446  Weight: 158 lb (71.7 kg)  Height: 5\' 7"  (1.702 m)   Body mass index is 24.75 kg/m.  Advanced Directives 01/27/2022 12/21/2021 01/26/2021 01/14/2021 12/17/2020 06/18/2020 01/17/2020  Does Patient Have a Medical Advance Directive? Yes No Yes Yes Yes No Yes  Type of Paramedic of [place];Living will - [name];Living will [place];Living will [place];Living will - Living will;Healthcare Power of Attorney  Does patient want to make changes to medical advance directive? Yes (MAU/Ambulatory/Procedural Areas - Information given) - - No - Patient declined Yes (ED -  Information included in AVS) - No - Patient declined  Copy of [place] in Chart? - - No - copy requested No - copy requested - - No - copy requested  Would patient like information on creating a medical advance directive? - No - Patient declined - - - No - Patient declined No - Patient declined    Current Medications (verified) Outpatient Encounter Medications as of 01/27/2022  Medication Sig   acetaminophen (TYLENOL) 500 MG tablet Take 1,000 mg by mouth every 6 (six) hours as needed.   ALPRAZolam (XANAX) 0.5 MG tablet Take 1 tablet (0.5 mg total) by mouth at bedtime as needed. for sleep   calcium citrate-vitamin D 500-500 MG-UNIT chewable tablet Chew 1 tablet by mouth 2 (two) times daily.   cholecalciferol (VITAMIN D) 1000 units tablet Take 1,000 Units by mouth daily.   famotidine (PEPCID) 20 MG tablet Take 20 mg by mouth as needed for heartburn or indigestion.   fluticasone (FLONASE) 50 MCG/ACT nasal spray Place 2 sprays into both nostrils as needed.    metoprolol succinate (TOPROL-XL) 25 MG 24 hr tablet Take 0.5 tablets (12.5 mg total) by mouth daily.   Multiple Vitamin (MULTIVITAMIN) tablet Take 1 tablet by mouth daily.   NAPROXEN PO Take 1 tablet by mouth every 12 (twelve) hours as needed (pain).   psyllium (REGULOID) 0.52 g capsule Take 0.52 g by mouth 3 (three) times daily.    SIMETHICONE-80 PO Take 80 mg by mouth as needed (gas).    tamoxifen (NOLVADEX) 20 MG tablet Take 1 tablet (20 mg total) by mouth daily.   doxycycline (VIBRAMYCIN) 100 MG capsule Take 1 capsule (100 mg total) by mouth 2 (  two) times daily. (Patient not taking: Reported on 12/06/2021)   FLUZONE HIGH-DOSE QUADRIVALENT 0.7 ML SUSY    metroNIDAZOLE (METROGEL) 1 % gel Apply 1 application topically as needed. (Patient not taking: Reported on 07/21/2021)   misoprostol (CYTOTEC) 200 MCG tablet Take 2 tablet the night prior to procedure and 2 the morning of the procedure (Patient not taking: Reported on  12/06/2021)   No facility-administered encounter medications on file as of 01/27/2022.    Allergies (verified) Patient has no known allergies.   History: Past Medical History:  Diagnosis Date   Arthritis    Basal cell carcinoma 05/26/2016   R chest parasternal   Basal cell carcinoma 01/21/2015   L nose supratip    Breast cancer (HCC)    Left DCIS   Cancer (HCC)    Complication of anesthesia    Dysrhythmia    PSVT on metoprolol   Family history of breast cancer    Gastric reflux 90's   GERD (gastroesophageal reflux disease)    Headache    H/O MIGRAINES   Hiatal hernia 2003   History of Coronary CT for Calcium Scoring    a. 11/2018 CT Cor Ca2+ = Zero.   Hypertension    Mitral regurgitation    a. 03/2017 Echo: EF 55-60%, no nrwma, Mod MR; b. 03/2018 Echo: EF 60-65%, no rwma. Gr2 DD. Mild MV prolapse of ant leaflet w/ mild to mod mR. Nl RV fxn. Mild to mod TR.   Personal history of radiation therapy 2019   LEFT lumpectomy   PONV (postoperative nausea and vomiting)    N/V   PSVT (paroxysmal supraventricular tachycardia) (HCC)    a. 03/2017 Event Monitor: Avg HR 66, 10 episodes of SVT up to 42.8 secs, fastest 148 bpm. 8 beats WCT (179 bpm) - VT vs SVT w/ aberrancy. Rare PACs (2.6% total beats).   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   R prox lat bicep   Past Surgical History:  Procedure Laterality Date   AUGMENTATION MAMMAPLASTY Bilateral 01/17/2020   BREAST BIOPSY Left 06/13/2018   Affirm Bx- coil clip   atypical sclerosing and DCIS   BREAST ENHANCEMENT SURGERY Bilateral 01/17/2020   Procedure: MAMMOPLASTY AUGMENTATION WITH PROSTHESIS (BREAST);  Surgeon: Cindra Presume, MD;  Location: [facility];  Service: Plastics;  Laterality: Bilateral;  2.5 hours for total case   BREAST EXCISIONAL BIOPSY Right 2000   benign   BREAST LUMPECTOMY Left 06/26/2018   atypical sclerosing lesion and DCIS bracketing   BREAST LUMPECTOMY WITH NEEDLE LOCALIZATION Left 06/26/2018    Procedure: BREAST LUMPECTOMY WITH NEEDLE LOCALIZATION;  Surgeon: Jules Husbands, MD;  Location: [facility];  Service: General;  Laterality: Left;   CHOLECYSTECTOMY     OVARIAN CYST SURGERY     SCAR REVISION Left 01/17/2020   Procedure: POSSIBLE SCAR REVISION TO LEFT BREAST;  Surgeon: Cindra Presume, MD;  Location: [facility];  Service: Plastics;  Laterality: Left;   SKIN FULL THICKNESS GRAFT  01/17/2020   Procedure: SKIN GRAFT FULL THICKNESS;  Surgeon: Cindra Presume, MD;  Location: [facility];  Service: Plastics;;   TONSILLECTOMY     age 58   UPPER GI ENDOSCOPY  2003   Family History  Problem Relation Age of Onset   Hyperlipidemia Mother    Hypertension Mother    COPD Mother    Heart failure Mother        deceased 28   Stroke Father 1  Hypertension Father    Hypertension Sister    Hypertension Brother    Healthy Brother    Breast cancer Cousin 2       daughter of paternal aunt; currently 17   Breast cancer Cousin 63       daughter of paternal uncle; currently 61   Breast cancer Cousin 12       daughter of maternal uncle; deceased 28   Testicular cancer Other 59       sister's son; currently 71   Melanoma Paternal Uncle    Breast cancer Cousin 33       daughter of same maternal uncle; currently 23   Social History   Socioeconomic History   Marital status: Married    Spouse name: Richard   Number of children: 2   Years of education: College   Highest education level: Not on file  Occupational History   Not on file  Tobacco Use   Smoking status: Never   Smokeless tobacco: Never  Vaping Use   Vaping Use: Never used  Substance and Sexual Activity   Alcohol use: Yes    Alcohol/week: 2.0 standard drinks    Types: 2 Glasses of wine per week    Comment: daily WINE   Drug use: No   Sexual activity: Not Currently    Birth control/protection: Post-menopausal  Other Topics Concern   Not on file  Social History Narrative   03/30/20    From: the area, [place]: with husband Delfino Lovett [phone])   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      Family: 2 step children - Joycelyn Schmid and Boronda - 3 grandchildren, good relationships      Enjoys: garden, flowers      Exercise: walking, walking the dog 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) - daily, average 6 mile/day   Diet: fairly good       Safety   Seat belts: Yes    Guns: Yes  and secure   Safe in relationships: Yes    Social Determinants of Radio broadcast assistant Strain: Low Risk    Difficulty of Paying Living Expenses: Not hard at all  Food Insecurity: No Food Insecurity   Worried About Charity fundraiser in the Last Year: Never true   Arboriculturist in the Last Year: Never true  Transportation Needs: No Transportation Needs   Lack of Transportation (Medical): No   Lack of Transportation (Non-Medical): No  Physical Activity: Sufficiently Active   Days of Exercise per Week: 6 days   Minutes of Exercise per Session: 60 min  Stress: No Stress Concern Present   Feeling of Stress : Not at all  Social Connections: Moderately Isolated   Frequency of Communication with Friends and Family: More than three times a week   Frequency of Social Gatherings with Friends and Family: More than three times a week   Attends Religious Services: Never   Marine scientist or Organizations: No   Attends Music therapist: Never   Marital Status: Married    Tobacco Counseling Counseling given: Not Answered   Clinical Intake:  Pre-visit preparation completed: Yes  Pain : No/denies pain     BMI - recorded: 24.75 Nutritional Status: BMI of 19-24  Normal Nutritional Risks: None Diabetes: No  How often do you need to have someone help you when you read instructions, pamphlets, or other written materials from your doctor or pharmacy?: 1 - Never  Diabetic? No  Interpreter  Needed?: No  Information entered by :: Orrin Brigham LPN   Activities of Daily Living In your  present state of health, do you have any difficulty performing the following activities: 01/27/2022  Hearing? N  Vision? Y  Difficulty concentrating or making decisions? N  Walking or climbing stairs? N  Dressing or bathing? N  Doing errands, shopping? N  Preparing Food and eating ? N  Using the Toilet? N  In the past six months, have you accidently leaked urine? Y  Comment occasionally when coughing  Do you have problems with loss of bowel control? N  Managing your Medications? N  Managing your Finances? N  Housekeeping or managing your Housekeeping? N  Some recent data might be hidden    Patient Care Team: Lesleigh Noe, MD as PCP - General (Family Medicine) Minna Merritts, MD as PCP - Cardiology (Cardiology) Tamsen Meek, MD as Referring Physician (Dermatology) Sindy Guadeloupe, MD as Consulting Physician (Oncology)  Indicate any recent Medical Services you may have received from other than [facility] providers in the past year (date may be approximate).     Assessment:   This is a routine wellness examination for Susan [name].  Hearing/Vision screen Hearing Screening - Comments:: No issues Vision Screening - Comments:: Last exam 05/2021, Dr. Wallace Going , wears glasses  Dietary issues and exercise activities discussed: Current Exercise Habits: Home exercise routine, Type of exercise: walking, Time (Minutes): 60, Frequency (Times/Week): 6, Weekly Exercise (Minutes/Week): 360, Intensity: Intense   Goals Addressed             This Visit's Progress    Patient Stated       Would like to maintain current routine       Depression Screen PHQ 2/9 Scores 01/27/2022 01/26/2021 06/12/2019 05/30/2018 05/29/2017 01/25/2016  PHQ - 2 Score 0 0 0 0 0 0  PHQ- 9 Score - 0 - - - -    Fall Risk Fall Risk  01/27/2022 01/26/2021 07/21/2020 06/12/2019 07/11/2018  Falls in the past year? 0 0 0 0 No  Comment - - Emmi Telephone Survey: data to providers prior to load - -  Number falls in past yr: 0 0  - - -  Injury with Fall? 0 0 - - -  Risk for fall due to : No Fall Risks Medication side effect - - -  Follow up Falls prevention discussed Falls evaluation completed;Falls prevention discussed - - -    FALL RISK PREVENTION PERTAINING TO THE HOME:  Any stairs in or around the home? No  If so, are there any without handrails? No  Home free of loose throw rugs in walkways, pet beds, electrical cords, etc? Yes  Adequate lighting in your home to reduce risk of falls? Yes   ASSISTIVE DEVICES UTILIZED TO PREVENT FALLS:  Life alert? No  Use of a cane, walker or w/c? No  Grab bars in the bathroom? Yes  Shower chair or bench in shower? Yes  Elevated toilet seat or a handicapped toilet? No   TIMED UP AND GO:  Was the test performed? No .    Cognitive Function: Normal cognitive status assessed by  this Nurse Health Advisor. No abnormalities found.   MMSE - Mini Mental State Exam 01/26/2021 05/29/2017  Orientation to time 5 5  Orientation to Place 5 5  Registration 3 3  Attention/ Calculation 5 0  Recall 3 3  Language- name 2 objects - 0  Language- repeat 1 1  Language- follow  3 step command - 3  Language- read & follow direction - 0  Write a sentence - 0  Copy design - 0  Total score - 20        Immunizations Immunization History  Administered Date(s) Administered   Fluad Quad(high Dose 65+) 09/05/2019, 09/22/2021   Influenza, High Dose Seasonal PF 09/18/2018   Influenza,inj,Quad PF,6+ Mos 10/02/2013, 09/23/2015, 09/16/2016, 10/03/2017   Influenza-Unspecified 09/25/2020   PFIZER(Purple Top)SARS-COV-2 Vaccination 02/02/2020, 02/27/2020, 10/13/2020, 06/21/2021   Pneumococcal Conjugate-13 01/25/2016   Pneumococcal Polysaccharide-23 05/29/2017   Td 12/03/2008   Zoster Recombinat (Shingrix) 04/27/2020, 06/27/2020   Zoster, Live 09/29/2015    TDAP status: Due, Education has been provided regarding the importance of this vaccine. Advised may receive this vaccine at local  pharmacy or Health Dept. Aware to provide a copy of the vaccination record if obtained from local pharmacy or Health Dept. Verbalized acceptance and understanding.  Flu Vaccine status: Up to date  Pneumococcal vaccine status: Up to date  Covid-19 vaccine status: Information provided on how to obtain vaccines.   Qualifies for Shingles Vaccine? Yes   Zostavax completed Yes   Shingrix Completed?: Yes  Screening Tests Health Maintenance  Topic Date Due   Hepatitis C Screening  Never done   COVID-19 Vaccine (5 - Booster for Pfizer series) 08/16/2021   TETANUS/TDAP  01/27/2024 (Originally 12/03/2018)   MAMMOGRAM  06/18/2023   COLONOSCOPY (Pts 45-82yrs Insurance coverage will need to be confirmed)  06/16/2027   Pneumonia Vaccine 108+ Years old  Completed   INFLUENZA VACCINE  Completed   DEXA SCAN  Completed   Zoster Vaccines- Shingrix  Completed   HPV VACCINES  Aged Out   COLON CANCER SCREENING ANNUAL FOBT  Discontinued    Health Maintenance  Health Maintenance Due  Topic Date Due   Hepatitis C Screening  Never done   COVID-19 Vaccine (5 - Booster for [place] series) 08/16/2021    Colorectal cancer screening: Type of screening: Colonoscopy. Completed 06/15/17. Repeat every 10 years  Mammogram status: Completed 06/17/21. Repeat every year  Bone Density status: Completed 08/31/21. Results reflect: Bone density results: OSTEOPENIA. Repeat every 2 years.  Lung Cancer Screening: (Low Dose CT Chest recommended if Age 72-80 years, 30 pack-year currently smoking OR have quit w/in 15years.) does not qualify.     Additional Screening:  Hepatitis C Screening: does qualify  Vision Screening: Recommended annual ophthalmology exams for early detection of glaucoma and other disorders of the eye. Is the patient up to date with their annual eye exam?  Yes  Who is the provider or what is the name of the office in which the patient attends annual eye exams? Dr. Wallace Going   Dental Screening:  Recommended annual dental exams for proper oral hygiene  Community Resource Referral / Chronic Care Management: CRR required this visit?  No   CCM required this visit?  No      Plan:     I have personally reviewed and noted the following in the patients chart:   Medical and social history Use of alcohol, tobacco or illicit drugs  Current medications and supplements including opioid prescriptions.  Functional ability and status Nutritional status Physical activity Advanced directives List of other physicians Hospitalizations, surgeries, and ER visits in previous 12 months Vitals Screenings to include cognitive, depression, and falls Referrals and appointments  In addition, I have reviewed and discussed with patient certain preventive protocols, quality metrics, and best practice recommendations. A written personalized care plan for preventive services as  well as general preventive health recommendations were provided to patient.   Due to this being a telephonic visit, the after visit summary with patients personalized plan was offered to patient via mail or my-chart. Patient would like to access on my-chart.   Loma Messing, LPN   06/01/7372   Nurse Health Advisor  Nurse Notes: none

## 2022-01-27 ENCOUNTER — Ambulatory Visit (INDEPENDENT_AMBULATORY_CARE_PROVIDER_SITE_OTHER): Payer: PPO

## 2022-01-27 VITALS — Ht 67.0 in | Wt 158.0 lb

## 2022-01-27 DIAGNOSIS — Z0000 Encounter for general adult medical examination without abnormal findings: Secondary | ICD-10-CM

## 2022-01-27 DIAGNOSIS — Z1211 Encounter for screening for malignant neoplasm of colon: Secondary | ICD-10-CM

## 2022-01-27 NOTE — Patient Instructions (Signed)
Susan [name] , Thank you for taking time to complete your Medicare Wellness Visit. I appreciate your ongoing commitment to your health goals. Please review the following plan we discussed and let me know if I can assist you in the future.   Screening recommendations/referrals: Colonoscopy: due, last cologuard 06/15/17, ordered cologuard today, someone will contact you Mammogram: up to date, completed 06/17/21, due 06/17/22 Bone Density: up to date, completed 08/31/21, due 09/01/23 Recommended yearly ophthalmology/optometry visit for glaucoma screening and checkup Recommended yearly dental visit for hygiene and checkup  Vaccinations: Influenza vaccine: up to date Pneumococcal vaccine: up to date Tdap vaccine: up to date Shingles vaccine: up to date   Covid-19:newest booster available at your local pharmacy  Advanced directives: Please bring a copy of Living Will and/or [place] for your chart.   Conditions/risks identified: see problem list  Next appointment: Follow up in one year for your annual wellness visit 01/30/23 @ 2:45pm, this will be a telephone visit    Preventive Care 65 Years and Older, Female Preventive care refers to lifestyle choices and visits with your health care provider that can promote health and wellness. What does preventive care include? A yearly physical exam. This is also called an annual well check. Dental exams once or twice a year. Routine eye exams. Ask your health care provider how often you should have your eyes checked. Personal lifestyle choices, including: Daily care of your teeth and gums. Regular physical activity. Eating a healthy diet. Avoiding tobacco and drug use. Limiting alcohol use. Practicing safe sex. Taking low-dose aspirin every day. Taking vitamin and mineral supplements as recommended by your health care provider. What happens during an annual well check? The services and screenings done by your health care provider  during your annual well check will depend on your age, overall health, lifestyle risk factors, and family history of disease. Counseling  Your health care provider may ask you questions about your: Alcohol use. Tobacco use. Drug use. Emotional well-being. Home and relationship well-being. Sexual activity. Eating habits. History of falls. Memory and ability to understand (cognition). Work and work Statistician. Reproductive health. Screening  You may have the following tests or measurements: Height, weight, and BMI. Blood pressure. Lipid and cholesterol levels. These may be checked every 5 years, or more frequently if you are over 35 years old. Skin check. Lung cancer screening. You may have this screening every year starting at age 42 if you have a 30-pack-year history of smoking and currently smoke or have quit within the past 15 years. Fecal occult blood test (FOBT) of the stool. You may have this test every year starting at age 61. Flexible sigmoidoscopy or colonoscopy. You may have a sigmoidoscopy every 5 years or a colonoscopy every 10 years starting at age 11. Hepatitis C blood test. Hepatitis B blood test. Sexually transmitted disease (STD) testing. Diabetes screening. This is done by checking your blood sugar (glucose) after you have not eaten for a while (fasting). You may have this done every 1-3 years. Bone density scan. This is done to screen for osteoporosis. You may have this done starting at age 43. Mammogram. This may be done every 1-2 years. Talk to your health care provider about how often you should have regular mammograms. Talk with your health care provider about your test results, treatment options, and if necessary, the need for more tests. Vaccines  Your health care provider may recommend certain vaccines, such as: Influenza vaccine. This is recommended every year.  Tetanus, diphtheria, and acellular pertussis (Tdap, Td) vaccine. You may need a Td booster every  10 years. Zoster vaccine. You may need this after age 53. Pneumococcal 13-valent conjugate (PCV13) vaccine. One dose is recommended after age 39. Pneumococcal polysaccharide (PPSV23) vaccine. One dose is recommended after age 74. Talk to your health care provider about which screenings and vaccines you need and how often you need them. This information is not intended to replace advice given to you by your health care provider. Make sure you discuss any questions you have with your health care provider. Document Released: 01/08/2016 Document Revised: 08/31/2016 Document Reviewed: 10/13/2015 Elsevier Interactive Patient Education  2017 [employer] Prevention in the Home Falls can cause injuries. They can happen to people of all ages. There are many things you can do to make your home safe and to help prevent falls. What can I do on the outside of my home? Regularly fix the edges of walkways and driveways and fix any cracks. Remove anything that might make you trip as you walk through a door, such as a raised step or threshold. Trim any bushes or trees on the path to your home. Use bright outdoor lighting. Clear any walking paths of anything that might make someone trip, such as rocks or tools. Regularly check to see if handrails are loose or broken. Make sure that both sides of any steps have handrails. Any raised decks and porches should have guardrails on the edges. Have any leaves, snow, or ice cleared regularly. Use sand or salt on walking paths during winter. Clean up any spills in your garage right away. This includes oil or grease spills. What can I do in the bathroom? Use night lights. Install grab bars by the toilet and in the tub and shower. Do not use towel bars as grab bars. Use non-skid mats or decals in the tub or shower. If you need to sit down in the shower, use a plastic, non-slip stool. Keep the floor dry. Clean up any water that spills on the floor as soon as it  happens. Remove soap buildup in the tub or shower regularly. Attach bath mats securely with double-sided non-slip rug tape. Do not have throw rugs and other things on the floor that can make you trip. What can I do in the bedroom? Use night lights. Make sure that you have a light by your bed that is easy to reach. Do not use any sheets or blankets that are too big for your bed. They should not hang down onto the floor. Have a firm chair that has side arms. You can use this for support while you get dressed. Do not have throw rugs and other things on the floor that can make you trip. What can I do in the kitchen? Clean up any spills right away. Avoid walking on wet floors. Keep items that you use a lot in easy-to-reach places. If you need to reach something above you, use a strong step stool that has a grab bar. Keep electrical cords out of the way. Do not use floor polish or wax that makes floors slippery. If you must use wax, use non-skid floor wax. Do not have throw rugs and other things on the floor that can make you trip. What can I do with my stairs? Do not leave any items on the stairs. Make sure that there are handrails on both sides of the stairs and use them. Fix handrails that are broken or loose.  Make sure that handrails are as long as the stairways. Check any carpeting to make sure that it is firmly attached to the stairs. Fix any carpet that is loose or worn. Avoid having throw rugs at the top or bottom of the stairs. If you do have throw rugs, attach them to the floor with carpet tape. Make sure that you have a light switch at the top of the stairs and the bottom of the stairs. If you do not have them, ask someone to add them for you. What else can I do to help prevent falls? Wear shoes that: Do not have high heels. Have rubber bottoms. Are comfortable and fit you well. Are closed at the toe. Do not wear sandals. If you use a stepladder: Make sure that it is fully opened.  Do not climb a closed stepladder. Make sure that both sides of the stepladder are locked into place. Ask someone to hold it for you, if possible. Clearly mark and make sure that you can see: Any grab bars or handrails. First and last steps. Where the edge of each step is. Use tools that help you move around (mobility aids) if they are needed. These include: Canes. Walkers. Scooters. Crutches. Turn on the lights when you go into a dark area. Replace any light bulbs as soon as they burn out. Set up your furniture so you have a clear path. Avoid moving your furniture around. If any of your floors are uneven, fix them. If there are any pets around you, be aware of where they are. Review your medicines with your doctor. Some medicines can make you feel dizzy. This can increase your chance of falling. Ask your doctor what other things that you can do to help prevent falls. This information is not intended to replace advice given to you by your health care provider. Make sure you discuss any questions you have with your health care provider. Document Released: 10/08/2009 Document Revised: 05/19/2016 Document Reviewed: 01/16/2015 Elsevier Interactive Patient Education  2017 [employer].

## 2022-02-04 ENCOUNTER — Other Ambulatory Visit: Payer: Self-pay

## 2022-02-04 ENCOUNTER — Encounter: Payer: Self-pay | Admitting: Radiation Oncology

## 2022-02-04 ENCOUNTER — Ambulatory Visit
Admission: RE | Admit: 2022-02-04 | Discharge: 2022-02-04 | Disposition: A | Discharge status: Home / Self Care | Payer: PPO | Source: Ambulatory Visit | Attending: Radiation Oncology | Admitting: Radiation Oncology

## 2022-02-04 VITALS — Temp 98.5°F | Resp 16 | Wt 157.6 lb

## 2022-02-04 DIAGNOSIS — Z853 Personal history of malignant neoplasm of breast: Secondary | ICD-10-CM | POA: Diagnosis not present

## 2022-02-04 DIAGNOSIS — Z08 Encounter for follow-up examination after completed treatment for malignant neoplasm: Secondary | ICD-10-CM | POA: Diagnosis not present

## 2022-02-04 DIAGNOSIS — D0512 Intraductal carcinoma in situ of left breast: Secondary | ICD-10-CM | POA: Diagnosis not present

## 2022-02-04 DIAGNOSIS — Z79810 Long term (current) use of selective estrogen receptor modulators (SERMs): Secondary | ICD-10-CM | POA: Diagnosis not present

## 2022-02-04 DIAGNOSIS — Z170 Estrogen receptor positive status [ER+]: Secondary | ICD-10-CM | POA: Insufficient documentation

## 2022-02-04 DIAGNOSIS — Z923 Personal history of irradiation: Secondary | ICD-10-CM | POA: Diagnosis not present

## 2022-02-04 NOTE — Progress Notes (Signed)
Radiation Oncology Follow up Note  Name: Susan [name]   Date:   02/04/2022 MRN:  [id number] DOB: 05-Jul-1951    This 71 y.o. 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  REFERRING PROVIDER: Lesleigh Noe, MD  HPI: Patient is a 71 year old female now 3 years having completed pleated whole breast radiation to her left breast for ER/PR positive ductal carcinoma in situ.  Seen today in routine follow-up she is doing well.  She specifically denies breast tenderness cough or bone pain..  She had mammograms last June which I have reviewed were BI-RADS 2 benign scheduled for repeat mammograms in June.  She is currently on tamoxifen tolerating that well without side effect.  COMPLICATIONS OF TREATMENT: none  FOLLOW UP COMPLIANCE: keeps appointments   PHYSICAL EXAM:  Temp 98.5 F (36.9 C) (Tympanic)    Resp 16    Wt 157 lb 9.6 oz (71.5 kg)    BMI 24.68 kg/m  Lungs are clear to A&P cardiac examination essentially unremarkable with regular rate and rhythm. No dominant mass or nodularity is noted in either breast in 2 positions examined. Incision is well-healed. No axillary or supraclavicular adenopathy is appreciated. Cosmetic result is excellent.  Well-developed well-nourished patient in NAD. HEENT reveals PERLA, EOMI, discs not visualized.  Oral cavity is clear. No oral mucosal lesions are identified. Neck is clear without evidence of cervical or supraclavicular adenopathy. Lungs are clear to A&P. Cardiac examination is essentially unremarkable with regular rate and rhythm without murmur rub or thrill. Abdomen is benign with no organomegaly or masses noted. Motor sensory and DTR levels are equal and symmetric in the upper and lower extremities. Cranial nerves II through XII are grossly intact. Proprioception is intact. No peripheral adenopathy or edema is identified. No motor or sensory levels  are noted. Crude visual fields are within normal range.  RADIOLOGY RESULTS: Mammograms reviewed compatible with above-stated findings  PLAN: Present time patient is now at over 3 years with no evidence of disease.  On pleased with her overall progress.  She continues follow-up care with medical oncology.  I am going to discontinue follow-up care at this point.  Patient knows to call at anytime with any concerns.  I would like to take this opportunity to thank you for allowing me to participate in the care of your patient.Noreene Filbert, MD

## 2022-02-09 DIAGNOSIS — Z1211 Encounter for screening for malignant neoplasm of colon: Secondary | ICD-10-CM | POA: Diagnosis not present

## 2022-02-17 LAB — COLOGUARD: COLOGUARD: NEGATIVE

## 2022-03-17 ENCOUNTER — Telehealth (INDEPENDENT_AMBULATORY_CARE_PROVIDER_SITE_OTHER): Payer: PPO | Admitting: Family Medicine

## 2022-03-17 ENCOUNTER — Telehealth: Payer: Self-pay

## 2022-03-17 ENCOUNTER — Encounter: Payer: Self-pay | Admitting: Family Medicine

## 2022-03-17 VITALS — BP 136/75 | HR 72 | Wt 158.0 lb

## 2022-03-17 DIAGNOSIS — U071 COVID-19: Secondary | ICD-10-CM

## 2022-03-17 MED ORDER — MOLNUPIRAVIR EUA 200MG CAPSULE: 4.0000 | ORAL_CAPSULE | Freq: Two times a day (BID) | ORAL | 0 refills | Status: AC

## 2022-03-17 NOTE — Progress Notes (Signed)
? ? ?I connected with Susan [name] on 03/17/22 at 12:20 PM EDT by video and verified that I am speaking with the correct person using two identifiers. ?  ?I discussed the limitations, risks, security and privacy concerns of performing an evaluation and management service by video and the availability of in person appointments. I also discussed with the patient that there may be a patient responsible charge related to this service. The patient expressed understanding and agreed to proceed. ? ?Patient location: Home ?Provider Location: [facility] ?Participants: Susan [name] and Deserai Cansler ? ? ?Subjective:  ? ?  ?Susan [name] is a 71 y.o. female presenting for Covid Positive, Nasal Congestion, and Cough (Slight but not bad.) ?  ? ? ?Cough ?Pertinent negatives include no chest pain, chills, fever, myalgias, sore throat or shortness of breath.  ? ?#Covid positive ?- 03/16/2022 ?- husband started getting sick and tested positive for covid ?- she started getting symptoms congestion, hoarse voice,  ?- cough slightly ?-  ? ? ?Review of Systems  ?Constitutional:  Negative for chills and fever.  ?HENT:  Positive for congestion. Negative for sore throat.   ?Respiratory:  Positive for cough. Negative for shortness of breath.   ?Cardiovascular:  Negative for chest pain.  ?Gastrointestinal:  Negative for diarrhea, nausea and vomiting.  ?Musculoskeletal:  Negative for arthralgias and myalgias.  ? ? ?Social History  ? ?Tobacco Use  ?Smoking Status Never  ?Smokeless Tobacco Never  ? ? ? ?   ?Objective:  ? ?BP Readings from Last 3 Encounters:  ?03/17/22 136/75  ?12/21/21 140/64  ?12/06/21 120/70  ? ?Wt Readings from Last 3 Encounters:  ?03/17/22 158 lb (71.7 kg)  ?02/04/22 157 lb 9.6 oz (71.5 kg)  ?01/27/22 158 lb (71.7 kg)  ? ?BP 136/75   Pulse 72   Wt 158 lb (71.7 kg)   BMI 24.75 kg/m?  ? ?Physical Exam ?Constitutional:   ?   Appearance: Normal appearance. She is not ill-appearing.  ?HENT:  ?   Head:  Normocephalic and atraumatic.  ?   Right Ear: External ear normal.  ?   Left Ear: External ear normal.  ?Eyes:  ?   Conjunctiva/sclera: Conjunctivae normal.  ?Pulmonary:  ?   Effort: Pulmonary effort is normal. No respiratory distress.  ?Neurological:  ?   Mental Status: She is alert. Mental status is at baseline.  ?Psychiatric:     ?   Mood and Affect: Mood normal.     ?   Behavior: Behavior normal.     ?   Thought Content: Thought content normal.     ?   Judgment: Judgment normal.  ? ? ? ? ? ?   ?Assessment & Plan:  ? ?Problem List Items Addressed This Visit   ?None ?Visit Diagnoses   ? ? COVID-19 virus infection    -  Primary  ? Relevant Medications  ? molnupiravir EUA (LAGEVRIO) 200 mg CAPS capsule  ? ?  ? ?Patient is at increased risk for developing severe covid due to age, HTN, breast cancer. They are eligible for anti-viral medication.  ? ?Discussed mulnupiravir and they would like to start. Normal liver and kidney function.  ? ?Lab Results  ?Component Value Date  ? ALT 20 06/21/2021  ? AST 22 06/21/2021  ? ALKPHOS 39 06/21/2021  ? BILITOT 1.1 06/21/2021  ? ? ?Lab Results  ?Component Value Date  ? CREATININE 0.79 06/21/2021  ? ? ? ?Medication sent to pharmacy ? ?Reviewed ER and  return precautions ? ?Reviewed isolation guidelines.  ? ? ?Return if symptoms worsen or fail to improve. ? ?Susan Noe, MD ? ?

## 2022-03-17 NOTE — Telephone Encounter (Signed)
Pt added on for today

## 2022-03-17 NOTE — Telephone Encounter (Addendum)
Runny nose and slight cough started yesterday. She did home covid test this am and it was positive. No fever. No shortness of breath. 4 covid vaccines (not BV) She is taking Robitussin DM. Asking is there anything special she needs to do. She also asked about quarantine time. I advised she can go out after 5 days if she is seeing improvement in symptoms but to wear a mask the next 5-10 days after that.  ?

## 2022-03-17 NOTE — Telephone Encounter (Signed)
Agree with advice per nurse.  ? ?If she wants to take antiviral medication I can add her on for a virtual appointment today.  ?

## 2022-03-17 NOTE — Telephone Encounter (Signed)
Patient scheduled at 12:20pm my chart visit ?

## 2022-06-01 ENCOUNTER — Ambulatory Visit (INDEPENDENT_AMBULATORY_CARE_PROVIDER_SITE_OTHER): Payer: PPO | Admitting: Family Medicine

## 2022-06-01 VITALS — BP 124/70 | HR 62 | Temp 97.4°F | Ht 67.0 in | Wt 158.0 lb

## 2022-06-01 DIAGNOSIS — G4700 Insomnia, unspecified: Secondary | ICD-10-CM | POA: Diagnosis not present

## 2022-06-01 DIAGNOSIS — I471 Supraventricular tachycardia: Secondary | ICD-10-CM

## 2022-06-01 DIAGNOSIS — G250 Essential tremor: Secondary | ICD-10-CM

## 2022-06-01 MED ORDER — PROPRANOLOL HCL 40 MG PO TABS: 40.0000 mg | ORAL_TABLET | Freq: Two times a day (BID) | ORAL | 0 refills | Status: DC

## 2022-06-01 MED ORDER — ALPRAZOLAM 0.5 MG PO TABS: 0.5000 mg | ORAL_TABLET | Freq: Every evening | ORAL | 0 refills | Status: DC | PRN

## 2022-06-01 NOTE — Assessment & Plan Note (Signed)
Multiple family members and no other symptoms. Start propranolol 40 mg BID. Update if not improving.

## 2022-06-01 NOTE — Assessment & Plan Note (Signed)
Stable on prn xanax. Reviewed database, last refill #30 08/2021. Will send in #30.

## 2022-06-01 NOTE — Assessment & Plan Note (Signed)
Stop metoprolol 12.5 mg. Start propranolol 40 mg BID. Update if worsening heart rate.

## 2022-06-01 NOTE — Progress Notes (Signed)
   Subjective:     Jenel Gierke is a 71 y.o. female presenting for Tremors (Of the neck, worsened over the last 2 months )     HPI  #Tremor - was previous told she had a benign essential tremor - has noticed that her head is moving more frequently - will occur at rest - also had a slightly nervous hand when under stress - had an aunt with a bad tremor - dad with a bad tremor - of the head - no gain changes - does get better   Review of Systems   Social History   Tobacco Use  Smoking Status Never  Smokeless Tobacco Never        Objective:    BP Readings from Last 3 Encounters:  06/01/22 124/70  03/17/22 136/75  12/21/21 140/64   Wt Readings from Last 3 Encounters:  06/01/22 158 lb (71.7 kg)  03/17/22 158 lb (71.7 kg)  02/04/22 157 lb 9.6 oz (71.5 kg)    BP 124/70   Pulse 62   Temp (!) 97.4 F (36.3 C) (Temporal)   Ht '5\' 7"'$  (1.702 m)   Wt 158 lb (71.7 kg)   SpO2 98%   BMI 24.75 kg/m    Physical Exam Constitutional:      General: She is not in acute distress.    Appearance: She is well-developed. She is not diaphoretic.  HENT:     Right Ear: External ear normal.     Left Ear: External ear normal.     Nose: Nose normal.  Eyes:     Conjunctiva/sclera: Conjunctivae normal.  Cardiovascular:     Rate and Rhythm: Normal rate and regular rhythm.     Heart sounds: No murmur heard. Pulmonary:     Effort: Pulmonary effort is normal. No respiratory distress.     Breath sounds: Normal breath sounds. No wheezing.  Musculoskeletal:     Cervical back: Neck supple.  Skin:    General: Skin is warm and dry.     Capillary Refill: Capillary refill takes less than 2 seconds.  Neurological:     Mental Status: She is alert. Mental status is at baseline.     Comments: Slight tremor of the head and neck  Psychiatric:        Mood and Affect: Mood normal.        Behavior: Behavior normal.          Assessment & Plan:   Problem List Items Addressed This  Visit       Cardiovascular and Mediastinum   Atrial tachycardia (Gross)    Stop metoprolol 12.5 mg. Start propranolol 40 mg BID. Update if worsening heart rate.        Relevant Medications   propranolol (INDERAL) 40 MG tablet     Nervous and Auditory   Essential tremor - Primary    Multiple family members and no other symptoms. Start propranolol 40 mg BID. Update if not improving.          Other   Insomnia    Stable on prn xanax. Reviewed database, last refill #30 08/2021. Will send in #30.        Relevant Medications   ALPRAZolam (XANAX) 0.5 MG tablet     Return if symptoms worsen or fail to improve.  Lesleigh Noe, MD

## 2022-06-01 NOTE — Patient Instructions (Signed)
Stop Metoprolol   Start propranolol 40 mg twice daily  Update if working for tremor and heart rate under control - and can plan for refill   If not working - can increase if tolerated  OR switch to an alternative medication

## 2022-06-06 DIAGNOSIS — H2513 Age-related nuclear cataract, bilateral: Secondary | ICD-10-CM | POA: Diagnosis not present

## 2022-06-09 ENCOUNTER — Encounter: Payer: Self-pay | Admitting: Family Medicine

## 2022-06-09 DIAGNOSIS — G250 Essential tremor: Secondary | ICD-10-CM

## 2022-06-20 ENCOUNTER — Ambulatory Visit
Admission: RE | Admit: 2022-06-20 | Discharge: 2022-06-20 | Disposition: A | Discharge status: Home / Self Care | Payer: PPO | Source: Ambulatory Visit | Attending: Oncology | Admitting: Oncology

## 2022-06-20 DIAGNOSIS — D0512 Intraductal carcinoma in situ of left breast: Secondary | ICD-10-CM | POA: Diagnosis not present

## 2022-06-20 DIAGNOSIS — R922 Inconclusive mammogram: Secondary | ICD-10-CM | POA: Diagnosis not present

## 2022-06-20 DIAGNOSIS — Z853 Personal history of malignant neoplasm of breast: Secondary | ICD-10-CM | POA: Diagnosis not present

## 2022-06-23 ENCOUNTER — Other Ambulatory Visit: Payer: Self-pay | Admitting: Family Medicine

## 2022-06-23 MED ORDER — TOPIRAMATE 25 MG PO TABS: ORAL_TABLET | ORAL | 0 refills | Status: DC

## 2022-06-23 NOTE — Addendum Note (Signed)
Addended by: Waunita Schooner R on: 06/23/2022 12:46 PM   Modules accepted: Orders

## 2022-06-24 ENCOUNTER — Inpatient Hospital Stay: Payer: PPO | Attending: Oncology | Admitting: Oncology

## 2022-06-24 ENCOUNTER — Encounter: Payer: Self-pay | Admitting: Oncology

## 2022-06-24 VITALS — BP 146/82 | HR 57 | Temp 99.0°F | Ht 67.0 in | Wt 157.6 lb

## 2022-06-24 DIAGNOSIS — Z08 Encounter for follow-up examination after completed treatment for malignant neoplasm: Secondary | ICD-10-CM | POA: Diagnosis not present

## 2022-06-24 DIAGNOSIS — Z5181 Encounter for therapeutic drug level monitoring: Secondary | ICD-10-CM

## 2022-06-24 DIAGNOSIS — Z86000 Personal history of in-situ neoplasm of breast: Secondary | ICD-10-CM | POA: Diagnosis not present

## 2022-06-24 DIAGNOSIS — Z79810 Long term (current) use of selective estrogen receptor modulators (SERMs): Secondary | ICD-10-CM

## 2022-06-24 DIAGNOSIS — D0512 Intraductal carcinoma in situ of left breast: Secondary | ICD-10-CM | POA: Insufficient documentation

## 2022-06-24 DIAGNOSIS — Z170 Estrogen receptor positive status [ER+]: Secondary | ICD-10-CM | POA: Diagnosis not present

## 2022-06-24 DIAGNOSIS — M8580 Other specified disorders of bone density and structure, unspecified site: Secondary | ICD-10-CM | POA: Diagnosis not present

## 2022-06-24 NOTE — Progress Notes (Signed)
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  2 adjacent groups of heterogeneous calcifications spanning 1.1 x 2 x 2.4 cm.  She underwent lumpectomy in July 2019.  Pathology showed 2.8 cm grade 2-3 DCIS with sclerosing adenosis.  ER greater than 90% positive and PR negative.  She then underwent adjuvant radiation treatment and began tamoxifen in September 2019.  Patient has baseline osteopenia with a T score of -1.8 in the left femoral neck.    Interval history-tolerating tamoxifen well without any significant side effects.  Reports no breast concerns  ECOG PS- 0 Pain scale- 0   Review of systems- Review of Systems  Constitutional:  Negative for chills, fever, malaise/fatigue and weight loss.  HENT:  Negative for congestion, ear discharge and nosebleeds.   Eyes:  Negative for blurred vision.  Respiratory:  Negative for cough, hemoptysis, sputum production, shortness of breath and wheezing.   Cardiovascular:  Negative for chest pain, palpitations, orthopnea and claudication.  Gastrointestinal:  Negative for abdominal pain, blood in stool, constipation, diarrhea, heartburn, melena, nausea and vomiting.  Genitourinary:  Negative for dysuria, flank pain, frequency, hematuria and urgency.  Musculoskeletal:  Negative for back pain, joint pain and  myalgias.  Skin:  Negative for rash.  Neurological:  Negative for dizziness, tingling, focal weakness, seizures, weakness and headaches.  Endo/Heme/Allergies:  Does not bruise/bleed easily.  Psychiatric/Behavioral:  Negative for depression and suicidal ideas. The patient does not have insomnia.       No Known Allergies   Past Medical History:  Diagnosis Date   Arthritis    Basal cell carcinoma 05/26/2016   R chest parasternal   Basal cell carcinoma 01/21/2015   L nose supratip    Breast cancer (HCC)    Left DCIS   Cancer (HCC)    Complication of anesthesia    Dysrhythmia    PSVT on metoprolol   Family history of breast cancer    Gastric reflux 90's   GERD (gastroesophageal reflux disease)    Headache    H/O MIGRAINES   Hiatal hernia 2003   History of Coronary CT for Calcium Scoring    a. 11/2018 CT Cor Ca2+ = Zero.   Hypertension    Mitral regurgitation    a. 03/2017 Echo: EF 55-60%, no nrwma, Mod MR; b. 03/2018 Echo: EF 60-65%, no rwma. Gr2 DD. Mild MV prolapse of ant leaflet w/ mild to mod mR. Nl RV fxn. Mild to mod TR.   Personal history of radiation therapy 2019   LEFT lumpectomy   PONV (postoperative nausea and vomiting)    N/V   PSVT (paroxysmal supraventricular tachycardia) (HCC)    a. 03/2017 Event Monitor: Avg HR 66, 10 episodes of SVT up to 42.8 secs, fastest 148 bpm. 8 beats WCT (179 bpm) - VT vs SVT w/ aberrancy. Rare PACs (2.6% total beats).   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   R prox  lat bicep     Past Surgical History:  Procedure Laterality Date   AUGMENTATION MAMMAPLASTY Bilateral 01/17/2020   BREAST BIOPSY Left 06/13/2018   Affirm Bx- coil clip   atypical sclerosing and DCIS   BREAST ENHANCEMENT SURGERY Bilateral 01/17/2020   Procedure: MAMMOPLASTY AUGMENTATION WITH PROSTHESIS (BREAST);  Surgeon: Cindra Presume, MD;  Location: [place];  Service: Plastics;  Laterality: Bilateral;  2.5 hours for total case   BREAST  EXCISIONAL BIOPSY Right 2000   benign   BREAST LUMPECTOMY Left 06/26/2018   atypical sclerosing lesion and DCIS bracketing   BREAST LUMPECTOMY WITH NEEDLE LOCALIZATION Left 06/26/2018   Procedure: BREAST LUMPECTOMY WITH NEEDLE LOCALIZATION;  Surgeon: Jules Husbands, MD;  Location: [facility];  Service: General;  Laterality: Left;   CHOLECYSTECTOMY     OVARIAN CYST SURGERY     SCAR REVISION Left 01/17/2020   Procedure: POSSIBLE SCAR REVISION TO LEFT BREAST;  Surgeon: Cindra Presume, MD;  Location: [place];  Service: Plastics;  Laterality: Left;   SKIN FULL THICKNESS GRAFT  01/17/2020   Procedure: SKIN GRAFT FULL THICKNESS;  Surgeon: Cindra Presume, MD;  Location: [place];  Service: Plastics;;   TONSILLECTOMY     age 82   UPPER GI ENDOSCOPY  2003    Social History   Socioeconomic History   Marital status: Married    Spouse name: Richard   Number of children: 2   Years of education: College   Highest education level: Not on file  Occupational History   Not on file  Tobacco Use   Smoking status: Never   Smokeless tobacco: Never  Vaping Use   Vaping Use: Never used  Substance and Sexual Activity   Alcohol use: Yes    Alcohol/week: 2.0 standard drinks of alcohol    Types: 2 Glasses of wine per week    Comment: daily WINE   Drug use: No   Sexual activity: Not Currently    Birth control/protection: Post-menopausal  Other Topics Concern   Not on file  Social History Narrative   03/30/20   From: the area, [place]: with husband Delfino Lovett [phone])   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      Family: 2 step children - Buyer, retail and [place] - 3 grandchildren, good relationships      Enjoys: garden, flowers      Exercise: walking, walking the dog 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) - daily, average 6 mile/day   Diet: fairly good       Safety   Seat belts: Yes    Guns: Yes  and secure   Safe in relationships: Yes    Social Determinants of Health    Financial Resource Strain: Low Risk  (01/27/2022)   Overall Financial Resource Strain (CARDIA)    Difficulty of Paying Living Expenses: Not hard at all  Food Insecurity: No Food Insecurity (01/27/2022)   Hunger Vital Sign    Worried About Running Out of Food in the Last Year: Never true    [place] in the Last Year: Never true  Transportation Needs: No Transportation Needs (01/27/2022)   PRAPARE - Hydrologist (Medical): No    Lack of Transportation (Non-Medical): No  Physical Activity: Sufficiently Active (01/27/2022)   Exercise Vital Sign    Days of Exercise per Week: 6 days    Minutes of Exercise per Session: 60 min  Stress: No Stress Concern Present (01/27/2022)  [employer] - Occupational Stress Questionnaire    Feeling of Stress : Not at all  Social Connections: Moderately Isolated (01/27/2022)   Social Connection and Isolation Panel [NHANES]    Frequency of Communication with Friends and Family: More than three times a week    Frequency of Social Gatherings with Friends and Family: More than three times a week    Attends Religious Services: Never    Marine scientist or Organizations: No    Attends Archivist Meetings: Never    Marital Status: Married  Human resources officer Violence: Not At Risk (01/27/2022)   Humiliation, Afraid, Rape, and Kick questionnaire    Fear of Current or Ex-Partner: No    Emotionally Abused: No    Physically Abused: No    Sexually Abused: No    Family History  Problem Relation Age of Onset   Hyperlipidemia Mother    Hypertension Mother    COPD Mother    Heart failure Mother        deceased 61   Stroke Father 62   Hypertension Father    Hypertension Sister    Hypertension Brother    Healthy Brother    Breast cancer Cousin 42       daughter of paternal aunt; currently 64   Breast cancer Cousin 40       daughter of paternal uncle; currently 54   Breast cancer Cousin 42        daughter of maternal uncle; deceased 65   Testicular cancer Other 62       sister's son; currently 62   Melanoma Paternal Uncle    Breast cancer Cousin 86       daughter of same maternal uncle; currently 4     Current Outpatient Medications:    acetaminophen (TYLENOL) 500 MG tablet, Take 1,000 mg by mouth every 6 (six) hours as needed., Disp: , Rfl:    ALPRAZolam (XANAX) 0.5 MG tablet, Take 1 tablet (0.5 mg total) by mouth at bedtime as needed. for sleep, Disp: 30 tablet, Rfl: 0   calcium citrate-vitamin D 500-500 MG-UNIT chewable tablet, Chew 1 tablet by mouth 2 (two) times daily., Disp: , Rfl:    cholecalciferol (VITAMIN D) 1000 units tablet, Take 1,000 Units by mouth daily., Disp: , Rfl:    famotidine (PEPCID) 20 MG tablet, Take 20 mg by mouth as needed for heartburn or indigestion., Disp: , Rfl:    fluticasone (FLONASE) 50 MCG/ACT nasal spray, Place 2 sprays into both nostrils as needed. , Disp: , Rfl:    Multiple Vitamin (MULTIVITAMIN) tablet, Take 1 tablet by mouth daily., Disp: , Rfl:    NAPROXEN PO, Take 1 tablet by mouth every 12 (twelve) hours as needed (pain)., Disp: , Rfl:    propranolol (INDERAL) 40 MG tablet, Take 1 tablet (40 mg total) by mouth 2 (two) times daily., Disp: 60 tablet, Rfl: 0   psyllium (REGULOID) 0.52 g capsule, Take 0.52 g by mouth 3 (three) times daily. , Disp: , Rfl:    SIMETHICONE-80 PO, Take 80 mg by mouth as needed (gas). , Disp: , Rfl:    tamoxifen (NOLVADEX) 20 MG tablet, Take 1 tablet (20 mg total) by mouth daily., Disp: 90 tablet, Rfl: 3   topiramate (TOPAMAX) 25 MG tablet, Take 1 tablet (25 mg total) by mouth at bedtime for 7 days, THEN 1 tablet (25 mg total) 2 (two) times daily for 21 days., Disp: 49 tablet, Rfl: 0  Physical  exam:  Vitals:   06/24/22 1124  BP: (!) 146/82  Pulse: (!) 57  Temp: 99 F (37.2 C)  TempSrc: Tympanic  SpO2: 100%  Weight: 157 lb 9.6 oz (71.5 kg)  Height: '5\' 7"'$  (1.702 m)   Physical Exam Cardiovascular:      Rate and Rhythm: Normal rate and regular rhythm.     Heart sounds: Normal heart sounds.  Pulmonary:     Effort: Pulmonary effort is normal.     Breath sounds: Normal breath sounds.  Skin:    General: Skin is warm and dry.  Neurological:     Mental Status: She is alert and oriented to person, place, and time.   Breast exam was performed in seated and lying down position. Patient is status post left lumpectomy with a well-healed surgical scar. No evidence of any palpable masses. No evidence of axillary adenopathy. No evidence of any palpable masses or lumps in the right breast. No evidence of right axillary adenopathy      Latest Ref Rng & Units 06/21/2021   10:57 AM  CMP  Glucose 70 - 99 mg/dL 95   BUN 8 - 23 mg/dL 20   Creatinine 0.44 - 1.00 mg/dL 0.79   Sodium 135 - 145 mmol/L 136   Potassium 3.5 - 5.1 mmol/L 4.5   Chloride 98 - 111 mmol/L 101   CO2 22 - 32 mmol/L 28   Calcium 8.9 - 10.3 mg/dL 9.2   Total Protein 6.5 - 8.1 g/dL 6.4   Total Bilirubin 0.3 - 1.2 mg/dL 1.1   Alkaline Phos 38 - 126 U/L 39   AST 15 - 41 U/L 22   ALT 0 - 44 U/L 20       Latest Ref Rng & Units 06/21/2021   10:57 AM  CBC  WBC 4.0 - 10.5 K/uL 6.3   Hemoglobin 12.0 - 15.0 g/dL 14.2   Hematocrit 36.0 - 46.0 % 41.7   Platelets 150 - 400 K/uL 231     No images are attached to the encounter.  MM DIAG BREAST W/IMPLANT TOMO BILATERAL  Result Date: 06/20/2022 CLINICAL DATA:  71 year old female status post malignant left lumpectomy 2019. EXAM: DIGITAL DIAGNOSTIC BILATERAL MAMMOGRAM WITH IMPLANTS, CAD AND TOMOSYNTHESIS TECHNIQUE: Bilateral digital diagnostic mammography and breast tomosynthesis was performed. The images were evaluated with computer-aided detection. Standard and/or implant displaced views were performed. COMPARISON:  Previous exam(s). ACR Breast Density Category c: The breast tissue is heterogeneously dense, which may obscure small masses. FINDINGS: Stable post lumpectomy changes within the  left breast. Otherwise, no new or suspicious findings in either breast. The parenchymal pattern is stable. The patient has retropectoral implants. IMPRESSION: 1. No mammographic evidence of malignancy in either breast. 2. Stable left breast posttreatment changes. RECOMMENDATION: Per protocol, as the patient is now 2 or more years status post lumpectomy, she may return to annual screening mammography in 1 year. However, given the history of breast cancer, the patient remains eligible for annual diagnostic mammography if preferred. (Code:SM-B-01Y) I have discussed the findings and recommendations with the patient. If applicable, a reminder letter will be sent to the patient regarding the next appointment. BI-RADS CATEGORY  2: Benign. Electronically Signed   By: Kristopher Oppenheim M.D.   On: 06/20/2022 09:57     Assessment and plan- Patient is a 71 y.o. female history of left breast DCIS on tamoxifen here for a routine follow-up  Clinically patient is doing well with no concerning signs and symptoms of recurrence based  on today's exam.  She recently had a mammogram in June 2023 which was unremarkable.  Patient will continue to take tamoxifen for 5 years ending in September 2024.  I will see her back in 6 months no labs.  Her bone density scan in September 2022 showed osteopenia with a T score of -1.8 at the left hip.  However her 10-year probability of a major osteoporotic fracture was less than 20% and hip fracture was less than 3%.  Patient therefore does not require any bisphosphonates in addition to calcium and vitamin D at this time.   Visit Diagnosis 1. Encounter for follow-up surveillance of ductal carcinoma in situ (DCIS) of breast   2. Encounter for monitoring tamoxifen therapy      Dr. Randa Evens, MD, MPH [facility] [phone] 06/24/2022 11:29 AM

## 2022-07-13 ENCOUNTER — Other Ambulatory Visit: Payer: Self-pay | Admitting: Family Medicine

## 2022-07-13 DIAGNOSIS — G250 Essential tremor: Secondary | ICD-10-CM

## 2022-07-18 MED ORDER — TOPIRAMATE 25 MG PO TABS: 25.0000 mg | ORAL_TABLET | Freq: Two times a day (BID) | ORAL | 0 refills | Status: DC

## 2022-07-18 NOTE — Addendum Note (Signed)
Addended by: Lesleigh Noe on: 07/18/2022 02:01 PM   Modules accepted: Orders

## 2022-08-10 ENCOUNTER — Other Ambulatory Visit: Payer: Self-pay | Admitting: Family Medicine

## 2022-08-10 DIAGNOSIS — G250 Essential tremor: Secondary | ICD-10-CM

## 2022-08-11 NOTE — Telephone Encounter (Signed)
Patient has been scheduled

## 2022-08-12 ENCOUNTER — Ambulatory Visit (INDEPENDENT_AMBULATORY_CARE_PROVIDER_SITE_OTHER): Payer: PPO | Admitting: Family Medicine

## 2022-08-12 ENCOUNTER — Encounter: Payer: Self-pay | Admitting: Family Medicine

## 2022-08-12 VITALS — BP 120/80 | HR 61 | Temp 97.0°F | Ht 67.0 in | Wt 156.1 lb

## 2022-08-12 DIAGNOSIS — G250 Essential tremor: Secondary | ICD-10-CM | POA: Diagnosis not present

## 2022-08-12 DIAGNOSIS — G4700 Insomnia, unspecified: Secondary | ICD-10-CM | POA: Diagnosis not present

## 2022-08-12 DIAGNOSIS — I10 Essential (primary) hypertension: Secondary | ICD-10-CM

## 2022-08-12 DIAGNOSIS — I471 Supraventricular tachycardia: Secondary | ICD-10-CM

## 2022-08-12 DIAGNOSIS — E782 Mixed hyperlipidemia: Secondary | ICD-10-CM | POA: Insufficient documentation

## 2022-08-12 LAB — COMPREHENSIVE METABOLIC PANEL
ALT: 18 U/L (ref 0–35)
AST: 25 U/L (ref 0–37)
Albumin: 4.2 g/dL (ref 3.5–5.2)
Alkaline Phosphatase: 46 U/L (ref 39–117)
BUN: 18 mg/dL (ref 6–23)
CO2: 26 mEq/L (ref 19–32)
Calcium: 9 mg/dL (ref 8.4–10.5)
Chloride: 105 mEq/L (ref 96–112)
Creatinine, Ser: 0.83 mg/dL (ref 0.40–1.20)
GFR: 70.62 mL/min (ref >60.00)
Glucose, Bld: 92 mg/dL (ref 70–99)
Potassium: 4.5 mEq/L (ref 3.5–5.1)
Sodium: 139 mEq/L (ref 135–145)
Total Bilirubin: 0.5 mg/dL (ref 0.2–1.2)
Total Protein: 6.8 g/dL (ref 6.0–8.3)

## 2022-08-12 LAB — CBC
HCT: 44.4 % (ref 36.0–46.0)
Hemoglobin: 14.4 g/dL (ref 12.0–15.0)
MCHC: 32.4 g/dL (ref 30.0–36.0)
MCV: 91.9 fl (ref 78.0–100.0)
Platelets: 273 10*3/uL (ref 150.0–400.0)
RBC: 4.83 Mil/uL (ref 3.87–5.11)
RDW: 13.7 % (ref 11.5–15.5)
WBC: 5.6 10*3/uL (ref 4.0–10.5)

## 2022-08-12 LAB — LIPID PANEL
Cholesterol: 164 mg/dL (ref 0–200)
HDL: 54.2 mg/dL (ref >39.00)
LDL Cholesterol: 87 mg/dL (ref 0–99)
NonHDL: 109.96
Total CHOL/HDL Ratio: 3
Triglycerides: 113 mg/dL (ref 0.0–149.0)
VLDL: 22.6 mg/dL (ref 0.0–40.0)

## 2022-08-12 MED ORDER — TOPIRAMATE 25 MG PO TABS: 25.0000 mg | ORAL_TABLET | Freq: Two times a day (BID) | ORAL | 1 refills | Status: DC

## 2022-08-12 NOTE — Assessment & Plan Note (Signed)
Blood pressure controlled, continue metoprolol 12.5 mg daily.

## 2022-08-12 NOTE — Progress Notes (Signed)
Subjective:     Susan [name] is a 71 y.o. female presenting for Follow-up (To tremors and medication )     HPI  #Tremors - still has some tremors - can tell at the 12 hour time  - does feel it is helping - has noticed a metallic taste, some balance issues  - has noticed she will get very dizzy if she takes topiramate with the xanax at nighttime for insomnia - is occasionally having some trouble sleeping since she cannot take her xanax  Review of Systems   Social History   Tobacco Use  Smoking Status Never  Smokeless Tobacco Never        Objective:    BP Readings from Last 3 Encounters:  08/12/22 120/80  06/24/22 (!) 146/82  06/01/22 124/70   Wt Readings from Last 3 Encounters:  08/12/22 156 lb 2 oz (70.8 kg)  06/24/22 157 lb 9.6 oz (71.5 kg)  06/01/22 158 lb (71.7 kg)    BP 120/80   Pulse 61   Temp (!) 97 F (36.1 C) (Temporal)   Ht '5\' 7"'$  (1.702 m)   Wt 156 lb 2 oz (70.8 kg)   SpO2 98%   BMI 24.45 kg/m    Physical Exam Constitutional:      General: She is not in acute distress.    Appearance: She is well-developed. She is not diaphoretic.  HENT:     Right Ear: External ear normal.     Left Ear: External ear normal.     Nose: Nose normal.  Eyes:     Conjunctiva/sclera: Conjunctivae normal.  Cardiovascular:     Rate and Rhythm: Normal rate and regular rhythm.     Heart sounds: No murmur heard. Pulmonary:     Effort: Pulmonary effort is normal. No respiratory distress.     Breath sounds: Normal breath sounds. No wheezing.  Musculoskeletal:     Cervical back: Neck supple.  Skin:    General: Skin is warm and dry.     Capillary Refill: Capillary refill takes less than 2 seconds.  Neurological:     Mental Status: She is alert. Mental status is at baseline.  Psychiatric:        Mood and Affect: Mood normal.        Behavior: Behavior normal.           Assessment & Plan:   Problem List Items Addressed This Visit        Cardiovascular and Mediastinum   Essential hypertension - Primary    Blood pressure controlled, continue metoprolol 12.5 mg daily.      Relevant Medications   metoprolol succinate (TOPROL-XL) 25 MG 24 hr tablet   Other Relevant Orders   Comprehensive metabolic panel   CBC   Atrial tachycardia (HCC)    Heart rate controlled continue metoprolol 12.5 mg daily.      Relevant Medications   metoprolol succinate (TOPROL-XL) 25 MG 24 hr tablet     Nervous and Auditory   Essential tremor    Sniffing and improvement on topiramate 25 mg twice daily.  She does note some side effects metallic taste and some balance issues.  At this point she will continue the medication, but discussed tapering if side effects are outweighing benefits.  Since she is already failed propranolol and has a drug interaction with her tamoxifen discussed if unable to continue topiramate will recommend neurology referral for additional tremor management.      Relevant Medications   topiramate (  TOPAMAX) 25 MG tablet     Other   Insomnia    Intermittent, but with topiramate she has been unable to tolerate her Xanax.  Discussed trying 0.125 mg or a quarter of a pill.  She is going to retry, she will update if still unable to tolerate and if insomnia is becoming more troublesome.      Mixed hyperlipidemia    Recheck today. She is not fasting.        Relevant Medications   metoprolol succinate (TOPROL-XL) 25 MG 24 hr tablet   Other Relevant Orders   Lipid panel     Return in about 6 months (around 02/12/2023) for for transfer of care.  Lesleigh Noe, MD

## 2022-08-12 NOTE — Assessment & Plan Note (Signed)
Recheck today. She is not fasting.

## 2022-08-12 NOTE — Patient Instructions (Signed)
For stopping - if you choose - decrease to 1 pill daily - then if no new or worsening symptoms after 1 week stop  Xanax - update if you want to try 0.25 mg pill to cut in half

## 2022-08-12 NOTE — Assessment & Plan Note (Signed)
Intermittent, but with topiramate she has been unable to tolerate her Xanax.  Discussed trying 0.125 mg or a quarter of a pill.  She is going to retry, she will update if still unable to tolerate and if insomnia is becoming more troublesome.

## 2022-08-12 NOTE — Assessment & Plan Note (Signed)
Sniffing and improvement on topiramate 25 mg twice daily.  She does note some side effects metallic taste and some balance issues.  At this point she will continue the medication, but discussed tapering if side effects are outweighing benefits.  Since she is already failed propranolol and has a drug interaction with her tamoxifen discussed if unable to continue topiramate will recommend neurology referral for additional tremor management.

## 2022-08-12 NOTE — Assessment & Plan Note (Signed)
Heart rate controlled continue metoprolol 12.5 mg daily.

## 2022-08-18 ENCOUNTER — Other Ambulatory Visit: Payer: Self-pay | Admitting: Oncology

## 2022-08-18 DIAGNOSIS — D0512 Intraductal carcinoma in situ of left breast: Secondary | ICD-10-CM

## 2022-08-23 ENCOUNTER — Other Ambulatory Visit (HOSPITAL_COMMUNITY)
Admission: RE | Admit: 2022-08-23 | Discharge: 2022-08-23 | Disposition: A | Discharge status: Home / Self Care | Payer: PPO | Source: Ambulatory Visit | Attending: Obstetrics & Gynecology | Admitting: Obstetrics & Gynecology

## 2022-08-23 ENCOUNTER — Ambulatory Visit: Payer: PPO | Admitting: Obstetrics & Gynecology

## 2022-08-23 ENCOUNTER — Encounter: Payer: Self-pay | Admitting: Obstetrics & Gynecology

## 2022-08-23 VITALS — BP 138/76 | HR 69 | Wt 157.0 lb

## 2022-08-23 DIAGNOSIS — N9089 Other specified noninflammatory disorders of vulva and perineum: Secondary | ICD-10-CM

## 2022-08-23 DIAGNOSIS — N904 Leukoplakia of vulva: Secondary | ICD-10-CM | POA: Diagnosis not present

## 2022-08-23 DIAGNOSIS — K645 Perianal venous thrombosis: Secondary | ICD-10-CM

## 2022-08-23 DIAGNOSIS — L900 Lichen sclerosus et atrophicus: Secondary | ICD-10-CM | POA: Insufficient documentation

## 2022-08-23 NOTE — Progress Notes (Signed)
GYNECOLOGY OFFICE VISIT NOTE  History:   Susan [name] is a 71 y.o.  PMP F here today for evaluation of vulvar irritation for last 3 months.  Reports distant history of lichen sclerosus, has been treated with clobetasol in the past.  Also reports history of biopsy in the perineal area.  Patient reports some clear vaginal discharge, no odor.  Also noted an abnormal looking hemorrhoid that is causing some irritation, wants to discuss treatment of this.  She denies any abnormal vaginal  bleeding, pelvic pain or other concerns. Of note, patient is on tamoxifen for treatment of breast cancer.    Past Medical History:  Diagnosis Date   Arthritis    Basal cell carcinoma 05/26/2016   R chest parasternal   Basal cell carcinoma 01/21/2015   L nose supratip    Breast cancer (HCC)    Left DCIS   Cancer (HCC)    Complication of anesthesia    Dysrhythmia    PSVT on metoprolol   Family history of breast cancer    Gastric reflux 90's   GERD (gastroesophageal reflux disease)    Headache    H/O MIGRAINES   Hiatal hernia 2003   History of Coronary CT for Calcium Scoring    a. 11/2018 CT Cor Ca2+ = Zero.   Hypertension    Mitral regurgitation    a. 03/2017 Echo: EF 55-60%, no nrwma, Mod MR; b. 03/2018 Echo: EF 60-65%, no rwma. Gr2 DD. Mild MV prolapse of ant leaflet w/ mild to mod mR. Nl RV fxn. Mild to mod TR.   Personal history of radiation therapy 2019   LEFT lumpectomy   PONV (postoperative nausea and vomiting)    N/V   PSVT (paroxysmal supraventricular tachycardia) (HCC)    a. 03/2017 Event Monitor: Avg HR 66, 10 episodes of SVT up to 42.8 secs, fastest 148 bpm. 8 beats WCT (179 bpm) - VT vs SVT w/ aberrancy. Rare PACs (2.6% total beats).   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   R prox lat bicep    Past Surgical History:  Procedure Laterality Date   AUGMENTATION MAMMAPLASTY Bilateral 01/17/2020   BREAST BIOPSY Left 06/13/2018   Affirm Bx- coil clip   atypical sclerosing and DCIS    BREAST ENHANCEMENT SURGERY Bilateral 01/17/2020   Procedure: MAMMOPLASTY AUGMENTATION WITH PROSTHESIS (BREAST);  Surgeon: Cindra Presume, MD;  Location: [facility];  Service: Plastics;  Laterality: Bilateral;  2.5 hours for total case   BREAST EXCISIONAL BIOPSY Right 2000   benign   BREAST LUMPECTOMY Left 06/26/2018   atypical sclerosing lesion and DCIS bracketing   BREAST LUMPECTOMY WITH NEEDLE LOCALIZATION Left 06/26/2018   Procedure: BREAST LUMPECTOMY WITH NEEDLE LOCALIZATION;  Surgeon: Jules Husbands, MD;  Location: [facility];  Service: General;  Laterality: Left;   CHOLECYSTECTOMY     OVARIAN CYST SURGERY     SCAR REVISION Left 01/17/2020   Procedure: POSSIBLE SCAR REVISION TO LEFT BREAST;  Surgeon: Cindra Presume, MD;  Location: [place] [facility];  Service: Plastics;  Laterality: Left;   SKIN FULL THICKNESS GRAFT  01/17/2020   Procedure: SKIN GRAFT FULL THICKNESS;  Surgeon: Cindra Presume, MD;  Location: [facility];  Service: Plastics;;   TONSILLECTOMY     age 46   UPPER GI ENDOSCOPY  2003    The following portions of the patient's history were reviewed and updated as appropriate: allergies, current medications, past family history, past medical history, past social  history, past surgical history and problem list.   Review of Systems:  Pertinent items noted in HPI and remainder of comprehensive ROS otherwise negative.  Physical Exam:  BP 138/76   Pulse 69   Wt 157 lb (71.2 kg)   BMI 24.59 kg/m  CONSTITUTIONAL: Well-developed, well-nourished female in no acute distress.  HEENT:  Normocephalic, atraumatic. External right and left ear normal. No scleral icterus.  NECK: Normal range of motion, supple, no masses noted on observation SKIN: No rash noted. Not diaphoretic. No erythema. No pallor. MUSCULOSKELETAL: Normal range of motion. No edema noted. NEUROLOGIC: Alert and oriented to person, place, and time. Normal muscle tone coordination. No  cranial nerve deficit noted. PSYCHIATRIC: Normal mood and affect. Normal behavior. Normal judgment and thought content. CARDIOVASCULAR: Normal heart rate noted RESPIRATORY: Effort and breath sounds normal, no problems with respiration noted ABDOMEN: No masses noted. No other overt distention noted.   PELVIC:  Loss of architecture noted diffusely, significant atrophy of bilateral labia.  Scant clear discharge seen, testing sample obtained. Fissures seen in posterior fourchette, tender to touch.  1 cm thrombosed external hemorrhoid noted, mild tenderness on palpation, no erythema seen . See pictures below   VULVAR BIOPSY NOTE The indications for vulvar biopsy (rule out neoplasia, establish lichen sclerosus diagnosis) were reviewed.   Risks of the biopsy including pain, bleeding, infection, inadequate specimen, scarring and need for additional procedures  were discussed. The patient stated understanding and agreed to undergo procedure today. Consent was signed,  time out performed.  Chaperone was present during entire procedure. The patient's vulva was prepped with Betadine. 1% lidocaine was injected into area of posterior fourchette. A 4-mm punch biopsy was done at the area of the fissures of the posterior fourchette, biopsy tissue was picked up with sterile forceps and sterile scissors were used to excise the lesion.  Small bleeding was noted and hemostasis was achieved using silver nitrate sticks and one interrupted 4-0 Vicryl suture.  The patient tolerated the procedure well.  Post-procedure instructions  (pelvic rest for one week) were given to the patient. The patient is to call with heavy bleeding, fever greater than 100.4, foul smelling vaginal discharge or other concerns. The patient will be contacted with results.     Assessment and Plan:     1. Vulvar irritation and discharge Testing sample obtained, will follow up results and manage accordingly. - Cervicovaginal ancillary only( [facility])  2. Lichen sclerosus et atrophicus 3. Lesion of vulva Biopsy done, will follow up results and manage accordingly. Will await results prior to treatment for now. - Surgical pathology  4. External hemorrhoid, thrombosed Conservative therapies recommended for now.  Given her symptoms, also referred to General Surgery for further evaluation. - Ambulatory referral to General Surgery  Routine preventative health maintenance measures emphasized. Please refer to After Visit Summary for other counseling recommendations.   Return for any gynecologic concerns.    I spent 30 minutes dedicated to the care of this patient including pre-visit review of records, face to face time with the patient discussing her conditions and treatments and post visit orders.    Verita Schneiders, MD, [place] for [employer], [place]

## 2022-08-24 ENCOUNTER — Encounter: Payer: Self-pay | Admitting: Obstetrics & Gynecology

## 2022-08-24 LAB — CERVICOVAGINAL ANCILLARY ONLY
Bacterial Vaginitis (gardnerella): NEGATIVE
Candida Glabrata: NEGATIVE
Candida Vaginitis: NEGATIVE
Comment: NEGATIVE
Comment: NEGATIVE
Comment: NEGATIVE

## 2022-08-24 MED ORDER — CLOBETASOL PROPIONATE 0.05 % EX OINT: TOPICAL_OINTMENT | CUTANEOUS | 5 refills | Status: DC

## 2022-08-24 NOTE — Addendum Note (Signed)
Addended by: Verita Schneiders A on: 08/24/2022 01:53 PM   Modules accepted: Orders

## 2022-08-25 LAB — SURGICAL PATHOLOGY

## 2022-08-26 ENCOUNTER — Telehealth: Payer: Self-pay | Admitting: *Deleted

## 2022-08-26 NOTE — Telephone Encounter (Signed)
Called pt to inform her of results and recommendations. Follow up made.

## 2022-08-26 NOTE — Telephone Encounter (Signed)
-----   Message from Osborne Oman, MD sent at 08/25/2022  3:01 PM EDT ----- Skin was not identified well, even though a 4 mm punch biopsy was taken. But no abnormal cells seen. Will evaluate response to Clobetasol. If not improving, may need to repeat skin biopsy for further information.  Please call to inform patient of results and recommendations.  Patient needs follow up with me (or Dr. Kennon Rounds) in 4-6 weeks.   Thank you.   Verita Schneiders, MD

## 2022-09-08 ENCOUNTER — Ambulatory Visit: Payer: PPO | Admitting: Dermatology

## 2022-09-08 ENCOUNTER — Encounter: Payer: Self-pay | Admitting: Dermatology

## 2022-09-08 DIAGNOSIS — Z808 Family history of malignant neoplasm of other organs or systems: Secondary | ICD-10-CM | POA: Diagnosis not present

## 2022-09-08 DIAGNOSIS — Z872 Personal history of diseases of the skin and subcutaneous tissue: Secondary | ICD-10-CM | POA: Diagnosis not present

## 2022-09-08 DIAGNOSIS — Z79899 Other long term (current) drug therapy: Secondary | ICD-10-CM

## 2022-09-08 DIAGNOSIS — D1801 Hemangioma of skin and subcutaneous tissue: Secondary | ICD-10-CM | POA: Diagnosis not present

## 2022-09-08 DIAGNOSIS — L719 Rosacea, unspecified: Secondary | ICD-10-CM | POA: Diagnosis not present

## 2022-09-08 DIAGNOSIS — L820 Inflamed seborrheic keratosis: Secondary | ICD-10-CM

## 2022-09-08 DIAGNOSIS — L578 Other skin changes due to chronic exposure to nonionizing radiation: Secondary | ICD-10-CM

## 2022-09-08 DIAGNOSIS — Z853 Personal history of malignant neoplasm of breast: Secondary | ICD-10-CM | POA: Diagnosis not present

## 2022-09-08 DIAGNOSIS — Z85828 Personal history of other malignant neoplasm of skin: Secondary | ICD-10-CM | POA: Diagnosis not present

## 2022-09-08 DIAGNOSIS — L821 Other seborrheic keratosis: Secondary | ICD-10-CM | POA: Diagnosis not present

## 2022-09-08 DIAGNOSIS — Z1283 Encounter for screening for malignant neoplasm of skin: Secondary | ICD-10-CM

## 2022-09-08 DIAGNOSIS — L814 Other melanin hyperpigmentation: Secondary | ICD-10-CM

## 2022-09-08 MED ORDER — IVERMECTIN 1 % EX CREA
1.0000 | TOPICAL_CREAM | Freq: Every day | CUTANEOUS | 11 refills | Status: DC
Start: 2022-09-08 — End: 2023-09-13

## 2022-09-08 NOTE — Progress Notes (Unsigned)
Follow-Up Visit   Subjective  Susan [name] is a 71 y.o. female who presents for the following: Total body skin exam (Hx of BCCs, Hx of SCC R prox lat bicep, Hx of AKs), Rosacea (Face, SM Triple cream qhs), and check spot (L upper arm, ~62m irritating). The patient presents for Total-Body Skin Exam (TBSE) for skin cancer screening and mole check.  The patient has spots, moles and lesions to be evaluated, some may be new or changing and the patient has concerns that these could be cancer.   The following portions of the chart were reviewed this encounter and updated as appropriate:   Tobacco  Allergies  Meds  Problems  Med Hx  Surg Hx  Fam Hx     Review of Systems:  No other skin or systemic complaints except as noted in HPI or Assessment and Plan.  Objective  Well appearing patient in no apparent distress; mood and affect are within normal limits.  A full examination was performed including scalp, head, eyes, ears, nose, lips, neck, chest, axillae, abdomen, back, buttocks, bilateral upper extremities, bilateral lower extremities, hands, feet, fingers, toes, fingernails, and toenails. All findings within normal limits unless otherwise noted below.  L upper arm above elbow x 1 Stuck on waxy paps with erythema  Head - Anterior (Face) Erythema and telangiectasias face   Assessment & Plan  Inflamed seborrheic keratosis L upper arm above elbow x 1 Symptomatic, irritating, patient would like treated. Destruction of lesion - L upper arm above elbow x 1 Complexity: simple   Destruction method: cryotherapy   Informed consent: discussed and consent obtained   Timeout:  patient name, date of birth, surgical site, and procedure verified Lesion destroyed using liquid nitrogen: Yes   Region frozen until ice ball extended beyond lesion: Yes   Outcome: patient tolerated procedure well with no complications   Post-procedure details: wound care instructions given    Rosacea Head -  Anterior (Face) Rosacea is a chronic progressive skin condition usually affecting the face of adults, causing redness and/or acne bumps. It is treatable but not curable. It sometimes affects the eyes (ocular rosacea) as well. It may respond to topical and/or systemic medication and can flare with stress, sun exposure, alcohol, exercise and some foods.  Daily application of broad spectrum spf 30+ sunscreen to face is recommended to reduce flares.  Discussed the treatment option of BBL/laser.  Typically we recommend 1-3 treatment sessions about 5-8 weeks apart for best results.  The patient's condition may require "maintenance treatments" in the future.  The fee for BBL / laser treatments is $350 per treatment session for the whole face.  A fee can be quoted for other parts of the body. Insurance typically does not pay for BBL/laser treatments and therefore the fee is an out-of-pocket cost.  Cont SM Triple cream qhs  Ivermectin 1 % CREA - Head - Anterior (Face) Apply 1 Application topically at bedtime.  Lentigines - Scattered tan macules - Due to sun exposure - Benign-appearing, observe - Recommend daily broad spectrum sunscreen SPF 30+ to sun-exposed areas, reapply every 2 hours as needed. - Call for any changes  Seborrheic Keratoses - Stuck-on, waxy, tan-brown papules and/or plaques  - Benign-appearing - Discussed benign etiology and prognosis. - Observe - Call for any changes  Melanocytic Nevi - Tan-brown and/or pink-flesh-colored symmetric macules and papules - Benign appearing on exam today - Observation - Call clinic for new or changing moles - Recommend daily use of broad spectrum  spf 30+ sunscreen to sun-exposed areas.   Hemangiomas - Red papules - Discussed benign nature - Observe - Call for any changes  Actinic Damage - Chronic condition, secondary to cumulative UV/sun exposure - diffuse scaly erythematous macules with underlying dyspigmentation - Recommend daily  broad spectrum sunscreen SPF 30+ to sun-exposed areas, reapply every 2 hours as needed.  - Staying in the shade or wearing long sleeves, sun glasses (UVA+UVB protection) and wide brim hats (4-inch brim around the entire circumference of the hat) are also recommended for sun protection.  - Call for new or changing lesions.  Skin cancer screening performed today.   History of Basal Cell Carcinoma of the Skin - No evidence of recurrence today - Recommend regular full body skin exams - Recommend daily broad spectrum sunscreen SPF 30+ to sun-exposed areas, reapply every 2 hours as needed.  - Call if any new or changing lesions are noted between office visits  - R chest, parasternal, L nose supratip  History of Squamous Cell Carcinoma of the Skin - No evidence of recurrence today - No lymphadenopathy - Recommend regular full body skin exams - Recommend daily broad spectrum sunscreen SPF 30+ to sun-exposed areas, reapply every 2 hours as needed.  - Call if any new or changing lesions are noted between office visits - R prox lat bicep  History of PreCancerous Actinic Keratosis  - site(s) of PreCancerous Actinic Keratosis clear today. - these may recur and new lesions may form requiring treatment to prevent transformation into skin cancer - observe for new or changing spots and contact [facility] for appointment if occur - photoprotection with sun protective clothing; sunglasses and broad spectrum sunscreen with SPF of at least 30 + and frequent self skin exams recommended - yearly exams by a dermatologist recommended for persons with history of PreCancerous Actinic Keratoses   Family history of skin cancer - what type(s): Melanoma - who affected: Paternal Uncle  Hx of Breast Cancer - L breast - No lymphadenopathy  Return in about 1 year (around 09/09/2023) for Hx of BCC, Hx of SCC, Hx of AKs hx of Rosacea.  I, Othelia Pulling, RMA, am acting as scribe for Sarina Ser, MD  . Documentation: I have reviewed the above documentation for accuracy and completeness, and I agree with the above.  Sarina Ser, MD

## 2022-09-08 NOTE — Patient Instructions (Addendum)
Cryotherapy Aftercare  Wash gently with soap and water everyday.   Apply Vaseline and Band-Aid daily until healed.    Instructions for [facility] Medications Rosacea Triple cream One or more of your medications was sent to the [facility] mail order compounding pharmacy. You will receive an email from them and can purchase the medicine through that link. It will then be mailed to your home at the address you confirmed. If for any reason you do not receive an email from them, please check your spam folder. If you still do not find the email, please let us know. [facility] phone number is [phone].       Due to recent changes in healthcare laws, you may see results of your pathology and/or laboratory studies on MyChart before the doctors have had a chance to review them. We understand that in some cases there may be results that are confusing or concerning to you. Please understand that not all results are received at the same time and often the doctors may need to interpret multiple results in order to provide you with the best plan of care or course of treatment. Therefore, we ask that you please give [place] 2 business days to thoroughly review all your results before contacting the office for clarification. Should we see a critical lab result, you will be contacted sooner.   If You Need Anything After Your Visit  If you have any questions or concerns for your doctor, please call our main line at [phone] and press option 4 to reach your doctor's medical assistant. If no one answers, please leave a voicemail as directed and we will return your call as soon as possible. Messages left after 4 pm will be answered the following business day.   You may also send [place] a message via [place]. We typically respond to MyChart messages within 1-2 business days.  For prescription refills, please ask your pharmacy to contact our office. Our fax number is [phone].  If you have an  urgent issue when the clinic is closed that cannot wait until the next business day, you can page your doctor at the number below.    Please note that while we do our best to be available for urgent issues outside of office hours, we are not available 24/7.   If you have an urgent issue and are unable to reach [place], you may choose to seek medical care at your doctor's office, retail clinic, urgent care center, or emergency room.  If you have a medical emergency, please immediately call 911 or go to the emergency department.  Pager Numbers  - Dr. Nehemiah Massed: [phone]  - Dr. Laurence Ferrari: [phone]  - Dr. Nicole Kindred: [phone]  In the event of inclement weather, please call our main line at [phone] for an update on the status of any delays or closures.  Dermatology Medication Tips: Please keep the boxes that topical medications come in in order to help keep track of the instructions about where and how to use these. Pharmacies typically print the medication instructions only on the boxes and not directly on the medication tubes.   If your medication is too expensive, please contact our office at [phone] option 4 or send [place] a message through [place].   We are unable to tell what your co-pay for medications will be in advance as this is different depending on your insurance coverage. However, we may be able to find a substitute medication at lower cost or fill out paperwork  to get insurance to cover a needed medication.   If a prior authorization is required to get your medication covered by your insurance company, please allow [place] 1-2 business days to complete this process.  Drug prices often vary depending on where the prescription is filled and some pharmacies may offer cheaper prices.  The website www.goodrx.com contains coupons for medications through different pharmacies. The prices here do not account for what the cost may be with help from insurance (it may be cheaper with your  insurance), but the website can give you the price if you did not use any insurance.  - You can print the associated coupon and take it with your prescription to the pharmacy.  - You may also stop by our office during regular business hours and pick up a GoodRx coupon card.  - If you need your prescription sent electronically to a different pharmacy, notify our office through [facility] or by phone at [phone] option 4.     Si Usted Necesita Algo Despus de Su Visita  Tambin puede enviarnos un mensaje a travs de Pharmacist, community. Por lo general respondemos a los mensajes de MyChart en el transcurso de 1 a 2 das hbiles.  Para renovar recetas, por favor pida a su farmacia que se ponga en contacto con nuestra oficina. Harland Dingwall de fax es [place] [phone].  Si tiene un asunto urgente cuando la clnica est cerrada y que no puede esperar hasta el siguiente da hbil, puede llamar/localizar a su doctor(a) al nmero que aparece a continuacin.   Por favor, tenga en cuenta que aunque hacemos todo lo posible para estar disponibles para asuntos urgentes fuera del horario de [place], no estamos disponibles las 24 horas del da, los 7 das de la [place].   Si tiene un problema urgente y no puede comunicarse con nosotros, puede optar por buscar atencin mdica  en el consultorio de su doctor(a), en una clnica privada, en un centro de atencin urgente o en una sala de emergencias.  Si tiene Engineering geologist, por favor llame inmediatamente al 911 o vaya a la sala de emergencias.  Nmeros de bper  - Dr. Nehemiah Massed: [phone]  - Dra. Moye: [phone]  - Dra. Nicole Kindred: [phone]  En caso de inclemencias del [place], por favor llame a Johnsie Kindred principal al [phone] para una actualizacin sobre el [place] de cualquier retraso o cierre.  Consejos para la medicacin en dermatologa: Por favor, guarde las cajas en las que vienen los medicamentos de uso tpico para ayudarle a  seguir las instrucciones sobre dnde y cmo usarlos. Las farmacias generalmente imprimen las instrucciones del medicamento slo en las cajas y no directamente en los tubos del [place].   Si su medicamento es muy caro, por favor, pngase en contacto con Zigmund Daniel llamando al [phone] y presione la opcin 4 o envenos un mensaje a travs de Pharmacist, community.   No podemos decirle cul ser su copago por los medicamentos por adelantado ya que esto es diferente dependiendo de la cobertura de su seguro. Sin embargo, es posible que podamos encontrar un medicamento sustituto a Electrical engineer un formulario para que el seguro cubra el medicamento que se considera necesario.   Si se requiere una autorizacin previa para que su compaa de seguros [place] su medicamento, por favor permtanos de 1 a 2 das hbiles para completar este proceso.  Los precios de los medicamentos varan con frecuencia dependiendo del Environmental consultant de dnde se surte la receta y [place]  pueden ofrecer precios ms baratos.  El sitio web www.goodrx.com tiene cupones para medicamentos de Airline pilot. Los precios aqu no tienen en cuenta lo que podra costar con la ayuda del seguro (puede ser ms barato con su seguro), pero el sitio web puede darle el precio si no utiliz Research scientist (physical sciences).  - Puede imprimir el cupn correspondiente y llevarlo con su receta a la farmacia.  - Tambin puede pasar por nuestra oficina durante el horario de atencin regular y Charity fundraiser una tarjeta de cupones de GoodRx.  - Si necesita que su receta se enve electrnicamente a una farmacia diferente, informe a nuestra oficina a travs de MyChart de [place] o por telfono llamando al [phone] y presione la opcin 4.

## 2022-09-11 ENCOUNTER — Encounter: Payer: Self-pay | Admitting: Dermatology

## 2022-09-12 ENCOUNTER — Ambulatory Visit: Payer: PPO | Admitting: Surgery

## 2022-09-12 ENCOUNTER — Other Ambulatory Visit: Payer: Self-pay

## 2022-09-12 ENCOUNTER — Encounter: Payer: Self-pay | Admitting: Surgery

## 2022-09-12 VITALS — BP 139/68 | HR 60 | Temp 98.3°F | Ht 68.0 in | Wt 155.0 lb

## 2022-09-12 DIAGNOSIS — K645 Perianal venous thrombosis: Secondary | ICD-10-CM

## 2022-09-12 NOTE — Patient Instructions (Addendum)
Please call our office if you have any questions or concerns.    Hemorrhoids Hemorrhoids are swollen veins in and around the rectum or anus. There are two types of hemorrhoids: Internal hemorrhoids. These occur in the veins that are just inside the rectum. They may poke through to the outside and become irritated and painful. External hemorrhoids. These occur in the veins that are outside the anus and can be felt as a painful swelling or hard lump near the anus. Most hemorrhoids do not cause serious problems, and they can be managed with home treatments such as diet and lifestyle changes. If home treatments do not help the symptoms, procedures can be done to shrink or remove the hemorrhoids. What are the causes? This condition is caused by increased pressure in the anal area. This pressure may result from various things, including: Constipation. Straining to have a bowel movement. Diarrhea. Pregnancy. Obesity. Sitting for long periods of time. Heavy lifting or other activity that causes you to strain. Anal sex. Riding a bike for a long period of time. What are the signs or symptoms? Symptoms of this condition include: Pain. Anal itching or irritation. Rectal bleeding. Leakage of stool (feces). Anal swelling. One or more lumps around the anus. How is this diagnosed? This condition can often be diagnosed through a visual exam. Other exams or tests may also be done, such as: An exam that involves feeling the rectal area with a gloved hand (digital rectal exam). An exam of the anal canal that is done using a small tube (anoscope). A blood test, if you have lost a significant amount of blood. A test to look inside the colon using a flexible tube with a camera on the end (sigmoidoscopy or colonoscopy). How is this treated? This condition can usually be treated at home. However, various procedures may be done if dietary changes, lifestyle changes, and other home treatments do not help your  symptoms. These procedures can help make the hemorrhoids smaller or remove them completely. Some of these procedures involve surgery, and others do not. Common procedures include: Rubber band ligation. Rubber bands are placed at the base of the hemorrhoids to cut off their blood supply. Sclerotherapy. Medicine is injected into the hemorrhoids to shrink them. Infrared coagulation. A type of light energy is used to get rid of the hemorrhoids. Hemorrhoidectomy surgery. The hemorrhoids are surgically removed, and the veins that supply them are tied off. Stapled hemorrhoidopexy surgery. The surgeon staples the base of the hemorrhoid to the rectal wall. Follow these instructions at home: Eating and drinking  Eat foods that have a lot of fiber in them, such as whole grains, beans, nuts, fruits, and vegetables. Ask your health care provider about taking products that have added fiber (fiber supplements). Reduce the amount of fat in your diet. You can do this by eating low-fat dairy products, eating less red meat, and avoiding processed foods. Drink enough fluid to keep your urine pale yellow. Managing pain and swelling  Take warm sitz baths for 20 minutes, 3-4 times a day to ease pain and discomfort. You may do this in a bathtub or using a portable sitz bath that fits over the toilet. If directed, apply ice to the affected area. Using ice packs between sitz baths may be helpful. Put ice in a plastic bag. Place a towel between your skin and the bag. Leave the ice on for 20 minutes, 2-3 times a day. General instructions Take over-the-counter and prescription medicines only as told by  your health care provider. Use medicated creams or suppositories as told. Get regular exercise. Ask your health care provider how much and what kind of exercise is best for you. In general, you should do moderate exercise for at least 30 minutes on most days of the week (150 minutes each week). This can include activities  such as walking, biking, or yoga. Go to the bathroom when you have the urge to have a bowel movement. Do not wait. Avoid straining to have bowel movements. Keep the anal area dry and clean. Use wet toilet paper or moist towelettes after a bowel movement. Do not sit on the toilet for long periods of time. This increases blood pooling and pain. Keep all follow-up visits as told by your health care provider. This is important. Contact a health care provider if you have: Increasing pain and swelling that are not controlled by treatment or medicine. Difficulty having a bowel movement, or you are unable to have a bowel movement. Pain or inflammation outside the area of the hemorrhoids. Get help right away if you have: Uncontrolled bleeding from your rectum. Summary Hemorrhoids are swollen veins in and around the rectum or anus. Most hemorrhoids can be managed with home treatments such as diet and lifestyle changes. Taking warm sitz baths can help ease pain and discomfort. In severe cases, procedures or surgery can be done to shrink or remove the hemorrhoids. This information is not intended to replace advice given to you by your health care provider. Make sure you discuss any questions you have with your health care provider. Document Revised: 06/23/2021 Document Reviewed: 06/23/2021 Elsevier Patient Education  [place]. How to Take a [employer] A sitz bath is a warm water bath that may be used to care for your rectum, genital area, or the area between your rectum and genitals (perineum). In a sitz bath, the water only comes up to your hips and covers your buttocks. A sitz bath may be done in a bathtub or with a portable sitz bath that fits over the toilet. Your health care provider may recommend a sitz bath to help: Relieve pain and discomfort after delivering a baby. Relieve pain and itching from hemorrhoids or anal fissures. Relieve pain after certain surgeries. Relax muscles that are  sore or tight. How to take a sitz bath Take 2-4 sitz baths a day, or as many as told by your health care provider. Bathtub sitz bath To take a sitz bath in a bathtub: Partially fill a bathtub with warm water. The water should be deep enough to cover your hips and buttocks when you are sitting in the bathtub. Follow your health care provider's instructions if you are told to put medicine in the water. Sit in the water. Open the bathtub drain a little, and leave it open during your bath. Turn on the warm water again, enough to replace the water that is draining out. Keep the water running throughout your bath. This helps keep the water at the right level and temperature. Soak in the water for 15-20 minutes, or as long as told by your health care provider. When you are done, be careful when you stand up. You may feel dizzy. After the sitz bath, pat yourself dry. Do not rub your skin to dry it.  Over-the-toilet sitz bath To take a sitz bath with an over-the-toilet basin: Follow the manufacturer's instructions. Fill the basin with warm water. Follow your health care provider's instructions if you were told to put  medicine in the water. Sit on the seat. Make sure the water covers your buttocks and perineum. Soak in the water for 15-20 minutes, or as long as told by your health care provider. After the sitz bath, pat yourself dry. Do not rub your skin to dry it. Clean and dry the basin between uses. Discard the basin if it cracks, or according to the manufacturer's instructions.  Contact a health care provider if: Your pain or itching gets worse. Stop doing sitz baths if your symptoms get worse. You have new symptoms. Stop doing sitz baths until you talk with your health care provider. Summary A sitz bath is a warm water bath in which the water only comes up to your hips and covers your buttocks. Your health care provider may recommend a sitz bath to help relieve pain and discomfort after  delivering a baby, relieve pain and itching from hemorrhoids or anal fissures, relieve pain after certain surgeries, or help to relax muscles that are sore or tight. Take 2-4 sitz baths a day, or as many as told by your health care provider. Soak in the water for 15-20 minutes. Stop doing sitz baths if your symptoms get worse. This information is not intended to replace advice given to you by your health care provider. Make sure you discuss any questions you have with your health care provider. Document Revised: 03/15/2022 Document Reviewed: 03/15/2022 Elsevier Patient Education  [place].

## 2022-09-13 ENCOUNTER — Encounter: Payer: Self-pay | Admitting: Surgery

## 2022-09-13 NOTE — Progress Notes (Signed)
09/13/2022  Reason for Visit:  Thrombosed external hemorrhoid  Requesting Provider:  Verita Schneiders, MD  History of Present Illness: Susan [name] is a 71 y.o. female presenting for evaluation of thrombosed hemorrhoid.  The patient was seen by her OB/GYN on 8/29 due to vulvar irritation, and on exam, she was also noted to have a thrombosed external hemorrhoid.  The patient reports that she will sometimes have flareups where she get some increased discomfort in the perianal area, particularly if she had been constipation.  This resolves on its own.  She had not had any episodes of thrombosed hemorrhoid in the past to her knowledge.  However, she denies any worsening or significant symptoms when it comes to this episode, and she had gone to her OB/GYN for a different issue.  She's been using Witch hazel and Preparation H.  She currently reports some soreness, denies any blood in her stool.  She denies any current constipation and she takes fiber and sometimes will use prunes if she has not had a bowel movement in 2 days.  Past Medical History: Past Medical History:  Diagnosis Date   Actinic keratosis    Arthritis    Basal cell carcinoma 05/26/2016   R chest parasternal   Basal cell carcinoma 01/21/2015   L nose supratip    Breast cancer (HCC)    Left DCIS   Cancer (HCC)    Complication of anesthesia    Dysrhythmia    PSVT on metoprolol   Family history of breast cancer    Gastric reflux 90's   GERD (gastroesophageal reflux disease)    Headache    H/O MIGRAINES   Hiatal hernia 2003   History of Coronary CT for Calcium Scoring    a. 11/2018 CT Cor Ca2+ = Zero.   Hypertension    Mitral regurgitation    a. 03/2017 Echo: EF 55-60%, no nrwma, Mod MR; b. 03/2018 Echo: EF 60-65%, no rwma. Gr2 DD. Mild MV prolapse of ant leaflet w/ mild to mod mR. Nl RV fxn. Mild to mod TR.   Personal history of radiation therapy 2019   LEFT lumpectomy   PONV (postoperative nausea and vomiting)    N/V    PSVT (paroxysmal supraventricular tachycardia) (HCC)    a. 03/2017 Event Monitor: Avg HR 66, 10 episodes of SVT up to 42.8 secs, fastest 148 bpm. 8 beats WCT (179 bpm) - VT vs SVT w/ aberrancy. Rare PACs (2.6% total beats).   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   R prox lat bicep     Past Surgical History: Past Surgical History:  Procedure Laterality Date   AUGMENTATION MAMMAPLASTY Bilateral 01/17/2020   BREAST BIOPSY Left 06/13/2018   Affirm Bx- coil clip   atypical sclerosing and DCIS   BREAST ENHANCEMENT SURGERY Bilateral 01/17/2020   Procedure: MAMMOPLASTY AUGMENTATION WITH PROSTHESIS (BREAST);  Surgeon: Cindra Presume, MD;  Location: [place];  Service: Plastics;  Laterality: Bilateral;  2.5 hours for total case   BREAST EXCISIONAL BIOPSY Right 2000   benign   BREAST LUMPECTOMY Left 06/26/2018   atypical sclerosing lesion and DCIS bracketing   BREAST LUMPECTOMY WITH NEEDLE LOCALIZATION Left 06/26/2018   Procedure: BREAST LUMPECTOMY WITH NEEDLE LOCALIZATION;  Surgeon: Jules Husbands, MD;  Location: [facility];  Service: General;  Laterality: Left;   CHOLECYSTECTOMY     OVARIAN CYST SURGERY     SCAR REVISION Left 01/17/2020   Procedure: POSSIBLE SCAR REVISION TO LEFT BREAST;  Surgeon: Mingo Amber  S, MD;  Location: [place];  Service: Plastics;  Laterality: Left;   SKIN FULL THICKNESS GRAFT  01/17/2020   Procedure: SKIN GRAFT FULL THICKNESS;  Surgeon: Cindra Presume, MD;  Location: [place];  Service: Plastics;;   TONSILLECTOMY     age 77   UPPER GI ENDOSCOPY  2003    Home Medications: Prior to Admission medications   Medication Sig Start Date End Date Taking? Authorizing Provider  acetaminophen (TYLENOL) 500 MG tablet Take 1,000 mg by mouth every 6 (six) hours as needed.   Yes [provider]  ALPRAZolam (XANAX) 0.5 MG tablet Take 1 tablet (0.5 mg total) by mouth at bedtime as needed. for sleep 06/01/22  Yes Lesleigh Noe, MD  calcium citrate-vitamin D 500-500 MG-UNIT chewable tablet Chew 1 tablet by mouth 2 (two) times daily.   Yes [provider]  cholecalciferol (VITAMIN D) 1000 units tablet Take 1,000 Units by mouth daily.   Yes [provider]  clobetasol ointment (TEMOVATE) 0.05 % Apply to affected area every night for 4 weeks, then every other day for 4 weeks and then twice a week for 4 weeks or until resolution. 08/24/22  Yes Anyanwu, Sallyanne Havers, MD  famotidine (PEPCID) 20 MG tablet Take 20 mg by mouth as needed for heartburn or indigestion.   Yes [provider]  fluticasone (FLONASE) 50 MCG/ACT nasal spray Place 2 sprays into both nostrils as needed.    Yes [provider]  Ivermectin 1 % CREA Apply 1 Application topically at bedtime. 09/08/22  Yes Ralene Bathe, MD  metoprolol succinate (TOPROL-XL) 25 MG 24 hr tablet Take 12.5 mg by mouth daily. 07/17/22  Yes [provider]  Multiple Vitamin (MULTIVITAMIN) tablet Take 1 tablet by mouth daily.   Yes [provider]  NAPROXEN PO Take 1 tablet by mouth every 12 (twelve) hours as needed (pain).   Yes [provider]  psyllium (REGULOID) 0.52 g capsule Take 0.52 g by mouth 3 (three) times daily.    Yes [provider]  SIMETHICONE-80 PO Take 80 mg by mouth as needed (gas).    Yes [provider]  tamoxifen (NOLVADEX) 20 MG tablet TAKE 1 TABLET BY MOUTH EVERY DAY 08/18/22  Yes Sindy Guadeloupe, MD  topiramate (TOPAMAX) 25 MG tablet Take 1 tablet (25 mg total) by mouth 2 (two) times daily. 08/12/22  Yes Lesleigh Noe, MD    Allergies: No Known Allergies  Social History:  reports that she has never smoked. She has never used smokeless tobacco. She reports current alcohol use of about 2.0 standard drinks of alcohol per week. She reports that she does not use drugs.   Family History: Family History  Problem Relation Age of Onset   Hyperlipidemia Mother    Hypertension  Mother    COPD Mother    Heart failure Mother        deceased 58   Stroke Father 68   Hypertension Father    Hypertension Sister    Hypertension Brother    Healthy Brother    Breast cancer Cousin 36       daughter of paternal aunt; currently 26   Breast cancer Cousin 71       daughter of paternal uncle; currently 50   Breast cancer Cousin 52       daughter of maternal uncle; deceased 38   Testicular cancer Other 57       sister's son;  currently 8   Melanoma Paternal Uncle    Breast cancer Cousin 13       daughter of same maternal uncle; currently 29    Review of Systems: Review of Systems  Constitutional:  Negative for chills and fever.  Respiratory:  Negative for shortness of breath.   Cardiovascular:  Negative for chest pain.  Gastrointestinal:  Negative for abdominal pain, constipation, nausea and vomiting.  Skin:  Negative for rash.    Physical Exam BP 139/68   Pulse 60   Temp 98.3 F (36.8 C) (Oral)   Ht '5\' 8"'$  (1.727 m)   Wt 155 lb (70.3 kg)   SpO2 98%   BMI 23.57 kg/m  CONSTITUTIONAL: No acute distress HEENT:  Normocephalic, atraumatic, extraocular motion intact. RESPIRATORY:  Normal respiratory effort without pathologic use of accessory muscles. CARDIOVASCULAR: Regular rhythm and rate RECTAL:  External exam reveals a thrombosed external hemorrhoid in left lateral column.  It does appear to be resolving, as the clot/hemorrhoid feels relatively soft and does not have significant tenderness.  Digital rectal exam does not reveal any gross blood or significantly enlarged internal hemorrhoids. MUSCULOSKELETAL:  Normal muscle strength and tone in all four extremities.  No peripheral edema or cyanosis. NEUROLOGIC:  Motor and sensation is grossly normal.  Cranial nerves are grossly intact. PSYCH:  Alert and oriented to person, place and time. Affect is normal.  Laboratory Analysis: No results found for this or any previous visit (from the past 24  hour(s)).  Imaging: No results found.  Assessment and Plan: This is a 71 y.o. female with thrombosed external hemorrhoid  --Discussed with the patient that it is unusual that she's not having much pain with a thrombosed hemorrhoid.  Discussed the typical management of these and how timing is of the essence to evacuate the clot early in the process.  At this point, the clot is feeling softer and may be trying to reabsorb.  She's not really having any pain now, so don't think she necessarily needs prescriptions for Anusol or Lidocaine.  I did discuss the role for fiber, avoiding constipation, and Sitz baths.  She's in agreement. --Patient will continue her regimen with with Preparation H and witch hazel, and call us in a month if there's been no improvement.  I spent 30 minutes dedicated to the care of this patient on the date of this encounter to include pre-visit review of records, face-to-face time with the patient discussing diagnosis and management, and any post-visit coordination of care.   Melvyn Neth, [facility]

## 2022-10-06 ENCOUNTER — Encounter: Payer: Self-pay | Admitting: Obstetrics & Gynecology

## 2022-10-06 ENCOUNTER — Ambulatory Visit: Payer: PPO | Admitting: Obstetrics & Gynecology

## 2022-10-06 VITALS — BP 123/76 | HR 68 | Wt 157.0 lb

## 2022-10-06 DIAGNOSIS — N9089 Other specified noninflammatory disorders of vulva and perineum: Secondary | ICD-10-CM

## 2022-10-06 NOTE — Progress Notes (Signed)
GYNECOLOGY OFFICE VISIT NOTE  History:   Susan [name] is a 71 y.o. G0P0000 here today for follow up for vulvar irritation. Symptoms have resolved with Clobetasol, currently using three times a week . She denies any abnormal vaginal discharge, bleeding, pelvic pain or other concerns.    Past Medical History:  Diagnosis Date   Actinic keratosis    Arthritis    Basal cell carcinoma 05/26/2016   R chest parasternal   Basal cell carcinoma 01/21/2015   L nose supratip    Breast cancer (HCC)    Left DCIS   Cancer (HCC)    Complication of anesthesia    Dysrhythmia    PSVT on metoprolol   Family history of breast cancer    Gastric reflux 90's   GERD (gastroesophageal reflux disease)    Headache    H/O MIGRAINES   Hiatal hernia 2003   History of Coronary CT for Calcium Scoring    a. 11/2018 CT Cor Ca2+ = Zero.   Hypertension    Mitral regurgitation    a. 03/2017 Echo: EF 55-60%, no nrwma, Mod MR; b. 03/2018 Echo: EF 60-65%, no rwma. Gr2 DD. Mild MV prolapse of ant leaflet w/ mild to mod mR. Nl RV fxn. Mild to mod TR.   Personal history of radiation therapy 2019   LEFT lumpectomy   PONV (postoperative nausea and vomiting)    N/V   PSVT (paroxysmal supraventricular tachycardia)    a. 03/2017 Event Monitor: Avg HR 66, 10 episodes of SVT up to 42.8 secs, fastest 148 bpm. 8 beats WCT (179 bpm) - VT vs SVT w/ aberrancy. Rare PACs (2.6% total beats).   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   R prox lat bicep    Past Surgical History:  Procedure Laterality Date   AUGMENTATION MAMMAPLASTY Bilateral 01/17/2020   BREAST BIOPSY Left 06/13/2018   Affirm Bx- coil clip   atypical sclerosing and DCIS   BREAST ENHANCEMENT SURGERY Bilateral 01/17/2020   Procedure: MAMMOPLASTY AUGMENTATION WITH PROSTHESIS (BREAST);  Surgeon: Cindra Presume, MD;  Location: [place];  Service: Plastics;  Laterality: Bilateral;  2.5 hours for total case   BREAST EXCISIONAL BIOPSY Right 2000    benign   BREAST LUMPECTOMY Left 06/26/2018   atypical sclerosing lesion and DCIS bracketing   BREAST LUMPECTOMY WITH NEEDLE LOCALIZATION Left 06/26/2018   Procedure: BREAST LUMPECTOMY WITH NEEDLE LOCALIZATION;  Surgeon: Jules Husbands, MD;  Location: [facility];  Service: General;  Laterality: Left;   CHOLECYSTECTOMY     OVARIAN CYST SURGERY     SCAR REVISION Left 01/17/2020   Procedure: POSSIBLE SCAR REVISION TO LEFT BREAST;  Surgeon: Cindra Presume, MD;  Location: [place];  Service: Plastics;  Laterality: Left;   SKIN FULL THICKNESS GRAFT  01/17/2020   Procedure: SKIN GRAFT FULL THICKNESS;  Surgeon: Cindra Presume, MD;  Location: [place];  Service: Plastics;;   TONSILLECTOMY     age 57   UPPER GI ENDOSCOPY  2003   The following portions of the patient's history were reviewed and updated as appropriate: allergies, current medications, past family history, past medical history, past social history, past surgical history and problem list.   Review of Systems:  Pertinent items noted in HPI and remainder of comprehensive ROS otherwise negative.  Physical Exam:  BP 123/76   Pulse 68   Wt 157 lb (71.2 kg)   BMI 23.87 kg/m  CONSTITUTIONAL: Well-developed, well-nourished female in no  acute distress.  SKIN: No rash noted. Not diaphoretic. No erythema. No pallor. MUSCULOSKELETAL: Normal range of motion. No edema noted. NEUROLOGIC: Alert and oriented to person, place, and time. Normal muscle tone coordination. No cranial nerve deficit noted. PSYCHIATRIC: Normal mood and affect. Normal behavior. Normal judgment and thought content. CARDIOVASCULAR: Normal heart rate noted RESPIRATORY: Effort and breath sounds normal, no problems with respiration noted ABDOMEN: No masses noted. No other overt distention noted.   PELVIC: Deferred  08/23/2022   VULVA BIOPSY  - Benign unremarkable subcutaneous fibroadipose tissue with scant skeletal muscle  - Skin is not  identified    Assessment and Plan:    1. Vulvar irritation Continue Clobetasol as prescribed. If symptoms recur/worsen, may need to repeat biopsy given that no skin was seen on sample (reassured by benign result). Routine preventative health maintenance measures emphasized. Please refer to After Visit Summary for other counseling recommendations.   Return for any gynecologic concerns.    I spent 20 minutes dedicated to the care of this patient including pre-visit review of records, face to face time with the patient discussing her conditions and treatments and post visit orders.    Verita Schneiders, MD, [place] [facility] [employer], [place]

## 2022-10-25 NOTE — Progress Notes (Unsigned)
    Susan Hulick T. [name], MD, [place] [facility] [place], [address]  Phone: [phone]  FAX: [phone]  Susan [name] - 71 y.o. female  MRN [id number]  Date of Birth: 1951-04-09  Date: 10/26/2022  PCP: Waunita Schooner, MD  Referral: Waunita Schooner, MD  No chief complaint on file.  Subjective:   Susan [name] is a 71 y.o. very pleasant female patient with There is no height or weight on file to calculate BMI. who presents with the following:  Patient presents with some ongoing back pain.    Review of Systems is noted in the HPI, as appropriate  Objective:   There were no vitals taken for this visit.  GEN: No acute distress; alert,appropriate. PULM: Breathing comfortably in no respiratory distress PSYCH: Normally interactive.   Laboratory and Imaging Data:  Assessment and Plan:   ***

## 2022-10-26 ENCOUNTER — Ambulatory Visit (INDEPENDENT_AMBULATORY_CARE_PROVIDER_SITE_OTHER): Payer: PPO | Admitting: Family Medicine

## 2022-10-26 ENCOUNTER — Encounter: Payer: Self-pay | Admitting: Family Medicine

## 2022-10-26 VITALS — BP 144/84 | HR 67 | Temp 98.2°F | Ht 67.0 in | Wt 155.4 lb

## 2022-10-26 DIAGNOSIS — M545 Low back pain, unspecified: Secondary | ICD-10-CM | POA: Diagnosis not present

## 2022-11-02 DIAGNOSIS — M545 Low back pain, unspecified: Secondary | ICD-10-CM | POA: Diagnosis not present

## 2022-12-30 ENCOUNTER — Inpatient Hospital Stay: Payer: PPO | Attending: Oncology | Admitting: Oncology

## 2022-12-30 ENCOUNTER — Encounter: Payer: Self-pay | Admitting: Oncology

## 2022-12-30 VITALS — BP 137/72 | HR 76 | Temp 99.2°F | Resp 16 | Wt 148.0 lb

## 2022-12-30 DIAGNOSIS — D0512 Intraductal carcinoma in situ of left breast: Secondary | ICD-10-CM | POA: Insufficient documentation

## 2022-12-30 DIAGNOSIS — Z923 Personal history of irradiation: Secondary | ICD-10-CM | POA: Insufficient documentation

## 2022-12-30 DIAGNOSIS — Z79810 Long term (current) use of selective estrogen receptor modulators (SERMs): Secondary | ICD-10-CM | POA: Insufficient documentation

## 2022-12-30 DIAGNOSIS — Z79899 Other long term (current) drug therapy: Secondary | ICD-10-CM | POA: Insufficient documentation

## 2022-12-30 DIAGNOSIS — Z08 Encounter for follow-up examination after completed treatment for malignant neoplasm: Secondary | ICD-10-CM

## 2022-12-30 DIAGNOSIS — Z5181 Encounter for therapeutic drug level monitoring: Secondary | ICD-10-CM | POA: Diagnosis not present

## 2022-12-30 DIAGNOSIS — Z86000 Personal history of in-situ neoplasm of breast: Secondary | ICD-10-CM | POA: Diagnosis not present

## 2022-12-30 DIAGNOSIS — M85852 Other specified disorders of bone density and structure, left thigh: Secondary | ICD-10-CM | POA: Diagnosis not present

## 2022-12-30 NOTE — Progress Notes (Unsigned)
Pt in for follow up, denies any difficulties or concerns today. 

## 2023-01-01 NOTE — Progress Notes (Signed)
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  2 adjacent groups of heterogeneous calcifications spanning 1.1 x 2 x 2.4 cm.  She underwent lumpectomy in July 2019.  Pathology showed 2.8 cm grade 2-3 DCIS with sclerosing adenosis.  ER greater than 90% positive and PR negative.  She then underwent adjuvant radiation treatment and began tamoxifen in September 2019.  Patient has baseline osteopenia with a T score of -1.8 in the left femoral neck.   Interval history-tolerating tamoxifen well without any significant side effects.  Denies any breast concerns today  ECOG PS- 1 Pain scale- 0   Review of systems- Review of Systems  Constitutional:  Negative for chills, fever, malaise/fatigue and weight loss.  HENT:  Negative for congestion, ear discharge and nosebleeds.   Eyes:  Negative for blurred vision.  Respiratory:  Negative for cough, hemoptysis, sputum production, shortness of breath and wheezing.   Cardiovascular:  Negative for chest pain, palpitations, orthopnea and claudication.  Gastrointestinal:  Negative for abdominal pain, blood in stool, constipation, diarrhea, heartburn, melena, nausea and vomiting.  Genitourinary:  Negative for dysuria, flank pain, frequency, hematuria and urgency.  Musculoskeletal:  Negative for back pain, joint pain and myalgias.   Skin:  Negative for rash.  Neurological:  Negative for dizziness, tingling, focal weakness, seizures, weakness and headaches.  Endo/Heme/Allergies:  Does not bruise/bleed easily.  Psychiatric/Behavioral:  Negative for depression and suicidal ideas. The patient does not have insomnia.       No Known Allergies   Past Medical History:  Diagnosis Date   Actinic keratosis    Arthritis    Basal cell carcinoma 05/26/2016   R chest parasternal   Basal cell carcinoma 01/21/2015   L nose supratip    Breast cancer (HCC)    Left DCIS   Cancer (HCC)    Complication of anesthesia    Dysrhythmia    PSVT on metoprolol   Family history of breast cancer    Gastric reflux 90's   GERD (gastroesophageal reflux disease)    Headache    H/O MIGRAINES   Hiatal hernia 2003   History of Coronary CT for Calcium Scoring    a. 11/2018 CT Cor Ca2+ = Zero.   Hypertension    Mitral regurgitation    a. 03/2017 Echo: EF 55-60%, no nrwma, Mod MR; b. 03/2018 Echo: EF 60-65%, no rwma. Gr2 DD. Mild MV prolapse of ant leaflet w/ mild to mod mR. Nl RV fxn. Mild to mod TR.   Personal history of radiation therapy 2019   LEFT lumpectomy   PONV (postoperative nausea and vomiting)    N/V   PSVT (paroxysmal supraventricular tachycardia)    a. 03/2017 Event Monitor: Avg HR 66, 10 episodes of SVT up to 42.8 secs, fastest 148 bpm. 8 beats WCT (179 bpm) - VT vs SVT w/ aberrancy. Rare PACs (2.6% total beats).   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  R prox lat bicep     Past Surgical History:  Procedure Laterality Date   AUGMENTATION MAMMAPLASTY Bilateral 01/17/2020   BREAST BIOPSY Left 06/13/2018   Affirm Bx- coil clip   atypical sclerosing and DCIS   BREAST ENHANCEMENT SURGERY Bilateral 01/17/2020   Procedure: MAMMOPLASTY AUGMENTATION WITH PROSTHESIS (BREAST);  Surgeon: Cindra Presume, MD;  Location: [place];  Service: Plastics;  Laterality: Bilateral;  2.5 hours for total case   BREAST  EXCISIONAL BIOPSY Right 2000   benign   BREAST LUMPECTOMY Left 06/26/2018   atypical sclerosing lesion and DCIS bracketing   BREAST LUMPECTOMY WITH NEEDLE LOCALIZATION Left 06/26/2018   Procedure: BREAST LUMPECTOMY WITH NEEDLE LOCALIZATION;  Surgeon: Jules Husbands, MD;  Location: [facility];  Service: General;  Laterality: Left;   CHOLECYSTECTOMY     OVARIAN CYST SURGERY     SCAR REVISION Left 01/17/2020   Procedure: POSSIBLE SCAR REVISION TO LEFT BREAST;  Surgeon: Cindra Presume, MD;  Location: [place];  Service: Plastics;  Laterality: Left;   SKIN FULL THICKNESS GRAFT  01/17/2020   Procedure: SKIN GRAFT FULL THICKNESS;  Surgeon: Cindra Presume, MD;  Location: [place];  Service: Plastics;;   TONSILLECTOMY     age 37   UPPER GI ENDOSCOPY  2003    Social History   Socioeconomic History   Marital status: Married    Spouse name: Richard   Number of children: 2   Years of education: College   Highest education level: Not on file  Occupational History   Not on file  Tobacco Use   Smoking status: Never   Smokeless tobacco: Never  Vaping Use   Vaping Use: Never used  Substance and Sexual Activity   Alcohol use: Yes    Alcohol/week: 2.0 standard drinks of alcohol    Types: 2 Glasses of wine per week    Comment: daily WINE   Drug use: No   Sexual activity: Not Currently    Birth control/protection: Post-menopausal  Other Topics Concern   Not on file  Social History Narrative   03/30/20   From: the area, [place]: with husband Delfino Lovett [phone])   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      Family: 2 step children - Buyer, retail and [place] - 3 grandchildren, good relationships      Enjoys: garden, flowers      Exercise: walking, walking the dog 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) - daily, average 6 mile/day   Diet: fairly good       Safety   Seat belts: Yes    Guns: Yes  and secure   Safe in relationships: Yes    Social Determinants of Health    Financial Resource Strain: Low Risk  (01/27/2022)   Overall Financial Resource Strain (CARDIA)    Difficulty of Paying Living Expenses: Not hard at all  Food Insecurity: No Food Insecurity (01/27/2022)   Hunger Vital Sign    Worried About Running Out of Food in the Last Year: Never true    [place] in the Last Year: Never true  Transportation Needs: No Transportation Needs (01/27/2022)   PRAPARE - Hydrologist (Medical): No    Lack of Transportation (Non-Medical): No  Physical Activity: Sufficiently Active (01/27/2022)   Exercise Vital Sign    Days of Exercise per Week: 6 days    Minutes of Exercise per Session: 60 min  Stress: No Stress Concern  Present (01/27/2022)   [place]    Feeling of Stress : Not at all  Social Connections: Moderately Isolated (01/27/2022)   Social Connection and Isolation Panel [NHANES]    Frequency of Communication with Friends and Family: More than three times a week    Frequency of Social Gatherings with Friends and Family: More than three times a week    Attends Religious Services: Never    Marine scientist or Organizations: No    Attends Archivist Meetings: Never    Marital Status: Married  Human resources officer Violence: Not At Risk (01/27/2022)   Humiliation, Afraid, Rape, and Kick questionnaire    Fear of Current or Ex-Partner: No    Emotionally Abused: No    Physically Abused: No    Sexually Abused: No    Family History  Problem Relation Age of Onset   Hyperlipidemia Mother    Hypertension Mother    COPD Mother    Heart failure Mother        deceased 74   Stroke Father 77   Hypertension Father    Hypertension Sister    Hypertension Brother    Healthy Brother    Breast cancer Cousin 27       daughter of paternal aunt; currently 42   Breast cancer Cousin 17       daughter of paternal uncle; currently 16   Breast cancer Cousin 47        daughter of maternal uncle; deceased 52   Testicular cancer Other 53       sister's son; currently 69   Melanoma Paternal Uncle    Breast cancer Cousin 71       daughter of same maternal uncle; currently 81     Current Outpatient Medications:    acetaminophen (TYLENOL) 500 MG tablet, Take 1,000 mg by mouth every 6 (six) hours as needed., Disp: , Rfl:    ALPRAZolam (XANAX) 0.5 MG tablet, Take 1 tablet (0.5 mg total) by mouth at bedtime as needed. for sleep, Disp: 30 tablet, Rfl: 0   calcium citrate-vitamin D 500-500 MG-UNIT chewable tablet, Chew 1 tablet by mouth 2 (two) times daily., Disp: , Rfl:    cholecalciferol (VITAMIN D) 1000 units tablet, Take 1,000 Units by mouth daily., Disp: , Rfl:    clobetasol ointment (TEMOVATE) 0.05 %, Apply to affected area every night for 4 weeks, then every other day for 4 weeks and then twice a week for 4 weeks or until resolution., Disp: 30 g, Rfl: 5   famotidine (PEPCID) 20 MG tablet, Take 20 mg by mouth as needed for heartburn or indigestion., Disp: , Rfl:    fluticasone (FLONASE) 50 MCG/ACT nasal spray, Place 2 sprays into both nostrils as needed. , Disp: , Rfl:    Ivermectin 1 % CREA, Apply 1 Application topically at bedtime., Disp: 30 g, Rfl: 11   metoprolol succinate (TOPROL-XL) 25 MG 24 hr tablet, Take 12.5 mg by mouth daily., Disp: , Rfl:    Multiple Vitamin (MULTIVITAMIN) tablet, Take 1 tablet by mouth daily., Disp: , Rfl:    NAPROXEN PO, Take 1 tablet by mouth every 12 (twelve) hours as needed (pain)., Disp: , Rfl:    psyllium (REGULOID) 0.52 g capsule, Take 0.52 g by mouth 3 (three) times daily. , Disp: , Rfl:    SIMETHICONE-80 PO, Take 80 mg by mouth as needed (gas). , Disp: , Rfl:    tamoxifen (NOLVADEX) 20  MG tablet, TAKE 1 TABLET BY MOUTH EVERY DAY, Disp: 90 tablet, Rfl: 3   topiramate (TOPAMAX) 25 MG tablet, Take 1 tablet (25 mg total) by mouth 2 (two) times daily., Disp: 180 tablet, Rfl: 1  Physical exam:  Vitals:   12/30/22 1427   BP: 137/72  Pulse: 76  Resp: 16  Temp: 99.2 F (37.3 C)  TempSrc: Tympanic  SpO2: 98%  Weight: 148 lb (67.1 kg)   Physical Exam Cardiovascular:     Rate and Rhythm: Normal rate and regular rhythm.     Heart sounds: Normal heart sounds.  Pulmonary:     Effort: Pulmonary effort is normal.     Breath sounds: Normal breath sounds.  Abdominal:     General: Bowel sounds are normal.     Palpations: Abdomen is soft.  Skin:    General: Skin is warm and dry.  Neurological:     Mental Status: She is alert and oriented to person, place, and time.    Breast exam was performed in seated and lying down position. Patient is status post left lumpectomy with a well-healed surgical scar. No evidence of any palpable masses. No evidence of axillary adenopathy. No evidence of any palpable masses or lumps in the right breast. No evidence of right axillary adenopathy      Latest Ref Rng & Units 08/12/2022    9:57 AM  CMP  Glucose 70 - 99 mg/dL 92   BUN 6 - 23 mg/dL 18   Creatinine 0.40 - 1.20 mg/dL 0.83   Sodium 135 - 145 mEq/L 139   Potassium 3.5 - 5.1 mEq/L 4.5   Chloride 96 - 112 mEq/L 105   CO2 19 - 32 mEq/L 26   Calcium 8.4 - 10.5 mg/dL 9.0   Total Protein 6.0 - 8.3 g/dL 6.8   Total Bilirubin 0.2 - 1.2 mg/dL 0.5   Alkaline Phos 39 - 117 U/L 46   AST 0 - 37 U/L 25   ALT 0 - 35 U/L 18       Latest Ref Rng & Units 08/12/2022    9:57 AM  CBC  WBC 4.0 - 10.5 K/uL 5.6   Hemoglobin 12.0 - 15.0 g/dL 14.4   Hematocrit 36.0 - 46.0 % 44.4   Platelets 150.0 - 400.0 K/uL 273.0      Assessment and plan- Patient is a 72 y.o. 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  Her mammogram from June 2023 was unremarkable.  I will see her back in 6 months no labs.  Patient will continue taking tamoxifen until September 2024 and then stop.   Visit Diagnosis 1.  Encounter for follow-up surveillance of ductal carcinoma in situ (DCIS) of breast   2. Ductal carcinoma in situ of left breast   3. Encounter for monitoring tamoxifen therapy      Dr. Randa Evens, MD, MPH [facility] [phone] 01/01/2023 2:06 PM

## 2023-01-17 ENCOUNTER — Ambulatory Visit (INDEPENDENT_AMBULATORY_CARE_PROVIDER_SITE_OTHER)
Admission: RE | Admit: 2023-01-17 | Discharge: 2023-01-17 | Disposition: A | Discharge status: Home / Self Care | Payer: PPO | Source: Ambulatory Visit | Attending: Family | Admitting: Family

## 2023-01-17 ENCOUNTER — Encounter: Payer: Self-pay | Admitting: Family

## 2023-01-17 ENCOUNTER — Ambulatory Visit (INDEPENDENT_AMBULATORY_CARE_PROVIDER_SITE_OTHER): Payer: PPO | Admitting: Family

## 2023-01-17 VITALS — BP 130/78 | HR 71 | Temp 97.9°F | Ht 67.0 in | Wt 148.0 lb

## 2023-01-17 DIAGNOSIS — Z853 Personal history of malignant neoplasm of breast: Secondary | ICD-10-CM | POA: Insufficient documentation

## 2023-01-17 DIAGNOSIS — M545 Low back pain, unspecified: Secondary | ICD-10-CM

## 2023-01-17 DIAGNOSIS — G8929 Other chronic pain: Secondary | ICD-10-CM | POA: Diagnosis not present

## 2023-01-17 DIAGNOSIS — Z6823 Body mass index (BMI) 23.0-23.9, adult: Secondary | ICD-10-CM | POA: Diagnosis not present

## 2023-01-17 DIAGNOSIS — G250 Essential tremor: Secondary | ICD-10-CM | POA: Diagnosis not present

## 2023-01-17 DIAGNOSIS — R002 Palpitations: Secondary | ICD-10-CM

## 2023-01-17 MED ORDER — MELOXICAM 7.5 MG PO TABS: 7.5000 mg | ORAL_TABLET | Freq: Every day | ORAL | 2 refills | Status: DC

## 2023-01-17 MED ORDER — MELOXICAM 7.5 MG PO TABS: 7.5000 mg | ORAL_TABLET | Freq: Every day | ORAL | 0 refills | Status: DC

## 2023-01-17 NOTE — Assessment & Plan Note (Signed)
Continue topamax.  Advised pt if worsening head tremor will refer to neurology for eval/treat

## 2023-01-17 NOTE — Progress Notes (Signed)
New Patient Office Visit  Subjective:  Patient ID: Susan [name], female    DOB: 1951-10-18  Age: 72 y.o. MRN: [id number]  CC:  Chief Complaint  Patient presents with   Transitions Of Care    Discuss nails; patient states nails are very brittle. Discuss trying biotin.    Back Pain    X years; getting worse. Takes naproxen for the pain and mostly helps.     HPI Susan [name] is here for a transition of care visit. Oriented to practice routines and expectations.  Prior provider was:Dr. Waunita Schooner   Pt is with acute concerns.   Has noticed softness of nails, brittle, breaks easily. More so over the last one year.  Two thumb nails are senstiive, and if she hits them wrong they will split down the middle of the thumb. Has to keep cutting them so not as susceptible to breaking.  Was thinking about trying biotin. Toe nails are strong and not a concern.   Chronic low back pain, worse in am when getting up. She feels a dull ache in her midline lower back. No radiation of pain down the back. Movement does worsen it slightly. Denies recent injury or trauma. Did complete physical therapy but it aggravated the back rather than improve it.   chronic concerns:  Essential tremors, hand and head: takes topamax 25 mg twice daily, tried propanolol in the past but was not helpful. Couldn't tolerate. Has not seen a neurologist for a workup with this.   H/o breast cancer: year number tamoxifen 20 mg once daily. Still follows with oncologist, every six months.   Anxiety: xanax helps her as needed for sleep. Usually if she wakes up and can't go back to sleep.   Essential htn, and atrial tachycardia: on metoprolol 25 mg, sees cardiology.   Lichen sclerosis: clobetasol ointment , using for maintenance.   ROS: Negative unless specifically indicated above in HPI.   Current Outpatient Medications:    ALPRAZolam (XANAX) 0.5 MG tablet, Take 1 tablet (0.5 mg total) by mouth at bedtime as needed.  for sleep, Disp: 30 tablet, Rfl: 0   calcium citrate-vitamin D 500-500 MG-UNIT chewable tablet, Chew 1 tablet by mouth 2 (two) times daily., Disp: , Rfl:    cholecalciferol (VITAMIN D) 1000 units tablet, Take 1,000 Units by mouth daily., Disp: , Rfl:    clobetasol ointment (TEMOVATE) 0.05 %, Apply to affected area every night for 4 weeks, then every other day for 4 weeks and then twice a week for 4 weeks or until resolution., Disp: 30 g, Rfl: 5   famotidine (PEPCID) 20 MG tablet, Take 20 mg by mouth as needed for heartburn or indigestion., Disp: , Rfl:    fluticasone (FLONASE) 50 MCG/ACT nasal spray, Place 2 sprays into both nostrils as needed. , Disp: , Rfl:    Ivermectin 1 % CREA, Apply 1 Application topically at bedtime., Disp: 30 g, Rfl: 11   metoprolol succinate (TOPROL-XL) 25 MG 24 hr tablet, Take 12.5 mg by mouth daily., Disp: , Rfl:    Multiple Vitamin (MULTIVITAMIN) tablet, Take 1 tablet by mouth daily., Disp: , Rfl:    NAPROXEN PO, Take 1 tablet by mouth every 12 (twelve) hours as needed (pain)., Disp: , Rfl:    psyllium (REGULOID) 0.52 g capsule, Take 0.52 g by mouth 3 (three) times daily. , Disp: , Rfl:    SIMETHICONE-80 PO, Take 80 mg by mouth as needed (gas). , Disp: , Rfl:    tamoxifen (  NOLVADEX) 20 MG tablet, TAKE 1 TABLET BY MOUTH EVERY DAY, Disp: 90 tablet, Rfl: 3   topiramate (TOPAMAX) 25 MG tablet, Take 1 tablet (25 mg total) by mouth 2 (two) times daily., Disp: 180 tablet, Rfl: 1   meloxicam (MOBIC) 7.5 MG tablet, Take 1 tablet (7.5 mg total) by mouth daily., Disp: 30 tablet, Rfl: 2 Past Medical History:  Diagnosis Date   Actinic keratosis    Arthritis    Basal cell carcinoma 05/26/2016   R chest parasternal   Basal cell carcinoma 01/21/2015   L nose supratip    Breast cancer (HCC)    Left DCIS   Cancer (HCC)    Complication of anesthesia    Dysrhythmia    PSVT on metoprolol   Family history of breast cancer    Gastric reflux 90's   GERD (gastroesophageal reflux  disease)    Headache    H/O MIGRAINES   Hiatal hernia 2003   History of Coronary CT for Calcium Scoring    a. 11/2018 CT Cor Ca2+ = Zero.   Hypertension    Mitral regurgitation    a. 03/2017 Echo: EF 55-60%, no nrwma, Mod MR; b. 03/2018 Echo: EF 60-65%, no rwma. Gr2 DD. Mild MV prolapse of ant leaflet w/ mild to mod mR. Nl RV fxn. Mild to mod TR.   Personal history of radiation therapy 2019   LEFT lumpectomy   PONV (postoperative nausea and vomiting)    N/V   PSVT (paroxysmal supraventricular tachycardia)    a. 03/2017 Event Monitor: Avg HR 66, 10 episodes of SVT up to 42.8 secs, fastest 148 bpm. 8 beats WCT (179 bpm) - VT vs SVT w/ aberrancy. Rare PACs (2.6% total beats).   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   R prox lat bicep   Past Surgical History:  Procedure Laterality Date   AUGMENTATION MAMMAPLASTY Bilateral 01/17/2020   BREAST BIOPSY Left 06/13/2018   Affirm Bx- coil clip   atypical sclerosing and DCIS   BREAST ENHANCEMENT SURGERY Bilateral 01/17/2020   Procedure: MAMMOPLASTY AUGMENTATION WITH PROSTHESIS (BREAST);  Surgeon: Cindra Presume, MD;  Location: [place];  Service: Plastics;  Laterality: Bilateral;  2.5 hours for total case   BREAST EXCISIONAL BIOPSY Right 2000   benign   BREAST LUMPECTOMY WITH NEEDLE LOCALIZATION Left 06/26/2018   Procedure: BREAST LUMPECTOMY WITH NEEDLE LOCALIZATION;  Surgeon: Jules Husbands, MD;  Location: [facility];  Service: General;  Laterality: Left;   CHOLECYSTECTOMY     OVARIAN CYST SURGERY     SCAR REVISION Left 01/17/2020   Procedure: POSSIBLE SCAR REVISION TO LEFT BREAST;  Surgeon: Cindra Presume, MD;  Location: [place];  Service: Plastics;  Laterality: Left;   SKIN FULL THICKNESS GRAFT  01/17/2020   Procedure: SKIN GRAFT FULL THICKNESS;  Surgeon: Cindra Presume, MD;  Location: [place];  Service: Plastics;;   TONSILLECTOMY     age 31   UPPER GI ENDOSCOPY  2003     Objective:   Today's Vitals: BP 130/78 (BP Location: Left Arm, Patient Position: Sitting, Cuff Size: Normal)   Pulse 71   Temp 97.9 F (36.6 C) (Oral)   Ht '5\' 7"'$  (1.702 m)   Wt 148 lb (67.1 kg)   SpO2 98%   BMI 23.18 kg/m   Physical Exam Constitutional:      General: She is not in acute distress.    Appearance: Normal appearance. She is normal weight. She is  not ill-appearing, toxic-appearing or diaphoretic.  HENT:     Head: Normocephalic.  Cardiovascular:     Rate and Rhythm: Normal rate.  Pulmonary:     Effort: Pulmonary effort is normal.  Musculoskeletal:        General: Normal range of motion.     Lumbar back: Tenderness (very mild tenderness bil midline lower back) present. Normal range of motion.  Neurological:     General: No focal deficit present.     Mental Status: She is alert and oriented to person, place, and time. Mental status is at baseline.  Psychiatric:        Mood and Affect: Mood normal.        Behavior: Behavior normal.        Thought Content: Thought content normal.        Judgment: Judgment normal.     Assessment & Plan:  Chronic midline low back pain without sciatica Assessment & Plan: Unsuccessful physical therapy Worsening back pain  Stop naproxen, change to meloxicam 7.5 mg once daily  Xray today, suspected OA  Will refer to orthopedist if still no improvement for possible MRI  Orders: -     Meloxicam; Take 1 tablet (7.5 mg total) by mouth daily.  Dispense: 30 tablet; Refill: 2 -     DG Lumbar Spine Complete; Future  History of breast cancer Assessment & Plan: Continue f/u with oncologist as scheduled. Continue tamoxifen as prescribed.   Body mass index (BMI) 23.0-23.9, adult  Palpitations Assessment & Plan: Stable continue metoprolol 25 mg   Essential tremor Assessment & Plan: Continue topamax.  Advised pt if worsening head tremor will refer to neurology for eval/treat      Follow-up: Return in about 6 months  (around 07/18/2023) for f/u CPE.   Eugenia Pancoast, FNP

## 2023-01-17 NOTE — Assessment & Plan Note (Signed)
Continue f/u with oncologist as scheduled. Continue tamoxifen as prescribed.

## 2023-01-17 NOTE — Patient Instructions (Addendum)
  Biotin is fine to start trying to see if improvement with nails.  If no improvement see dermatology for other possible causes.   Recommend starting RX meloxicam 7.5 once daily, you will take this in place of naproxen.   For biotin, 30 mcg daily is recommended.   Welcome to our clinic, I am happy to have you as my new patient. I am excited to continue on this healthcare journey with you.  ------------------------------------  Stop by the lab prior to leaving today. I will notify you of your results once received.   Complete xray(s) prior to leaving today. I will notify you of your results once received.  Please keep in mind Any my chart messages you send have up to a three business day turnaround for a response.  Phone calls may take up to a one full business day turnaround for a  response.   If you need a medication refill I recommend you request it through the pharmacy as this is easiest for [place] rather than sending a message and or phone call.   Due to recent changes in healthcare laws, you may see results of your imaging and/or laboratory studies on MyChart before I have had a chance to review them.  I understand that in some cases there may be results that are confusing or concerning to you. Please understand that not all results are received at the same time and often I may need to interpret multiple results in order to provide you with the best plan of care or course of treatment. Therefore, I ask that you please give me 2 business days to thoroughly review all your results before contacting my office for clarification. Should we see a critical lab result, you will be contacted sooner.   It was a pleasure seeing you today! Please do not hesitate to reach out with any questions and or concerns.  Regards,   Eugenia Pancoast FNP-C

## 2023-01-17 NOTE — Assessment & Plan Note (Signed)
Unsuccessful physical therapy Worsening back pain  Stop naproxen, change to meloxicam 7.5 mg once daily  Xray today, suspected OA  Will refer to orthopedist if still no improvement for possible MRI

## 2023-01-17 NOTE — Assessment & Plan Note (Signed)
Stable continue metoprolol 25 mg

## 2023-01-19 NOTE — Progress Notes (Signed)
No acute findings on xray of the lower back, age related changes.  We could increase her melodic to 15 mg if interested?

## 2023-01-20 ENCOUNTER — Other Ambulatory Visit: Payer: Self-pay | Admitting: Family

## 2023-01-20 DIAGNOSIS — M85852 Other specified disorders of bone density and structure, left thigh: Secondary | ICD-10-CM

## 2023-01-20 DIAGNOSIS — M545 Low back pain, unspecified: Secondary | ICD-10-CM

## 2023-01-20 MED ORDER — MELOXICAM 15 MG PO TABS: 15.0000 mg | ORAL_TABLET | Freq: Every day | ORAL | 1 refills | Status: DC

## 2023-01-20 NOTE — Progress Notes (Signed)
Noted and increased refill sent to pharmacy for meloxicam 15 mg

## 2023-01-23 ENCOUNTER — Other Ambulatory Visit: Payer: Self-pay

## 2023-01-23 DIAGNOSIS — G250 Essential tremor: Secondary | ICD-10-CM

## 2023-01-23 NOTE — Telephone Encounter (Signed)
Received refill request on Topamax. You have not prescribed. Came from Dr. Einar Pheasant in the past. Did not see where addressed with you in the past.

## 2023-01-24 MED ORDER — TOPIRAMATE 25 MG PO TABS: 25.0000 mg | ORAL_TABLET | Freq: Two times a day (BID) | ORAL | 1 refills | Status: DC

## 2023-01-30 ENCOUNTER — Ambulatory Visit (INDEPENDENT_AMBULATORY_CARE_PROVIDER_SITE_OTHER): Payer: PPO

## 2023-01-30 VITALS — BP 128/72 | HR 56 | Ht 67.0 in | Wt 145.0 lb

## 2023-01-30 DIAGNOSIS — Z0000 Encounter for general adult medical examination without abnormal findings: Secondary | ICD-10-CM

## 2023-01-30 NOTE — Patient Instructions (Addendum)
[name] , Thank you for taking time to come for your Medicare Wellness Visit. I appreciate your ongoing commitment to your health goals. Please review the following plan we discussed and let me know if I can assist you in the future.   These are the goals we discussed:  Goals Addressed             This Visit's Progress    Patient Stated       Would like to maintain current routine.         This is a list of the screening recommended for you and due dates:  Health Maintenance  Topic Date Due   Hepatitis C Screening: USPSTF Recommendation to screen - Ages 75-79 yo.  Never done   DTaP/Tdap/Td vaccine (2 - Tdap) 12/03/2018   COVID-19 Vaccine (5 - 2023-24 season) 02/02/2023*   Medicare Annual Wellness Visit  01/31/2024   Mammogram  06/20/2024   Colon Cancer Screening  06/16/2027   Pneumonia Vaccine  Completed   Flu Shot  Completed   DEXA scan (bone density measurement)  Completed   Zoster (Shingles) Vaccine  Completed   HPV Vaccine  Aged Out   Stool Blood Test  Discontinued  *Topic was postponed. The date shown is not the original due date.    Advanced directives: on file.  Conditions/risks identified: none new  Next appointment: Follow up in one year for your annual wellness visit    Preventive Care 65 Years and Older, Female Preventive care refers to lifestyle choices and visits with your health care provider that can promote health and wellness. What does preventive care include? A yearly physical exam. This is also called an annual well check. Dental exams once or twice a year. Routine eye exams. Ask your health care provider how often you should have your eyes checked. Personal lifestyle choices, including: Daily care of your teeth and gums. Regular physical activity. Eating a healthy diet. Avoiding tobacco and drug use. Limiting alcohol use. Practicing safe sex. Taking low-dose aspirin every day. Taking vitamin and mineral supplements as recommended by  your health care provider. What happens during an annual well check? The services and screenings done by your health care provider during your annual well check will depend on your age, overall health, lifestyle risk factors, and family history of disease. Counseling  Your health care provider may ask you questions about your: Alcohol use. Tobacco use. Drug use. Emotional well-being. Home and relationship well-being. Sexual activity. Eating habits. History of falls. Memory and ability to understand (cognition). Work and work Statistician. Reproductive health. Screening  You may have the following tests or measurements: Height, weight, and BMI. Blood pressure. Lipid and cholesterol levels. These may be checked every 5 years, or more frequently if you are over 62 years old. Skin check. Lung cancer screening. You may have this screening every year starting at age 56 if you have a 30-pack-year history of smoking and currently smoke or have quit within the past 15 years. Fecal occult blood test (FOBT) of the stool. You may have this test every year starting at age 20. Flexible sigmoidoscopy or colonoscopy. You may have a sigmoidoscopy every 5 years or a colonoscopy every 10 years starting at age 57. Hepatitis C blood test. Hepatitis B blood test. Sexually transmitted disease (STD) testing. Diabetes screening. This is done by checking your blood sugar (glucose) after you have not eaten for a while (fasting). You may have this done every 1-3 years. Bone density scan.  This is done to screen for osteoporosis. You may have this done starting at age 70. Mammogram. This may be done every 1-2 years. Talk to your health care provider about how often you should have regular mammograms. Talk with your health care provider about your test results, treatment options, and if necessary, the need for more tests. Vaccines  Your health care provider may recommend certain vaccines, such as: Influenza  vaccine. This is recommended every year. Tetanus, diphtheria, and acellular pertussis (Tdap, Td) vaccine. You may need a Td booster every 10 years. Zoster vaccine. You may need this after age 20. Pneumococcal 13-valent conjugate (PCV13) vaccine. One dose is recommended after age 58. Pneumococcal polysaccharide (PPSV23) vaccine. One dose is recommended after age 9. Talk to your health care provider about which screenings and vaccines you need and how often you need them. This information is not intended to replace advice given to you by your health care provider. Make sure you discuss any questions you have with your health care provider. Document Released: 01/08/2016 Document Revised: 08/31/2016 Document Reviewed: 10/13/2015 Elsevier Interactive Patient Education  2017 [place] Prevention in the Home Falls can cause injuries. They can happen to people of all ages. There are many things you can do to make your home safe and to help prevent falls. What can I do on the outside of my home? Regularly fix the edges of walkways and driveways and fix any cracks. Remove anything that might make you trip as you walk through a door, such as a raised step or threshold. Trim any bushes or trees on the path to your home. Use bright outdoor lighting. Clear any walking paths of anything that might make someone trip, such as rocks or tools. Regularly check to see if handrails are loose or broken. Make sure that both sides of any steps have handrails. Any raised decks and porches should have guardrails on the edges. Have any leaves, snow, or ice cleared regularly. Use sand or salt on walking paths during winter. Clean up any spills in your garage right away. This includes oil or grease spills. What can I do in the bathroom? Use night lights. Install grab bars by the toilet and in the tub and shower. Do not use towel bars as grab bars. Use non-skid mats or decals in the tub or shower. If you  need to sit down in the shower, use a plastic, non-slip stool. Keep the floor dry. Clean up any water that spills on the floor as soon as it happens. Remove soap buildup in the tub or shower regularly. Attach bath mats securely with double-sided non-slip rug tape. Do not have throw rugs and other things on the floor that can make you trip. What can I do in the bedroom? Use night lights. Make sure that you have a light by your bed that is easy to reach. Do not use any sheets or blankets that are too big for your bed. They should not hang down onto the floor. Have a firm chair that has side arms. You can use this for support while you get dressed. Do not have throw rugs and other things on the floor that can make you trip. What can I do in the kitchen? Clean up any spills right away. Avoid walking on wet floors. Keep items that you use a lot in easy-to-reach places. If you need to reach something above you, use a strong step stool that has a grab bar. Keep electrical cords  out of the way. Do not use floor polish or wax that makes floors slippery. If you must use wax, use non-skid floor wax. Do not have throw rugs and other things on the floor that can make you trip. What can I do with my stairs? Do not leave any items on the stairs. Make sure that there are handrails on both sides of the stairs and use them. Fix handrails that are broken or loose. Make sure that handrails are as long as the stairways. Check any carpeting to make sure that it is firmly attached to the stairs. Fix any carpet that is loose or worn. Avoid having throw rugs at the top or bottom of the stairs. If you do have throw rugs, attach them to the floor with carpet tape. Make sure that you have a light switch at the top of the stairs and the bottom of the stairs. If you do not have them, ask someone to add them for you. What else can I do to help prevent falls? Wear shoes that: Do not have high heels. Have rubber  bottoms. Are comfortable and fit you well. Are closed at the toe. Do not wear sandals. If you use a stepladder: Make sure that it is fully opened. Do not climb a closed stepladder. Make sure that both sides of the stepladder are locked into place. Ask someone to hold it for you, if possible. Clearly mark and make sure that you can see: Any grab bars or handrails. First and last steps. Where the edge of each step is. Use tools that help you move around (mobility aids) if they are needed. These include: Canes. Walkers. Scooters. Crutches. Turn on the lights when you go into a dark area. Replace any light bulbs as soon as they burn out. Set up your furniture so you have a clear path. Avoid moving your furniture around. If any of your floors are uneven, fix them. If there are any pets around you, be aware of where they are. Review your medicines with your doctor. Some medicines can make you feel dizzy. This can increase your chance of falling. Ask your doctor what other things that you can do to help prevent falls. This information is not intended to replace advice given to you by your health care provider. Make sure you discuss any questions you have with your health care provider. Document Released: 10/08/2009 Document Revised: 05/19/2016 Document Reviewed: 01/16/2015 Elsevier Interactive Patient Education  2017 [employer].

## 2023-01-30 NOTE — Progress Notes (Signed)
Subjective:   Susan [name] is a 72 y.o. female who presents for Medicare Annual (Subsequent) preventive examination.  Review of Systems    No ROS.  Medicare Wellness Virtual Visit.  Visual/audio telehealth visit, UTA vital signs.   See social history for additional risk factors.   Cardiac Risk Factors include: advanced age (>83mn, >>88women);hypertension     Objective:    Today's Vitals   01/30/23 1427  BP: 128/72  Pulse: (!) 56  Weight: 145 lb (65.8 kg)  Height: '5\' 7"'$  (1.702 m)   Body mass index is 22.71 kg/m.     01/30/2023    2:28 PM 12/30/2022    2:21 PM 06/24/2022   11:23 AM 02/04/2022    9:31 AM 01/27/2022    2:52 PM 12/21/2021   12:58 PM 01/26/2021    2:52 PM  Advanced Directives  Does Patient Have a Medical Advance Directive? Yes No Yes Yes Yes No Yes  Type of AParamedicof [place] will  Healthcare Power of [place]Living will [place]Living will  [place] will  Does patient want to make changes to medical advance directive? No - Patient declined   No - Patient declined Yes (MAU/Ambulatory/Procedural Areas - Information given)    Copy of [place] Chart? Yes - validated most recent copy scanned in chart (See row information)   No - copy requested   No - copy requested  Would patient like information on creating a medical advance directive?      No - Patient declined     Current Medications (verified) Outpatient Encounter Medications as of 01/30/2023  Medication Sig   ALPRAZolam (XANAX) 0.5 MG tablet Take 1 tablet (0.5 mg total) by mouth at bedtime as needed. for sleep   calcium citrate-vitamin D 500-500 MG-UNIT chewable tablet Chew 1 tablet by mouth 2 (two) times daily.   cholecalciferol (VITAMIN D) 1000 units tablet Take 1,000 Units by mouth daily.   clobetasol ointment (TEMOVATE) 0.05 % Apply to affected area every night for 4 weeks,  then every other day for 4 weeks and then twice a week for 4 weeks or until resolution.   famotidine (PEPCID) 20 MG tablet Take 20 mg by mouth as needed for heartburn or indigestion.   fluticasone (FLONASE) 50 MCG/ACT nasal spray Place 2 sprays into both nostrils as needed.    Ivermectin 1 % CREA Apply 1 Application topically at bedtime.   meloxicam (MOBIC) 15 MG tablet Take 1 tablet (15 mg total) by mouth daily.   metoprolol succinate (TOPROL-XL) 25 MG 24 hr tablet Take 12.5 mg by mouth daily.   Multiple Vitamin (MULTIVITAMIN) tablet Take 1 tablet by mouth daily.   NAPROXEN PO Take 1 tablet by mouth every 12 (twelve) hours as needed (pain).   psyllium (REGULOID) 0.52 g capsule Take 0.52 g by mouth 3 (three) times daily.    SIMETHICONE-80 PO Take 80 mg by mouth as needed (gas).    tamoxifen (NOLVADEX) 20 MG tablet TAKE 1 TABLET BY MOUTH EVERY DAY   topiramate (TOPAMAX) 25 MG tablet Take 1 tablet (25 mg total) by mouth 2 (two) times daily.   No facility-administered encounter medications on file as of 01/30/2023.    Allergies (verified) Patient has no known allergies.   History: Past Medical History:  Diagnosis Date   Actinic keratosis    Arthritis    Basal cell carcinoma 05/26/2016   R chest parasternal  Basal cell carcinoma 01/21/2015   L nose supratip    Breast cancer (HCC)    Left DCIS   Cancer (HCC)    Complication of anesthesia    Dysrhythmia    PSVT on metoprolol   Family history of breast cancer    Gastric reflux 90's   GERD (gastroesophageal reflux disease)    Headache    H/O MIGRAINES   Hiatal hernia 2003   History of Coronary CT for Calcium Scoring    a. 11/2018 CT Cor Ca2+ = Zero.   Hypertension    Mitral regurgitation    a. 03/2017 Echo: EF 55-60%, no nrwma, Mod MR; b. 03/2018 Echo: EF 60-65%, no rwma. Gr2 DD. Mild MV prolapse of ant leaflet w/ mild to mod mR. Nl RV fxn. Mild to mod TR.   Personal history of radiation therapy 2019   LEFT lumpectomy   PONV  (postoperative nausea and vomiting)    N/V   PSVT (paroxysmal supraventricular tachycardia)    a. 03/2017 Event Monitor: Avg HR 66, 10 episodes of SVT up to 42.8 secs, fastest 148 bpm. 8 beats WCT (179 bpm) - VT vs SVT w/ aberrancy. Rare PACs (2.6% total beats).   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   R prox lat bicep   Past Surgical History:  Procedure Laterality Date   AUGMENTATION MAMMAPLASTY Bilateral 01/17/2020   BREAST BIOPSY Left 06/13/2018   Affirm Bx- coil clip   atypical sclerosing and DCIS   BREAST ENHANCEMENT SURGERY Bilateral 01/17/2020   Procedure: MAMMOPLASTY AUGMENTATION WITH PROSTHESIS (BREAST);  Surgeon: Cindra Presume, MD;  Location: [place];  Service: Plastics;  Laterality: Bilateral;  2.5 hours for total case   BREAST EXCISIONAL BIOPSY Right 2000   benign   BREAST LUMPECTOMY WITH NEEDLE LOCALIZATION Left 06/26/2018   Procedure: BREAST LUMPECTOMY WITH NEEDLE LOCALIZATION;  Surgeon: Jules Husbands, MD;  Location: [facility];  Service: General;  Laterality: Left;   CHOLECYSTECTOMY     OVARIAN CYST SURGERY     SCAR REVISION Left 01/17/2020   Procedure: POSSIBLE SCAR REVISION TO LEFT BREAST;  Surgeon: Cindra Presume, MD;  Location: [place];  Service: Plastics;  Laterality: Left;   SKIN FULL THICKNESS GRAFT  01/17/2020   Procedure: SKIN GRAFT FULL THICKNESS;  Surgeon: Cindra Presume, MD;  Location: [place];  Service: Plastics;;   TONSILLECTOMY     age 39   UPPER GI ENDOSCOPY  2003   Family History  Problem Relation Age of Onset   Hyperlipidemia Mother    Hypertension Mother    COPD Mother    Heart failure Mother        deceased 8   Stroke Father 65   Hypertension Father    Hypertension Sister    Hypertension Brother    Healthy Brother    Breast cancer Cousin 40       daughter of paternal aunt; currently 65   Breast cancer Cousin 89       daughter of paternal uncle; currently 13   Breast cancer  Cousin 56       daughter of maternal uncle; deceased 35   Testicular cancer Other 70       sister's son; currently 16   Melanoma Paternal Uncle    Breast cancer Cousin 38       daughter of same maternal uncle; currently 8   Social History   Socioeconomic History   Marital status: Married  Spouse name: Susan [name]   Number of children: 2   Years of education: College   Highest education level: Not on file  Occupational History   Occupation: retired  Tobacco Use   Smoking status: Never   Smokeless tobacco: Never  Vaping Use   Vaping Use: Never used  Substance and Sexual Activity   Alcohol use: Yes    Alcohol/week: 2.0 standard drinks of alcohol    Types: 2 Glasses of wine per week    Comment: daily WINE   Drug use: No   Sexual activity: Not Currently    Birth control/protection: Post-menopausal  Other Topics Concern   Not on file  Social History Narrative   03/30/20   From: the area, [place]: with husband Delfino Lovett [phone])   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      Family: 2 step children - Buyer, retail and [place] - 3 grandchildren, good relationships      Enjoys: garden, flowers      Exercise: walking, walking the dog 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) - daily, average 6 mile/day   Diet: fairly good       Safety   Seat belts: Yes    Guns: Yes  and secure   Safe in relationships: Yes    Social Determinants of Health   Financial Resource Strain: Low Risk  (01/27/2022)   Overall Financial Resource Strain (CARDIA)    Difficulty of Paying Living Expenses: Not hard at all  Food Insecurity: No Food Insecurity (01/27/2022)   Hunger Vital Sign    Worried About Running Out of Food in the Last Year: Never true    [place] in the Last Year: Never true  Transportation Needs: No Transportation Needs (01/27/2022)   PRAPARE - Hydrologist (Medical): No    Lack of Transportation (Non-Medical): No  Physical Activity: Sufficiently Active (01/27/2022)    Exercise Vital Sign    Days of Exercise per Week: 6 days    Minutes of Exercise per Session: 60 min  Stress: No Stress Concern Present (01/27/2022)   [place]    Feeling of Stress : Not at all  Social Connections: Moderately Isolated (01/27/2022)   Social Connection and Isolation Panel [NHANES]    Frequency of Communication with Friends and Family: More than three times a week    Frequency of Social Gatherings with Friends and Family: More than three times a week    Attends Religious Services: Never    Marine scientist or Organizations: No    Attends Music therapist: Never    Marital Status: Married    Tobacco Counseling Counseling given: Not Answered   Clinical Intake:  Pre-visit preparation completed: Yes           How often do you need to have someone help you when you read instructions, pamphlets, or other written materials from your doctor or pharmacy?: 1 - Never    Interpreter Needed?: No      Activities of Daily Living    01/30/2023    2:29 PM 01/26/2023    1:02 PM  In your present state of health, do you have any difficulty performing the following activities:  Hearing? 0 0  Vision? 0 0  Difficulty concentrating or making decisions? 0 0  Walking or climbing stairs? 0 0  Dressing or bathing? 0 0  Doing errands, shopping? 0 0  Preparing Food and eating ? N  N  Using the Toilet? N N  In the past six months, have you accidently leaked urine? Y Y  Comment No further assistance at this time. Declined. Stress incontinence.   Do you have problems with loss of bowel control? N N  Managing your Medications? N N  Managing your Finances? N N  Housekeeping or managing your Housekeeping? N N    Patient Care Team: Eugenia Pancoast, FNP as PCP - General (Family Medicine) Minna Merritts, MD as PCP - Cardiology (Cardiology) Tamsen Meek, MD as Referring Physician  (Dermatology) Sindy Guadeloupe, MD as Consulting Physician (Oncology)  Indicate any recent Medical Services you may have received from other than [facility] providers in the past year (date may be approximate).     Assessment:   This is a routine wellness examination for Susan [name].  I connected with  [name] on 01/30/23 by a audio enabled telemedicine application and verified that I am speaking with the correct person using two identifiers.  Patient Location: Home  Provider Location: Office/Clinic  I discussed the limitations of evaluation and management by telemedicine. The patient expressed understanding and agreed to proceed.   Hearing/Vision screen Hearing Screening - Comments:: Patient is able to hear conversational tones without difficulty.  No issues reported.   Vision Screening - Comments:: Last exam 05/2022, Dr. Wallace Going , wears glasses  Dietary issues and exercise activities discussed: Current Exercise Habits: Home exercise routine, Type of exercise: walking, Time (Minutes): 60, Frequency (Times/Week): 7, Weekly Exercise (Minutes/Week): 420, Intensity: Mild   Goals Addressed             This Visit's Progress    Patient Stated       Would like to maintain current routine.       Depression Screen    01/30/2023    2:37 PM 01/17/2023    9:40 AM 01/27/2022    2:54 PM 01/26/2021    2:53 PM 06/12/2019    9:06 AM 05/30/2018    9:08 AM 05/29/2017    2:13 PM  PHQ 2/9 Scores  PHQ - 2 Score 0 0 0 0 0 0 0  PHQ- 9 Score 0 0  0       Fall Risk    01/30/2023    2:29 PM 01/26/2023    1:02 PM 01/17/2023    9:39 AM 09/12/2022    4:19 PM 01/27/2022    2:53 PM  [place] in the past year? 0 0 0 0 0  Number falls in past yr:  0 0  0  Injury with Fall?  0 0  0  Risk for fall due to :   No Fall Risks  No Fall Risks  Follow up Falls evaluation completed;Falls prevention discussed  Falls evaluation completed  Falls prevention discussed    FALL RISK PREVENTION PERTAINING TO THE  HOME: Home free of loose throw rugs in walkways, pet beds, electrical cords, etc? Yes  Adequate lighting in your home to reduce risk of falls? Yes   ASSISTIVE DEVICES UTILIZED TO PREVENT FALLS: Life alert? No  Use of a cane, walker or w/c? No   TIMED UP AND GO: Was the test performed? No .   Cognitive Function:    01/26/2021    2:55 PM 05/29/2017    2:15 PM  MMSE - Mini Mental State Exam  Orientation to time 5 5  Orientation to Place 5 5  Registration 3 3  Attention/ Calculation 5 0  Recall 3 3  Language- name 2 objects  0  Language- repeat 1 1  Language- follow 3 step command  3  Language- read & follow direction  0  Write a sentence  0  Copy design  0  Total score  20        01/30/2023    2:31 PM  6CIT Screen  What Year? 0 points  What month? 0 points  What time? 0 points  Count back from 20 0 points  Months in reverse 0 points  Repeat phrase 0 points  Total Score 0 points    Immunizations Immunization History  Administered Date(s) Administered   Fluad Quad(high Dose 65+) 09/05/2019, 09/22/2021, 09/19/2022   Influenza, High Dose Seasonal PF 09/18/2018   Influenza,inj,Quad PF,6+ Mos 10/02/2013, 09/23/2015, 09/16/2016, 10/03/2017   Influenza-Unspecified 09/25/2020   PFIZER(Purple Top)SARS-COV-2 Vaccination 02/02/2020, 02/27/2020, 10/13/2020, 06/21/2021   Pneumococcal Conjugate-13 01/25/2016   Pneumococcal Polysaccharide-23 05/29/2017   Td 12/03/2008   Zoster Recombinat (Shingrix) 04/27/2020, 06/27/2020   Zoster, Live 09/29/2015    TDAP status: Due, Education has been provided regarding the importance of this vaccine. Advised may receive this vaccine at local pharmacy or Health Dept. Aware to provide a copy of the vaccination record if obtained from local pharmacy or Health Dept. Verbalized acceptance and understanding.  Screening Tests Health Maintenance  Topic Date Due   Hepatitis C Screening  Never done   DTaP/Tdap/Td (2 - Tdap) 12/03/2018   COVID-19  Vaccine (5 - 2023-24 season) 02/02/2023 (Originally 08/26/2022)   Medicare Annual Wellness (AWV)  01/31/2024   MAMMOGRAM  06/20/2024   COLONOSCOPY (Pts 45-15yr Insurance coverage will need to be confirmed)  06/16/2027   Pneumonia Vaccine 72 Years old  Completed   INFLUENZA VACCINE  Completed   DEXA SCAN  Completed   Zoster Vaccines- Shingrix  Completed   HPV VACCINES  Aged Out   COLON CANCER SCREENING ANNUAL FOBT  Discontinued    Health Maintenance Health Maintenance Due  Topic Date Due   Hepatitis C Screening  Never done   DTaP/Tdap/Td (2 - Tdap) 12/03/2018   Lung Cancer Screening: (Low Dose CT Chest recommended if Age 73-80years, 30 pack-year currently smoking OR have quit w/in 15years.) does not qualify.   Hepatitis C Screening: deferred due to audio visit.   Vision Screening: Recommended annual ophthalmology exams for early detection of glaucoma and other disorders of the eye.  Dental Screening: Recommended annual dental exams for proper oral hygiene  Community Resource Referral / Chronic Care Management: CRR required this visit?  No   CCM required this visit?  No      Plan:     I have personally reviewed and noted the following in the patient's chart:   Medical and social history Use of alcohol, tobacco or illicit drugs  Current medications and supplements including opioid prescriptions. Patient is not currently taking opioid prescriptions. Functional ability and status Nutritional status Physical activity Advanced directives List of other physicians Hospitalizations, surgeries, and ER visits in previous 12 months Vitals Screenings to include cognitive, depression, and falls Referrals and appointments  In addition, I have reviewed and discussed with patient certain preventive protocols, quality metrics, and best practice recommendations. A written personalized care plan for preventive services as well as general preventive health recommendations were provided  to patient.     DLeta Jungling LPN   20/02/5008

## 2023-02-13 DIAGNOSIS — L62 Nail disorders in diseases classified elsewhere: Secondary | ICD-10-CM | POA: Diagnosis not present

## 2023-02-16 ENCOUNTER — Other Ambulatory Visit: Payer: Self-pay | Admitting: Cardiovascular Disease

## 2023-02-16 NOTE — Telephone Encounter (Signed)
Good Morning,   Please advise if ok to refill this prescription under Dr. Rockey Situ as the prescribing physician once an appointment has been established? Medication is listed under historical provider and the patient has last seen Dr. Rockey Situ in 11/2021. Thank you.

## 2023-02-16 NOTE — Telephone Encounter (Signed)
LVM to schedule

## 2023-02-16 NOTE — Telephone Encounter (Signed)
Hi Lauren,  Could you schedule this patient a follow up visit? The patient was last seen by Dr. Rockey Situ on 11/2021. Thank you so much.

## 2023-02-20 NOTE — Telephone Encounter (Signed)
Good Morning,  Please advise if ok to refill this prescription under Dr. Rockey Situ as the prescribing physician? The patient has an appointment with Dr. Rockey Situ on 05/01/23. She was last seen by Dr. Rockey Situ on 11/2021. The medication is currently listed under historical provider.  Thank you.

## 2023-02-28 ENCOUNTER — Ambulatory Visit (INDEPENDENT_AMBULATORY_CARE_PROVIDER_SITE_OTHER): Payer: PPO | Admitting: Obstetrics & Gynecology

## 2023-02-28 ENCOUNTER — Other Ambulatory Visit (HOSPITAL_COMMUNITY)
Admission: RE | Admit: 2023-02-28 | Discharge: 2023-02-28 | Disposition: A | Discharge status: Home / Self Care | Payer: PPO | Source: Ambulatory Visit | Attending: Obstetrics & Gynecology | Admitting: Obstetrics & Gynecology

## 2023-02-28 ENCOUNTER — Encounter: Payer: Self-pay | Admitting: Obstetrics & Gynecology

## 2023-02-28 VITALS — BP 128/75 | HR 65 | Wt 149.0 lb

## 2023-02-28 DIAGNOSIS — N9089 Other specified noninflammatory disorders of vulva and perineum: Secondary | ICD-10-CM | POA: Insufficient documentation

## 2023-02-28 DIAGNOSIS — L900 Lichen sclerosus et atrophicus: Secondary | ICD-10-CM

## 2023-02-28 NOTE — Progress Notes (Signed)
GYNECOLOGY OFFICE VISIT NOTE  History:   Susan [name] is a 72 y.o. G0P0000 here today for recurrent vulvar irritation. 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  Vulvar biopsy was done but unfortunately pathology analysis showed no skin was seen on sample but had benign unremarkable subcutaneous fibroadipose tissue. Patient's symptoms improved on the Clobetasol course and she tapered off as instructed.  She reported that her symptoms restarted  a couple of months ago and she tried Clobetasol twice a week but this did not help much. Also tried OTC antifungal creams and diaper rash ointment (she reports mild incontinence issues which she thought exacerbated this situation), but there was no relief. She is here today for repeat evaluation.  No lesions seen on self-evaluation, just diffuse redness on her external vulva extending to around her anal area which is very irritating.  She denies any abnormal vaginal discharge, bleeding, pelvic pain or other concerns.    Past Medical History:  Diagnosis Date   Actinic keratosis    Arthritis    Basal cell carcinoma 05/26/2016   R chest parasternal   Basal cell carcinoma 01/21/2015   L nose supratip    Breast cancer (HCC)    Left DCIS   Cancer (HCC)    Complication of anesthesia    Dysrhythmia    PSVT on metoprolol   Family history of breast cancer    Gastric reflux 90's   GERD (gastroesophageal reflux disease)    Headache    H/O MIGRAINES   Hiatal hernia 2003   History of Coronary CT for Calcium Scoring    a. 11/2018 CT Cor Ca2+ = Zero.   Hypertension    Mitral regurgitation    a. 03/2017 Echo: EF 55-60%, no nrwma, Mod MR; b. 03/2018 Echo: EF 60-65%, no rwma. Gr2 DD. Mild MV prolapse of ant leaflet w/ mild to mod mR. Nl RV fxn. Mild to mod TR.   Personal history of radiation therapy 2019   LEFT lumpectomy   PONV (postoperative nausea and vomiting)    N/V   PSVT  (paroxysmal supraventricular tachycardia)    a. 03/2017 Event Monitor: Avg HR 66, 10 episodes of SVT up to 42.8 secs, fastest 148 bpm. 8 beats WCT (179 bpm) - VT vs SVT w/ aberrancy. Rare PACs (2.6% total beats).   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   R prox lat bicep    Past Surgical History:  Procedure Laterality Date   AUGMENTATION MAMMAPLASTY Bilateral 01/17/2020   BREAST BIOPSY Left 06/13/2018   Affirm Bx- coil clip   atypical sclerosing and DCIS   BREAST ENHANCEMENT SURGERY Bilateral 01/17/2020   Procedure: MAMMOPLASTY AUGMENTATION WITH PROSTHESIS (BREAST);  Surgeon: Cindra Presume, MD;  Location: [place];  Service: Plastics;  Laterality: Bilateral;  2.5 hours for total case   BREAST EXCISIONAL BIOPSY Right 2000   benign   BREAST LUMPECTOMY WITH NEEDLE LOCALIZATION Left 06/26/2018   Procedure: BREAST LUMPECTOMY WITH NEEDLE LOCALIZATION;  Surgeon: Jules Husbands, MD;  Location: [facility];  Service: General;  Laterality: Left;   CHOLECYSTECTOMY     OVARIAN CYST SURGERY     SCAR REVISION Left 01/17/2020   Procedure: POSSIBLE SCAR REVISION TO LEFT BREAST;  Surgeon: Cindra Presume, MD;  Location: [place];  Service: Plastics;  Laterality: Left;   SKIN FULL THICKNESS GRAFT  01/17/2020   Procedure: SKIN GRAFT FULL THICKNESS;  Surgeon:  Cindra Presume, MD;  Location: [place];  Service: Plastics;;   TONSILLECTOMY     age 68   UPPER GI ENDOSCOPY  2003   The following portions of the patient's history were reviewed and updated as appropriate: allergies, current medications, past family history, past medical history, past social history, past surgical history and problem list.   Review of Systems:  Pertinent items noted in HPI and remainder of comprehensive ROS otherwise negative.  Physical Exam:  BP 128/75   Pulse 65   Wt 149 lb (67.6 kg)   BMI 23.34 kg/m  CONSTITUTIONAL: Well-developed, well-nourished female in no acute  distress.  SKIN: No rash noted. Not diaphoretic. No erythema. No pallor. MUSCULOSKELETAL: Normal range of motion. No edema noted. NEUROLOGIC: Alert and oriented to person, place, and time. Normal muscle tone coordination. No cranial nerve deficit noted. PSYCHIATRIC: Normal mood and affect. Normal behavior. Normal judgment and thought content. CARDIOVASCULAR: Normal heart rate noted RESPIRATORY: Effort and breath sounds normal, no problems with respiration noted ABDOMEN: No masses noted. No other overt distention noted.   PELVIC: Mild erythema noted diffusely on external labia extending to perianal region. Loss of vulvar architecture noted diffusely, significant atrophy of bilateral labia with some hypopigmentation.  Scant clear discharge seen.  Mild fissure seen in posterior fourchette, tender to touch.  See pictures below.    VULVAR BIOPSY NOTE The indications for repeat vulvar biopsy (rule out neoplasia, establish lichen sclerosus diagnosis) were reviewed.   Risks of the biopsy including pain, bleeding, infection, inadequate specimen, scarring and need for additional procedures  were discussed. The patient stated understanding and agreed to undergo procedure today. Consent was signed,  time out performed.  RN Chaperone was present during entire procedure. The patient's vulva was prepped with Betadine. 1% lidocaine was injected into area of inner right labium about 1 cm above posterior fourchette.  Two side-by-side 3-mm punch biopsies was obtained with care given to get skin samples, biopsy tissue was picked up with sterile forceps and sterile scissors were used to excise the lesion.  Small bleeding was noted and hemostasis was achieved using two interrupted 4-0 Vicryl sutures.  The patient tolerated the procedure well.  Post-procedure instructions  (pelvic rest for one week) were given to the patient. The patient is to call with heavy bleeding, fever greater than 100.4, foul smelling vaginal discharge  or other concerns. The patient will be contacted with results.  Labs: 08/23/2022   VULVA BIOPSY  - Benign unremarkable subcutaneous fibroadipose tissue with scant skeletal muscle  - Skin is not identified    Assessment and Plan:     1. Vulvar irritation 2. 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  - Surgical pathology She was instructed to re-start Clobetasol therapy daily in about three days (allow for biopsy site to heal a little before treatment).  She has refills. Was instructed to apply to affected area every night for 4 weeks, then every other day for 4 weeks and then twice a week for 4 weeks or until resolution. Routine preventative health maintenance measures emphasized.   Return in about 5 weeks (around 04/04/2023) for follow up vulvar irritation.    I spent 30 minutes dedicated to the care of this patient including pre-visit review of records, face to face time with the patient discussing her conditions and treatments and post visit orders.    Verita Schneiders, MD, [place] [facility]  [facility], [facility]

## 2023-03-01 ENCOUNTER — Encounter: Payer: Self-pay | Admitting: Obstetrics & Gynecology

## 2023-03-03 LAB — SURGICAL PATHOLOGY

## 2023-04-04 ENCOUNTER — Encounter: Payer: Self-pay | Admitting: Obstetrics & Gynecology

## 2023-04-04 ENCOUNTER — Ambulatory Visit (INDEPENDENT_AMBULATORY_CARE_PROVIDER_SITE_OTHER): Payer: PPO | Admitting: Obstetrics & Gynecology

## 2023-04-04 VITALS — BP 112/70 | HR 66

## 2023-04-04 DIAGNOSIS — L900 Lichen sclerosus et atrophicus: Secondary | ICD-10-CM | POA: Insufficient documentation

## 2023-04-04 NOTE — Progress Notes (Signed)
GYNECOLOGY OFFICE VISIT NOTE  History:   Susan [name] is a 72 y.o. G0P0000 here today for follow up for lichen sclerosus et atrophicus.  Started on Clobetasol course, reports marked improvement in symptoms.  She denies any abnormal vaginal discharge, bleeding, pelvic pain or other concerns.    Past Medical History:  Diagnosis Date   Actinic keratosis    Arthritis    Basal cell carcinoma 05/26/2016   R chest parasternal   Basal cell carcinoma 01/21/2015   L nose supratip    Breast cancer (HCC)    Left DCIS   Cancer (HCC)    Complication of anesthesia    Dysrhythmia    PSVT on metoprolol   Family history of breast cancer    Gastric reflux 90's   GERD (gastroesophageal reflux disease)    Headache    H/O MIGRAINES   Hiatal hernia 2003   History of Coronary CT for Calcium Scoring    a. 11/2018 CT Cor Ca2+ = Zero.   Hypertension    Mitral regurgitation    a. 03/2017 Echo: EF 55-60%, no nrwma, Mod MR; b. 03/2018 Echo: EF 60-65%, no rwma. Gr2 DD. Mild MV prolapse of ant leaflet w/ mild to mod mR. Nl RV fxn. Mild to mod TR.   Personal history of radiation therapy 2019   LEFT lumpectomy   PONV (postoperative nausea and vomiting)    N/V   PSVT (paroxysmal supraventricular tachycardia)    a. 03/2017 Event Monitor: Avg HR 66, 10 episodes of SVT up to 42.8 secs, fastest 148 bpm. 8 beats WCT (179 bpm) - VT vs SVT w/ aberrancy. Rare PACs (2.6% total beats).   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   R prox lat bicep    Past Surgical History:  Procedure Laterality Date   AUGMENTATION MAMMAPLASTY Bilateral 01/17/2020   BREAST BIOPSY Left 06/13/2018   Affirm Bx- coil clip   atypical sclerosing and DCIS   BREAST ENHANCEMENT SURGERY Bilateral 01/17/2020   Procedure: MAMMOPLASTY AUGMENTATION WITH PROSTHESIS (BREAST);  Surgeon: Allena NapoleonPace, Collier S, MD;  Location: [facility];  Service: Plastics;  Laterality: Bilateral;  2.5 hours for total case   BREAST EXCISIONAL BIOPSY  Right 2000   benign   BREAST LUMPECTOMY WITH NEEDLE LOCALIZATION Left 06/26/2018   Procedure: BREAST LUMPECTOMY WITH NEEDLE LOCALIZATION;  Surgeon: Leafy RoPabon, Diego F, MD;  Location: [facility];  Service: General;  Laterality: Left;   CHOLECYSTECTOMY     OVARIAN CYST SURGERY     SCAR REVISION Left 01/17/2020   Procedure: POSSIBLE SCAR REVISION TO LEFT BREAST;  Surgeon: Allena NapoleonPace, Collier S, MD;  Location: [facility];  Service: Plastics;  Laterality: Left;   SKIN FULL THICKNESS GRAFT  01/17/2020   Procedure: SKIN GRAFT FULL THICKNESS;  Surgeon: Allena NapoleonPace, Collier S, MD;  Location: [facility];  Service: Plastics;;   TONSILLECTOMY     age 72   UPPER GI ENDOSCOPY  2003   The following portions of the patient's history were reviewed and updated as appropriate: allergies, current medications, past family history, past medical history, past social history, past surgical history and problem list.   Review of Systems:  Pertinent items noted in HPI and remainder of comprehensive ROS otherwise negative.  Physical Exam:  BP 112/70   Pulse 66  CONSTITUTIONAL: Well-developed, well-nourished female in no acute distress.  SKIN: No rash noted. Not diaphoretic. No erythema. No pallor. MUSCULOSKELETAL: Normal range of motion. No edema noted. NEUROLOGIC: Alert and oriented  to person, place, and time. Normal muscle tone coordination. No cranial nerve deficit noted. PSYCHIATRIC: Normal mood and affect. Normal behavior. Normal judgment and thought content. CARDIOVASCULAR: Normal heart rate noted RESPIRATORY: Effort and breath sounds normal, no problems with respiration noted ABDOMEN: No masses noted. No other overt distention noted. PELVIC: Scant erythema noted, marked improvement noted overall. No lesions. Done in presence of chaperone.     Assessment and Plan:     1.Lichen sclerosus et atrophicus Reassured by her improvement. Continue Clobetasol therapy as prescribed. She will let us  know if symptoms worsen.   Return for any gynecologic concerns.    I spent 15 minutes dedicated to the care of this patient including pre-visit review of records, face to face time with the patient discussing her conditions and treatments and post visit orders.    Jaynie Collins, MD, FACOG Obstetrician & Gynecologist, [facility] for [employer], [facility]

## 2023-04-29 NOTE — Progress Notes (Unsigned)
cardiology Office Note  Date:  05/01/2023   ID:  Susan [name], DOB 05-Nov-1951, MRN [id number]  PCP:  Mort Sawyers, FNP   Chief Complaint  Patient presents with   12 month follow up     Patient occasionally will have fluttering in chest but not like before. Medications reviewed by the patient verbally.     HPI:  Susan [name] is a 72 y.o. female  Mild to moderate mitral valve regurgitation Nonsmoker Nondiabetic coronary CT for calcium scoring, revealing a calcium score of 0. History of nonsustained VT 9 beats seen on event monitor 10 runs of atrial tachycardia/supraventricular tachycardia on event monitor Who presents for follow-up of her arrhythmia  LOV 12/22 Lives at [facility] Stress, husband with beginning of cognitive issues More needy, no hobbies  she walks dog  Denies significant arrhythmia, has been doing well on very low-dose metoprolol succinate daily Did not tolerate propranolol for tremor, was weak Changed to  topiramate, back to metoprolol  At baseline is active  Prior monitor reviewed,zio,   tachycardia lasting less than 1 minute no more than 1 episode per day  EKG personally reviewed by myself on todays visit Normal sinus rhythm rate 68 bpm no significant ST-T wave changes  Other past medical history reviewed Last seen in clinic November 2019 Breast CA XRT finished sept 6th 2019 Left breast lumpectomy defect, right breast asymmetry Underwent breast surgery January 2021  echocardiogram April 2019 Mild to moderate MR April 2019   Long-term monitor 04/25/2017: Overall rhythm was sinus.   One run of slightly wide complex tachycardia, 8 beats at average of 179 BPM.    10 episodes of supraventricular tachycardia. Fastest was 16 seconds at 148 BPM. The longest was 42.8 seconds at 138 BPM. One was with a documented symptom. No evidence of significant pauses or AV block, or atrial fibrillation.   PMH:   has a past medical history of Actinic  keratosis, Arthritis, Basal cell carcinoma (05/26/2016), Basal cell carcinoma (01/21/2015), Breast cancer (HCC), Cancer (HCC), Complication of anesthesia, Dysrhythmia, Family history of breast cancer, Gastric reflux (90's), GERD (gastroesophageal reflux disease), Headache, Hiatal hernia (2003), History of Coronary CT for Calcium Scoring, Hypertension, Mitral regurgitation, Personal history of radiation therapy (2019), PONV (postoperative nausea and vomiting), PSVT (paroxysmal supraventricular tachycardia), and Squamous cell carcinoma of skin (10/31/2018).  PSH:    Past Surgical History:  Procedure Laterality Date   AUGMENTATION MAMMAPLASTY Bilateral 01/17/2020   BREAST BIOPSY Left 06/13/2018   Affirm Bx- coil clip   atypical sclerosing and DCIS   BREAST ENHANCEMENT SURGERY Bilateral 01/17/2020   Procedure: MAMMOPLASTY AUGMENTATION WITH PROSTHESIS (BREAST);  Surgeon: Allena Napoleon, MD;  Location: [facility];  Service: Plastics;  Laterality: Bilateral;  2.5 hours for total case   BREAST EXCISIONAL BIOPSY Right 2000   benign   BREAST LUMPECTOMY WITH NEEDLE LOCALIZATION Left 06/26/2018   Procedure: BREAST LUMPECTOMY WITH NEEDLE LOCALIZATION;  Surgeon: Leafy Ro, MD;  Location: [facility];  Service: General;  Laterality: Left;   CHOLECYSTECTOMY     OVARIAN CYST SURGERY     SCAR REVISION Left 01/17/2020   Procedure: POSSIBLE SCAR REVISION TO LEFT BREAST;  Surgeon: Allena Napoleon, MD;  Location: [facility];  Service: Plastics;  Laterality: Left;   SKIN FULL THICKNESS GRAFT  01/17/2020   Procedure: SKIN GRAFT FULL THICKNESS;  Surgeon: Allena Napoleon, MD;  Location: [facility];  Service: Plastics;;   TONSILLECTOMY     age 47  UPPER GI ENDOSCOPY  2003    Current Outpatient Medications  Medication Sig Dispense Refill   ALPRAZolam (XANAX) 0.5 MG tablet Take 1 tablet (0.5 mg total) by mouth at bedtime as needed. for sleep 30 tablet 0   calcium  citrate-vitamin D 500-500 MG-UNIT chewable tablet Chew 1 tablet by mouth 2 (two) times daily.     cholecalciferol (VITAMIN D) 1000 units tablet Take 1,000 Units by mouth daily.     clobetasol ointment (TEMOVATE) 0.05 % Apply to affected area every night for 4 weeks, then every other day for 4 weeks and then twice a week for 4 weeks or until resolution. 30 g 5   famotidine (PEPCID) 20 MG tablet Take 20 mg by mouth as needed for heartburn or indigestion.     fluticasone (FLONASE) 50 MCG/ACT nasal spray Place 2 sprays into both nostrils as needed.      Ivermectin 1 % CREA Apply 1 Application topically at bedtime. 30 g 11   meloxicam (MOBIC) 15 MG tablet Take 1 tablet (15 mg total) by mouth daily. 90 tablet 1   metoprolol succinate (TOPROL-XL) 25 MG 24 hr tablet TAKE 1/2 TABLET BY MOUTH EVERY DAY 45 tablet 1   Multiple Vitamin (MULTIVITAMIN) tablet Take 1 tablet by mouth daily.     psyllium (REGULOID) 0.52 g capsule Take 0.52 g by mouth 3 (three) times daily.      SIMETHICONE-80 PO Take 80 mg by mouth as needed (gas).      tamoxifen (NOLVADEX) 20 MG tablet TAKE 1 TABLET BY MOUTH EVERY DAY 90 tablet 3   topiramate (TOPAMAX) 25 MG tablet Take 1 tablet (25 mg total) by mouth 2 (two) times daily. 180 tablet 1   No current facility-administered medications for this visit.    Allergies:   Patient has no known allergies.   Social History:  The patient  reports that she has never smoked. She has never used smokeless tobacco. She reports current alcohol use of about 2.0 standard drinks of alcohol per week. She reports that she does not use drugs.   Family History:   family history includes Breast cancer (age of onset: 45) in her cousin; Breast cancer (age of onset: 34) in her cousin; Breast cancer (age of onset: 65) in her cousin and cousin; COPD in her mother; Healthy in her brother; Heart failure in her mother; Hyperlipidemia in her mother; Hypertension in her brother, father, mother, and sister; Melanoma  in her paternal uncle; Stroke (age of onset: 92) in her father; Testicular cancer (age of onset: 42) in an other family member.   Review of Systems: Review of Systems  Constitutional: Negative.   HENT: Negative.    Respiratory: Negative.    Cardiovascular: Negative.   Gastrointestinal: Negative.   Musculoskeletal: Negative.   Neurological: Negative.   Psychiatric/Behavioral: Negative.    All other systems reviewed and are negative.  PHYSICAL EXAM: VS:  BP 120/74 (BP Location: Left Arm, Patient Position: Sitting, Cuff Size: Normal)   Pulse 68   Ht 5' 7.5" (1.715 m)   Wt 144 lb 8 oz (65.5 kg)   SpO2 97%   BMI 22.30 kg/m  , BMI Body mass index is 22.3 kg/m. Constitutional:  oriented to person, place, and time. No distress.  HENT:  Head: Grossly normal Eyes:  no discharge. No scleral icterus.  Neck: No JVD, no carotid bruits  Cardiovascular: Regular rate and rhythm, no murmurs appreciated Pulmonary/Chest: Clear to auscultation bilaterally, no wheezes or rails Abdominal: Soft.  no distension.  no tenderness.  Musculoskeletal: Normal range of motion Neurological:  normal muscle tone. Coordination normal. No atrophy Skin: Skin warm and dry Psychiatric: normal affect, pleasant  Recent Labs: 08/12/2022: ALT 18; BUN 18; Creatinine, Ser 0.83; Hemoglobin 14.4; Platelets 273.0; Potassium 4.5; Sodium 139    Lipid Panel Lab Results  Component Value Date   CHOL 164 08/12/2022   HDL 54.20 08/12/2022   LDLCALC 87 08/12/2022   TRIG 113.0 08/12/2022      Wt Readings from Last 3 Encounters:  05/01/23 144 lb 8 oz (65.5 kg)  02/28/23 149 lb (67.6 kg)  01/30/23 145 lb (65.8 kg)     ASSESSMENT AND PLAN:  Essential hypertension Blood pressure is well controlled on today's visit. No changes made to the medications.  Palpitations  continue metoprolol succinate 12.5 mg daily, symptoms are stable extra metoprolol succinate 12.5 mg as needed for breakthrough tachycardia  Atrial  tachycardia (HCC) - Plan: EKG 12-Lead Continue metoprolol as above, did not tolerate propranolol as needed (weakness)  Non-rheumatic mitral regurgitation Previously with mild to moderate MR April 2019 No symptoms concerning for progression,  No significant murmur on exam  Run of nonsustained VT on ZIO Continue low-dose metoprolol  no near-syncope or syncope  Breast cancer with radiation Completed bilateral surgery for cosmetic reasons January 2021  Preventive care CT coronary calcium score performed several years ago with score of 0   Total encounter time more than 30 minutes  Greater than 50% was spent in counseling and coordination of care with the patient    Orders Placed This Encounter  Procedures   EKG 12-Lead     Signed, Dossie Arbour, M.D., Ph.D. 05/01/2023  [facility] [place], [place] [phone]

## 2023-05-01 ENCOUNTER — Ambulatory Visit: Payer: PPO | Attending: Cardiovascular Disease | Admitting: Cardiovascular Disease

## 2023-05-01 ENCOUNTER — Encounter: Payer: Self-pay | Admitting: Cardiovascular Disease

## 2023-05-01 VITALS — BP 120/74 | HR 68 | Ht 67.5 in | Wt 144.5 lb

## 2023-05-01 DIAGNOSIS — I471 Supraventricular tachycardia, unspecified: Secondary | ICD-10-CM | POA: Diagnosis not present

## 2023-05-01 DIAGNOSIS — I10 Essential (primary) hypertension: Secondary | ICD-10-CM | POA: Diagnosis not present

## 2023-05-01 DIAGNOSIS — E782 Mixed hyperlipidemia: Secondary | ICD-10-CM

## 2023-05-01 DIAGNOSIS — I4719 Other supraventricular tachycardia: Secondary | ICD-10-CM | POA: Diagnosis not present

## 2023-05-01 DIAGNOSIS — R002 Palpitations: Secondary | ICD-10-CM

## 2023-05-01 DIAGNOSIS — I340 Nonrheumatic mitral (valve) insufficiency: Secondary | ICD-10-CM | POA: Diagnosis not present

## 2023-05-01 NOTE — Patient Instructions (Signed)
Medication Instructions:  No changes  If you need a refill on your cardiac medications before your next appointment, please call your pharmacy.   Lab work: No new labs needed  Testing/Procedures: No new testing needed  Follow-Up: At [facility], you and your health needs are our priority.  As part of our continuing mission to provide you with exceptional heart care, we have created designated Provider Care Teams.  These Care Teams include your primary Cardiologist (physician) and Advanced Practice Providers (APPs -  Physician Assistants and Nurse Practitioners) who all work together to provide you with the care you need, when you need it.  You will need a follow up appointment in 12 months  Providers on your designated Care Team:   Christopher Berge, NP Ryan Dunn, PA- Cadence Furth, PA-C  COVID-19 Vaccine Information can be found at: [email or web address] For questions related to vaccine distribution or appointments, please email [email or web address] or call [phone].    

## 2023-05-09 DIAGNOSIS — H2513 Age-related nuclear cataract, bilateral: Secondary | ICD-10-CM | POA: Diagnosis not present

## 2023-05-09 DIAGNOSIS — H04123 Dry eye syndrome of bilateral lacrimal glands: Secondary | ICD-10-CM | POA: Diagnosis not present

## 2023-05-29 ENCOUNTER — Ambulatory Visit
Admission: RE | Admit: 2023-05-29 | Discharge: 2023-05-29 | Disposition: A | Discharge status: Home / Self Care | Payer: PPO | Source: Ambulatory Visit | Attending: Family | Admitting: Family

## 2023-05-29 ENCOUNTER — Ambulatory Visit (INDEPENDENT_AMBULATORY_CARE_PROVIDER_SITE_OTHER): Payer: PPO | Admitting: Family

## 2023-05-29 VITALS — BP 142/98 | HR 76 | Temp 97.6°F | Ht 67.5 in | Wt 145.6 lb

## 2023-05-29 DIAGNOSIS — N83292 Other ovarian cyst, left side: Secondary | ICD-10-CM | POA: Insufficient documentation

## 2023-05-29 DIAGNOSIS — N838 Other noninflammatory disorders of ovary, fallopian tube and broad ligament: Secondary | ICD-10-CM | POA: Diagnosis not present

## 2023-05-29 DIAGNOSIS — G8929 Other chronic pain: Secondary | ICD-10-CM | POA: Diagnosis not present

## 2023-05-29 DIAGNOSIS — N83291 Other ovarian cyst, right side: Secondary | ICD-10-CM | POA: Insufficient documentation

## 2023-05-29 DIAGNOSIS — M545 Low back pain, unspecified: Secondary | ICD-10-CM | POA: Diagnosis not present

## 2023-05-29 DIAGNOSIS — I700 Atherosclerosis of aorta: Secondary | ICD-10-CM | POA: Insufficient documentation

## 2023-05-29 LAB — URINALYSIS, ROUTINE W REFLEX MICROSCOPIC
Bilirubin Urine: NEGATIVE
Hgb urine dipstick: NEGATIVE
Ketones, ur: NEGATIVE
Nitrite: NEGATIVE
Specific Gravity, Urine: 1.015 (ref 1.000–1.030)
Total Protein, Urine: NEGATIVE
Urine Glucose: NEGATIVE
Urobilinogen, UA: 0.2 (ref 0.0–1.0)
pH: 7 (ref 5.0–8.0)

## 2023-05-29 LAB — POCT I-STAT CREATININE: Creatinine, Ser: 0.8 mg/dL (ref 0.44–1.00)

## 2023-05-29 MED ORDER — IOHEXOL 300 MG/ML  SOLN
80.0000 mL | Freq: Once | INTRAMUSCULAR | Status: AC | PRN
  Administered 2023-05-29: 80 mL via INTRAVENOUS

## 2023-05-29 NOTE — Assessment & Plan Note (Signed)
Acute on chronic.  Pain acutely worsened.  Pain intensity reminds her of previous history ovarian cyst which ruptured.  She has a history of breast cancer .2 previous visits recently.  We discussed proceeding with more thorough investigation to exclude malignancy with CT abdomen and pelvis.  Exam today does raise concern for left hip etiology.  Pending lumbar x-ray, bilateral hip x-ray.  Advised patient that she could increase meloxicam from 7.5 mg to 15 mg as needed for more severe pain days.  We discussed muscle relaxants, tramadol once results have returned.

## 2023-05-29 NOTE — Progress Notes (Signed)
Assessment & Plan:  Chronic midline low back pain without sciatica Assessment & Plan: Acute on chronic.  Pain acutely worsened.  Pain intensity reminds her of previous history ovarian cyst which ruptured.  She has a history of breast cancer .2 previous visits recently.  We discussed proceeding with more thorough investigation to exclude malignancy with CT abdomen and pelvis.  Exam today does raise concern for left hip etiology.  Pending lumbar x-ray, bilateral hip x-ray.  Advised patient that she could increase meloxicam from 7.5 mg to 15 mg as needed for more severe pain days.  We discussed muscle relaxants, tramadol once results have returned.  Orders: -     DG Lumbar Spine Complete; Future -     DG HIPS BILAT W OR W/O PELVIS 3-4 VIEWS; Future -     CT ABDOMEN PELVIS W WO CONTRAST; Future -     Urinalysis, Routine w reflex microscopic -     Urine Culture     Return precautions given.   Risks, benefits, and alternatives of the medications and treatment plan prescribed today were discussed, and patient expressed understanding.   Education regarding symptom management and diagnosis given to patient on AVS either electronically or printed.  No follow-ups on file.  Rennie Plowman, FNP  Subjective:    Patient ID: Susan [name], female    DOB: 02/23/1951, 72 y.o.   MRN: [id number]  CC: Susan [name] is a 72 y.o. female who presents today for an acute visit.    HPI: Complains of acute episode of low back pain and left hip x 3 days Pain this morning 10/10. Bending forward aggravates pain.  She has felt pain radiate to the front of her abdomen.  She had back pain one year ago  Every morning she gets up and 'has low back pain'.  She did yard work , some Hospital doctor, 3 days ago. She sleeps with with wedge for reflux and she had thought that she 'slept wrong'.  Pain usually resolves with mobic 7.5mg  3 days ago, however pain didn't resolve.   She took tylenol 1000mg  at 2am this  morning without relief.   No unusual weight loss, night sweats, constipation, dysuria ,numbness in legs, saddle anesthesia, loss of bowl or urine.    No h/o seizure.    Physical therapy aggravated back pain Never smoker  She has h/o ovarian cyst in the her bedtimes this pain reminds her of that  H/o breast cancer, atrial tachycardia, BCC, osteopenia No ho ckd Previously saw Dr Patsy Lager 10/26/22 for back pain.  Referral to physical therapy.  She then saw Tabatha 01/17/2023 for back pain.  Started on meloxicam 7.5 mg QD Xr lumbar spine 01/19/23 with evidence of scoliosis, knee degenerative joint changes.  Grade 1 anterolisthesis  Colonoscopy and mammogram are up-to-date.  Mammogram is scheduled Allergies: Patient has no known allergies. Current Outpatient Medications on File Prior to Visit  Medication Sig Dispense Refill   ALPRAZolam (XANAX) 0.5 MG tablet Take 1 tablet (0.5 mg total) by mouth at bedtime as needed. for sleep 30 tablet 0   calcium citrate-vitamin D 500-500 MG-UNIT chewable tablet Chew 1 tablet by mouth 2 (two) times daily.     cholecalciferol (VITAMIN D) 1000 units tablet Take 1,000 Units by mouth daily.     clobetasol ointment (TEMOVATE) 0.05 % Apply to affected area every night for 4 weeks, then every other day for 4 weeks and then twice a week for 4 weeks or until resolution. 30 g  5   famotidine (PEPCID) 20 MG tablet Take 20 mg by mouth as needed for heartburn or indigestion.     fluticasone (FLONASE) 50 MCG/ACT nasal spray Place 2 sprays into both nostrils as needed.      Ivermectin 1 % CREA Apply 1 Application topically at bedtime. 30 g 11   meloxicam (MOBIC) 15 MG tablet Take 1 tablet (15 mg total) by mouth daily. 90 tablet 1   metoprolol succinate (TOPROL-XL) 25 MG 24 hr tablet TAKE 1/2 TABLET BY MOUTH EVERY DAY 45 tablet 1   Multiple Vitamin (MULTIVITAMIN) tablet Take 1 tablet by mouth daily.     psyllium (REGULOID) 0.52 g capsule Take 0.52 g by mouth 3 (three) times  daily.      SIMETHICONE-80 PO Take 80 mg by mouth as needed (gas).      tamoxifen (NOLVADEX) 20 MG tablet TAKE 1 TABLET BY MOUTH EVERY DAY 90 tablet 3   topiramate (TOPAMAX) 25 MG tablet Take 1 tablet (25 mg total) by mouth 2 (two) times daily. 180 tablet 1   No current facility-administered medications on file prior to visit.    Review of Systems  Constitutional:  Negative for chills, fever and unexpected weight change.  Respiratory:  Negative for cough.   Cardiovascular:  Negative for chest pain and palpitations.  Gastrointestinal:  Positive for abdominal pain. Negative for constipation, nausea and vomiting.  Musculoskeletal:  Positive for back pain.  Neurological:  Negative for numbness.      Objective:    BP (!) 142/98   Pulse 76   Temp 97.6 F (36.4 C) (Oral)   Ht 5' 7.5" (1.715 m)   Wt 145 lb 9.6 oz (66 kg)   SpO2 98%   BMI 22.47 kg/m   BP Readings from Last 3 Encounters:  05/29/23 (!) 142/98  05/01/23 120/74  04/04/23 112/70   Wt Readings from Last 3 Encounters:  05/29/23 145 lb 9.6 oz (66 kg)  05/01/23 144 lb 8 oz (65.5 kg)  02/28/23 149 lb (67.6 kg)    Physical Exam Vitals reviewed.  Constitutional:      Appearance: Normal appearance. She is well-developed.  Eyes:     Conjunctiva/sclera: Conjunctivae normal.  Cardiovascular:     Rate and Rhythm: Normal rate and regular rhythm.     Pulses: Normal pulses.     Heart sounds: Normal heart sounds.  Pulmonary:     Effort: Pulmonary effort is normal.     Breath sounds: Normal breath sounds. No wheezing, rhonchi or rales.  Abdominal:     General: Bowel sounds are normal. There is no distension.     Palpations: Abdomen is soft. Abdomen is not rigid. There is no fluid wave or mass.     Tenderness: There is no abdominal tenderness. There is no guarding or rebound.  Musculoskeletal:     Lumbar back: No swelling, edema, spasms, tenderness or bony tenderness. Normal range of motion.     Comments: Full range of  motion with flexion, tension, lateral side bends. Pain with forward flexion. No bony tenderness. No pain, numbness, tingling elicited with single leg raise bilaterally.  Bilateral Hip: No limp or waddling gait. Full ROM with flexion and hip rotation in flexion.    Pain of lateral hip with  (flexion-abduction-external rotation) test of left hip; no pain with right hip.   No pain with deep palpation of greater trochanter.     Skin:    General: Skin is warm and dry.  Neurological:  Mental Status: She is alert.     Sensory: No sensory deficit.     Deep Tendon Reflexes:     Reflex Scores:      Patellar reflexes are 2+ on the right side and 2+ on the left side.    Comments: Sensation and strength intact bilateral lower extremities.  Psychiatric:        Speech: Speech normal.        Behavior: Behavior normal.        Thought Content: Thought content normal.

## 2023-05-29 NOTE — Patient Instructions (Addendum)
Nice meeting you today.  Please go to [facility] to have lumbar and hip x-rays obtained.  Please keep phone with you so I can call you with any critical results with CT abdomen and pelvis.  If nothing critical, I will update you on MyChart instead.   As discussed, please monitor  blood pressure at home and me 5-6 reading on separate days. Goal is less than 120/80, based on newest guidelines, however we certainly want to be less than 130/80;  if persistently higher, please make sooner follow up appointment so we can recheck you blood pressure and manage/ adjust medications.

## 2023-05-31 ENCOUNTER — Encounter: Payer: Self-pay | Admitting: Obstetrics & Gynecology

## 2023-05-31 ENCOUNTER — Other Ambulatory Visit: Payer: Self-pay | Admitting: Family

## 2023-05-31 ENCOUNTER — Ambulatory Visit
Admission: RE | Admit: 2023-05-31 | Discharge: 2023-05-31 | Disposition: A | Discharge status: Home / Self Care | Payer: PPO | Source: Ambulatory Visit | Attending: Family | Admitting: Family

## 2023-05-31 ENCOUNTER — Telehealth: Payer: Self-pay | Admitting: Family

## 2023-05-31 ENCOUNTER — Encounter: Payer: Self-pay | Admitting: Family

## 2023-05-31 ENCOUNTER — Other Ambulatory Visit: Payer: Self-pay | Admitting: Obstetrics & Gynecology

## 2023-05-31 DIAGNOSIS — R9389 Abnormal findings on diagnostic imaging of other specified body structures: Secondary | ICD-10-CM | POA: Diagnosis not present

## 2023-05-31 DIAGNOSIS — I700 Atherosclerosis of aorta: Secondary | ICD-10-CM | POA: Insufficient documentation

## 2023-05-31 DIAGNOSIS — N3000 Acute cystitis without hematuria: Secondary | ICD-10-CM

## 2023-05-31 DIAGNOSIS — D259 Leiomyoma of uterus, unspecified: Secondary | ICD-10-CM | POA: Diagnosis not present

## 2023-05-31 DIAGNOSIS — N83201 Unspecified ovarian cyst, right side: Secondary | ICD-10-CM

## 2023-05-31 DIAGNOSIS — N83209 Unspecified ovarian cyst, unspecified side: Secondary | ICD-10-CM | POA: Insufficient documentation

## 2023-05-31 LAB — URINE CULTURE: SPECIMEN QUALITY:: ADEQUATE

## 2023-05-31 MED ORDER — AMOXICILLIN-POT CLAVULANATE 875-125 MG PO TABS
1.0000 | ORAL_TABLET | Freq: Two times a day (BID) | ORAL | 0 refills | Status: DC
Start: 2023-05-31 — End: 2023-06-01

## 2023-05-31 NOTE — Progress Notes (Unsigned)
MR of pelvis ordered for complex ovarian cyst seen on recent scan.  Will follow up results and manage accordingly.   Jaynie Collins, MD

## 2023-05-31 NOTE — Telephone Encounter (Signed)
Tabitha Pt would like to transfer of care due to proximity. Is this okay with you? I saw for acute visit this week     Telephone note below  Spoke with pt regarding CT a/p.  No evidence of metastatic disease.  Thickened endometrium and right adnexal mass.  Simple 2.6 cm right adnexal cyst and simple 1.5 cm left adnexal cyst.   She is afebrile. She has episodic low back pain, throbbing. Low back pain has improved overall.  She is taking tylenol.   Right oophorectomy after ovarian rupture 1970  Denies vaginal bleeding  She is compliant with tamoxifen  Biopsy 04/04/23  for lichen sclerosis , Dr Jaynie Collins  Negative cervical biopsy 08/19/21  Plan:  Start augmentin for e coli greater than 100,000 CFU in urine Order transvaginal ultrasound  Patient will call GYN to schedule follow-up to discuss transvaginal ultrasound results, need for endometrial biopsy  F/u appt with me for aortic atherosclerosis,TOC

## 2023-05-31 NOTE — Telephone Encounter (Signed)
Yes this is completely understandable. I am good with TOC.

## 2023-06-01 ENCOUNTER — Other Ambulatory Visit: Payer: PPO

## 2023-06-01 ENCOUNTER — Other Ambulatory Visit: Payer: Self-pay | Admitting: Family

## 2023-06-01 ENCOUNTER — Ambulatory Visit (INDEPENDENT_AMBULATORY_CARE_PROVIDER_SITE_OTHER): Payer: PPO

## 2023-06-01 ENCOUNTER — Telehealth: Payer: Self-pay | Admitting: *Deleted

## 2023-06-01 DIAGNOSIS — G8929 Other chronic pain: Secondary | ICD-10-CM

## 2023-06-01 DIAGNOSIS — N3000 Acute cystitis without hematuria: Secondary | ICD-10-CM

## 2023-06-01 DIAGNOSIS — M545 Low back pain, unspecified: Secondary | ICD-10-CM

## 2023-06-01 DIAGNOSIS — M4126 Other idiopathic scoliosis, lumbar region: Secondary | ICD-10-CM | POA: Diagnosis not present

## 2023-06-01 DIAGNOSIS — M25552 Pain in left hip: Secondary | ICD-10-CM | POA: Diagnosis not present

## 2023-06-01 LAB — URINE CULTURE: MICRO NUMBER:: 15032988

## 2023-06-01 MED ORDER — NITROFURANTOIN MONOHYD MACRO 100 MG PO CAPS
100.0000 mg | ORAL_CAPSULE | Freq: Two times a day (BID) | ORAL | 0 refills | Status: DC
Start: 2023-06-01 — End: 2023-07-04

## 2023-06-01 NOTE — Telephone Encounter (Signed)
I called the patient and let her know that she should stop tamoxifen and she will get MRI to check about it. The pt said that she has appt 6/24.

## 2023-06-01 NOTE — Telephone Encounter (Signed)
Spoke to pt and scheduled her an appt for 06/05/23 to discuss Atherosclerosis and TOC

## 2023-06-05 ENCOUNTER — Encounter: Payer: Self-pay | Admitting: Family

## 2023-06-05 ENCOUNTER — Ambulatory Visit (INDEPENDENT_AMBULATORY_CARE_PROVIDER_SITE_OTHER): Payer: PPO | Admitting: Family

## 2023-06-05 VITALS — BP 130/76 | HR 76 | Temp 98.2°F | Ht 68.0 in | Wt 143.0 lb

## 2023-06-05 DIAGNOSIS — L603 Nail dystrophy: Secondary | ICD-10-CM

## 2023-06-05 DIAGNOSIS — I700 Atherosclerosis of aorta: Secondary | ICD-10-CM

## 2023-06-05 LAB — B12 AND FOLATE PANEL
Folate: >23.8 ng/mL (ref >5.9)
Vitamin B-12: 454 pg/mL (ref 211–911)

## 2023-06-05 NOTE — Progress Notes (Signed)
Assessment & Plan:  Atherosclerosis of aorta [facility]) Assessment & Plan: The 10-year ASCVD risk score (Sarthak Rubenstein DK, et al., 2019) is: 15.3% CT calcium score 0 ,12/03/2018 LDL 87   Discussed with patient overall reassuring send CT calcium score, serum cholesterol however ASCVD is elevated. Father had a CVA.  She also cardiology and have sent Dr. Mariah Milling a staff message in regards to his thoughts on starting a statin medication in the setting atherosclerosis, updating CT calcium score.    Brittle nails Assessment & Plan: Pending labs.  Advised her to discuss symptom at follow up with dermatology in September  Orders: -     B12 and Folate Panel -     Iron, TIBC and Ferritin Panel     Return precautions given.   Risks, benefits, and alternatives of the medications and treatment plan prescribed today were discussed, and patient expressed understanding.   Education regarding symptom management and diagnosis given to patient on AVS either electronically or printed.  Return in about 4 months (around 10/05/2023).  Rennie Plowman, FNP  Subjective:    Patient ID: Susan [name], female    DOB: March 31, 1951, 72 y.o.   MRN: [id number]  CC: Susan [name] is a 72 y.o. female who presents today for follow up.   HPI: Here to discuss atherosclerosis.     She complains of  thin, brittle finger nails. She is taking Hair Skin and Nails without improvement.  No discoloration of the nails She is scheduled to see dermatology in September, Dr Gwen Pounds  BP at home 122/75, 118/73, 103/64, 122/72.   Follow up Dr Macon Large 07/04/23, Dr Smith Robert 06/30/23 MR pelvis 06/19/23  Endometrial biopsy 08/19/21 negative  H/o atrial tachycardia. She is comliant with metropolol 25mg    CT calcium score 0 ,12/03/2018  LDL 87  Allergies: Patient has no known allergies. Current Outpatient Medications on File Prior to Visit  Medication Sig Dispense Refill   ALPRAZolam (XANAX) 0.5 MG tablet Take 1 tablet (0.5 mg total) by  mouth at bedtime as needed. for sleep 30 tablet 0   calcium citrate-vitamin D 500-500 MG-UNIT chewable tablet Chew 1 tablet by mouth 2 (two) times daily.     cholecalciferol (VITAMIN D) 1000 units tablet Take 1,000 Units by mouth daily.     clobetasol ointment (TEMOVATE) 0.05 % Apply to affected area every night for 4 weeks, then every other day for 4 weeks and then twice a week for 4 weeks or until resolution. 30 g 5   famotidine (PEPCID) 20 MG tablet Take 20 mg by mouth as needed for heartburn or indigestion.     fluticasone (FLONASE) 50 MCG/ACT nasal spray Place 2 sprays into both nostrils as needed.      Ivermectin 1 % CREA Apply 1 Application topically at bedtime. 30 g 11   meloxicam (MOBIC) 15 MG tablet Take 1 tablet (15 mg total) by mouth daily. 90 tablet 1   metoprolol succinate (TOPROL-XL) 25 MG 24 hr tablet TAKE 1/2 TABLET BY MOUTH EVERY DAY 45 tablet 1   Multiple Vitamin (MULTIVITAMIN) tablet Take 1 tablet by mouth daily.     nitrofurantoin, macrocrystal-monohydrate, (MACROBID) 100 MG capsule Take 1 capsule (100 mg total) by mouth 2 (two) times daily. Take with food. 10 capsule 0   psyllium (REGULOID) 0.52 g capsule Take 0.52 g by mouth 3 (three) times daily.      SIMETHICONE-80 PO Take 80 mg by mouth as needed (gas).      tamoxifen (NOLVADEX) 20 MG tablet  TAKE 1 TABLET BY MOUTH EVERY DAY 90 tablet 3   topiramate (TOPAMAX) 25 MG tablet Take 1 tablet (25 mg total) by mouth 2 (two) times daily. 180 tablet 1   No current facility-administered medications on file prior to visit.    Review of Systems  Constitutional:  Negative for chills and fever.  Respiratory:  Negative for cough.   Cardiovascular:  Negative for chest pain and palpitations.  Gastrointestinal:  Negative for nausea and vomiting.      Objective:    BP 130/76   Pulse 76   Temp 98.2 F (36.8 C) (Oral)   Ht 5\' 8"  (1.727 m)   Wt 143 lb (64.9 kg)   SpO2 97%   BMI 21.74 kg/m  BP Readings from Last 3 Encounters:   06/05/23 130/76  05/29/23 (!) 142/98  05/01/23 120/74   Wt Readings from Last 3 Encounters:  06/05/23 143 lb (64.9 kg)  05/29/23 145 lb 9.6 oz (66 kg)  05/01/23 144 lb 8 oz (65.5 kg)    Physical Exam Vitals reviewed.  Constitutional:      Appearance: She is well-developed.  Eyes:     Conjunctiva/sclera: Conjunctivae normal.  Cardiovascular:     Rate and Rhythm: Normal rate and regular rhythm.     Pulses: Normal pulses.     Heart sounds: Normal heart sounds.  Pulmonary:     Effort: Pulmonary effort is normal.     Breath sounds: Normal breath sounds. No wheezing, rhonchi or rales.  Skin:    General: Skin is warm and dry.  Neurological:     Mental Status: She is alert.  Psychiatric:        Speech: Speech normal.        Behavior: Behavior normal.        Thought Content: Thought content normal.

## 2023-06-05 NOTE — Assessment & Plan Note (Addendum)
The 10-year ASCVD risk score (Susan [name] DK, et al., 2019) is: 15.3% CT calcium score 0 ,12/03/2018 LDL 87   Discussed with patient overall reassuring send CT calcium score, serum cholesterol however ASCVD is elevated. Father had a CVA.  She also cardiology and have sent Dr. Mariah [name] a staff message in regards to his thoughts on starting a statin medication in the setting atherosclerosis, updating CT calcium score.

## 2023-06-05 NOTE — Assessment & Plan Note (Signed)
Pending labs.  Advised her to discuss symptom at follow up with dermatology in September

## 2023-06-05 NOTE — Patient Instructions (Signed)
Low Back Sprain or Strain Rehab Ask your health care provider which exercises are safe for you. Do exercises exactly as told by your health care provider and adjust them as directed. It is normal to feel mild stretching, pulling, tightness, or discomfort as you do these exercises. Stop right away if you feel sudden pain or your pain gets worse. Do not begin these exercises until told by your health care provider. Stretching and range-of-motion exercises These exercises warm up your muscles and joints and improve the movement and flexibility of your back. These exercises also help to relieve pain, numbness, and tingling. Lumbar rotation  Lie on your back on a firm bed or the floor with your knees bent. Straighten your arms out to your sides so each arm forms a 90-degree angle (right angle) with a side of your body. Slowly move (rotate) both of your knees to one side of your body until you feel a stretch in your lower back (lumbar). Try not to let your shoulders lift off the floor. Hold this position for __________ seconds. Tense your abdominal muscles and slowly move your knees back to the starting position. Repeat this exercise on the other side of your body. Repeat __________ times. Complete this exercise __________ times a day. Single knee to chest  Lie on your back on a firm bed or the floor with both legs straight. Bend one of your knees. Use your hands to move your knee up toward your chest until you feel a gentle stretch in your lower back and buttock. Hold your leg in this position by holding on to the front of your knee. Keep your other leg as straight as possible. Hold this position for __________ seconds. Slowly return to the starting position. Repeat with your other leg. Repeat __________ times. Complete this exercise __________ times a day. Prone extension on elbows  Lie on your abdomen on a firm bed or the floor (prone position). Prop yourself up on your elbows. Use your arms  to help lift your chest up until you feel a gentle stretch in your abdomen and your lower back. This will place some of your body weight on your elbows. If this is uncomfortable, try stacking pillows under your chest. Your hips should stay down, against the surface that you are lying on. Keep your hip and back muscles relaxed. Hold this position for __________ seconds. Slowly relax your upper body and return to the starting position. Repeat __________ times. Complete this exercise __________ times a day. Strengthening exercises These exercises build strength and endurance in your back. Endurance is the ability to use your muscles for a long time, even after they get tired. Pelvic tilt This exercise strengthens the muscles that lie deep in the abdomen. Lie on your back on a firm bed or the floor with your legs extended. Bend your knees so they are pointing toward the ceiling and your feet are flat on the floor. Tighten your lower abdominal muscles to press your lower back against the floor. This motion will tilt your pelvis so your tailbone points up toward the ceiling instead of pointing to your feet or the floor. To help with this exercise, you may place a small towel under your lower back and try to push your back into the towel. Hold this position for __________ seconds. Let your muscles relax completely before you repeat this exercise. Repeat __________ times. Complete this exercise __________ times a day. Alternating arm and leg raises  Get on your hands   and knees on a firm surface. If you are on a hard floor, you may want to use padding, such as an exercise mat, to cushion your knees. Line up your arms and legs. Your hands should be directly below your shoulders, and your knees should be directly below your hips. Lift your left leg behind you. At the same time, raise your right arm and straighten it in front of you. Do not lift your leg higher than your hip. Do not lift your arm higher  than your shoulder. Keep your abdominal and back muscles tight. Keep your hips facing the ground. Do not arch your back. Keep your balance carefully, and do not hold your breath. Hold this position for __________ seconds. Slowly return to the starting position. Repeat with your right leg and your left arm. Repeat __________ times. Complete this exercise __________ times a day. Abdominal set with straight leg raise  Lie on your back on a firm bed or the floor. Bend one of your knees and keep your other leg straight. Tense your abdominal muscles and lift your straight leg up, 4-6 inches (10-15 cm) off the ground. Keep your abdominal muscles tight and hold this position for __________ seconds. Do not hold your breath. Do not arch your back. Keep it flat against the ground. Keep your abdominal muscles tense as you slowly lower your leg back to the starting position. Repeat with your other leg. Repeat __________ times. Complete this exercise __________ times a day. Single leg lower with bent knees Lie on your back on a firm bed or the floor. Tense your abdominal muscles and lift your feet off the floor, one foot at a time, so your knees and hips are bent in 90-degree angles (right angles). Your knees should be over your hips and your lower legs should be parallel to the floor. Keeping your abdominal muscles tense and your knee bent, slowly lower one of your legs so your toe touches the ground. Lift your leg back up to return to the starting position. Do not hold your breath. Do not let your back arch. Keep your back flat against the ground. Repeat with your other leg. Repeat __________ times. Complete this exercise __________ times a day. Posture and body mechanics Good posture and healthy body mechanics can help to relieve stress in your body's tissues and joints. Body mechanics refers to the movements and positions of your body while you do your daily activities. Posture is part of body  mechanics. Good posture means: Your spine is in its natural S-curve position (neutral). Your shoulders are pulled back slightly. Your head is not tipped forward (neutral). Follow these guidelines to improve your posture and body mechanics in your everyday activities. Standing  When standing, keep your spine neutral and your feet about hip-width apart. Keep a slight bend in your knees. Your ears, shoulders, and hips should line up. When you do a task in which you stand in one place for a long time, place one foot up on a stable object that is 2-4 inches (5-10 cm) high, such as a footstool. This helps keep your spine neutral. Sitting  When sitting, keep your spine neutral and keep your feet flat on the floor. Use a footrest, if necessary, and keep your thighs parallel to the floor. Avoid rounding your shoulders, and avoid tilting your head forward. When working at a desk or a computer, keep your desk at a height where your hands are slightly lower than your elbows. Slide your   chair under your desk so you are close enough to maintain good posture. When working at a computer, place your monitor at a height where you are looking straight ahead and you do not have to tilt your head forward or downward to look at the screen. Resting When lying down and resting, avoid positions that are most painful for you. If you have pain with activities such as sitting, bending, stooping, or squatting, lie in a position in which your body does not bend very much. For example, avoid curling up on your side with your arms and knees near your chest (fetal position). If you have pain with activities such as standing for a long time or reaching with your arms, lie with your spine in a neutral position and bend your knees slightly. Try the following positions: Lying on your side with a pillow between your knees. Lying on your back with a pillow under your knees. Lifting  When lifting objects, keep your feet at least  shoulder-width apart and tighten your abdominal muscles. Bend your knees and hips and keep your spine neutral. It is important to lift using the strength of your legs, not your back. Do not lock your knees straight out. Always ask for help to lift heavy or awkward objects. This information is not intended to replace advice given to you by your health care provider. Make sure you discuss any questions you have with your health care provider. Document Revised: 03/01/2021 Document Reviewed: 03/01/2021 Elsevier Patient Education  2024 [employer]  

## 2023-06-06 LAB — IRON,TIBC AND FERRITIN PANEL
%SAT: 21 % (calc) (ref 16–45)
Ferritin: 63 ng/mL (ref 16–288)
Iron: 72 ug/dL (ref 45–160)
TIBC: 345 mcg/dL (calc) (ref 250–450)

## 2023-06-06 NOTE — Progress Notes (Signed)
[facility] radiology  @ [phone]  had to LVM to call back

## 2023-06-09 ENCOUNTER — Encounter: Payer: Self-pay | Admitting: Family

## 2023-06-12 ENCOUNTER — Ambulatory Visit
Admission: RE | Admit: 2023-06-12 | Discharge: 2023-06-12 | Disposition: A | Discharge status: Home / Self Care | Payer: PPO | Source: Ambulatory Visit | Attending: Obstetrics & Gynecology | Admitting: Obstetrics & Gynecology

## 2023-06-12 DIAGNOSIS — N83201 Unspecified ovarian cyst, right side: Secondary | ICD-10-CM

## 2023-06-12 DIAGNOSIS — N83291 Other ovarian cyst, right side: Secondary | ICD-10-CM | POA: Diagnosis not present

## 2023-06-12 MED ORDER — GADOPICLENOL 0.5 MMOL/ML IV SOLN
7.0000 mL | Freq: Once | INTRAVENOUS | Status: AC | PRN
  Administered 2023-06-12: 7 mL via INTRAVENOUS

## 2023-06-13 ENCOUNTER — Other Ambulatory Visit: Payer: Self-pay | Admitting: Family

## 2023-06-13 DIAGNOSIS — M545 Low back pain, unspecified: Secondary | ICD-10-CM

## 2023-06-13 MED ORDER — CYCLOBENZAPRINE HCL 5 MG PO TABS
5.0000 mg | ORAL_TABLET | Freq: Every evening | ORAL | 1 refills | Status: AC | PRN
Start: 2023-06-13 — End: ?

## 2023-06-19 ENCOUNTER — Telehealth: Payer: Self-pay | Admitting: *Deleted

## 2023-06-19 ENCOUNTER — Other Ambulatory Visit: Payer: PPO

## 2023-06-19 MED ORDER — MISOPROSTOL 200 MCG PO TABS: 400.0000 ug | ORAL_TABLET | Freq: Once | ORAL | 0 refills | Status: DC

## 2023-06-19 NOTE — Telephone Encounter (Signed)
Pt informed of MRI results, and recommendations for EMB. Will also send in cytotec to take prior to EMB

## 2023-06-19 NOTE — Telephone Encounter (Signed)
-----   Message from Tereso Newcomer, MD sent at 06/19/2023  8:19 AM EDT ----- Benign findings on MRI for adnexal cyst, will follow up as recommended. Already scheduled for endometrial biopsy on 07/04/23. Please call to inform patient of results and recommendations.

## 2023-06-22 ENCOUNTER — Ambulatory Visit
Admission: RE | Admit: 2023-06-22 | Discharge: 2023-06-22 | Disposition: A | Discharge status: Home / Self Care | Payer: PPO | Source: Ambulatory Visit | Attending: Oncology | Admitting: Oncology

## 2023-06-22 DIAGNOSIS — D0512 Intraductal carcinoma in situ of left breast: Secondary | ICD-10-CM | POA: Diagnosis not present

## 2023-06-22 DIAGNOSIS — R92333 Mammographic heterogeneous density, bilateral breasts: Secondary | ICD-10-CM | POA: Diagnosis not present

## 2023-06-22 DIAGNOSIS — Z08 Encounter for follow-up examination after completed treatment for malignant neoplasm: Secondary | ICD-10-CM | POA: Diagnosis not present

## 2023-06-22 DIAGNOSIS — Z86000 Personal history of in-situ neoplasm of breast: Secondary | ICD-10-CM | POA: Diagnosis not present

## 2023-06-26 ENCOUNTER — Telehealth: Payer: Self-pay | Admitting: Family

## 2023-06-26 NOTE — Telephone Encounter (Signed)
-----   Message from Antonieta Iba, MD sent at 06/11/2023 10:53 AM EDT ----- On review of the images, there is minimal aortic atherosclerosis I do not feel strongly about a statin given such minimal buildup I do not think a repeat calcium score is going to show dramatic difference Overall looks pretty good Thx TGollan  ----- Message ----- From: Allegra Grana, FNP Sent: 06/05/2023   1:32 PM EDT To: Antonieta Iba, MD  Dr Mariah Milling,   I wanted your thoughts in regards to thoracic atherosclerosis seen on recent CT a/p.  Ms Medlar, also wanted your thoughts.   Patient had CT calcium score with you in 2019 which was excellent, score of 0.  Cholesterol is well controlled as well. LDL 87. However atherosclerotic risk, 15%.   No particularly strong significant family history ASCVD.  Her father did have a stroke in his 67's when he was off blood pressure medication.    My question to you is whether or not a statin would be wise. Or if monitoring makes more sense.    Would reordering the CT calcium score be reasonable at some point?  Claris Che

## 2023-06-26 NOTE — Telephone Encounter (Signed)
Call patient Please let her know that her from cardiology,  Dr. Mariah Milling in regards to aortic atherosclerosis.  He did not feel strongly about a statin given such minimal buildup.  He also do not think a repeat calcium score would show a dramatic difference.  Overall he felt it looked pretty good.   Please let patient know that we will continue to monitor cholesterol annually

## 2023-06-28 NOTE — Telephone Encounter (Signed)
Spoke to pt and went over notes below pt verbalized understanding

## 2023-06-30 ENCOUNTER — Ambulatory Visit: Payer: PPO | Admitting: Oncology

## 2023-07-04 ENCOUNTER — Other Ambulatory Visit (HOSPITAL_COMMUNITY)
Admission: RE | Admit: 2023-07-04 | Discharge: 2023-07-04 | Disposition: A | Discharge status: Home / Self Care | Payer: PPO | Source: Ambulatory Visit | Attending: Obstetrics & Gynecology | Admitting: Obstetrics & Gynecology

## 2023-07-04 ENCOUNTER — Encounter: Payer: Self-pay | Admitting: Obstetrics & Gynecology

## 2023-07-04 ENCOUNTER — Ambulatory Visit (INDEPENDENT_AMBULATORY_CARE_PROVIDER_SITE_OTHER): Payer: PPO | Admitting: Obstetrics & Gynecology

## 2023-07-04 VITALS — BP 151/78 | HR 65 | Wt 143.0 lb

## 2023-07-04 DIAGNOSIS — R9389 Abnormal findings on diagnostic imaging of other specified body structures: Secondary | ICD-10-CM | POA: Insufficient documentation

## 2023-07-04 DIAGNOSIS — N83201 Unspecified ovarian cyst, right side: Secondary | ICD-10-CM

## 2023-07-04 DIAGNOSIS — N858 Other specified noninflammatory disorders of uterus: Secondary | ICD-10-CM | POA: Diagnosis not present

## 2023-07-04 NOTE — Progress Notes (Signed)
GYNECOLOGY OFFICE VISIT NOTE  History:   Shaunya Hassler is a 72 y.o. G0P0000 here today for evaluation of incidentally found thickened endometrial stripe and complex right ovarian cyst.  History of Tamoxifen therapy in the past, has not taken in years.  She denies any abnormal vaginal discharge, bleeding, pelvic pain or other concerns.    Past Medical History:  Diagnosis Date   Actinic keratosis    Arthritis    Basal cell carcinoma 05/26/2016   R chest parasternal   Basal cell carcinoma 01/21/2015   L nose supratip    Breast cancer (HCC)    Left DCIS   Cancer (HCC)    Complication of anesthesia    Dysrhythmia    PSVT on metoprolol   Family history of breast cancer    Gastric reflux 90's   GERD (gastroesophageal reflux disease)    Headache    H/O MIGRAINES   Hiatal hernia 2003   History of Coronary CT for Calcium Scoring    a. 11/2018 CT Cor Ca2+ = Zero.   Hypertension    Mitral regurgitation    a. 03/2017 Echo: EF 55-60%, no nrwma, Mod MR; b. 03/2018 Echo: EF 60-65%, no rwma. Gr2 DD. Mild MV prolapse of ant leaflet w/ mild to mod mR. Nl RV fxn. Mild to mod TR.   Personal history of radiation therapy 2019   LEFT lumpectomy   PONV (postoperative nausea and vomiting)    N/V   PSVT (paroxysmal supraventricular tachycardia)    a. 03/2017 Event Monitor: Avg HR 66, 10 episodes of SVT up to 42.8 secs, fastest 148 bpm. 8 beats WCT (179 bpm) - VT vs SVT w/ aberrancy. Rare PACs (2.6% total beats).   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   R prox lat bicep    Past Surgical History:  Procedure Laterality Date   AUGMENTATION MAMMAPLASTY Bilateral 01/17/2020   BREAST BIOPSY Left 06/13/2018   Affirm Bx- coil clip   atypical sclerosing and DCIS   BREAST ENHANCEMENT SURGERY Bilateral 01/17/2020   Procedure: MAMMOPLASTY AUGMENTATION WITH PROSTHESIS (BREAST);  Surgeon: Allena Napoleon, MD;  Location: [facility];  Service: Plastics;  Laterality: Bilateral;  2.5 hours  for total case   BREAST EXCISIONAL BIOPSY Right 2000   benign   BREAST LUMPECTOMY WITH NEEDLE LOCALIZATION Left 06/26/2018   Procedure: BREAST LUMPECTOMY WITH NEEDLE LOCALIZATION;  Surgeon: Leafy Ro, MD;  Location: [facility];  Service: General;  Laterality: Left;   CHOLECYSTECTOMY     OVARIAN CYST SURGERY     SCAR REVISION Left 01/17/2020   Procedure: POSSIBLE SCAR REVISION TO LEFT BREAST;  Surgeon: Allena Napoleon, MD;  Location: [facility];  Service: Plastics;  Laterality: Left;   SKIN FULL THICKNESS GRAFT  01/17/2020   Procedure: SKIN GRAFT FULL THICKNESS;  Surgeon: Allena Napoleon, MD;  Location: [facility];  Service: Plastics;;   TONSILLECTOMY     age 43   UPPER GI ENDOSCOPY  2003    The following portions of the patient's history were reviewed and updated as appropriate: allergies, current medications, past family history, past medical history, past social history, past surgical history and problem list.   Review of Systems:  Pertinent items noted in HPI and remainder of comprehensive ROS otherwise negative.  Physical Exam:  BP (!) 151/78   Pulse 65   Wt 143 lb (64.9 kg)   BMI 21.74 kg/m  CONSTITUTIONAL: Well-developed, well-nourished female in no acute distress.  HEENT:  Normocephalic, atraumatic. External right and left ear normal. No scleral icterus.  NECK: Normal range of motion, supple, no masses noted on observation SKIN: No rash noted. Not diaphoretic. No erythema. No pallor. MUSCULOSKELETAL: Normal range of motion. No edema noted. NEUROLOGIC: Alert and oriented to person, place, and time. Normal muscle tone coordination. No cranial nerve deficit noted. PSYCHIATRIC: Normal mood and affect. Normal behavior. Normal judgment and thought content. CARDIOVASCULAR: Normal heart rate noted RESPIRATORY: Effort and breath sounds normal, no problems with respiration noted ABDOMEN: No masses noted. No other overt distention noted.   PELVIC:  Mild erythema noted diffusely on external labia extending to perianal region with some loss of vulvar architecture noted diffusely (patient has known lichen sclerosus et atrophicus).  Scant clear discharge seen.  Atrophic vaginal canal and cervix seen.     ENDOMETRIAL BIOPSY     The indications for endometrial biopsy were reviewed.   Risks of the biopsy including cramping, bleeding, infection, uterine perforation, inadequate specimen and need for additional procedures  were discussed. The patient states she understands and agrees to undergo procedure today. Consent was signed. Time out was performed.  Chaperone was present during entire procedure. During the pelvic exam, the cervix was prepped with Betadine. A paracervical block using 1 ml of 1% Lidocaine was administered.  A single-toothed tenaculum was placed on the anterior lip of the cervix to stabilize it. Dilators were used to widen the cervical canal. The 3 mm pipelle was introduced into the endometrial cavity without difficulty to a depth of 6.5 cm, and a scant amount of tissue was obtained after 4 passes and sent to pathology. The endometrium seemed atrophic on palpation with the pipelle. The instruments were removed from the patient's vagina. Minimal bleeding from the cervix was noted. The patient tolerated the procedure well. Routine post-procedure instructions were given to the patient.     Labs and Imaging MM DIAG BREAST W/IMPLANT TOMO BILATERAL  Result Date: 06/22/2023 CLINICAL DATA:  LEFT lumpectomy 2019 for DCIS. Recently ceased tamoxifen use for endometrial biopsy. EXAM: DIGITAL DIAGNOSTIC BILATERAL MAMMOGRAM WITH IMPLANTS, TOMOSYNTHESIS AND CAD TECHNIQUE: Bilateral digital diagnostic mammography and breast tomosynthesis was performed. Standard and/or implant displaced views were performed. The images were evaluated with computer-aided detection. COMPARISON:  Previous exam(s). ACR Breast Density Category c: The breasts are heterogeneously  dense, which may obscure small masses. FINDINGS: There is density and architectural distortion within the LEFT breast, consistent with postsurgical changes. These are stable in comparison to prior. No suspicious mass, distortion, or microcalcifications are identified to suggest presence of malignancy. The patient has retropectoral implants. IMPRESSION: No mammographic evidence of malignancy bilaterally. RECOMMENDATION: Per protocol, as the patient is now 2 or more years status post lumpectomy, she may return to annual screening mammography in 1 year. However, given the history of breast cancer, the patient remains eligible for annual diagnostic mammography if preferred. (Code:[id number]) I have discussed the findings and recommendations with the patient. If applicable, a reminder letter will be sent to the patient regarding the next appointment. BI-RADS CATEGORY  2: Benign. Electronically Signed   By: Meda Klinefelter M.D.   On: 06/22/2023 10:56   MR PELVIS W WO CONTRAST  Result Date: 06/17/2023 CLINICAL DATA:  Adnexal mass. EXAM: MRI PELVIS WITHOUT AND WITH CONTRAST TECHNIQUE: Multiplanar multisequence MR imaging of the pelvis was performed both before and after administration of intravenous contrast. CONTRAST:  Unavailable COMPARISON:  CT scan 05/29/2023.  Ultrasound exam 05/31/2023 FINDINGS: Exam is markedly motion degraded. Urinary  Tract: Bladder is distended. No evidence for urethral diverticulum. Bowel:  Unremarkable visualized pelvic bowel loops. Vascular/Lymphatic: No pathologically enlarged lymph nodes. No significant vascular abnormality seen. Reproductive: Uterus measures 3.6 x 7.7 x 4.6 cm. As noted on recent ultrasound, endometrial thickness is increased at 14 mm. Focal 13 mm heterogeneous lesion identified in the right fundal endometrium (see coronal 16/7). Right ovary measures 2.7 x 2.2 x 3.7 cm. As noted on recent ultrasound, there is a 2.2 x 2.0 x 3.3 cm septated cystic lesion. Septal elements  are thin and smooth. There is no mural nodularity or papillary projection. Left ovary measures approximately 2.9 x 2.0 x 1.8 cm with a 1.3 cm simple follicle or cystic structure. Other:  Trace free fluid evident in the right cul-de-sac. Musculoskeletal: No focal suspicious marrow enhancement within the visualized bony anatomy. IMPRESSION: 1. Markedly motion degraded study. 2. Endometrial stripe measuring up to 14 mm. 13 mm heterogeneous lesion in the right fundal endometrium. Endometrial sampling recommended as neoplasm a distinct concern. 3. 2.2 x 2.0 x 3.3 cm septated cystic lesion in the right ovary. Septal elements are thin and smooth. No mural nodularity or papillary projection. Imaging features are most suggestive of a benign etiology. Follow-up ultrasound in 6-12 weeks recommended. 4. Trace free fluid in the right cul-de-sac. Electronically Signed   By: Kennith Center M.D.   On: 06/17/2023 06:58   DG HIPS BILAT W OR W/O PELVIS 3-4 VIEWS  Result Date: 06/08/2023 CLINICAL DATA:  Left hip pain. EXAM: DG HIP (WITH OR WITHOUT PELVIS) 5 VIEWS BILAT COMPARISON:  Radiograph right hip from 06/24/2019 FINDINGS: No acute fracture or dislocation. No aggressive osseous lesion. Visualized sacral arcuate lines are unremarkable. Unremarkable sacro-iliac joint. Unremarkable symphysis pubis. There are mild degenerative changes of bilateral hip joints without significant joint space narrowing. Osteophytosis of the superior acetabulum. No radiopaque foreign bodies. IMPRESSION: No acute osseous abnormalities of bilateral hip joints. Electronically Signed   By: Jules Schick M.D.   On: 06/08/2023 14:19   DG Lumbar Spine Complete  Result Date: 06/08/2023 CLINICAL DATA:  Low back pain.  History of breast cancer EXAM: LUMBAR SPINE - COMPLETE 5 VIEW COMPARISON:  Radiograph from 01/17/2023. FINDINGS: There are 5 nonrib-bearing lumbar vertebrae. There is loss of lumbar lordosis, which may be on the basis of positioning or due to  muscle spasm. There is mild dextroscoliosis of the lumbar spine. No spondylolysis.  There is grade 1 anterolisthesis of L3 over L4. Vertebral body heights are maintained. No aggressive osseous lesion. Moderate multilevel degenerative changes in the form of reduced intervertebral disc height, endplate sclerosis/irregularity, facet arthropathy and marginal osteophyte formation. Sacroiliac joints are symmetric. Visualized soft tissues are within normal limits. There are surgical clips in the upper abdomen, typical of a previous cholecystectomy. IMPRESSION: Moderate multilevel degenerative changes, as described above. Electronically Signed   By: Jules Schick M.D.   On: 06/08/2023 13:58   US PELVIS TRANSVAGINAL NON-OB (TV ONLY)  Result Date: 05/31/2023 CLINICAL DATA:  Adnexal cysts EXAM: TRANSABDOMINAL AND TRANSVAGINAL ULTRASOUND OF PELVIS TECHNIQUE: Both transabdominal and transvaginal ultrasound examinations of the pelvis were performed. Transabdominal technique was performed for global imaging of the pelvis including uterus, ovaries, adnexal regions, and pelvic cul-de-sac. It was necessary to proceed with endovaginal exam following the transabdominal exam to visualize the uterus and ovaries. COMPARISON:  None Available. FINDINGS: Uterus Measurements: 6.3 x 3.6 x 4.4 cm = volume: 52.8 mL. Anteverted. Small intramural fibroid measuring 1.1 x 1.0 x 0.9 cm of the  anterior right body of the uterus uterus. Endometrium Thickness: 15 mm. Heterogeneous with areas of flow seen on color Doppler imaging. Right ovary Measurements: 3.1 x 2.2 x 3.2 cm = volume: 11.4 mL. Large multi septated cyst measuring 2.6 x 2.4 x 2.2 cm, some of the septations demonstrate vascularity on color Doppler imaging. Left ovary Measurements: 2.3 x 1.9 x 1.7 cm = volume: 4.0 mL. Small simple appearing cyst measuring 1.5 x 1.2 x 1.2 cm. Other findings No abnormal free fluid. IMPRESSION: 1. Heterogeneous endometrium measuring up to 15 mm thickness  with areas of vascularity, concerning for endometrial malignancy. Recommend endometrial sampling. 2. Large multi septated right ovarian cyst, some of the septations demonstrate areas of vascularity. Recommend contrast-enhanced pelvic MRI for further evaluation. Electronically Signed   By: Allegra Lai M.D.   On: 05/31/2023 14:30   CT ABDOMEN PELVIS W WO CONTRAST  Result Date: 05/29/2023 CLINICAL DATA:  Worsening acute on chronic pelvic and low back pain radiating to the abdomen. History of breast cancer. * Tracking Code: BO * EXAM: CT ABDOMEN AND PELVIS WITHOUT AND WITH CONTRAST TECHNIQUE: Multidetector CT imaging of the abdomen and pelvis was performed following the standard protocol before and following the bolus administration of intravenous contrast. RADIATION DOSE REDUCTION: This exam was performed according to the departmental dose-optimization program which includes automated exposure control, adjustment of the mA and/or kV according to patient size and/or use of iterative reconstruction technique. CONTRAST:  80mL OMNIPAQUE IOHEXOL 300 MG/ML  SOLN COMPARISON:  None Available. FINDINGS: Lower chest: No significant pulmonary nodules or acute consolidative airspace disease. Hepatobiliary: Normal liver size. No liver mass. Cholecystectomy. No biliary ductal dilatation. Pancreas: Normal, with no mass or duct dilation. Spleen: Normal size. No mass. Adrenals/Urinary Tract: Normal adrenals. No renal stones. No hydronephrosis. No renal masses. Symmetric normal contrast nephrograms. Normal caliber ureters. No ureteral stones. Normal bladder. Stomach/Bowel: Normal non-distended stomach. Normal caliber small bowel with no small bowel wall thickening. Normal appendix. Normal large bowel with no diverticulosis, large bowel wall thickening or pericolonic fat stranding. Vascular/Lymphatic: Atherosclerotic nonaneurysmal abdominal aorta. Patent portal, splenic, hepatic and renal veins. No pathologically enlarged lymph  nodes in the abdomen or pelvis. Reproductive: Abnormally thickened (11 mm) heterogeneous endometrium. Simple 2.6 cm right adnexal cyst and simple 1.5 cm left adnexal cyst (series 7/image 64). Other: No pneumoperitoneum, ascites or focal fluid collection. Partially visualized bilateral breast prostheses. Musculoskeletal: No aggressive appearing focal osseous lesions. Moderate multilevel lumbar degenerative disc disease. IMPRESSION: 1. Abnormally thickened (11 mm) heterogeneous endometrium, nonspecific. Pelvic ultrasound correlation advised. 2. No lymphadenopathy or other findings of metastatic disease in the abdomen or pelvis. 3. No acute abnormality. No evidence of bowel obstruction or acute bowel inflammation. Normal appendix. 4. Simple 2.6 cm right adnexal cyst and simple 1.5 cm left adnexal cyst. No follow-up imaging recommended. Note: This recommendation does not apply to premenarchal patients and to those with increased risk (genetic, family history, elevated tumor markers or other high-risk factors) of ovarian cancer. Reference: JACR 2020 Feb; 17(2):248-254 5.  Aortic Atherosclerosis (ICD10-I70.0). Electronically Signed   By: Delbert Phenix M.D.   On: 05/29/2023 12:58       Assessment and Plan:     1. Cyst of right ovary Likely benign. ROMA profile checked, will follow up results and manage accordingly. Follow up ultrasound ordered as recommended, will follow up results and manage accordingly. - Ovarian Malignancy Risk-ROMA - US PELVIC COMPLETE WITH TRANSVAGINAL; Future  2. Thickened endometrium Endometrial biopsy done, unsure about adequacy of  sample. If inadequate, discussed likely need for Hysteroscopy, Dilation and Curettage.  - Surgical pathology  Please refer to After Visit Summary for other counseling recommendations.   Return for follow up as recommended.    I spent 30 minutes dedicated to the care of this patient including pre-visit review of records, face to face time with the  patient discussing her conditions and treatments and post visit orders.    Jaynie Collins, MD, FACOG Obstetrician & Gynecologist, [facility] for [employer], [facility]

## 2023-07-04 NOTE — Patient Instructions (Signed)

## 2023-07-05 ENCOUNTER — Ambulatory Visit: Payer: PPO

## 2023-07-05 LAB — OVARIAN MALIGNANCY RISK-ROMA
Cancer Antigen (CA) 125: 14.3 U/mL (ref 0.0–38.1)
HE4: 99 pmol/L — ABNORMAL HIGH (ref 0.0–96.9)
Postmenopausal ROMA: 2.04
Premenopausal ROMA: 2.9 — ABNORMAL HIGH

## 2023-07-05 LAB — PREMENOPAUSAL INTERP: HIGH

## 2023-07-05 LAB — POSTMENOPAUSAL INTERP: LOW

## 2023-07-05 LAB — SURGICAL PATHOLOGY

## 2023-07-05 NOTE — Progress Notes (Signed)
Overall, reassuring results with negative postmenopausal ROMA and CA-125. Benign endometrial biopsy. Patient was called and results were discussed with her over the phone. Results were also released to MyChart after the phone call.   Jaynie Collins, MD

## 2023-07-06 ENCOUNTER — Inpatient Hospital Stay: Payer: PPO | Attending: Oncology | Admitting: Oncology

## 2023-07-06 ENCOUNTER — Encounter: Payer: Self-pay | Admitting: Oncology

## 2023-07-06 VITALS — BP 132/76 | HR 67 | Temp 96.9°F | Resp 19 | Wt 141.9 lb

## 2023-07-06 DIAGNOSIS — D0512 Intraductal carcinoma in situ of left breast: Secondary | ICD-10-CM

## 2023-07-06 DIAGNOSIS — M8589 Other specified disorders of bone density and structure, multiple sites: Secondary | ICD-10-CM | POA: Diagnosis not present

## 2023-07-06 DIAGNOSIS — Z923 Personal history of irradiation: Secondary | ICD-10-CM | POA: Insufficient documentation

## 2023-07-06 DIAGNOSIS — M85852 Other specified disorders of bone density and structure, left thigh: Secondary | ICD-10-CM

## 2023-07-06 DIAGNOSIS — Z79899 Other long term (current) drug therapy: Secondary | ICD-10-CM | POA: Diagnosis not present

## 2023-07-06 NOTE — Progress Notes (Signed)
Hematology/Oncology Consult note [facility]  [phone] [phone]  Patient Care Team: Mort Sawyers, FNP as PCP - General (Family Medicine) Antonieta Iba, MD as PCP - Cardiology (Cardiology) Madlyn Frankel, MD as Referring Physician (Dermatology) Creig Hines, MD as Consulting Physician (Oncology)   Name of the patient: Susan [name]  [id number]  [birth year]   Date of visit: 07/06/23  Diagnosis-  left breast DCIS ER positive  Chief complaint/ Reason for visit-routine follow-up of DCIS  Heme/Onc history: Patient is a 72 year old female diagnosed with 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  2 adjacent groups of heterogeneous calcifications spanning 1.1 x 2 x 2.4 cm.  She underwent lumpectomy in July 2019.  Pathology showed 2.8 cm grade 2-3 DCIS with sclerosing adenosis.  ER greater than 90% positive and PR negative.  She then underwent adjuvant radiation treatment and began tamoxifen in September 2019.  Patient has baseline osteopenia with a T score of -1.8 in the left femoral neck.  Tamoxifen was stopped sometime in June 2024 due to concerns of endometrial hyperplasia and ovarian cyst.  Biopsies negative for malignancy  Interval history-patient is doing well off tamoxifen.  She stopped taking it in June 2024 after she had ultrasound which showed endometrial proliferation requiring biopsy.  Biopsies were negative for malignancy.  ECOG PS- 1 Pain scale- 0   Review of systems- Review of Systems  Constitutional:  Negative for chills, fever, malaise/fatigue and weight loss.  HENT:  Negative for congestion, ear discharge and nosebleeds.   Eyes:  Negative for blurred vision.  Respiratory:  Negative for cough, hemoptysis, sputum production, shortness of breath and wheezing.   Cardiovascular:  Negative for chest pain, palpitations, orthopnea and claudication.  Gastrointestinal:  Negative for abdominal pain, blood in stool,  constipation, diarrhea, heartburn, melena, nausea and vomiting.  Genitourinary:  Negative for dysuria, flank pain, frequency, hematuria and urgency.  Musculoskeletal:  Negative for back pain, joint pain and myalgias.  Skin:  Negative for rash.  Neurological:  Negative for dizziness, tingling, focal weakness, seizures, weakness and headaches.  Endo/Heme/Allergies:  Does not bruise/bleed easily.  Psychiatric/Behavioral:  Negative for depression and suicidal ideas. The patient does not have insomnia.       No Known Allergies   Past Medical History:  Diagnosis Date   Actinic keratosis    Arthritis    Basal cell carcinoma 05/26/2016   R chest parasternal   Basal cell carcinoma 01/21/2015   L nose supratip    Breast cancer (HCC)    Left DCIS   Cancer (HCC)    Complication of anesthesia    Dysrhythmia    PSVT on metoprolol   Family history of breast cancer    Gastric reflux 90's   GERD (gastroesophageal reflux disease)    Headache    H/O MIGRAINES   Hiatal hernia 2003   History of Coronary CT for Calcium Scoring    a. 11/2018 CT Cor Ca2+ = Zero.   Hypertension    Mitral regurgitation    a. 03/2017 Echo: EF 55-60%, no nrwma, Mod MR; b. 03/2018 Echo: EF 60-65%, no rwma. Gr2 DD. Mild MV prolapse of ant leaflet w/ mild to mod mR. Nl RV fxn. Mild to mod TR.   Personal history of radiation therapy 2019   LEFT lumpectomy   PONV (postoperative nausea and vomiting)    N/V   PSVT (paroxysmal supraventricular tachycardia)    a. 03/2017 Event Monitor: Avg HR 66, 10  episodes of SVT up to 42.8 secs, fastest 148 bpm. 8 beats WCT (179 bpm) - VT vs SVT w/ aberrancy. Rare PACs (2.6% total beats).   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   R prox lat bicep     Past Surgical History:  Procedure Laterality Date   AUGMENTATION MAMMAPLASTY Bilateral 01/17/2020   BREAST BIOPSY Left 06/13/2018   Affirm Bx- coil clip   atypical sclerosing and DCIS   BREAST ENHANCEMENT SURGERY Bilateral  01/17/2020   Procedure: MAMMOPLASTY AUGMENTATION WITH PROSTHESIS (BREAST);  Surgeon: Allena Napoleon, MD;  Location: [facility];  Service: Plastics;  Laterality: Bilateral;  2.5 hours for total case   BREAST EXCISIONAL BIOPSY Right 2000   benign   BREAST LUMPECTOMY WITH NEEDLE LOCALIZATION Left 06/26/2018   Procedure: BREAST LUMPECTOMY WITH NEEDLE LOCALIZATION;  Surgeon: Leafy Ro, MD;  Location: [facility];  Service: General;  Laterality: Left;   CHOLECYSTECTOMY     OVARIAN CYST SURGERY     SCAR REVISION Left 01/17/2020   Procedure: POSSIBLE SCAR REVISION TO LEFT BREAST;  Surgeon: Allena Napoleon, MD;  Location: [facility];  Service: Plastics;  Laterality: Left;   SKIN FULL THICKNESS GRAFT  01/17/2020   Procedure: SKIN GRAFT FULL THICKNESS;  Surgeon: Allena Napoleon, MD;  Location: [facility];  Service: Plastics;;   TONSILLECTOMY     age 72   UPPER GI ENDOSCOPY  2003    Social History   Socioeconomic History   Marital status: Married    Spouse name: Richard   Number of children: 2   Years of education: [employer] education level: Bachelor's degree (e.g., BA, AB, BS)  Occupational History   Occupation: retired  Tobacco Use   Smoking status: Never   Smokeless tobacco: Never  Vaping Use   Vaping status: Never Used  Substance and Sexual Activity   Alcohol use: Yes    Alcohol/week: 2.0 standard drinks of alcohol    Types: 2 Glasses of wine per week    Comment: daily WINE   Drug use: No   Sexual activity: Not Currently    Birth control/protection: Post-menopausal  Other Topics Concern   Not on file  Social History Narrative   03/30/20   From: the area, [facility]   Living: with husband Gerlene Burdock [phone])   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      Family: 2 step children - Probation officer and [place] - 3 grandchildren, good relationships      Enjoys: garden, flowers      Exercise: walking, walking the dog IT sales professional doodle) -  daily, average 6 mile/day   Diet: fairly good       Safety   Seat belts: Yes    Guns: Yes  and secure   Safe in relationships: Yes    Social Determinants of Health   Financial Resource Strain: Low Risk  (05/29/2023)   Overall Financial Resource Strain (CARDIA)    Difficulty of Paying Living Expenses: Not hard at all  Food Insecurity: No Food Insecurity (05/29/2023)   Hunger Vital Sign    Worried About Running Out of Food in the Last Year: Never true    Ran Out of Food in the Last Year: Never true  Transportation Needs: No Transportation Needs (05/29/2023)   PRAPARE - Administrator, Civil Service (Medical): No    Lack of Transportation (Non-Medical): No  Physical Activity: Sufficiently Active (05/29/2023)   Exercise Vital Sign  Days of Exercise per Week: 7 days    Minutes of Exercise per Session: 40 min  Stress: No Stress Concern Present (05/29/2023)   [employer] of Occupational Health - Occupational Stress Questionnaire    Feeling of Stress : Only a little  Social Connections: Moderately Isolated (05/29/2023)   Social Connection and Isolation Panel [NHANES]    Frequency of Communication with Friends and Family: More than three times a week    Frequency of Social Gatherings with Friends and Family: More than three times a week    Attends Religious Services: Never    Database administrator or Organizations: No    Attends Engineer, structural: Not on file    Marital Status: Married  Catering manager Violence: Not At Risk (01/27/2022)   Humiliation, Afraid, Rape, and Kick questionnaire    Fear of Current or Ex-Partner: No    Emotionally Abused: No    Physically Abused: No    Sexually Abused: No    Family History  Problem Relation Age of Onset   Hyperlipidemia Mother    Hypertension Mother    COPD Mother    Heart failure Mother        deceased 73   Stroke Father 31   Hypertension Father    Hypertension Sister    Hypertension Brother    Healthy Brother     Melanoma Paternal Uncle    Breast cancer Cousin 65       daughter of paternal aunt; currently 3   Breast cancer Cousin 49       daughter of paternal uncle; currently 26   Breast cancer Cousin 108       daughter of maternal uncle; deceased 73   Breast cancer Cousin 36       daughter of same maternal uncle; currently 9   Testicular cancer Other 6       sister's son; currently 58   Coronary artery disease Neg Hx      Current Outpatient Medications:    ALPRAZolam (XANAX) 0.5 MG tablet, Take 1 tablet (0.5 mg total) by mouth at bedtime as needed. for sleep, Disp: 30 tablet, Rfl: 0   calcium citrate-vitamin D 500-500 MG-UNIT chewable tablet, Chew 1 tablet by mouth 2 (two) times daily., Disp: , Rfl:    cholecalciferol (VITAMIN D) 1000 units tablet, Take 1,000 Units by mouth daily., Disp: , Rfl:    cyclobenzaprine (FLEXERIL) 5 MG tablet, Take 1-2 tablets (5-10 mg total) by mouth at bedtime as needed for muscle spasms., Disp: 30 tablet, Rfl: 1   famotidine (PEPCID) 20 MG tablet, Take 20 mg by mouth as needed for heartburn or indigestion., Disp: , Rfl:    fluticasone (FLONASE) 50 MCG/ACT nasal spray, Place 2 sprays into both nostrils as needed. , Disp: , Rfl:    meloxicam (MOBIC) 15 MG tablet, Take 1 tablet (15 mg total) by mouth daily., Disp: 90 tablet, Rfl: 1   metoprolol succinate (TOPROL-XL) 25 MG 24 hr tablet, TAKE 1/2 TABLET BY MOUTH EVERY DAY, Disp: 45 tablet, Rfl: 1   misoprostol (CYTOTEC) 200 MCG tablet, Place 2 tablets (400 mcg total) vaginally once for 1 dose. Place the night before procedure or at least 2 hours prior to procedure, Disp: 2 tablet, Rfl: 0   Multiple Vitamin (MULTIVITAMIN) tablet, Take 1 tablet by mouth daily., Disp: , Rfl:    psyllium (REGULOID) 0.52 g capsule, Take 0.52 g by mouth 3 (three) times daily. , Disp: , Rfl:  SIMETHICONE-80 PO, Take 80 mg by mouth as needed (gas). , Disp: , Rfl:    topiramate (TOPAMAX) 25 MG tablet, Take 1 tablet (25 mg total) by mouth 2  (two) times daily., Disp: 180 tablet, Rfl: 1   clobetasol ointment (TEMOVATE) 0.05 %, Apply to affected area every night for 4 weeks, then every other day for 4 weeks and then twice a week for 4 weeks or until resolution., Disp: 30 g, Rfl: 5   Ivermectin 1 % CREA, Apply 1 Application topically at bedtime., Disp: 30 g, Rfl: 11  Physical exam:  Vitals:   07/06/23 0932  BP: 132/76  Pulse: 67  Resp: 19  Temp: (!) 96.9 F (36.1 C)  SpO2: 100%  Weight: 141 lb 14.4 oz (64.4 kg)   Physical Exam Cardiovascular:     Rate and Rhythm: Normal rate and regular rhythm.     Heart sounds: Normal heart sounds.  Pulmonary:     Effort: Pulmonary effort is normal.     Breath sounds: Normal breath sounds.  Skin:    General: Skin is warm and dry.  Neurological:     Mental Status: She is alert and oriented to person, place, and time.         Latest Ref Rng & Units 05/29/2023   12:39 PM  CMP  Creatinine 0.44 - 1.00 mg/dL 1.61       Latest Ref Rng & Units 08/12/2022    9:57 AM  CBC  WBC 4.0 - 10.5 K/uL 5.6   Hemoglobin 12.0 - 15.0 g/dL 09.6   Hematocrit 04.5 - 46.0 % 44.4   Platelets 150.0 - 400.0 K/uL 273.0     No images are attached to the encounter.  MM DIAG BREAST W/IMPLANT TOMO BILATERAL  Result Date: 06/22/2023 CLINICAL DATA:  LEFT lumpectomy 2019 for DCIS. Recently ceased tamoxifen use for endometrial biopsy. EXAM: DIGITAL DIAGNOSTIC BILATERAL MAMMOGRAM WITH IMPLANTS, TOMOSYNTHESIS AND CAD TECHNIQUE: Bilateral digital diagnostic mammography and breast tomosynthesis was performed. Standard and/or implant displaced views were performed. The images were evaluated with computer-aided detection. COMPARISON:  Previous exam(s). ACR Breast Density Category c: The breasts are heterogeneously dense, which may obscure small masses. FINDINGS: There is density and architectural distortion within the LEFT breast, consistent with postsurgical changes. These are stable in comparison to prior. No  suspicious mass, distortion, or microcalcifications are identified to suggest presence of malignancy. The patient has retropectoral implants. IMPRESSION: No mammographic evidence of malignancy bilaterally. RECOMMENDATION: Per protocol, as the patient is now 2 or more years status post lumpectomy, she may return to annual screening mammography in 1 year. However, given the history of breast cancer, the patient remains eligible for annual diagnostic mammography if preferred. (Code:SM-B-01Y) I have discussed the findings and recommendations with the patient. If applicable, a reminder letter will be sent to the patient regarding the next appointment. BI-RADS CATEGORY  2: Benign. Electronically Signed   By: Meda Klinefelter M.D.   On: 06/22/2023 10:56   MR PELVIS W WO CONTRAST  Result Date: 06/17/2023 CLINICAL DATA:  Adnexal mass. EXAM: MRI PELVIS WITHOUT AND WITH CONTRAST TECHNIQUE: Multiplanar multisequence MR imaging of the pelvis was performed both before and after administration of intravenous contrast. CONTRAST:  Unavailable COMPARISON:  CT scan 05/29/2023.  Ultrasound exam 05/31/2023 FINDINGS: Exam is markedly motion degraded. Urinary Tract: Bladder is distended. No evidence for urethral diverticulum. Bowel:  Unremarkable visualized pelvic bowel loops. Vascular/Lymphatic: No pathologically enlarged lymph nodes. No significant vascular abnormality seen. Reproductive: Uterus measures 3.6  x 7.7 x 4.6 cm. As noted on recent ultrasound, endometrial thickness is increased at 14 mm. Focal 13 mm heterogeneous lesion identified in the right fundal endometrium (see coronal 16/7). Right ovary measures 2.7 x 2.2 x 3.7 cm. As noted on recent ultrasound, there is a 2.2 x 2.0 x 3.3 cm septated cystic lesion. Septal elements are thin and smooth. There is no mural nodularity or papillary projection. Left ovary measures approximately 2.9 x 2.0 x 1.8 cm with a 1.3 cm simple follicle or cystic structure. Other:  Trace free  fluid evident in the right cul-de-sac. Musculoskeletal: No focal suspicious marrow enhancement within the visualized bony anatomy. IMPRESSION: 1. Markedly motion degraded study. 2. Endometrial stripe measuring up to 14 mm. 13 mm heterogeneous lesion in the right fundal endometrium. Endometrial sampling recommended as neoplasm a distinct concern. 3. 2.2 x 2.0 x 3.3 cm septated cystic lesion in the right ovary. Septal elements are thin and smooth. No mural nodularity or papillary projection. Imaging features are most suggestive of a benign etiology. Follow-up ultrasound in 6-12 weeks recommended. 4. Trace free fluid in the right cul-de-sac. Electronically Signed   By: Kennith Center M.D.   On: 06/17/2023 06:58     Assessment and plan- Patient is a 72 y.o. female with history of left breast DCIS here for routine follow-up  Patient was supposed to complete 5 years of tamoxifen in September 2024.  However she was found to have endometrial proliferation on ultrasound requiring biopsies which were negative for malignancy and she stopped taking tamoxifen in June 2024 which I think is reasonable.  She has not had to restart it at this time.  She will continue to follow-up with me on a yearly basis for breast exams.  I will plan to see her in January 2025 for a breast exam and following that it would be a yearly visit.  Her recent mammogram from June 2024 was negative for malignancy.  Patient has baseline osteopenia but does not require any adjuvant bisphosphonates.  She will be due for repeat bone density in September 2024 which I will schedule.   Visit Diagnosis 1. Ductal carcinoma in situ of left breast   2. Osteopenia of neck of left femur      Dr. Owens Shark, MD, MPH [facility] [phone] 07/06/2023 12:44 PM

## 2023-07-12 ENCOUNTER — Other Ambulatory Visit: Payer: Self-pay | Admitting: Family

## 2023-07-12 DIAGNOSIS — G8929 Other chronic pain: Secondary | ICD-10-CM

## 2023-08-01 NOTE — Progress Notes (Signed)
Xrays resulted  in chart already

## 2023-08-02 ENCOUNTER — Ambulatory Visit
Admission: RE | Admit: 2023-08-02 | Discharge: 2023-08-02 | Disposition: A | Discharge status: Home / Self Care | Payer: PPO | Source: Ambulatory Visit | Attending: Obstetrics & Gynecology | Admitting: Obstetrics & Gynecology

## 2023-08-02 DIAGNOSIS — N83201 Unspecified ovarian cyst, right side: Secondary | ICD-10-CM | POA: Insufficient documentation

## 2023-08-02 DIAGNOSIS — N858 Other specified noninflammatory disorders of uterus: Secondary | ICD-10-CM | POA: Diagnosis not present

## 2023-08-09 ENCOUNTER — Telehealth: Payer: Self-pay | Admitting: *Deleted

## 2023-08-09 NOTE — Telephone Encounter (Signed)
Called pt to discuss [place] results and Dr A wanting pt to make an appt to discuss further. Appt made for 08/24/23 at 2:30pm

## 2023-08-09 NOTE — Telephone Encounter (Signed)
-----   Message from Frederika Anyanwu sent at 08/08/2023  4:02 PM EDT ----- Decreased size of right adnexal cyst, very likely benign. More concerning findings about the endometrium, had benign endometrial biopsy on 07/04/23 but she may need to undergo Hysteroscopy, Dilation and Curettage in order to visualize the uterine lining and obtain more tissue especially from the mass seen.  Please schedule appointment for her with me to discuss this further.  Please call to inform patient of results and recommendations.

## 2023-08-10 ENCOUNTER — Encounter (INDEPENDENT_AMBULATORY_CARE_PROVIDER_SITE_OTHER): Payer: Self-pay

## 2023-08-14 ENCOUNTER — Ambulatory Visit (INDEPENDENT_AMBULATORY_CARE_PROVIDER_SITE_OTHER): Payer: PPO | Admitting: Family Medicine

## 2023-08-14 ENCOUNTER — Encounter: Payer: Self-pay | Admitting: Family Medicine

## 2023-08-14 VITALS — BP 118/62 | HR 62 | Temp 98.0°F | Resp 16 | Ht 68.0 in | Wt 141.4 lb

## 2023-08-14 DIAGNOSIS — F39 Unspecified mood [affective] disorder: Secondary | ICD-10-CM | POA: Diagnosis not present

## 2023-08-14 MED ORDER — CITALOPRAM HYDROBROMIDE 10 MG PO TABS
10.0000 mg | ORAL_TABLET | Freq: Every day | ORAL | 0 refills | Status: DC
Start: 2023-08-14 — End: 2023-09-07

## 2023-08-14 NOTE — Progress Notes (Signed)
SUBJECTIVE:   Chief Complaint  Patient presents with   Anxiety   HPI Presents to clinic for acute visit  Mood disorder Requesting medication for anxiety. Reports increasing stress at home with elderly husband and cognitive decline.  No formal diagnosis of Dementia/Alzheimer but she reports that his memory is slowly declining, his alcohol intake is increasing.  She reports that this is adding to her stress level.  She would like to start daily medication at this time. Was last prescribed Xanax in 2023. Uses intermittently. Has also tried Zoloft in past.   PERTINENT PMH / PSH: Mood disorder  OBJECTIVE:  BP 118/62   Pulse 62   Temp 98 F (36.7 C)   Resp 16   Ht 5\' 8"  (1.727 m)   Wt 141 lb 6 oz (64.1 kg)   SpO2 98%   BMI 21.50 kg/m    Physical Exam Constitutional:      General: She is not in acute distress.    Appearance: She is normal weight. She is not ill-appearing.  HENT:     Head: Normocephalic.  Eyes:     Conjunctiva/sclera: Conjunctivae normal.  Cardiovascular:     Rate and Rhythm: Normal rate and regular rhythm.     Pulses: Normal pulses.  Pulmonary:     Effort: Pulmonary effort is normal.     Breath sounds: Normal breath sounds.  Abdominal:     General: Bowel sounds are normal.  Neurological:     Mental Status: She is alert. Mental status is at baseline.  Psychiatric:        Mood and Affect: Mood normal.        Behavior: Behavior normal.        Thought Content: Thought content normal.        Judgment: Judgment normal.        08/14/2023    1:07 PM 05/29/2023   10:05 AM 01/30/2023    2:37 PM 01/17/2023    9:40 AM 01/27/2022    2:54 PM  Depression screen PHQ 2/9  Decreased Interest 0 0 0 0 0  Down, Depressed, Hopeless 0 0 0 0 0  PHQ - 2 Score 0 0 0 0 0  Altered sleeping 1 1 0 0   Tired, decreased energy 0 0 0 0   Change in appetite 0 0 0 0   Feeling bad or failure about yourself  0 0 0 0   Trouble concentrating 0 0 0 0   Moving slowly or  fidgety/restless 0 0 0 0   Suicidal thoughts 0 0 0 0   PHQ-9 Score 1 1 0 0   Difficult doing work/chores Somewhat difficult Not difficult at all  Not difficult at all       08/14/2023    1:45 PM 05/29/2023   10:06 AM 01/17/2023    9:40 AM  GAD 7 : Generalized Anxiety Score  Nervous, Anxious, on Edge 2 0 0  Control/stop worrying 2 0 0  Worry too much - different things 1 0 0  Trouble relaxing 1 0 0  Restless 0 0 0  Easily annoyed or irritable 0 0 0  Afraid - awful might happen 0 0 0  Total GAD 7 Score 6 0 0  Anxiety Difficulty Somewhat difficult Not difficult at all Not difficult at all    ASSESSMENT/PLAN:  Mood disorder (HCC) Assessment & Plan: PHQ9/GAD increased.  Denies SI/HI Increasing anxiety precipitated by increasing caregiver responsibilities Start Celexa 10 mg daily Follow up  with PCP in 3 weeks as scheduled Patient to notify MD of any side effects of medication in 1 week.    Orders: -     Citalopram Hydrobromide; Take 1 tablet (10 mg total) by mouth daily.  Dispense: 30 tablet; Refill: 0   PDMP reviewed  Return in about 24 days (around 09/07/2023) for PCP.  Dana Allan, MD

## 2023-08-14 NOTE — Patient Instructions (Addendum)
It was a pleasure meeting you today. Thank you for allowing me to take part in your health care.  Our goals for today as we discussed include:  Start Celexa 10 mg daily Follow up with PCP in 2 weeks  [facility]  [address]  [place] [address] [phone]    [email or web address]  [email or web address]  [email or web address] [address] [place] [phone]   Please send MyChart message in 1 week to notify of any side effects of medication  Follow up with your PCP as scheduled 09/12 for continued management   If you have any questions or concerns, please do not hesitate to call the office at [phone].  I look forward to our next visit and until then take care and stay safe.  Regards,   Dana Allan, MD   [facility]

## 2023-08-14 NOTE — Assessment & Plan Note (Signed)
PHQ9/GAD increased.  Denies SI/HI Increasing anxiety precipitated by increasing caregiver responsibilities Start Celexa 10 mg daily Follow up with PCP in 3 weeks as scheduled Patient to notify MD of any side effects of medication in 1 week.

## 2023-08-17 ENCOUNTER — Other Ambulatory Visit: Payer: Self-pay | Admitting: Cardiovascular Disease

## 2023-08-24 ENCOUNTER — Other Ambulatory Visit: Payer: Self-pay | Admitting: Family

## 2023-08-24 ENCOUNTER — Ambulatory Visit (INDEPENDENT_AMBULATORY_CARE_PROVIDER_SITE_OTHER): Payer: PPO | Admitting: Obstetrics & Gynecology

## 2023-08-24 ENCOUNTER — Encounter: Payer: Self-pay | Admitting: Obstetrics & Gynecology

## 2023-08-24 VITALS — BP 156/76 | HR 61 | Wt 142.2 lb

## 2023-08-24 DIAGNOSIS — N83201 Unspecified ovarian cyst, right side: Secondary | ICD-10-CM | POA: Diagnosis not present

## 2023-08-24 DIAGNOSIS — R9389 Abnormal findings on diagnostic imaging of other specified body structures: Secondary | ICD-10-CM

## 2023-08-24 DIAGNOSIS — G250 Essential tremor: Secondary | ICD-10-CM

## 2023-08-24 NOTE — Progress Notes (Signed)
GYNECOLOGY OFFICE VISIT NOTE  History:   Susan [name] is a 72 y.o. G0P0000 here today for follow up after recent ultrasound results. She denies any abnormal vaginal discharge, bleeding, pelvic pain or other concerns.    Past Medical History:  Diagnosis Date   Actinic keratosis    Arthritis    Basal cell carcinoma 05/26/2016   R chest parasternal   Basal cell carcinoma 01/21/2015   L nose supratip    Breast cancer (HCC)    Left DCIS   Cancer (HCC)    Complication of anesthesia    Dysrhythmia    PSVT on metoprolol   Family history of breast cancer    Gastric reflux 90's   GERD (gastroesophageal reflux disease)    Headache    H/O MIGRAINES   Hiatal hernia 2003   History of Coronary CT for Calcium Scoring    a. 11/2018 CT Cor Ca2+ = Zero.   Hypertension    Mitral regurgitation    a. 03/2017 Echo: EF 55-60%, no nrwma, Mod MR; b. 03/2018 Echo: EF 60-65%, no rwma. Gr2 DD. Mild MV prolapse of ant leaflet w/ mild to mod mR. Nl RV fxn. Mild to mod TR.   Personal history of radiation therapy 2019   LEFT lumpectomy   PONV (postoperative nausea and vomiting)    N/V   PSVT (paroxysmal supraventricular tachycardia)    a. 03/2017 Event Monitor: Avg HR 66, 10 episodes of SVT up to 42.8 secs, fastest 148 bpm. 8 beats WCT (179 bpm) - VT vs SVT w/ aberrancy. Rare PACs (2.6% total beats).   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   R prox lat bicep    Past Surgical History:  Procedure Laterality Date   AUGMENTATION MAMMAPLASTY Bilateral 01/17/2020   BREAST BIOPSY Left 06/13/2018   Affirm Bx- coil clip   atypical sclerosing and DCIS   BREAST ENHANCEMENT SURGERY Bilateral 01/17/2020   Procedure: MAMMOPLASTY AUGMENTATION WITH PROSTHESIS (BREAST);  Surgeon: Allena Napoleon, MD;  Location: [facility];  Service: Plastics;  Laterality: Bilateral;  2.5 hours for total case   BREAST EXCISIONAL BIOPSY Right 2000   benign   BREAST LUMPECTOMY WITH NEEDLE LOCALIZATION Left  06/26/2018   Procedure: BREAST LUMPECTOMY WITH NEEDLE LOCALIZATION;  Surgeon: Leafy Ro, MD;  Location: [facility];  Service: General;  Laterality: Left;   CHOLECYSTECTOMY     OVARIAN CYST SURGERY     SCAR REVISION Left 01/17/2020   Procedure: POSSIBLE SCAR REVISION TO LEFT BREAST;  Surgeon: Allena Napoleon, MD;  Location: [facility];  Service: Plastics;  Laterality: Left;   SKIN FULL THICKNESS GRAFT  01/17/2020   Procedure: SKIN GRAFT FULL THICKNESS;  Surgeon: Allena Napoleon, MD;  Location: [facility];  Service: Plastics;;   TONSILLECTOMY     age 56   UPPER GI ENDOSCOPY  2003    The following portions of the patient's history were reviewed and updated as appropriate: allergies, current medications, past family history, past medical history, past social history, past surgical history and problem list.   Review of Systems:  Pertinent items noted in HPI and remainder of comprehensive ROS otherwise negative.  Physical Exam:  BP (!) 156/76   Pulse 61   Wt 142 lb 3.2 oz (64.5 kg)   BMI 21.62 kg/m  CONSTITUTIONAL: Well-developed, well-nourished female in no acute distress.  HEENT:  Normocephalic, atraumatic. External right and left ear normal. No scleral icterus.  NECK: Normal range of  motion, supple, no masses noted on observation SKIN: No rash noted. Not diaphoretic. No erythema. No pallor. MUSCULOSKELETAL: Normal range of motion. No edema noted. NEUROLOGIC: Alert and oriented to person, place, and time. Normal muscle tone coordination. No cranial nerve deficit noted. PSYCHIATRIC: Normal mood and affect. Normal behavior. Normal judgment and thought content. CARDIOVASCULAR: Normal heart rate noted RESPIRATORY: Effort and breath sounds normal, no problems with respiration noted ABDOMEN: No masses noted. No other overt distention noted.   PELVIC: Deferred  Labs and Imaging US PELVIC COMPLETE WITH TRANSVAGINAL  Result Date: 08/08/2023 CLINICAL DATA:   Complex right adnexal cyst EXAM: TRANSABDOMINAL AND TRANSVAGINAL ULTRASOUND OF PELVIS TECHNIQUE: Both transabdominal and transvaginal ultrasound examinations of the pelvis were performed. Transabdominal technique was performed for global imaging of the pelvis including uterus, ovaries, adnexal regions, and pelvic cul-de-sac. It was necessary to proceed with endovaginal exam following the transabdominal exam to visualize the endometrium and adnexal structures. COMPARISON:  05/31/2023, 06/12/2023 FINDINGS: Uterus Measurements: 7.4 x 3.3 x 4.4 cm = volume: 56.4 mL. No fibroids or other mass visualized. Endometrium Thickness: 11 mm. Diffuse heterogeneity of the endometrium is again noted with multiple cystic areas and increased vascularity again identified. Within the lower uterine segment on the right, there is a 1.1 x 1.2 x 1.1 cm hypoechoic mass, which could reflect a submucosal fibroid versus endometrial lesion. Right ovary Measurements: 3.7 x 2.0 x 2.7 cm = volume: 10.1 mL. Since the prior ultrasound, the cystic areas within the right adnexa have decreased in size. There are 2 residual contiguous simple appearing cysts, largest measuring 1.7 x 2.3 x 2.7 cm and the smaller measuring 1.1 x 1.4 x 1.3 cm. No mural nodularity or internal septations are identified. Left ovary Measurements: 2.3 x 2.0 x 1.6 cm = volume: 3.8 mL. Simple cyst measuring 1.4 x 1.5 x 1.4 cm is again identified, not appreciably changed. Other findings No abnormal free fluid. IMPRESSION: 1. Continued diffuse heterogeneity of the endometrium, with multiple cystic areas and increased vascularity concerning for endometrial neoplasm. Please see previous MRI report recommending endometrial sampling. 2. 1.2 cm hypoechoic mass within the right aspect of the lower uterine segment, which may reflect a submucosal fibroid or endometrial lesion. Again, endometrial sampling recommended. 3. Decreased size of the cystic areas within the right adnexa, with 2  small residual simple appearing cysts as above. Stable simple left adnexal cyst. Given prior imaging features, these are likely benign. Electronically Signed   By: Sharlet Salina M.D.   On: 08/08/2023 15:20      Assessment and Plan:     1. Thickened endometrium Reviewed ultrasound findings.  Discussed possibly doing Hysteroscopy, D&C to get more tissue, patient declines this at this point. She is reassured by benign endometrial biopsy done on 07/04/2023. Talked about being able to do targeted biopsy of endometrial lesion, but she is asymptomatic and feels the endometrial thickening was a result of Tamoxifen use, which she has stopped for several months.  She desires repeat ultrasound in January 2025.  This was ordered.  PMB precautions emphasized.  - US PELVIC COMPLETE WITH TRANSVAGINAL; Future  2. Cyst of right ovary Cysts are smaller, negative ROMA, this is reassured. Will follow up on repeat scan.  - US PELVIC COMPLETE WITH TRANSVAGINAL; Future  Routine preventative health maintenance measures emphasized. Please refer to After Visit Summary for other counseling recommendations.   Return for follow up as recommended.    I spent 30 minutes dedicated to the care of this patient including  pre-visit review of records, face to face time with the patient discussing her conditions and treatments and post visit orders.    Jaynie Collins, MD, FACOG Obstetrician & Gynecologist, [facility] for [employer], [facility]

## 2023-08-30 ENCOUNTER — Telehealth: Payer: Self-pay | Admitting: Family

## 2023-08-30 DIAGNOSIS — G250 Essential tremor: Secondary | ICD-10-CM

## 2023-08-30 MED ORDER — TOPIRAMATE 25 MG PO TABS
25.0000 mg | ORAL_TABLET | Freq: Two times a day (BID) | ORAL | 1 refills | Status: DC
Start: 2023-08-30 — End: 2024-01-30

## 2023-08-30 NOTE — Telephone Encounter (Signed)
Patient just called and needs a refill on her medication. The name is topiramate (TOPAMAX) 25 MG tablet. The pharmacy she uses is [facility] [phone] Nicholes Rough, [facility] - [address] [place] [address] Phone: [phone]  Fax: [phone]  Her number is [phone]

## 2023-08-30 NOTE — Telephone Encounter (Signed)
Rx sent in pt is aware 

## 2023-09-04 ENCOUNTER — Ambulatory Visit (INDEPENDENT_AMBULATORY_CARE_PROVIDER_SITE_OTHER): Payer: PPO | Admitting: Family

## 2023-09-04 ENCOUNTER — Encounter: Payer: Self-pay | Admitting: Family

## 2023-09-04 VITALS — BP 122/78 | HR 62 | Temp 97.7°F | Resp 97 | Ht 67.5 in | Wt 142.0 lb

## 2023-09-04 DIAGNOSIS — F39 Unspecified mood [affective] disorder: Secondary | ICD-10-CM | POA: Diagnosis not present

## 2023-09-04 NOTE — Assessment & Plan Note (Signed)
Anxiety somewhat improved.  Patient has been on Celexa for 3 weeks, advised that she continue for another couple weeks and if patient was pleased with the medication she can could increase to Celexa 20 mg daily if needed.  Previously patient had been on Zoloft.  May consider change to Zoloft to Celexa ineffective.

## 2023-09-04 NOTE — Progress Notes (Signed)
Assessment & Plan:  Mood disorder [facility]) Assessment & Plan: Anxiety somewhat improved.  Patient has been on Celexa for 3 weeks, advised that she continue for another couple weeks and if patient was pleased with the medication she can could increase to Celexa 20 mg daily if needed.  Previously patient had been on Zoloft.  May consider change to Zoloft to Celexa ineffective.        Return precautions given.   Risks, benefits, and alternatives of the medications and treatment plan prescribed today were discussed, and patient expressed understanding.   Education regarding symptom management and diagnosis given to patient on AVS either electronically or printed.  Return in about 6 months (around 03/03/2024).  Rennie Plowman, FNP  Subjective:    Patient ID: Susan [name], female    DOB: 1951-02-02, 72 y.o.   MRN: [id number]  CC: Susan [name] is a 72 y.o. female who presents today for follow up.   HPI: Complains of increased anxiety  Husband with early signs of dementia. It has been a stressful time and he has been more agitated.      Started on celexa 10mg  3 weeks ago. She takes xanax 0.5mg  at bedtime prn.   Left breast cancer- following with Dr Smith Robert   Allergies: Patient has no known allergies. Current Outpatient Medications on File Prior to Visit  Medication Sig Dispense Refill   ALPRAZolam (XANAX) 0.5 MG tablet Take 1 tablet (0.5 mg total) by mouth at bedtime as needed. for sleep 30 tablet 0   calcium citrate-vitamin D 500-500 MG-UNIT chewable tablet Chew 1 tablet by mouth 2 (two) times daily.     cholecalciferol (VITAMIN D) 1000 units tablet Take 1,000 Units by mouth daily.     citalopram (CELEXA) 10 MG tablet Take 1 tablet (10 mg total) by mouth daily. 30 tablet 0   clobetasol ointment (TEMOVATE) 0.05 % Apply to affected area every night for 4 weeks, then every other day for 4 weeks and then twice a week for 4 weeks or until resolution. 30 g 5   cyclobenzaprine  (FLEXERIL) 5 MG tablet Take 1-2 tablets (5-10 mg total) by mouth at bedtime as needed for muscle spasms. 30 tablet 1   famotidine (PEPCID) 20 MG tablet Take 20 mg by mouth as needed for heartburn or indigestion.     fluticasone (FLONASE) 50 MCG/ACT nasal spray Place 2 sprays into both nostrils as needed.      Ivermectin 1 % CREA Apply 1 Application topically at bedtime. 30 g 11   meloxicam (MOBIC) 15 MG tablet TAKE 1 TABLET (15 MG TOTAL) BY MOUTH DAILY. 90 tablet 1   metoprolol succinate (TOPROL-XL) 25 MG 24 hr tablet TAKE 1/2 TABLET BY MOUTH DAILY 45 tablet 3   Multiple Vitamin (MULTIVITAMIN) tablet Take 1 tablet by mouth daily.     psyllium (REGULOID) 0.52 g capsule Take 0.52 g by mouth 3 (three) times daily.      SIMETHICONE-80 PO Take 80 mg by mouth as needed (gas).      topiramate (TOPAMAX) 25 MG tablet Take 1 tablet (25 mg total) by mouth 2 (two) times daily. 180 tablet 1   No current facility-administered medications on file prior to visit.    Review of Systems  Constitutional:  Negative for chills and fever.  Respiratory:  Negative for cough.   Cardiovascular:  Negative for chest pain and palpitations.  Gastrointestinal:  Negative for nausea and vomiting.  Psychiatric/Behavioral:  The patient is nervous/anxious.  Objective:    BP 122/78   Pulse 62   Temp 97.7 F (36.5 C) (Oral)   Resp (!) 97   Ht 5' 7.5" (1.715 m)   Wt 142 lb (64.4 kg)   BMI 21.91 kg/m  BP Readings from Last 3 Encounters:  09/04/23 122/78  08/24/23 (!) 156/76  08/14/23 118/62   Wt Readings from Last 3 Encounters:  09/04/23 142 lb (64.4 kg)  08/24/23 142 lb 3.2 oz (64.5 kg)  08/14/23 141 lb 6 oz (64.1 kg)      09/04/2023   12:12 PM 08/14/2023    1:07 PM 05/29/2023   10:05 AM  Depression screen PHQ 2/9  Decreased Interest 0 0 0  Down, Depressed, Hopeless 0 0 0  PHQ - 2 Score 0 0 0  Altered sleeping 0 1 1  Tired, decreased energy 0 0 0  Change in appetite 0 0 0  Feeling bad or failure about  yourself  0 0 0  Trouble concentrating 0 0 0  Moving slowly or fidgety/restless 0 0 0  Suicidal thoughts 0 0 0  PHQ-9 Score 0 1 1  Difficult doing work/chores Not difficult at all Somewhat difficult Not difficult at all     Physical Exam Vitals reviewed.  Constitutional:      Appearance: She is well-developed.  Eyes:     Conjunctiva/sclera: Conjunctivae normal.  Cardiovascular:     Rate and Rhythm: Normal rate and regular rhythm.     Pulses: Normal pulses.     Heart sounds: Normal heart sounds.  Pulmonary:     Effort: Pulmonary effort is normal.     Breath sounds: Normal breath sounds. No wheezing, rhonchi or rales.  Skin:    General: Skin is warm and dry.  Neurological:     Mental Status: She is alert.  Psychiatric:        Speech: Speech normal.        Behavior: Behavior normal.        Thought Content: Thought content normal.

## 2023-09-04 NOTE — Patient Instructions (Addendum)
May increase citalopram 10mg  to 20mg  once daily after 6 weeks if needed. Please let me know how you are doing.

## 2023-09-06 ENCOUNTER — Other Ambulatory Visit: Payer: Self-pay | Admitting: Family Medicine

## 2023-09-06 DIAGNOSIS — F39 Unspecified mood [affective] disorder: Secondary | ICD-10-CM

## 2023-09-07 ENCOUNTER — Ambulatory Visit: Payer: PPO | Admitting: Family

## 2023-09-11 ENCOUNTER — Ambulatory Visit
Admission: RE | Admit: 2023-09-11 | Discharge: 2023-09-11 | Disposition: A | Discharge status: Home / Self Care | Payer: PPO | Source: Ambulatory Visit | Attending: Oncology | Admitting: Oncology

## 2023-09-11 DIAGNOSIS — M85852 Other specified disorders of bone density and structure, left thigh: Secondary | ICD-10-CM | POA: Diagnosis not present

## 2023-09-11 DIAGNOSIS — D0512 Intraductal carcinoma in situ of left breast: Secondary | ICD-10-CM | POA: Diagnosis not present

## 2023-09-11 DIAGNOSIS — Z780 Asymptomatic menopausal state: Secondary | ICD-10-CM | POA: Diagnosis not present

## 2023-09-11 DIAGNOSIS — M8589 Other specified disorders of bone density and structure, multiple sites: Secondary | ICD-10-CM | POA: Diagnosis not present

## 2023-09-13 ENCOUNTER — Encounter: Payer: Self-pay | Admitting: Dermatology

## 2023-09-13 ENCOUNTER — Ambulatory Visit: Payer: PPO | Admitting: Dermatology

## 2023-09-13 VITALS — BP 151/80

## 2023-09-13 DIAGNOSIS — L578 Other skin changes due to chronic exposure to nonionizing radiation: Secondary | ICD-10-CM

## 2023-09-13 DIAGNOSIS — L570 Actinic keratosis: Secondary | ICD-10-CM

## 2023-09-13 DIAGNOSIS — D1801 Hemangioma of skin and subcutaneous tissue: Secondary | ICD-10-CM | POA: Diagnosis not present

## 2023-09-13 DIAGNOSIS — Z8589 Personal history of malignant neoplasm of other organs and systems: Secondary | ICD-10-CM

## 2023-09-13 DIAGNOSIS — Z1283 Encounter for screening for malignant neoplasm of skin: Secondary | ICD-10-CM | POA: Diagnosis not present

## 2023-09-13 DIAGNOSIS — Z79899 Other long term (current) drug therapy: Secondary | ICD-10-CM

## 2023-09-13 DIAGNOSIS — W908XXA Exposure to other nonionizing radiation, initial encounter: Secondary | ICD-10-CM

## 2023-09-13 DIAGNOSIS — D692 Other nonthrombocytopenic purpura: Secondary | ICD-10-CM | POA: Diagnosis not present

## 2023-09-13 DIAGNOSIS — Z85828 Personal history of other malignant neoplasm of skin: Secondary | ICD-10-CM | POA: Diagnosis not present

## 2023-09-13 DIAGNOSIS — L814 Other melanin hyperpigmentation: Secondary | ICD-10-CM

## 2023-09-13 DIAGNOSIS — L821 Other seborrheic keratosis: Secondary | ICD-10-CM | POA: Diagnosis not present

## 2023-09-13 DIAGNOSIS — D229 Melanocytic nevi, unspecified: Secondary | ICD-10-CM

## 2023-09-13 DIAGNOSIS — Z853 Personal history of malignant neoplasm of breast: Secondary | ICD-10-CM

## 2023-09-13 DIAGNOSIS — L719 Rosacea, unspecified: Secondary | ICD-10-CM | POA: Diagnosis not present

## 2023-09-13 DIAGNOSIS — I781 Nevus, non-neoplastic: Secondary | ICD-10-CM | POA: Diagnosis not present

## 2023-09-13 DIAGNOSIS — Z7189 Other specified counseling: Secondary | ICD-10-CM

## 2023-09-13 DIAGNOSIS — Z872 Personal history of diseases of the skin and subcutaneous tissue: Secondary | ICD-10-CM

## 2023-09-13 MED ORDER — IVERMECTIN 1 % EX CREA: 1.0000 | TOPICAL_CREAM | Freq: Every day | CUTANEOUS | 11 refills | Status: DC

## 2023-09-13 NOTE — Progress Notes (Signed)
Follow-Up Visit   Subjective  Susan [name] is a 72 y.o. female who presents for the following: Skin Cancer Screening and Full Body Skin Exam, hx of BCC, SCC, Aks, Rosacea, SM Triple cream qhs, check spot R cheek 42m hx   The patient presents for Total-Body Skin Exam (TBSE) for skin cancer screening and mole check. The patient has spots, moles and lesions to be evaluated, some may be new or changing and the patient may have concern these could be cancer.    The following portions of the chart were reviewed this encounter and updated as appropriate: medications, allergies, medical history  Review of Systems:  No other skin or systemic complaints except as noted in HPI or Assessment and Plan.  Objective  Well appearing patient in no apparent distress; mood and affect are within normal limits.  A full examination was performed including scalp, head, eyes, ears, nose, lips, neck, chest, axillae, abdomen, back, buttocks, bilateral upper extremities, bilateral lower extremities, hands, feet, fingers, toes, fingernails, and toenails. All findings within normal limits unless otherwise noted below.   Relevant physical exam findings are noted in the Assessment and Plan.  R cheek x 1 Pink scaly macules    Assessment & Plan   SKIN CANCER SCREENING PERFORMED TODAY.  ACTINIC DAMAGE - Chronic condition, secondary to cumulative UV/sun exposure - diffuse scaly erythematous macules with underlying dyspigmentation - Recommend daily broad spectrum sunscreen SPF 30+ to sun-exposed areas, reapply every 2 hours as needed.  - Staying in the shade or wearing long sleeves, sun glasses (UVA+UVB protection) and wide brim hats (4-inch brim around the entire circumference of the hat) are also recommended for sun protection.  - Call for new or changing lesions.  LENTIGINES, SEBORRHEIC KERATOSES, HEMANGIOMAS - Benign normal skin lesions - Benign-appearing - Call for any changes  MELANOCYTIC NEVI -  Tan-brown and/or pink-flesh-colored symmetric macules and papules - Benign appearing on exam today - Observation - Call clinic for new or changing moles - Recommend daily use of broad spectrum spf 30+ sunscreen to sun-exposed areas.   HISTORY OF BASAL CELL CARCINOMA OF THE SKIN - No evidence of recurrence today - Recommend regular full body skin exams - Recommend daily broad spectrum sunscreen SPF 30+ to sun-exposed areas, reapply every 2 hours as needed.  - Call if any new or changing lesions are noted between office visits  - R chest parasternal, L nose supratip  HISTORY OF SQUAMOUS CELL CARCINOMA OF THE SKIN - No evidence of recurrence today - No lymphadenopathy - Recommend regular full body skin exams - Recommend daily broad spectrum sunscreen SPF 30+ to sun-exposed areas, reapply every 2 hours as needed.  - Call if any new or changing lesions are noted between office visits - R prox lat bicep   Hx of Breast Cancer - L breast - Clear to visual exam - No lymphadenopathy  Spider Veins legs - Dilated blue, purple or red veins at the lower extremities - Reassured - Smaller vessels can be treated by sclerotherapy (a procedure to inject a medicine into the veins to make them disappear) if desired, but the treatment is not covered by insurance. Larger vessels may be covered if symptomatic and we would refer to vascular surgeon if treatment desired.   ROSACEA face Exam Mild erythema face  Chronic condition with duration or expected duration over one year. Currently well-controlled.   Rosacea is a chronic progressive skin condition usually affecting the face of adults, causing redness and/or acne bumps.  It is treatable but not curable. It sometimes affects the eyes (ocular rosacea) as well. It may respond to topical and/or systemic medication and can flare with stress, sun exposure, alcohol, exercise, topical steroids (including hydrocortisone/cortisone 10) and some foods.  Daily  application of broad spectrum spf 30+ sunscreen to face is recommended to reduce flares.  Patient denies grittiness of the eyes  Treatment Plan Cont SM Triple cream qhs to face  Long term medication management.  Patient is using long term (months to years) prescription medication  to control their dermatologic condition.  These medications require periodic monitoring to evaluate for efficacy and side effects and may require periodic laboratory monitoring.    AK (actinic keratosis) R cheek x 1  Actinic keratoses are precancerous spots that appear secondary to cumulative UV radiation exposure/sun exposure over time. They are chronic with expected duration over 1 year. A portion of actinic keratoses will progress to squamous cell carcinoma of the skin. It is not possible to reliably predict which spots will progress to skin cancer and so treatment is recommended to prevent development of skin cancer.  Recommend daily broad spectrum sunscreen SPF 30+ to sun-exposed areas, reapply every 2 hours as needed.  Recommend staying in the shade or wearing long sleeves, sun glasses (UVA+UVB protection) and wide brim hats (4-inch brim around the entire circumference of the hat). Call for new or changing lesions.  Destruction of lesion - R cheek x 1 Complexity: simple   Destruction method: cryotherapy   Informed consent: discussed and consent obtained   Timeout:  patient name, date of birth, surgical site, and procedure verified Lesion destroyed using liquid nitrogen: Yes   Region frozen until ice ball extended beyond lesion: Yes   Outcome: patient tolerated procedure well with no complications   Post-procedure details: wound care instructions given     Purpura - Chronic; persistent and recurrent.  Treatable, but not curable. - Violaceous macules and patches - Benign - Related to trauma, age, sun damage and/or use of blood thinners, chronic use of topical and/or oral steroids - Observe - Can use  OTC arnica containing moisturizer such as Dermend Bruise Formula if desired - Call for worsening or other concerns  - arms  Return in about 1 year (around 09/12/2024) for TBSE, Hx of BCC, Hx of SCC, Hx of AKs.  I, Ardis Rowan, RMA, am acting as scribe for Armida Sans, MD .   Documentation: I have reviewed the above documentation for accuracy and completeness, and I agree with the above.  Armida Sans, MD

## 2023-09-13 NOTE — Patient Instructions (Addendum)
Cryotherapy Aftercare  Wash gently with soap and water everyday.   Apply Vaseline and Band-Aid daily until healed.   Instructions for [facility] Medications  One or more of your medications was sent to the [facility] mail order compounding pharmacy. You will receive an email from them and can purchase the medicine through that link. It will then be mailed to your home at the address you confirmed. If for any reason you do not receive an email from them, please check your spam folder. If you still do not find the email, please let us know. [facility] phone number is [phone].     Due to recent changes in healthcare laws, you may see results of your pathology and/or laboratory studies on MyChart before the doctors have had a chance to review them. We understand that in some cases there may be results that are confusing or concerning to you. Please understand that not all results are received at the same time and often the doctors may need to interpret multiple results in order to provide you with the best plan of care or course of treatment. Therefore, we ask that you please give [place] 2 business days to thoroughly review all your results before contacting the office for clarification. Should we see a critical lab result, you will be contacted sooner.   If You Need Anything After Your Visit  If you have any questions or concerns for your doctor, please call our main line at [phone] and press option 4 to reach your doctor's medical assistant. If no one answers, please leave a voicemail as directed and we will return your call as soon as possible. Messages left after 4 pm will be answered the following business day.   You may also send [place] a message via MyChart. We typically respond to MyChart messages within 1-2 business days.  For prescription refills, please ask your pharmacy to contact our office. Our fax number is [phone].  If you have an urgent issue when the clinic  is closed that cannot wait until the next business day, you can page your doctor at the number below.    Please note that while we do our best to be available for urgent issues outside of office hours, we are not available 24/7.   If you have an urgent issue and are unable to reach [place], you may choose to seek medical care at your doctor's office, retail clinic, urgent care center, or emergency room.  If you have a medical emergency, please immediately call 911 or go to the emergency department.  Pager Numbers  - Dr. Gwen Pounds: [phone]  - Dr. Roseanne Reno: [phone]  - Dr. Katrinka Blazing: [phone]   In the event of inclement weather, please call our main line at [phone] for an update on the status of any delays or closures.  Dermatology Medication Tips: Please keep the boxes that topical medications come in in order to help keep track of the instructions about where and how to use these. Pharmacies typically print the medication instructions only on the boxes and not directly on the medication tubes.   If your medication is too expensive, please contact our office at [phone] option 4 or send [place] a message through MyChart.   We are unable to tell what your co-pay for medications will be in advance as this is different depending on your insurance coverage. However, we may be able to find a substitute medication at lower cost or fill out paperwork to get insurance to  cover a needed medication.   If a prior authorization is required to get your medication covered by your insurance company, please allow [place] 1-2 business days to complete this process.  Drug prices often vary depending on where the prescription is filled and some pharmacies may offer cheaper prices.  The website www.goodrx.com contains coupons for medications through different pharmacies. The prices here do not account for what the cost may be with help from insurance (it may be cheaper with your insurance), but the website  can give you the price if you did not use any insurance.  - You can print the associated coupon and take it with your prescription to the pharmacy.  - You may also stop by our office during regular business hours and pick up a GoodRx coupon card.  - If you need your prescription sent electronically to a different pharmacy, notify our office through [facility] or by phone at [phone] option 4.     Si Usted Necesita Algo Despus de Su Visita  Tambin puede enviarnos un mensaje a travs de Clinical cytogeneticist. Por lo general respondemos a los mensajes de MyChart en el transcurso de 1 a 2 das hbiles.  Para renovar recetas, por favor pida a su farmacia que se ponga en contacto con nuestra oficina. Annie Sable de fax es [place] [phone].  Si tiene un asunto urgente cuando la clnica est cerrada y que no puede esperar hasta el siguiente da hbil, puede llamar/localizar a su doctor(a) al nmero que aparece a continuacin.   Por favor, tenga en cuenta que aunque hacemos todo lo posible para estar disponibles para asuntos urgentes fuera del horario de [place], no estamos disponibles las 24 horas del da, los 7 [address]  [address] de la [place].   Si tiene un problema urgente y no puede comunicarse con nosotros, puede optar por buscar atencin mdica  en el consultorio de su doctor(a), en una clnica privada, en un centro de atencin urgente o en una sala de emergencias.  Si tiene Engineer, drilling, por favor llame inmediatamente al 911 o vaya a la sala de emergencias.  Nmeros de bper  - Dr. Gwen Pounds: [phone]  - Dra. Roseanne Reno: [phone]  - Dr. Katrinka Blazing: [phone]   En caso de inclemencias del tiempo, por favor llame a Lacy Duverney principal al [phone] para una actualizacin sobre el [place] de cualquier retraso o cierre.  Consejos para la medicacin en dermatologa: Por favor, guarde las cajas en las que vienen los medicamentos de uso tpico para ayudarle a seguir las instrucciones  sobre dnde y cmo usarlos. Las farmacias generalmente imprimen las instrucciones del medicamento slo en las cajas y no directamente en los tubos del [place].   Si su medicamento es muy caro, por favor, pngase en contacto con Rolm Gala llamando al [phone] y presione la opcin 4 o envenos un mensaje a travs de Clinical cytogeneticist.   No podemos decirle cul ser su copago por los medicamentos por adelantado ya que esto es diferente dependiendo de la cobertura de su seguro. Sin embargo, es posible que podamos encontrar un medicamento sustituto a Audiological scientist un formulario para que el seguro cubra el medicamento que se considera necesario.   Si se requiere una autorizacin previa para que su compaa de seguros [place] su medicamento, por favor permtanos de 1 a 2 das hbiles para completar [address]  Los precios de los medicamentos varan con frecuencia dependiendo del Environmental consultant de dnde se surte la receta y alguna farmacias pueden ofrecer precios  ms baratos.  El sitio web www.goodrx.com tiene cupones para medicamentos de Health and safety inspector. Los precios aqu no tienen en cuenta lo que podra costar con la ayuda del seguro (puede ser ms barato con su seguro), pero el sitio web puede darle el precio si no utiliz Tourist information centre manager.  - Puede imprimir el cupn correspondiente y llevarlo con su receta a la farmacia.  - Tambin puede pasar por nuestra oficina durante el horario de atencin regular y Education officer, museum una tarjeta de cupones de GoodRx.  - Si necesita que su receta se enve electrnicamente a una farmacia diferente, informe a nuestra oficina a travs de MyChart de [place] o por telfono llamando al [phone] y presione la opcin 4.

## 2023-09-14 ENCOUNTER — Encounter: Payer: Self-pay | Admitting: Family

## 2023-09-14 ENCOUNTER — Other Ambulatory Visit: Payer: Self-pay | Admitting: Family

## 2023-09-14 DIAGNOSIS — F39 Unspecified mood [affective] disorder: Secondary | ICD-10-CM

## 2023-09-14 MED ORDER — CITALOPRAM HYDROBROMIDE 20 MG PO TABS: 20.0000 mg | ORAL_TABLET | Freq: Every day | ORAL | 3 refills | Status: DC

## 2023-09-15 ENCOUNTER — Encounter: Payer: Self-pay | Admitting: Dermatology

## 2023-10-06 ENCOUNTER — Ambulatory Visit: Payer: PPO | Admitting: Family

## 2023-10-12 ENCOUNTER — Other Ambulatory Visit: Payer: Self-pay | Admitting: Obstetrics & Gynecology

## 2023-10-12 DIAGNOSIS — L900 Lichen sclerosus et atrophicus: Secondary | ICD-10-CM

## 2024-01-04 ENCOUNTER — Ambulatory Visit
Admission: RE | Admit: 2024-01-04 | Discharge: 2024-01-04 | Disposition: A | Discharge status: Home / Self Care | Payer: PPO | Source: Ambulatory Visit | Attending: Obstetrics & Gynecology | Admitting: Obstetrics & Gynecology

## 2024-01-04 ENCOUNTER — Other Ambulatory Visit: Payer: Self-pay | Admitting: Family

## 2024-01-04 DIAGNOSIS — N83201 Unspecified ovarian cyst, right side: Secondary | ICD-10-CM | POA: Diagnosis not present

## 2024-01-04 DIAGNOSIS — N838 Other noninflammatory disorders of ovary, fallopian tube and broad ligament: Secondary | ICD-10-CM | POA: Diagnosis not present

## 2024-01-04 DIAGNOSIS — N854 Malposition of uterus: Secondary | ICD-10-CM | POA: Diagnosis not present

## 2024-01-04 DIAGNOSIS — D251 Intramural leiomyoma of uterus: Secondary | ICD-10-CM | POA: Diagnosis not present

## 2024-01-04 DIAGNOSIS — G8929 Other chronic pain: Secondary | ICD-10-CM

## 2024-01-04 DIAGNOSIS — R9389 Abnormal findings on diagnostic imaging of other specified body structures: Secondary | ICD-10-CM | POA: Insufficient documentation

## 2024-01-08 ENCOUNTER — Inpatient Hospital Stay: Payer: PPO | Attending: Oncology | Admitting: Oncology

## 2024-01-08 ENCOUNTER — Encounter: Payer: Self-pay | Admitting: Oncology

## 2024-01-08 ENCOUNTER — Other Ambulatory Visit: Payer: Self-pay | Admitting: *Deleted

## 2024-01-08 VITALS — BP 145/75 | HR 59 | Temp 97.8°F | Resp 18 | Wt 135.0 lb

## 2024-01-08 DIAGNOSIS — Z86000 Personal history of in-situ neoplasm of breast: Secondary | ICD-10-CM | POA: Diagnosis not present

## 2024-01-08 DIAGNOSIS — M85852 Other specified disorders of bone density and structure, left thigh: Secondary | ICD-10-CM | POA: Diagnosis not present

## 2024-01-08 DIAGNOSIS — Z08 Encounter for follow-up examination after completed treatment for malignant neoplasm: Secondary | ICD-10-CM | POA: Diagnosis not present

## 2024-01-08 DIAGNOSIS — D0512 Intraductal carcinoma in situ of left breast: Secondary | ICD-10-CM

## 2024-01-08 NOTE — Progress Notes (Signed)
 Hematology/Oncology Consult note [facility]  [phone] [phone]) [phone]  Patient Care Team: Dineen Rollene MATSU, FNP as PCP - General (Family Medicine) Perla Evalene PARAS, MD as PCP - Cardiology (Cardiology) Bluford Dorn Holts, MD as Referring Physician (Dermatology) Melanee Annah BROCKS, MD as Consulting Physician (Oncology)   Name of the patient: Susan [name]  [id number]  06-Jan-1951   Date of visit: 01/08/24  Diagnosis- left breast DCIS ER positive   Chief complaint/ Reason for visit-  routine follow-up of DCIS  Heme/Onc history: Patient is a 73 year old female diagnosed with 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  2 adjacent groups of heterogeneous calcifications spanning 1.1 x 2 x 2.4 cm.  She underwent lumpectomy in July 2019.  Pathology showed 2.8 cm grade 2-3 DCIS with sclerosing adenosis.  ER greater than 90% positive and PR negative.  She then underwent adjuvant radiation treatment and began tamoxifen  in September 2019.  Patient has baseline osteopenia with a T score of -1.8 in the left femoral neck.  Tamoxifen  was stopped sometime in June 2024 due to concerns of endometrial hyperplasia and ovarian cyst.  Biopsies negative for malignancy   Interval history-patient continues to follow-up with GYN regarding her history of endometrial hyperplasia and had a repeat ultrasound done recently the results of which are pending.  She denies any breast concerns at this time  ECOG PS- 1 Pain scale- 0   Review of systems- Review of Systems  Constitutional:  Negative for chills, fever, malaise/fatigue and weight loss.  HENT:  Negative for congestion, ear discharge and nosebleeds.   Eyes:  Negative for blurred vision.  Respiratory:  Negative for cough, hemoptysis, sputum production, shortness of breath and wheezing.   Cardiovascular:  Negative for chest pain, palpitations, orthopnea and claudication.  Gastrointestinal:  Negative for abdominal  pain, blood in stool, constipation, diarrhea, heartburn, melena, nausea and vomiting.  Genitourinary:  Negative for dysuria, flank pain, frequency, hematuria and urgency.  Musculoskeletal:  Negative for back pain, joint pain and myalgias.  Skin:  Negative for rash.  Neurological:  Negative for dizziness, tingling, focal weakness, seizures, weakness and headaches.  Endo/Heme/Allergies:  Does not bruise/bleed easily.  Psychiatric/Behavioral:  Negative for depression and suicidal ideas. The patient does not have insomnia.       No Known Allergies   Past Medical History:  Diagnosis Date   Actinic keratosis    Arthritis    Basal cell carcinoma 05/26/2016   R chest parasternal   Basal cell carcinoma 01/21/2015   L nose supratip    Breast cancer (HCC)    Left DCIS   Cancer (HCC)    Complication of anesthesia    Dysrhythmia    PSVT on metoprolol    Family history of breast cancer    Gastric reflux 90's   GERD (gastroesophageal reflux disease)    Headache    H/O MIGRAINES   Hiatal hernia 2003   History of Coronary CT for Calcium Scoring    a. 11/2018 CT Cor Ca2+ = Zero.   Hypertension    Mitral regurgitation    a. 03/2017 Echo: EF 55-60%, no nrwma, Mod MR; b. 03/2018 Echo: EF 60-65%, no rwma. Gr2 DD. Mild MV prolapse of ant leaflet w/ mild to mod mR. Nl RV fxn. Mild to mod TR.   Personal history of radiation therapy 2019   LEFT lumpectomy   PONV (postoperative nausea and vomiting)    N/V   PSVT (paroxysmal supraventricular tachycardia) (HCC)  a. 03/2017 Event Monitor: Avg HR 66, 10 episodes of SVT up to 42.8 secs, fastest 148 bpm. 8 beats WCT (179 bpm) - VT vs SVT w/ aberrancy. Rare PACs (2.6% total beats).   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   R prox lat bicep     Past Surgical History:  Procedure Laterality Date   AUGMENTATION MAMMAPLASTY Bilateral 01/17/2020   BREAST BIOPSY Left 06/13/2018   Affirm Bx- coil clip   atypical sclerosing and DCIS   BREAST  ENHANCEMENT SURGERY Bilateral 01/17/2020   Procedure: MAMMOPLASTY AUGMENTATION WITH PROSTHESIS (BREAST);  Surgeon: Elisabeth Craig RAMAN, MD;  Location: [facility];  Service: Plastics;  Laterality: Bilateral;  2.5 hours for total case   BREAST EXCISIONAL BIOPSY Right 2000   benign   BREAST LUMPECTOMY WITH NEEDLE LOCALIZATION Left 06/26/2018   Procedure: BREAST LUMPECTOMY WITH NEEDLE LOCALIZATION;  Surgeon: Jordis Laneta FALCON, MD;  Location: [facility];  Service: General;  Laterality: Left;   CHOLECYSTECTOMY     OVARIAN CYST SURGERY     SCAR REVISION Left 01/17/2020   Procedure: POSSIBLE SCAR REVISION TO LEFT BREAST;  Surgeon: Elisabeth Craig RAMAN, MD;  Location: [facility];  Service: Plastics;  Laterality: Left;   SKIN FULL THICKNESS GRAFT  01/17/2020   Procedure: SKIN GRAFT FULL THICKNESS;  Surgeon: Elisabeth Craig RAMAN, MD;  Location: [facility];  Service: Plastics;;   TONSILLECTOMY     age 19   UPPER GI ENDOSCOPY  2003    Social History   Socioeconomic History   Marital status: Married    Spouse name: Richard   Number of children: 2   Years of education: [employer] education level: Bachelor's degree (e.g., BA, AB, BS)  Occupational History   Occupation: retired  Tobacco Use   Smoking status: Never   Smokeless tobacco: Never  Vaping Use   Vaping status: Never Used  Substance and Sexual Activity   Alcohol use: Yes    Alcohol/week: 2.0 standard drinks of alcohol    Types: 2 Glasses of wine per week    Comment: daily WINE   Drug use: No   Sexual activity: Not Currently    Birth control/protection: Post-menopausal  Other Topics Concern   Not on file  Social History Narrative   Married       From: the area, [employer]   Living: with husband Charlie [phone])   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      Family: 2 step children - Rollene and [place] - 3 grandchildren, good relationships      Enjoys: garden, flowers      Exercise: walking,  walking the dog It Sales Professional doodle) - daily, average 6 mile/day   Diet: fairly good       Safety   Seat belts: Yes    Guns: Yes  and secure   Safe in relationships: Yes    Social Drivers of Corporate Investment Banker Strain: Low Risk  (05/29/2023)   Overall Financial Resource Strain (CARDIA)    Difficulty of Paying Living Expenses: Not hard at all  Food Insecurity: No Food Insecurity (05/29/2023)   Hunger Vital Sign    Worried About Running Out of Food in the Last Year: Never true    Ran Out of Food in the Last Year: Never true  Transportation Needs: No Transportation Needs (05/29/2023)   PRAPARE - Administrator, Civil Service (Medical): No    Lack of Transportation (Non-Medical): No  Physical Activity: Sufficiently Active (05/29/2023)   Exercise Vital Sign    Days of Exercise per Week: 7 days    Minutes of Exercise per Session: 40 min  Stress: No Stress Concern Present (05/29/2023)   [employer] of Occupational Health - Occupational Stress Questionnaire    Feeling of Stress : Only a little  Social Connections: Moderately Isolated (05/29/2023)   Social Connection and Isolation Panel [NHANES]    Frequency of Communication with Friends and Family: More than three times a week    Frequency of Social Gatherings with Friends and Family: More than three times a week    Attends Religious Services: Never    Database Administrator or Organizations: No    Attends Engineer, Structural: Not on file    Marital Status: Married  Catering Manager Violence: Not At Risk (01/27/2022)   Humiliation, Afraid, Rape, and Kick questionnaire    Fear of Current or Ex-Partner: No    Emotionally Abused: No    Physically Abused: No    Sexually Abused: No    Family History  Problem Relation Age of Onset   Hyperlipidemia Mother    Hypertension Mother    COPD Mother    Heart failure Mother        deceased 36   Stroke Father 5   Hypertension Father    Hypertension Sister     Hypertension Brother    Healthy Brother    Melanoma Paternal Uncle    Breast cancer Cousin 73       daughter of paternal aunt; currently 44   Breast cancer Cousin 54       daughter of paternal uncle; currently 65   Breast cancer Cousin 54       daughter of maternal uncle; deceased 4   Breast cancer Cousin 71       daughter of same maternal uncle; currently 70   Testicular cancer Other 59       sister's son; currently 4   Coronary artery disease Neg Hx      Current Outpatient Medications:    ALPRAZolam  (XANAX ) 0.5 MG tablet, Take 1 tablet (0.5 mg total) by mouth at bedtime as needed. for sleep, Disp: 30 tablet, Rfl: 0   calcium citrate-vitamin D  500-500 MG-UNIT chewable tablet, Chew 1 tablet by mouth 2 (two) times daily., Disp: , Rfl:    cholecalciferol (VITAMIN D ) 1000 units tablet, Take 1,000 Units by mouth daily., Disp: , Rfl:    citalopram  (CELEXA ) 20 MG tablet, Take 1 tablet (20 mg total) by mouth daily., Disp: 90 tablet, Rfl: 3   clobetasol  ointment (TEMOVATE ) 0.05 %, APPLY TO AFFECTED AREA EVERY NIGHT FOR 4 WEEKS, THEN EVERY OTHER DAY FOR 4 WEEKS AND THEN TWICE A WEEK FOR 4 WEEKS OR UNTIL RESOLUTION., Disp: 30 g, Rfl: 5   cyclobenzaprine  (FLEXERIL ) 5 MG tablet, Take 1-2 tablets (5-10 mg total) by mouth at bedtime as needed for muscle spasms., Disp: 30 tablet, Rfl: 1   famotidine (PEPCID) 20 MG tablet, Take 20 mg by mouth as needed for heartburn or indigestion., Disp: , Rfl:    fluticasone (FLONASE) 50 MCG/ACT nasal spray, Place 2 sprays into both nostrils as needed. , Disp: , Rfl:    Ivermectin  1 % CREA, Apply 1 Application topically at bedtime. qhs to face for Rosacea, Disp: 30 g, Rfl: 11   meloxicam  (MOBIC ) 15 MG tablet, Take 1 tablet (15 mg total) by mouth at bedtime. Use sparingly. Take with food., Disp:  90 tablet, Rfl: 1   metoprolol  succinate (TOPROL -XL) 25 MG 24 hr tablet, TAKE 1/2 TABLET BY MOUTH DAILY, Disp: 45 tablet, Rfl: 3   Multiple Vitamin (MULTIVITAMIN) tablet,  Take 1 tablet by mouth daily., Disp: , Rfl:    psyllium (REGULOID) 0.52 g capsule, Take 0.52 g by mouth 3 (three) times daily. , Disp: , Rfl:    SIMETHICONE-80 PO, Take 80 mg by mouth as needed (gas). , Disp: , Rfl:    topiramate  (TOPAMAX ) 25 MG tablet, Take 1 tablet (25 mg total) by mouth 2 (two) times daily., Disp: 180 tablet, Rfl: 1  Physical exam:  Vitals:   01/08/24 1057  BP: (!) 145/75  Pulse: (!) 59  Resp: 18  Temp: 97.8 F (36.6 C)  TempSrc: Tympanic  SpO2: 99%  Weight: 135 lb (61.2 kg)   Physical Exam Cardiovascular:     Rate and Rhythm: Normal rate and regular rhythm.     Heart sounds: Normal heart sounds.  Pulmonary:     Effort: Pulmonary effort is normal.     Breath sounds: Normal breath sounds.  Skin:    General: Skin is warm and dry.  Neurological:     Mental Status: She is alert and oriented to person, place, and time.    Breast exam was performed in seated and lying down position. Patient is status post left lumpectomy with a well-healed surgical scar. No evidence of any palpable masses. No evidence of axillary adenopathy. No evidence of any palpable masses or lumps in the right breast. No evidence of right axillary adenopathy      Latest Ref Rng & Units 05/29/2023   12:39 PM  CMP  Creatinine 0.44 - 1.00 mg/dL 9.19       Latest Ref Rng & Units 08/12/2022    9:57 AM  CBC  WBC 4.0 - 10.5 K/uL 5.6   Hemoglobin 12.0 - 15.0 g/dL 85.5   Hematocrit 63.9 - 46.0 % 44.4   Platelets 150.0 - 400.0 K/uL 273.0     Assessment and plan- Patient is a 73 y.o. female here for routine follow-up of left breast DCIS ER positive  Patient has completed 5 years of endocrine therapy for her DCIS.  Clinically she is doing well with no concerning signs and symptoms of recurrence based on today's exam.  I will continue to see her on a yearly basis.  She will need mammogram in June 2025.   Visit Diagnosis 1. Encounter for follow-up surveillance of ductal carcinoma in situ (DCIS)  of breast      Dr. Annah Skene, MD, MPH [facility] [phone] 01/08/2024 3:09 PM

## 2024-01-09 ENCOUNTER — Telehealth: Payer: Self-pay | Admitting: *Deleted

## 2024-01-09 NOTE — Telephone Encounter (Signed)
 Attempted to call pt to get her scheduled for a follow up to talk more about her [place] results. Let message that I did schedule her for 01/31/24 at 11:15 and to call or message [place] if that date and time doesn't work.

## 2024-01-09 NOTE — Telephone Encounter (Signed)
-----   Message from Jaynie Collins sent at 01/09/2024 10:31 AM EST ----- Please have patient make appointment to re-discuss evaluation of her abnormal endometrium and continued evaluation of her complex ovarian cyst.  Thank you!  Jaynie Collins, MD

## 2024-01-11 NOTE — Telephone Encounter (Signed)
Spoke to Susan [name] informed her that Meloxicam has been refilled discussed message below Susan [name] stated that she does not take it daily   I refilled meloxicam 15 mg however I wanted to clarify that I do not recommend taking this medication daily for extended period of time.  It is at high dose ibuprofen.  Please make follow-up with me to discuss pain if it a medication that she needs daily

## 2024-01-15 ENCOUNTER — Encounter: Payer: Self-pay | Admitting: Internal Medicine

## 2024-01-15 ENCOUNTER — Ambulatory Visit: Payer: Self-pay | Admitting: Family

## 2024-01-15 ENCOUNTER — Ambulatory Visit (INDEPENDENT_AMBULATORY_CARE_PROVIDER_SITE_OTHER): Payer: PPO | Admitting: Internal Medicine

## 2024-01-15 VITALS — BP 122/76 | HR 76 | Temp 97.3°F | Ht 67.5 in | Wt 130.0 lb

## 2024-01-15 DIAGNOSIS — K529 Noninfective gastroenteritis and colitis, unspecified: Secondary | ICD-10-CM | POA: Insufficient documentation

## 2024-01-15 NOTE — Telephone Encounter (Signed)
Chief Complaint: Diarrhea Symptoms: diarrhea, >7 episodes today Frequency: since Friday night Pertinent Negatives: Patient denies fever, abdominal pain, blood in stool Disposition: [] ED /[] Urgent Care (no appt availability in office) / [x] Appointment(In office/virtual)/ []  [facility]/ [] Home Care/ [] Refused Recommended Disposition /[] [facility]/ []  Follow-up with PCP Additional Notes: Patient has had diarrhea and vomitting since Friday evening, vomitting stopped Saturday, diarrhea continued. States stool is completely "watery and liquid." Has been eating a bland diet and tried pedialyte to stay hydrated. Does endorse feeling "light." Has not tried anti-diarrheal medications. RN advised patient to try anti-diarrheals (Immodium) and to see provider as she is at a greater risk of dehydration due to age and frequency. Patient agreed, appt made for today at 900am, patient verified she can get there in time for check in at 845.       Copied from CRM [id number]. Topic: Clinical - Pink Word Triage >> Jan 15, 2024  8:09 AM Alphonzo Lemmings O wrote: Reason for Triage: patient is calling because patient has been vomiting and diarrhea friday night and saturday morning and couldnt keep anything on my stomach. diarrhea today liquid form. Needs to know what to do or some medical advice Reason for Disposition  [1] SEVERE diarrhea (e.g., 7 or more times / day more than normal) AND [2] present > 24 hours (1 day)  Answer Assessment - Initial Assessment Questions 1. DIARRHEA SEVERITY: "How bad is the diarrhea?" "How many more stools have you had in the past 24 hours than normal?"    - NO DIARRHEA (SCALE 0)   - MILD (SCALE 1-3): Few loose or mushy BMs; increase of 1-3 stools over normal daily number of stools; mild increase in ostomy output.   -  MODERATE (SCALE 4-7): Increase of 4-6 stools daily over normal; moderate increase in ostomy output.   -  SEVERE (SCALE 8-10; OR "WORST POSSIBLE"): Increase  of 7 or more stools daily over normal; moderate increase in ostomy output; incontinence.     severe 2. ONSET: "When did the diarrhea begin?"      Friday night 3. BM CONSISTENCY: "How loose or watery is the diarrhea?"      All liquid today 4. VOMITING: "Are you also vomiting?" If Yes, ask: "How many times in the past 24 hours?"      Have not had an episode since Saturday 5. ABDOMEN PAIN: "Are you having any abdomen pain?" If Yes, ask: "What does it feel like?" (e.g., crampy, dull, intermittent, constant)      Just a little bit of cramping 6. ABDOMEN PAIN SEVERITY: If present, ask: "How bad is the pain?"  (e.g., Scale 1-10; mild, moderate, or severe)   - MILD (1-3): doesn't interfere with normal activities, abdomen soft and not tender to touch    - MODERATE (4-7): interferes with normal activities or awakens from sleep, abdomen tender to touch    - SEVERE (8-10): excruciating pain, doubled over, unable to do any normal activities       mild 7. ORAL INTAKE: If vomiting, "Have you been able to drink liquids?" "How much liquids have you had in the past 24 hours?"     "Trying to stay hydrated"  8. HYDRATION: "Any signs of dehydration?" (e.g., dry mouth [not just dry lips], too weak to stand, dizziness, new weight loss) "When did you last urinate?"     Weakness "feeling light" 9. EXPOSURE: "Have you traveled to a foreign country recently?" "Have you been exposed to anyone with diarrhea?" "  Could you have eaten any food that was spoiled?"     no 10. ANTIBIOTIC USE: "Are you taking antibiotics now or have you taken antibiotics in the past 2 months?"       no 11. OTHER SYMPTOMS: "Do you have any other symptoms?" (e.g., fever, blood in stool)       no 12. PREGNANCY: "Is there any chance you are pregnant?" "When was your last menstrual period?"       no  Protocols used: [facility]

## 2024-01-15 NOTE — Patient Instructions (Signed)
-  Was a pleasure seeing you today -I suspect that you have a viral gastroenteritis -Continue to use Pedialyte and eat small, frequent easily digestible meals.  Consider broths, vegetables or fruit -If your symptoms worsen or you start feeling lightheaded or develop fevers or chills consider going to the emergency room for further evaluation -Please call with any questions or concerns

## 2024-01-15 NOTE — Assessment & Plan Note (Addendum)
-  Patient presented with diarrhea over the last 2 days and nausea and vomiting for 1 day which is since resolved -She has no fevers or chills.  No lightheadedness.  - Able to tolerate Pedialyte as well as some solid food -Still with some persistent diarrhea and queasiness and decreased appetite -I suspect this is likely viral gastroenteritis -Currently, patient is maintaining her blood pressure and is not tachycardic and has no symptomatic orthostasis -Will continue with conservative management for now.  Encouraged her to continue with Pedialyte as well as eating small amounts of food more frequently rather than large meals. -Encouraged her to try more fruits and vegetables and broths to see if this is better tolerated -If patient continues to have persistent diarrhea and is unable to have adequate oral intake I advised her to go to the emergency room for further evaluation as she may need IV hydration -Patient will call us if her symptoms worsen -No further workup at this time

## 2024-01-15 NOTE — Progress Notes (Signed)
Acute Office Visit  Subjective:     Patient ID: Susan [name], female    DOB: Jan 13, 1951, 73 y.o.   MRN: [id number]  Chief Complaint  Patient presents with   Acute Visit    Vomiting & Diarrhea started late on 01/11/23  A little queesy & abdominal cramping     HPI Patient is in today for diarrhea.  Patient states that Friday overnight she developed nausea and vomiting which lasted through Saturday.  Vomitus initially contained food particles and then was clear.  It was nonbloody/nonbilious.  She also had associated diarrhea.  She denied any melena or bright red blood per rectum.  States diarrhea was yellow and watery.  She is able to tolerate some Pedialyte and also small amounts of food.  She had bread and cheese this morning.  She was able to drink some chicken noodle soup yesterday.  Diarrhea has been persistent.  Since this morning has had 5-6 episodes.  She does complain of some mild abdominal cramping and feeling queasy.  No recurrent nausea/vomiting.  No fevers or chills.  Review of Systems  Constitutional:  Positive for malaise/fatigue. Negative for chills and fever.  Respiratory: Negative.    Cardiovascular: Negative.   Gastrointestinal:  Positive for abdominal pain (Complains of some mild abdominal cramping), diarrhea and vomiting.  Psychiatric/Behavioral: Negative.          Objective:    BP 122/76   Pulse 76   Temp (!) 97.3 F (36.3 C)   Ht 5' 7.5" (1.715 m)   Wt 130 lb (59 kg)   SpO2 96%   BMI 20.06 kg/m    Physical Exam Constitutional:      Appearance: Normal appearance.  HENT:     Head: Normocephalic and atraumatic.  Cardiovascular:     Rate and Rhythm: Normal rate and regular rhythm.  Pulmonary:     Effort: Pulmonary effort is normal.     Breath sounds: Normal breath sounds.  Abdominal:     General: Bowel sounds are normal. There is no distension.     Palpations: Abdomen is soft.     Tenderness: There is no abdominal tenderness. There is no  guarding or rebound.  Neurological:     Mental Status: She is alert.  Psychiatric:        Mood and Affect: Mood normal.        Behavior: Behavior normal.     No results found for any visits on 01/15/24.      Assessment & Plan:   Problem List Items Addressed This Visit       Digestive   Gastroenteritis - Primary   -Patient presented with diarrhea over the last 2 days and nausea and vomiting for 1 day which is since resolved -She has no fevers or chills.  No lightheadedness.  - Able to tolerate Pedialyte as well as some solid food -Still with some persistent diarrhea and queasiness and decreased appetite -I suspect this is likely viral gastroenteritis -Currently, patient is maintaining her blood pressure and is not tachycardic and has no symptomatic orthostasis -Will continue with conservative management for now.  Encouraged her to continue with Pedialyte as well as eating small amounts of food more frequently rather than large meals. -Encouraged her to try more fruits and vegetables and broths to see if this is better tolerated -If patient continues to have persistent diarrhea and is unable to have adequate oral intake I advised her to go to the emergency room for further evaluation as  she may need IV hydration -Patient will call us if her symptoms worsen -No further workup at this time       No orders of the defined types were placed in this encounter.   No follow-ups on file.  Earl Lagos, MD

## 2024-01-19 ENCOUNTER — Encounter: Payer: Self-pay | Admitting: Family

## 2024-01-22 NOTE — Telephone Encounter (Signed)
Scheduled video visit for  01/30/24 to discuss nedication

## 2024-01-30 ENCOUNTER — Encounter: Payer: Self-pay | Admitting: Family

## 2024-01-30 ENCOUNTER — Telehealth (INDEPENDENT_AMBULATORY_CARE_PROVIDER_SITE_OTHER): Payer: PPO | Admitting: Family

## 2024-01-30 VITALS — Ht 67.5 in | Wt 129.0 lb

## 2024-01-30 DIAGNOSIS — F39 Unspecified mood [affective] disorder: Secondary | ICD-10-CM

## 2024-01-30 DIAGNOSIS — G250 Essential tremor: Secondary | ICD-10-CM

## 2024-01-30 MED ORDER — TOPIRAMATE 25 MG PO TABS: 25.0000 mg | ORAL_TABLET | Freq: Every day | ORAL | 1 refills | Status: DC

## 2024-01-30 NOTE — Progress Notes (Signed)
 Virtual Visit via Video Note  I connected with Susan [name] on 01/30/24 at 11:30 AM EST by a video enabled telemedicine application and verified that I am speaking with the correct person using two identifiers. Location patient: home Location provider: work  Persons participating in the virtual visit: patient, provider  I discussed the limitations of evaluation and management by telemedicine and the availability of in person appointments. The patient expressed understanding and agreed to proceed.  HPI: Medication follow-up to discuss Topamax   She is concerned with weight loss and possible side effect of topmax.    Recent suspected viral gastroenteritis, symptoms completely resolved.  Appetite has returned to normal.   Goal weight is 133-145lbs  When she is 'stressed', she will have more head tremor. She has fine tremor in bilateral hands. She notices tremor when writing.  Drinks 1.5 cup of coffee daily.    Topamax  25 mg BID started for essential tremor. Celexa  has been helpful.  She had been on propranolol  in the past . She is currently on toprol  25mg .   Follow up with Dr melanee 01/08/24, completed 5 years of endocrine.  And    ROS: See pertinent positives and negatives per HPI.  EXAM:  VITALS per patient if applicable: Ht 5' 7.5 (1.715 m)   Wt 129 lb (58.5 kg)   BMI 19.91 kg/m  BP Readings from Last 3 Encounters:  01/15/24 122/76  01/08/24 (!) 145/75  09/13/23 (!) 151/80   Wt Readings from Last 3 Encounters:  01/30/24 129 lb (58.5 kg)  01/15/24 130 lb (59 kg)  01/08/24 135 lb (61.2 kg)    GENERAL: alert, oriented, appears well and in no acute distress  HEENT: atraumatic, conjunttiva clear, no obvious abnormalities on inspection of external nose and ears  NECK: normal movements of the head and neck  LUNGS: on inspection no signs of respiratory distress, breathing rate appears normal, no obvious gross SOB, gasping or wheezing  CV: no obvious cyanosis  MS:  moves all visible extremities without noticeable abnormality  PSYCH/NEURO: pleasant and cooperative, no obvious depression or anxiety, speech and thought processing grossly intact  ASSESSMENT AND PLAN: Mood disorder (HCC) Assessment & Plan: Chronic, stable.  Continue Celexa  20 mg   Essential tremor Assessment & Plan: Assessed over video visit.  No tremor observed on video.  Discussed Topamax  which can decrease appetite.  Patient and I jointly agreed reasonable decrease Topamax  to 25 mg once daily versus twice daily.  She will monitor appetite, tremor.  Discussed alternatives for as needed tremor and triggers including Xanax , primidone.  She will let me know how she is doing  Orders: -     Topiramate ; Take 1 tablet (25 mg total) by mouth daily.  Dispense: 30 tablet; Refill: 1     -we discussed possible serious and likely etiologies, options for evaluation and workup, limitations of telemedicine visit vs in person visit, treatment, treatment risks and precautions. Pt prefers to treat via telemedicine empirically rather then risking or undertaking an in person visit at this moment.    I discussed the assessment and treatment plan with the patient. The patient was provided an opportunity to ask questions and all were answered. The patient agreed with the plan and demonstrated an understanding of the instructions.   The patient was advised to call back or seek an in-person evaluation if the symptoms worsen or if the condition fails to improve as anticipated.  Advised if desired AVS can be mailed or viewed via MyChart if Mychart  user.   Rollene Northern, FNP

## 2024-01-30 NOTE — Assessment & Plan Note (Signed)
 Assessed over video visit.  No tremor observed on video.  Discussed Topamax  which can decrease appetite.  Patient and I jointly agreed reasonable decrease Topamax  to 25 mg once daily versus twice daily.  She will monitor appetite, tremor.  Discussed alternatives for as needed tremor and triggers including Xanax , primidone.  She will let me know how she is doing

## 2024-01-30 NOTE — Assessment & Plan Note (Signed)
Chronic, stable.  Continue Celexa 20 mg

## 2024-01-30 NOTE — Patient Instructions (Addendum)
 Decrease topamax  to 25mg  ONCE daily and monitor for appetite improvement, tremor.  If you would like completely wean off Topamax , you may then take 25 mg every other day for 1 week and then stop medicine.    May consider medication such as Xanax  or primidone to be used as needed for tremor if needed.    Please keep symptom diary.   Nice to see you!

## 2024-01-31 ENCOUNTER — Encounter: Payer: Self-pay | Admitting: Obstetrics & Gynecology

## 2024-01-31 ENCOUNTER — Ambulatory Visit: Payer: PPO | Admitting: Obstetrics & Gynecology

## 2024-01-31 VITALS — BP 120/79 | HR 69 | Wt 132.4 lb

## 2024-01-31 DIAGNOSIS — N83201 Unspecified ovarian cyst, right side: Secondary | ICD-10-CM

## 2024-01-31 DIAGNOSIS — R9389 Abnormal findings on diagnostic imaging of other specified body structures: Secondary | ICD-10-CM | POA: Diagnosis not present

## 2024-01-31 NOTE — Progress Notes (Signed)
 GYNECOLOGY OFFICE VISIT NOTE  History:   Susan [name] is a 73 y.o. G0P0000 here today for follow up after recent ultrasound results, has been followed for abnormal endometrium and small complex ovarian cyst with negative evaluation thus far. She denies any current abnormal vaginal discharge, bleeding, pelvic pain or other concerns.    Past Medical History:  Diagnosis Date   Actinic keratosis    Arthritis    Basal cell carcinoma 05/26/2016   R chest parasternal   Basal cell carcinoma 01/21/2015   L nose supratip    Breast cancer (HCC)    Left DCIS   Cancer (HCC)    Complication of anesthesia    Dysrhythmia    PSVT on metoprolol    Family history of breast cancer    Gastric reflux 90's   GERD (gastroesophageal reflux disease)    Headache    H/O MIGRAINES   Hiatal hernia 2003   History of Coronary CT for Calcium Scoring    a. 11/2018 CT Cor Ca2+ = Zero.   Hypertension    Mitral regurgitation    a. 03/2017 Echo: EF 55-60%, no nrwma, Mod MR; b. 03/2018 Echo: EF 60-65%, no rwma. Gr2 DD. Mild MV prolapse of ant leaflet w/ mild to mod mR. Nl RV fxn. Mild to mod TR.   Personal history of radiation therapy 2019   LEFT lumpectomy   PONV (postoperative nausea and vomiting)    N/V   PSVT (paroxysmal supraventricular tachycardia) (HCC)    a. 03/2017 Event Monitor: Avg HR 66, 10 episodes of SVT up to 42.8 secs, fastest 148 bpm. 8 beats WCT (179 bpm) - VT vs SVT w/ aberrancy. Rare PACs (2.6% total beats).   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   R prox lat bicep    Past Surgical History:  Procedure Laterality Date   AUGMENTATION MAMMAPLASTY Bilateral 01/17/2020   BREAST BIOPSY Left 06/13/2018   Affirm Bx- coil clip   atypical sclerosing and DCIS   BREAST ENHANCEMENT SURGERY Bilateral 01/17/2020   Procedure: MAMMOPLASTY AUGMENTATION WITH PROSTHESIS (BREAST);  Surgeon: Elisabeth Craig RAMAN, MD;  Location: [facility];  Service: Plastics;  Laterality: Bilateral;  2.5  hours for total case   BREAST EXCISIONAL BIOPSY Right 2000   benign   BREAST LUMPECTOMY WITH NEEDLE LOCALIZATION Left 06/26/2018   Procedure: BREAST LUMPECTOMY WITH NEEDLE LOCALIZATION;  Surgeon: Jordis Laneta FALCON, MD;  Location: [facility];  Service: General;  Laterality: Left;   CHOLECYSTECTOMY     OVARIAN CYST SURGERY     SCAR REVISION Left 01/17/2020   Procedure: POSSIBLE SCAR REVISION TO LEFT BREAST;  Surgeon: Elisabeth Craig RAMAN, MD;  Location: [facility];  Service: Plastics;  Laterality: Left;   SKIN FULL THICKNESS GRAFT  01/17/2020   Procedure: SKIN GRAFT FULL THICKNESS;  Surgeon: Elisabeth Craig RAMAN, MD;  Location: [facility];  Service: Plastics;;   TONSILLECTOMY     age 24   UPPER GI ENDOSCOPY  2003    The following portions of the patient's history were reviewed and updated as appropriate: allergies, current medications, past family history, past medical history, past social history, past surgical history and problem list.   Review of Systems:  Pertinent items noted in HPI and remainder of comprehensive ROS otherwise negative.  Physical Exam:  BP 120/79   Pulse 69   Wt 132 lb 6.4 oz (60.1 kg)   BMI 20.43 kg/m  CONSTITUTIONAL: Well-developed, well-nourished female in no acute distress.  HEENT:  Normocephalic, atraumatic. External right and left ear normal. No scleral icterus.  NECK: Normal range of motion, supple, no masses noted on observation SKIN: No rash noted. Not diaphoretic. No erythema. No pallor. MUSCULOSKELETAL: Normal range of motion. No edema noted. NEUROLOGIC: Alert and oriented to person, place, and time. Normal muscle tone coordination. No cranial nerve deficit noted. PSYCHIATRIC: Normal mood and affect. Normal behavior. Normal judgment and thought content. CARDIOVASCULAR: Normal heart rate noted RESPIRATORY: Effort and breath sounds normal, no problems with respiration noted ABDOMEN: No masses noted. No other overt distention noted.    PELVIC: Deferred  Labs and Imaging US  PELVIC COMPLETE WITH TRANSVAGINAL Result Date: 01/09/2024 : PROCEDURE: US  PELVIS COMPLETE WITH TRANSVAGINAL HISTORY: Patient is a 73 y/o F with Follow up thickened endometrial lining as seen on 08/02/2023 ultrasound and right ovarian cysts. COMPARISON: U/S pelvis 08/02/2023, MR Pelvis 06/12/2023. TECHNIQUE: Two-dimensional transabdominal grayscale and color Doppler ultrasound of the pelvis was performed. Transvaginal was performed. FINDINGS: The uterus is anteverted in position and measures 6.9 x 2.8 x 3.9 cm. It demonstrates a normal, homogeneous echotexture with a 1.0 cm anterior lower uterine segment intramural fibroid. The endometrium measures 0.9 cm and demonstrates a heterogeneous echotexture with multiple small cystic spaces. The right ovary measures 3.9 x 2.8 x 2.9 cm and demonstrates a normal echotexture with a 2.6 x 2.5 x 2.5 cm cystic lesion with multiple septations versus multiple adjacent follicles. There is normal color Doppler flow. The left ovary measures 2.7 x 1.9 x 2.0 cm and demonstrates a normal echotexture with a 1.6 cm dominant anechoic follicle. There is normal color Doppler flow. There is no fluid present within the cul-de-sac. IMPRESSION: 1.  Anterior lower uterine 1.0 cm intramural fibroid. 2. Thickened and heterogeneous postmenopausal endometrium multiple small cystic spaces. Gynecological consultation and hysteroscopy is recommended for further evaluation. 3. Right ovarian 2.6 cm cystic lesion with multiple septations versus multiple adjacent follicles. This does not appear to represent the same cystic lesions visualized on the prior study. Multiphase pelvic MRI could be obtained, as clinically indicated. Electronically Signed   By: Lynwood Mains M.D.   On: 01/09/2024 07:23      Assessment and Plan:     1. Thickened endometrium Reviewed ultrasound findings.This is stable compared to previous studies. She is reassured by benign endometrial  biopsy done on 07/04/2023. Talked about being able to do targeted biopsy of endometrial lesion, but she is asymptomatic and feels the endometrial thickening was a result of Tamoxifen  use, which she has stopped for several months.  She desires repeat ultrasound in  July 2025.  This was ordered.  PMB precautions emphasized.  - US  PELVIC COMPLETE WITH TRANSVAGINAL; Future  2. Cyst of right ovary Stable findings, negative ROMA, this is reassuring. Will follow up on repeat scan.  - US  PELVIC COMPLETE WITH TRANSVAGINAL; Future  Routine preventative health maintenance measures emphasized. Please refer to After Visit Summary for other counseling recommendations.   Return for follow up as recommended.    I spent 25 minutes dedicated to the care of this patient including pre-visit review of records, face to face time with the patient discussing her conditions and treatments and post visit orders.    GLORIS HUGGER, MD, FACOG Obstetrician & Gynecologist, [facility] for [employer], [facility]

## 2024-02-23 ENCOUNTER — Other Ambulatory Visit: Payer: Self-pay | Admitting: Family

## 2024-02-23 DIAGNOSIS — G250 Essential tremor: Secondary | ICD-10-CM

## 2024-03-04 ENCOUNTER — Encounter: Payer: Self-pay | Admitting: Family

## 2024-03-04 ENCOUNTER — Ambulatory Visit (INDEPENDENT_AMBULATORY_CARE_PROVIDER_SITE_OTHER): Payer: PPO | Admitting: Family

## 2024-03-04 VITALS — BP 120/74 | HR 59 | Temp 97.8°F | Ht 67.5 in | Wt 134.8 lb

## 2024-03-04 DIAGNOSIS — G4700 Insomnia, unspecified: Secondary | ICD-10-CM | POA: Diagnosis not present

## 2024-03-04 DIAGNOSIS — I4719 Other supraventricular tachycardia: Secondary | ICD-10-CM | POA: Diagnosis not present

## 2024-03-04 DIAGNOSIS — G250 Essential tremor: Secondary | ICD-10-CM

## 2024-03-04 DIAGNOSIS — I700 Atherosclerosis of aorta: Secondary | ICD-10-CM | POA: Diagnosis not present

## 2024-03-04 DIAGNOSIS — Z780 Asymptomatic menopausal state: Secondary | ICD-10-CM | POA: Diagnosis not present

## 2024-03-04 MED ORDER — ALPRAZOLAM 0.5 MG PO TABS
0.5000 mg | ORAL_TABLET | Freq: Every evening | ORAL | 0 refills | Status: DC | PRN
Start: 2024-03-04 — End: 2024-03-04

## 2024-03-04 MED ORDER — ALPRAZOLAM 0.5 MG PO TABS: 0.2500 mg | ORAL_TABLET | Freq: Every day | ORAL | 0 refills | Status: AC | PRN

## 2024-03-04 MED ORDER — TOPIRAMATE 25 MG PO TABS
25.0000 mg | ORAL_TABLET | Freq: Every day | ORAL | Status: DC
Start: 2024-03-04 — End: 2024-03-27

## 2024-03-04 NOTE — Progress Notes (Signed)
 Assessment & Plan:  Atrial tachycardia (HCC) -     CBC with Differential/Platelet; Future -     Comprehensive metabolic panel; Future -     TSH; Future  Essential tremor Assessment & Plan: Chronic, stable.  Continue Topamax 25 mg nightly.  Provided refill of Xanax 0.5 mg to be used as needed for breakthrough tremor, if anxiety aggravates. Advised to store in safe place. Advised patient to monitor for excessive sedation.   Orders: -     Topiramate; Take 1 tablet (25 mg total) by mouth daily. -     ALPRAZolam; Take 0.5-1 tablets (0.25-0.5 mg total) by mouth daily as needed. for sleep  Dispense: 30 tablet; Refill: 0  Insomnia, unspecified type -     ALPRAZolam; Take 0.5-1 tablets (0.25-0.5 mg total) by mouth daily as needed. for sleep  Dispense: 30 tablet; Refill: 0  Atherosclerosis of aorta (HCC) -     Lipid panel; Future  Asymptomatic postmenopausal state -     VITAMIN D 25 Hydroxy (Vit-D Deficiency, Fractures); Future     Return precautions given.   Risks, benefits, and alternatives of the medications and treatment plan prescribed today were discussed, and patient expressed understanding.   Education regarding symptom management and diagnosis given to patient on AVS either electronically or printed.  Return in about 1 year (around 03/04/2025) for Fasting labs in 2-3 weeks.  Rennie Plowman, FNP  Subjective:    Patient ID: Sherie Dobrowolski, female    DOB: 1951-02-11, 73 y.o.   MRN: [id number]  CC: Janijah Symons is a 73 y.o. female who presents today for follow up.   HPI: Feels well today.  No new complaints.  Decrease Topamax 25 mg once daily and appetite increased.   Tremors are not bothersome.   She was provided the next years again to help with sleep, anxiety. She has not used in many months.        continues to follow with GYN, plan to repeat pelvic ultrasound July 2025 to follow PMB.   Allergies: Patient has no known allergies. Current Outpatient  Medications on File Prior to Visit  Medication Sig Dispense Refill   calcium citrate-vitamin D 500-500 MG-UNIT chewable tablet Chew 1 tablet by mouth 2 (two) times daily.     cholecalciferol (VITAMIN D) 1000 units tablet Take 1,000 Units by mouth daily.     citalopram (CELEXA) 20 MG tablet Take 1 tablet (20 mg total) by mouth daily. 90 tablet 3   clobetasol ointment (TEMOVATE) 0.05 % APPLY TO AFFECTED AREA EVERY NIGHT FOR 4 WEEKS, THEN EVERY OTHER DAY FOR 4 WEEKS AND THEN TWICE A WEEK FOR 4 WEEKS OR UNTIL RESOLUTION. 30 g 5   cyclobenzaprine (FLEXERIL) 5 MG tablet Take 1-2 tablets (5-10 mg total) by mouth at bedtime as needed for muscle spasms. 30 tablet 1   famotidine (PEPCID) 20 MG tablet Take 20 mg by mouth as needed for heartburn or indigestion.     fluticasone (FLONASE) 50 MCG/ACT nasal spray Place 2 sprays into both nostrils as needed.      Ivermectin 1 % CREA Apply 1 Application topically at bedtime. qhs to face for Rosacea 30 g 11   meloxicam (MOBIC) 15 MG tablet Take 1 tablet (15 mg total) by mouth at bedtime. Use sparingly. Take with food. 90 tablet 1   metoprolol succinate (TOPROL-XL) 25 MG 24 hr tablet TAKE 1/2 TABLET BY MOUTH DAILY 45 tablet 3   Multiple Vitamin (MULTIVITAMIN) tablet Take 1 tablet by mouth  daily.     psyllium (REGULOID) 0.52 g capsule Take 0.52 g by mouth 3 (three) times daily.      SIMETHICONE-80 PO Take 80 mg by mouth as needed (gas).      No current facility-administered medications on file prior to visit.    Review of Systems  Constitutional:  Negative for chills and fever.  Respiratory:  Negative for cough.   Cardiovascular:  Negative for chest pain and palpitations.  Gastrointestinal:  Negative for nausea and vomiting.  Neurological:  Positive for tremors.      Objective:    BP 120/74   Pulse (!) 59   Temp 97.8 F (36.6 C) (Oral)   Ht 5' 7.5" (1.715 m)   Wt 134 lb 12.8 oz (61.1 kg)   SpO2 98%   BMI 20.80 kg/m  BP Readings from Last 3  Encounters:  03/04/24 120/74  01/31/24 120/79  01/15/24 122/76   Wt Readings from Last 3 Encounters:  03/04/24 134 lb 12.8 oz (61.1 kg)  01/31/24 132 lb 6.4 oz (60.1 kg)  01/30/24 129 lb (58.5 kg)    Physical Exam Vitals reviewed.  Constitutional:      Appearance: She is well-developed.  Eyes:     Conjunctiva/sclera: Conjunctivae normal.  Cardiovascular:     Rate and Rhythm: Normal rate and regular rhythm.     Pulses: Normal pulses.     Heart sounds: Normal heart sounds.  Pulmonary:     Effort: Pulmonary effort is normal.     Breath sounds: Normal breath sounds. No wheezing, rhonchi or rales.  Skin:    General: Skin is warm and dry.  Neurological:     Mental Status: She is alert.     Comments: No tremor observed on exam.  No pill rolling, shuffling gait  Psychiatric:        Speech: Speech normal.        Behavior: Behavior normal.        Thought Content: Thought content normal.

## 2024-03-04 NOTE — Patient Instructions (Signed)
 Provided you with with Xanax to use for for breakthrough anxiety, tremor.    Please continue Topamax 25 mg at bedtime.   Nice to see you!

## 2024-03-04 NOTE — Assessment & Plan Note (Signed)
 Chronic, stable.  Continue Topamax 25 mg nightly.  Provided refill of Xanax 0.5 mg to be used as needed for breakthrough tremor, if anxiety aggravates. Advised to store in safe place. Advised patient to monitor for excessive sedation.

## 2024-03-12 ENCOUNTER — Other Ambulatory Visit: Payer: Self-pay | Admitting: Oncology

## 2024-03-12 DIAGNOSIS — Z1231 Encounter for screening mammogram for malignant neoplasm of breast: Secondary | ICD-10-CM

## 2024-03-13 ENCOUNTER — Ambulatory Visit (INDEPENDENT_AMBULATORY_CARE_PROVIDER_SITE_OTHER): Payer: PPO

## 2024-03-13 VITALS — BP 122/75 | Ht 67.5 in | Wt 134.2 lb

## 2024-03-13 DIAGNOSIS — Z0000 Encounter for general adult medical examination without abnormal findings: Secondary | ICD-10-CM | POA: Diagnosis not present

## 2024-03-13 NOTE — Progress Notes (Signed)
 Subjective:   Susan [name] is a 73 y.o. who presents for a Medicare Wellness preventive visit.  Visit Complete: Virtual I connected with  Leonides Cave on 03/13/24 by a video and audio enabled telemedicine application and verified that I am speaking with the correct person using two identifiers.  Patient Location: Home  Provider Location: Office/Clinic  I discussed the limitations of evaluation and management by telemedicine. The patient expressed understanding and agreed to proceed.  Vital Signs: Because this visit was a virtual/telehealth visit, some criteria may be missing or patient reported. Any vitals not documented were not able to be obtained and vitals that have been documented are patient reported.   Persons Participating in Visit: Patient.  AWV Questionnaire: Yes: Patient Medicare AWV questionnaire was completed by the patient on 03/12/24; I have confirmed that all information answered by patient is correct and no changes since this date.  Cardiac Risk Factors include: advanced age (>28men, >6 women);hypertension;dyslipidemia     Objective:    Today's Vitals   03/13/24 1016  BP: 122/75  Weight: 134 lb 4 oz (60.9 kg)  Height: 5' 7.5" (1.715 m)   Body mass index is 20.72 kg/m.     03/13/2024   10:27 AM 01/08/2024   10:53 AM 07/06/2023    9:32 AM 01/30/2023    2:28 PM 12/30/2022    2:21 PM 06/24/2022   11:23 AM 02/04/2022    9:31 AM  Advanced Directives  Does Patient Have a Medical Advance Directive? Yes No Yes Yes No Yes Yes  Type of Estate agent of [place];Living will  Healthcare Power of [place];Living will Healthcare Power of [place];Living will  Healthcare Power of [employer] of [place];Living will  Does patient want to make changes to medical advance directive?   Yes (ED - Information included in AVS) No - Patient declined   No - Patient declined  Copy of Healthcare Power of Attorney in Chart? No - copy requested    Yes - validated most recent copy scanned in chart (See row information)   No - copy requested  Would patient like information on creating a medical advance directive?  No - Patient declined         Current Medications (verified) Outpatient Encounter Medications as of 03/13/2024  Medication Sig   ALPRAZolam (XANAX) 0.5 MG tablet Take 0.5-1 tablets (0.25-0.5 mg total) by mouth daily as needed. for sleep   calcium citrate-vitamin D 500-500 MG-UNIT chewable tablet Chew 1 tablet by mouth 2 (two) times daily.   cholecalciferol (VITAMIN D) 1000 units tablet Take 1,000 Units by mouth daily.   citalopram (CELEXA) 20 MG tablet Take 1 tablet (20 mg total) by mouth daily.   clobetasol ointment (TEMOVATE) 0.05 % APPLY TO AFFECTED AREA EVERY NIGHT FOR 4 WEEKS, THEN EVERY OTHER DAY FOR 4 WEEKS AND THEN TWICE A WEEK FOR 4 WEEKS OR UNTIL RESOLUTION.   cyclobenzaprine (FLEXERIL) 5 MG tablet Take 1-2 tablets (5-10 mg total) by mouth at bedtime as needed for muscle spasms.   famotidine (PEPCID) 20 MG tablet Take 20 mg by mouth as needed for heartburn or indigestion.   fluticasone (FLONASE) 50 MCG/ACT nasal spray Place 2 sprays into both nostrils as needed.    Ivermectin 1 % CREA Apply 1 Application topically at bedtime. qhs to face for Rosacea   meloxicam (MOBIC) 15 MG tablet Take 1 tablet (15 mg total) by mouth at bedtime. Use sparingly. Take with food.   metoprolol succinate (TOPROL-XL) 25 MG  24 hr tablet TAKE 1/2 TABLET BY MOUTH DAILY   Multiple Vitamin (MULTIVITAMIN) tablet Take 1 tablet by mouth daily.   psyllium (REGULOID) 0.52 g capsule Take 0.52 g by mouth 3 (three) times daily.    SIMETHICONE-80 PO Take 80 mg by mouth as needed (gas).    topiramate (TOPAMAX) 25 MG tablet Take 1 tablet (25 mg total) by mouth daily.   No facility-administered encounter medications on file as of 03/13/2024.    Allergies (verified) Patient has no known allergies.   History: Past Medical History:  Diagnosis Date    Actinic keratosis    Arthritis    Basal cell carcinoma 05/26/2016   R chest parasternal   Basal cell carcinoma 01/21/2015   L nose supratip    Breast cancer (HCC)    Left DCIS   Cancer (HCC)    Complication of anesthesia    Dysrhythmia    PSVT on metoprolol   Family history of breast cancer    Gastric reflux 90's   GERD (gastroesophageal reflux disease)    Headache    H/O MIGRAINES   Hiatal hernia 2003   History of Coronary CT for Calcium Scoring    a. 11/2018 CT Cor Ca2+ = Zero.   Hypertension    Mitral regurgitation    a. 03/2017 Echo: EF 55-60%, no nrwma, Mod MR; b. 03/2018 Echo: EF 60-65%, no rwma. Gr2 DD. Mild MV prolapse of ant leaflet w/ mild to mod mR. Nl RV fxn. Mild to mod TR.   Personal history of radiation therapy 2019   LEFT lumpectomy   PONV (postoperative nausea and vomiting)    N/V   PSVT (paroxysmal supraventricular tachycardia) (HCC)    a. 03/2017 Event Monitor: Avg HR 66, 10 episodes of SVT up to 42.8 secs, fastest 148 bpm. 8 beats WCT (179 bpm) - VT vs SVT w/ aberrancy. Rare PACs (2.6% total beats).   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   R prox lat bicep   Past Surgical History:  Procedure Laterality Date   AUGMENTATION MAMMAPLASTY Bilateral 01/17/2020   BREAST BIOPSY Left 06/13/2018   Affirm Bx- coil clip   atypical sclerosing and DCIS   BREAST ENHANCEMENT SURGERY Bilateral 01/17/2020   Procedure: MAMMOPLASTY AUGMENTATION WITH PROSTHESIS (BREAST);  Surgeon: Allena Napoleon, MD;  Location: [facility];  Service: Plastics;  Laterality: Bilateral;  2.5 hours for total case   BREAST EXCISIONAL BIOPSY Right 2000   benign   BREAST LUMPECTOMY WITH NEEDLE LOCALIZATION Left 06/26/2018   Procedure: BREAST LUMPECTOMY WITH NEEDLE LOCALIZATION;  Surgeon: Leafy Ro, MD;  Location: [facility];  Service: General;  Laterality: Left;   CHOLECYSTECTOMY     OVARIAN CYST SURGERY     SCAR REVISION Left 01/17/2020   Procedure: POSSIBLE SCAR REVISION  TO LEFT BREAST;  Surgeon: Allena Napoleon, MD;  Location: [facility];  Service: Plastics;  Laterality: Left;   SKIN FULL THICKNESS GRAFT  01/17/2020   Procedure: SKIN GRAFT FULL THICKNESS;  Surgeon: Allena Napoleon, MD;  Location: [facility];  Service: Plastics;;   TONSILLECTOMY     age 19   UPPER GI ENDOSCOPY  2003   Family History  Problem Relation Age of Onset   Hyperlipidemia Mother    Hypertension Mother    COPD Mother    Heart failure Mother        deceased 81   Stroke Father 35   Hypertension Father    Hypertension Sister  Hypertension Brother    Healthy Brother    Melanoma Paternal Uncle    Breast cancer Cousin 80       daughter of paternal aunt; currently 2   Breast cancer Cousin 35       daughter of paternal uncle; currently 19   Breast cancer Cousin 64       daughter of maternal uncle; deceased 12   Breast cancer Cousin 30       daughter of same maternal uncle; currently 86   Testicular cancer Other 52       sister's son; currently 65   Coronary artery disease Neg Hx    Social History   Socioeconomic History   Marital status: Married    Spouse name: Richard   Number of children: 2   Years of education: College   Highest education level: Bachelor's degree (e.g., BA, AB, BS)  Occupational History   Occupation: retired  Tobacco Use   Smoking status: Never   Smokeless tobacco: Never  Vaping Use   Vaping status: Never Used  Substance and Sexual Activity   Alcohol use: Yes    Alcohol/week: 2.0 standard drinks of alcohol    Types: 2 Glasses of wine per week    Comment: daily WINE   Drug use: No   Sexual activity: Not Currently    Birth control/protection: Post-menopausal  Other Topics Concern   Not on file  Social History Narrative   Married Engineer, 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      From: the area, [place]   Living: with husband Gerlene Burdock [phone])   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      Family: 2 step children - Claris Che and  Delta - 3 grandchildren, good relationships      Enjoys: garden, flowers      Exercise: walking, walking the dog IT sales professional doodle) - daily, average 6 mile/day   Diet: fairly good       Safety   Seat belts: Yes    Guns: Yes  and secure   Safe in relationships: Yes    Social Drivers of Corporate investment banker Strain: Low Risk  (03/13/2024)   Overall Financial Resource Strain (CARDIA)    Difficulty of Paying Living Expenses: Not hard at all  Food Insecurity: No Food Insecurity (03/13/2024)   Hunger Vital Sign    Worried About Running Out of Food in the Last Year: Never true    Ran Out of Food in the Last Year: Never true  Transportation Needs: No Transportation Needs (03/13/2024)   PRAPARE - Administrator, Civil Service (Medical): No    Lack of Transportation (Non-Medical): No  Physical Activity: Sufficiently Active (03/13/2024)   Exercise Vital Sign    Days of Exercise per Week: 6 days    Minutes of Exercise per Session: 40 min  Stress: No Stress Concern Present (03/13/2024)   [employer] of Occupational Health - Occupational Stress Questionnaire    Feeling of Stress : Not at all  Social Connections: Moderately Integrated (03/13/2024)   Social Connection and Isolation Panel [NHANES]    Frequency of Communication with Friends and Family: More than three times a week    Frequency of Social Gatherings with Friends and Family: More than three times a week    Attends Religious Services: Never    Database administrator or Organizations: Yes    Attends Engineer, structural: More than 4 times per year    Marital Status: Married  Tobacco Counseling Counseling given: Not Answered    Clinical Intake:  Pre-visit preparation completed: Yes  Pain : No/denies pain     BMI - recorded: 20.72 Nutritional Status: BMI of 19-24  Normal Nutritional Risks: None Diabetes: No  Lab Results  Component Value Date   HGBA1C 5.7 10/02/2013     How often do  you need to have someone help you when you read instructions, pamphlets, or other written materials from your doctor or pharmacy?: 1 - Never  Interpreter Needed?: No  Information entered by :: R. Bernestine Holsapple LPN   Activities of Daily Living     03/12/2024   11:21 AM  In your present state of health, do you have any difficulty performing the following activities:  Hearing? 0  Vision? 0  Difficulty concentrating or making decisions? 0  Walking or climbing stairs? 0  Dressing or bathing? 0  Doing errands, shopping? 0  Preparing Food and eating ? N  Using the Toilet? N  In the past six months, have you accidently leaked urine? Y  Comment a little when coughing or sneezing  Do you have problems with loss of bowel control? N  Managing your Medications? N  Managing your Finances? N  Housekeeping or managing your Housekeeping? N    Patient Care Team: Allegra Grana, FNP as PCP - General (Family Medicine) Antonieta Iba, MD as PCP - Cardiology (Cardiology) Madlyn Frankel, MD as Referring Physician (Dermatology) Creig Hines, MD as Consulting Physician (Oncology)  Indicate any recent Medical Services you may have received from other than [facility] providers in the past year (date may be approximate).     Assessment:   This is a routine wellness examination for Mykayla.  Hearing/Vision screen Hearing Screening - Comments:: No issues Vision Screening - Comments:: glasses   Goals Addressed             This Visit's Progress    Patient Stated       Wants to eat salmon       Depression Screen     03/13/2024   10:22 AM 03/04/2024    9:51 AM 01/30/2024   11:20 AM 01/30/2024   11:13 AM 01/15/2024    9:02 AM 09/04/2023   12:12 PM 08/14/2023    1:07 PM  PHQ 2/9 Scores  PHQ - 2 Score 0 0 0 0 0 0 0  PHQ- 9 Score 0 0 0 0 0 0 1    Fall Risk     03/12/2024   11:21 AM 03/04/2024    9:51 AM 01/30/2024   11:13 AM 01/15/2024    9:02 AM 09/04/2023   12:12 PM  Fall Risk   Falls in  the past year? 0 0 0 0 0  Number falls in past yr: 0 0 0 0 0  Injury with Fall? 0 0 0 0 0  Risk for fall due to : No Fall Risks No Fall Risks No Fall Risks No Fall Risks No Fall Risks  Follow up Falls prevention discussed;Falls evaluation completed Falls evaluation completed Falls evaluation completed Falls evaluation completed Falls evaluation completed    MEDICARE RISK AT HOME:  Medicare Risk at Home Any stairs in or around the home?: (Patient-Rptd) Yes If so, are there any without handrails?: (Patient-Rptd) No Home free of loose throw rugs in walkways, pet beds, electrical cords, etc?: (Patient-Rptd) Yes Adequate lighting in your home to reduce risk of falls?: (Patient-Rptd) Yes Life alert?: (Patient-Rptd) No Use of a cane,  walker or w/c?: (Patient-Rptd) No Grab bars in the bathroom?: (Patient-Rptd) Yes Shower chair or bench in shower?: (Patient-Rptd) Yes Elevated toilet seat or a handicapped toilet?: (Patient-Rptd) No  TIMED UP AND GO:  Was the test performed?  No  Cognitive Function: 6CIT completed    01/26/2021    2:55 PM 05/29/2017    2:15 PM  MMSE - Mini Mental State Exam  Orientation to time 5 5  Orientation to Place 5 5  Registration 3 3  Attention/ Calculation 5 0  Recall 3 3  Language- name 2 objects  0  Language- repeat 1 1  Language- follow 3 step command  3  Language- read & follow direction  0  Write a sentence  0  Copy design  0  Total score  20        03/13/2024   10:27 AM 01/30/2023    2:31 PM  6CIT Screen  What Year? 0 points 0 points  What month? 0 points 0 points  What time? 0 points 0 points  Count back from 20 0 points 0 points  Months in reverse 0 points 0 points  Repeat phrase 0 points 0 points  Total Score 0 points 0 points    Immunizations Immunization History  Administered Date(s) Administered   Fluad Quad(high Dose 65+) 09/05/2019, 09/22/2021, 09/19/2022   Influenza, High Dose Seasonal PF 09/18/2018, 09/29/2023   Influenza,inj,Quad  PF,6+ Mos 10/02/2013, 09/23/2015, 09/16/2016, 10/03/2017   Influenza-Unspecified 09/25/2020, 09/29/2023   PFIZER(Purple Top)SARS-COV-2 Vaccination 02/02/2020, 02/27/2020, 10/13/2020, 06/21/2021   Pneumococcal Conjugate-13 01/25/2016   Pneumococcal Polysaccharide-23 05/29/2017   Td 12/03/2008   Zoster Recombinant(Shingrix) 04/27/2020, 06/27/2020   Zoster, Live 09/29/2015    Screening Tests Health Maintenance  Topic Date Due   Hepatitis C Screening  Never done   COVID-19 Vaccine (5 - 2024-25 season) 08/27/2023   Medicare Annual Wellness (AWV)  01/31/2024   DTaP/Tdap/Td (2 - Tdap) 03/04/2025 (Originally 12/03/2018)   MAMMOGRAM  06/21/2025   Colonoscopy  06/16/2027   Pneumonia Vaccine 21+ Years old  Completed   INFLUENZA VACCINE  Completed   DEXA SCAN  Completed   Zoster Vaccines- Shingrix  Completed   HPV VACCINES  Aged Out   COLON CANCER SCREENING ANNUAL FOBT  Discontinued    Health Maintenance  Health Maintenance Due  Topic Date Due   Hepatitis C Screening  Never done   COVID-19 Vaccine (5 - 2024-25 season) 08/27/2023   Medicare Annual Wellness (AWV)  01/31/2024   Health Maintenance Items Addressed: Discussed Hepatitis C screening. Patient wants to check with her [employer] and make sure they will pay for it first because she was told previously they would not cover it. Covid vaccines discussed.  Additional Screening:  Vision Screening: Recommended annual ophthalmology exams for early detection of glaucoma and other disorders of the eye. Up to date [facility]  Dental Screening: Recommended annual dental exams for proper oral hygiene  Community Resource Referral / Chronic Care Management: CRR required this visit?  No   CCM required this visit?  No     Plan:     I have personally reviewed and noted the following in the patient's chart:   Medical and social history Use of alcohol, tobacco or illicit drugs  Current medications and supplements including  opioid prescriptions. Patient is not currently taking opioid prescriptions. Functional ability and status Nutritional status Physical activity Advanced directives List of other physicians Hospitalizations, surgeries, and ER visits in previous 12 months Vitals Screenings to include  cognitive, depression, and falls Referrals and appointments  In addition, I have reviewed and discussed with patient certain preventive protocols, quality metrics, and best practice recommendations. A written personalized care plan for preventive services as well as general preventive health recommendations were provided to patient.     Sydell Axon, LPN   06/24/5283   After Visit Summary: (MyChart) Due to this being a telephonic visit, the after visit summary with patients personalized plan was offered to patient via MyChart   Notes: Please refer to Routing Comments.

## 2024-03-13 NOTE — Patient Instructions (Signed)
 Ms. Yore , Thank you for taking time to come for your Medicare Wellness Visit. I appreciate your ongoing commitment to your health goals. Please review the following plan we discussed and let me know if I can assist you in the future.   Referrals/Orders/Follow-Ups/Clinician Recommendations: Remember to check with your insurance company regarding the Hepatitis C screening lab test.  This is a list of the screening recommended for you and due dates:  Health Maintenance  Topic Date Due   Hepatitis C Screening  Never done   COVID-19 Vaccine (5 - 2024-25 season) 08/27/2023   Medicare Annual Wellness Visit  03/13/2025   Mammogram  06/21/2025   Colon Cancer Screening  06/16/2027   DTaP/Tdap/Td vaccine (3 - Td or Tdap) 07/12/2033   Pneumonia Vaccine  Completed   Flu Shot  Completed   DEXA scan (bone density measurement)  Completed   Zoster (Shingles) Vaccine  Completed   HPV Vaccine  Aged Out   Stool Blood Test  Discontinued    Advanced directives: (Copy Requested) Please bring a copy of your health care power of attorney and living will to the office to be added to your chart at your convenience. You can mail to [facility] [address] [place], [place] [address] or email to [email or web address]  Next Medicare Annual Wellness Visit scheduled for next year: Yes 03/18/25 @ 10:10

## 2024-03-15 ENCOUNTER — Encounter: Payer: Self-pay | Admitting: Family

## 2024-03-15 ENCOUNTER — Other Ambulatory Visit: Payer: Self-pay | Admitting: Family

## 2024-03-15 DIAGNOSIS — Z1159 Encounter for screening for other viral diseases: Secondary | ICD-10-CM

## 2024-03-15 NOTE — Telephone Encounter (Signed)
 Spoke to pt and informed her that Hep lab was added to her labs for her appt on 3/27

## 2024-03-21 ENCOUNTER — Other Ambulatory Visit (INDEPENDENT_AMBULATORY_CARE_PROVIDER_SITE_OTHER)

## 2024-03-21 DIAGNOSIS — I4719 Other supraventricular tachycardia: Secondary | ICD-10-CM | POA: Diagnosis not present

## 2024-03-21 DIAGNOSIS — I700 Atherosclerosis of aorta: Secondary | ICD-10-CM | POA: Diagnosis not present

## 2024-03-21 DIAGNOSIS — Z780 Asymptomatic menopausal state: Secondary | ICD-10-CM

## 2024-03-21 DIAGNOSIS — Z1159 Encounter for screening for other viral diseases: Secondary | ICD-10-CM | POA: Diagnosis not present

## 2024-03-21 LAB — COMPREHENSIVE METABOLIC PANEL WITH GFR
ALT: 16 U/L (ref 0–35)
AST: 20 U/L (ref 0–37)
Albumin: 4.2 g/dL (ref 3.5–5.2)
Alkaline Phosphatase: 52 U/L (ref 39–117)
BUN: 13 mg/dL (ref 6–23)
CO2: 28 meq/L (ref 19–32)
Calcium: 9.4 mg/dL (ref 8.4–10.5)
Chloride: 102 meq/L (ref 96–112)
Creatinine, Ser: 0.76 mg/dL (ref 0.40–1.20)
GFR: 77.62 mL/min (ref >60.00)
Glucose, Bld: 95 mg/dL (ref 70–99)
Potassium: 4.3 meq/L (ref 3.5–5.1)
Sodium: 136 meq/L (ref 135–145)
Total Bilirubin: 1.2 mg/dL (ref 0.2–1.2)
Total Protein: 6.8 g/dL (ref 6.0–8.3)

## 2024-03-21 LAB — CBC WITH DIFFERENTIAL/PLATELET
Basophils Absolute: 0.1 10*3/uL (ref 0.0–0.1)
Basophils Relative: 1.8 % (ref 0.0–3.0)
Eosinophils Absolute: 0.1 10*3/uL (ref 0.0–0.7)
Eosinophils Relative: 2.6 % (ref 0.0–5.0)
HCT: 41.8 % (ref 36.0–46.0)
Hemoglobin: 13.9 g/dL (ref 12.0–15.0)
Lymphocytes Relative: 21.3 % (ref 12.0–46.0)
Lymphs Abs: 1 10*3/uL (ref 0.7–4.0)
MCHC: 33.3 g/dL (ref 30.0–36.0)
MCV: 91.5 fl (ref 78.0–100.0)
Monocytes Absolute: 0.6 10*3/uL (ref 0.1–1.0)
Monocytes Relative: 12.4 % — ABNORMAL HIGH (ref 3.0–12.0)
Neutro Abs: 2.9 10*3/uL (ref 1.4–7.7)
Neutrophils Relative %: 61.9 % (ref 43.0–77.0)
Platelets: 271 10*3/uL (ref 150.0–400.0)
RBC: 4.57 Mil/uL (ref 3.87–5.11)
RDW: 14 % (ref 11.5–15.5)
WBC: 4.6 10*3/uL (ref 4.0–10.5)

## 2024-03-21 LAB — LIPID PANEL
Cholesterol: 170 mg/dL (ref 0–200)
HDL: 70.1 mg/dL (ref >39.00)
LDL Cholesterol: 87 mg/dL (ref 0–99)
NonHDL: 100.02
Total CHOL/HDL Ratio: 2
Triglycerides: 64 mg/dL (ref 0.0–149.0)
VLDL: 12.8 mg/dL (ref 0.0–40.0)

## 2024-03-22 LAB — HEPATITIS C ANTIBODY: Hepatitis C Ab: NONREACTIVE

## 2024-03-22 LAB — TSH: TSH: 2.1 u[IU]/mL (ref 0.35–5.50)

## 2024-03-22 LAB — VITAMIN D 25 HYDROXY (VIT D DEFICIENCY, FRACTURES): VITD: 58.71 ng/mL (ref 30.00–100.00)

## 2024-03-25 DIAGNOSIS — L570 Actinic keratosis: Secondary | ICD-10-CM | POA: Diagnosis not present

## 2024-03-25 DIAGNOSIS — L821 Other seborrheic keratosis: Secondary | ICD-10-CM | POA: Diagnosis not present

## 2024-03-25 DIAGNOSIS — D492 Neoplasm of unspecified behavior of bone, soft tissue, and skin: Secondary | ICD-10-CM | POA: Diagnosis not present

## 2024-03-25 DIAGNOSIS — L814 Other melanin hyperpigmentation: Secondary | ICD-10-CM | POA: Diagnosis not present

## 2024-03-26 ENCOUNTER — Encounter: Payer: Self-pay | Admitting: Family

## 2024-03-27 ENCOUNTER — Other Ambulatory Visit: Payer: Self-pay | Admitting: Family

## 2024-03-27 DIAGNOSIS — G250 Essential tremor: Secondary | ICD-10-CM

## 2024-04-29 DIAGNOSIS — L570 Actinic keratosis: Secondary | ICD-10-CM | POA: Diagnosis not present

## 2024-05-10 DIAGNOSIS — H2513 Age-related nuclear cataract, bilateral: Secondary | ICD-10-CM | POA: Diagnosis not present

## 2024-05-10 DIAGNOSIS — H353131 Nonexudative age-related macular degeneration, bilateral, early dry stage: Secondary | ICD-10-CM | POA: Diagnosis not present

## 2024-05-10 DIAGNOSIS — H11153 Pinguecula, bilateral: Secondary | ICD-10-CM | POA: Diagnosis not present

## 2024-05-10 DIAGNOSIS — H04123 Dry eye syndrome of bilateral lacrimal glands: Secondary | ICD-10-CM | POA: Diagnosis not present

## 2024-05-22 DIAGNOSIS — H2511 Age-related nuclear cataract, right eye: Secondary | ICD-10-CM | POA: Diagnosis not present

## 2024-05-22 DIAGNOSIS — H2512 Age-related nuclear cataract, left eye: Secondary | ICD-10-CM | POA: Diagnosis not present

## 2024-05-23 ENCOUNTER — Encounter: Payer: Self-pay | Admitting: Ophthalmology

## 2024-05-24 ENCOUNTER — Encounter: Payer: Self-pay | Admitting: Ophthalmology

## 2024-05-24 NOTE — Anesthesia Preprocedure Evaluation (Addendum)
 Anesthesia Evaluation  Patient identified by MRN, date of birth, ID band Patient awake    Reviewed: Allergy & Precautions, H&P , NPO status , Patient's Chart, lab work & pertinent test results  History of Anesthesia Complications (+) PONV and history of anesthetic complications  Airway Mallampati: II  TM Distance: <3 FB Neck ROM: Full    Dental no notable dental hx. (+) Caps Unsure where caps are located:   Pulmonary neg pulmonary ROS   Pulmonary exam normal breath sounds clear to auscultation       Cardiovascular hypertension, + CAD  negative cardio ROS Normal cardiovascular exam+ dysrhythmias  Rhythm:Regular Rate:Normal  a. 11/2018 CT Cor Ca2+ = Zero.  04-18-18 echo hx murmur Left ventricle: The cavity size was normal. Systolic function was    normal. The estimated ejection fraction was in the range of 60%    to 65%. Wall motion was normal; there were no regional wall    motion abnormalities. Features are consistent with a pseudonormal    left ventricular filling pattern, with concomitant abnormal    relaxation and increased filling pressure (grade 2 diastolic    dysfunction).  - Mitral valve: Mildly thickened leaflets . Suspect mild prolapse    of the anterior leaflet There was mild to moderate regurgitation.  - Left atrium: The atrium was at the upper limits of normal in    size.  - Right ventricle: Systolic function was normal.  - Tricuspid valve: There was mild-moderate regurgitation.  - Pulmonary arteries: Systolic pressure was within the normal     Neuro/Psych  Headaches PSYCHIATRIC DISORDERS       Neuromuscular disease negative neurological ROS  negative psych ROS   GI/Hepatic negative GI ROS, Neg liver ROS, hiatal hernia,GERD  ,,  Endo/Other  negative endocrine ROSHypothyroidism    Renal/GU negative Renal ROS  negative genitourinary   Musculoskeletal negative musculoskeletal ROS (+) Arthritis ,     Abdominal   Peds negative pediatric ROS (+)  Hematology negative hematology ROS (+)   Anesthesia Other Findings Gastric reflux GERD (gastroesophageal reflux disease) Headache  Arthritis Complication of anesthesia  PONV (postoperative nausea and vomiting) Hypertension  Hiatal hernia Family history of breast cancer  Cancer (HCC) Breast cancer (HCC)  Personal history of radiation therapy PSVT (paroxysmal supraventricular tachycardia)  Mitral regurgitation History of Coronary CT for Calcium Scoring Dysrhythmia Squamous cell carcinoma of skin  Basal cell carcinoma Basal cell carcinoma Actinic keratosis Atrial tachycardia (HCC) Atherosclerosis of aorta (HCC) Tremor of both hands  Mood disorder (HCC) History of ductal carcinoma in situ (DCIS) of left breast  Lichen sclerosus et atrophicus Essential tremor  Grade II diastolic dysfunction Moderate mitral regurgitation by prior echocardiogram     Reproductive/Obstetrics negative OB ROS                             Anesthesia Physical Anesthesia Plan  ASA: 3  Anesthesia Plan: MAC   Post-op Pain Management:    Induction: Intravenous  PONV Risk Score and Plan:   Airway Management Planned: Natural Airway and Nasal Cannula  Additional Equipment:   Intra-op Plan:   Post-operative Plan:   Informed Consent: I have reviewed the patients History and Physical, chart, labs and discussed the procedure including the risks, benefits and alternatives for the proposed anesthesia with the patient or authorized representative who has indicated his/her understanding and acceptance.     Dental Advisory Given  Plan Discussed with: Anesthesiologist, CRNA and  Surgeon  Anesthesia Plan Comments: (Patient consented for risks of anesthesia including but not limited to:  - adverse reactions to medications - damage to eyes, teeth, lips or other oral mucosa - nerve damage due to positioning  - sore throat or  hoarseness - Damage to heart, brain, nerves, lungs, other parts of body or loss of life  Patient voiced understanding and assent.)        Anesthesia Quick Evaluation

## 2024-05-28 NOTE — Discharge Instructions (Signed)

## 2024-05-29 ENCOUNTER — Other Ambulatory Visit: Payer: Self-pay

## 2024-05-29 ENCOUNTER — Encounter: Payer: Self-pay | Admitting: Ophthalmology

## 2024-05-29 ENCOUNTER — Ambulatory Visit: Payer: Self-pay | Admitting: Anesthesiology

## 2024-05-29 ENCOUNTER — Ambulatory Visit
Admission: RE | Admit: 2024-05-29 | Discharge: 2024-05-29 | Disposition: A | Discharge status: Home / Self Care | Attending: Ophthalmology | Admitting: Ophthalmology

## 2024-05-29 ENCOUNTER — Encounter
Admission: RE | Disposition: A | Discharge status: Home / Self Care | Payer: Self-pay | Source: Home / Self Care | Attending: Ophthalmology

## 2024-05-29 DIAGNOSIS — Z79899 Other long term (current) drug therapy: Secondary | ICD-10-CM | POA: Diagnosis not present

## 2024-05-29 DIAGNOSIS — H2512 Age-related nuclear cataract, left eye: Secondary | ICD-10-CM | POA: Insufficient documentation

## 2024-05-29 DIAGNOSIS — R519 Headache, unspecified: Secondary | ICD-10-CM | POA: Diagnosis not present

## 2024-05-29 DIAGNOSIS — I081 Rheumatic disorders of both mitral and tricuspid valves: Secondary | ICD-10-CM | POA: Insufficient documentation

## 2024-05-29 DIAGNOSIS — K449 Diaphragmatic hernia without obstruction or gangrene: Secondary | ICD-10-CM | POA: Insufficient documentation

## 2024-05-29 DIAGNOSIS — K219 Gastro-esophageal reflux disease without esophagitis: Secondary | ICD-10-CM | POA: Insufficient documentation

## 2024-05-29 DIAGNOSIS — I2510 Atherosclerotic heart disease of native coronary artery without angina pectoris: Secondary | ICD-10-CM | POA: Diagnosis not present

## 2024-05-29 DIAGNOSIS — E039 Hypothyroidism, unspecified: Secondary | ICD-10-CM | POA: Insufficient documentation

## 2024-05-29 DIAGNOSIS — I10 Essential (primary) hypertension: Secondary | ICD-10-CM | POA: Diagnosis not present

## 2024-05-29 DIAGNOSIS — M1990 Unspecified osteoarthritis, unspecified site: Secondary | ICD-10-CM | POA: Diagnosis not present

## 2024-05-29 DIAGNOSIS — I340 Nonrheumatic mitral (valve) insufficiency: Secondary | ICD-10-CM | POA: Diagnosis not present

## 2024-05-29 HISTORY — DX: Atherosclerosis of aorta: I70.0

## 2024-05-29 HISTORY — DX: Tremor, unspecified: R25.1

## 2024-05-29 HISTORY — DX: Unspecified mood (affective) disorder: F39

## 2024-05-29 HISTORY — PX: CATARACT EXTRACTION W/PHACO: SHX586

## 2024-05-29 HISTORY — DX: Essential tremor: G25.0

## 2024-05-29 HISTORY — DX: Other supraventricular tachycardia: I47.19

## 2024-05-29 HISTORY — DX: Nonrheumatic mitral (valve) insufficiency: I34.0

## 2024-05-29 HISTORY — DX: Lichen sclerosus et atrophicus: L90.0

## 2024-05-29 HISTORY — DX: Other ill-defined heart diseases: I51.89

## 2024-05-29 HISTORY — DX: Personal history of in-situ neoplasm of breast: Z86.000

## 2024-05-29 SURGERY — PHACOEMULSIFICATION, CATARACT, WITH IOL INSERTION
Anesthesia: Monitor Anesthesia Care | Site: Eye | Laterality: Left

## 2024-05-29 MED ORDER — MIDAZOLAM HCL 2 MG/2ML IJ SOLN
INTRAMUSCULAR | Status: AC
  Filled 2024-05-29: qty 2

## 2024-05-29 MED ORDER — TETRACAINE HCL 0.5 % OP SOLN
1.0000 [drp] | OPHTHALMIC | Status: DC | PRN
Start: 2024-05-29 — End: 2024-05-29
  Administered 2024-05-29 (×3): 1 [drp] via OPHTHALMIC

## 2024-05-29 MED ORDER — LACTATED RINGERS IV SOLN: INTRAVENOUS | Status: DC

## 2024-05-29 MED ORDER — FENTANYL CITRATE (PF) 100 MCG/2ML IJ SOLN
INTRAMUSCULAR | Status: DC | PRN
  Administered 2024-05-29: 50 ug via INTRAVENOUS

## 2024-05-29 MED ORDER — LIDOCAINE HCL (PF) 2 % IJ SOLN
INTRAOCULAR | Status: DC | PRN
  Administered 2024-05-29: 2 mL

## 2024-05-29 MED ORDER — FENTANYL CITRATE (PF) 100 MCG/2ML IJ SOLN
INTRAMUSCULAR | Status: AC
  Filled 2024-05-29: qty 2

## 2024-05-29 MED ORDER — SIGHTPATH DOSE#1 NA HYALUR & NA CHOND-NA HYALUR IO KIT
PACK | INTRAOCULAR | Status: DC | PRN
  Administered 2024-05-29: 1 via OPHTHALMIC

## 2024-05-29 MED ORDER — CEFUROXIME OPHTHALMIC INJECTION 1 MG/0.1 ML
INJECTION | OPHTHALMIC | Status: DC | PRN
Start: 2024-05-29 — End: 2024-05-29
  Administered 2024-05-29: .1 mL via INTRACAMERAL

## 2024-05-29 MED ORDER — TETRACAINE HCL 0.5 % OP SOLN
OPHTHALMIC | Status: AC
  Filled 2024-05-29: qty 4

## 2024-05-29 MED ORDER — BRIMONIDINE TARTRATE-TIMOLOL 0.2-0.5 % OP SOLN
OPHTHALMIC | Status: DC | PRN
  Administered 2024-05-29: 1 [drp] via OPHTHALMIC

## 2024-05-29 MED ORDER — ARMC OPHTHALMIC DILATING DROPS
OPHTHALMIC | Status: AC
  Filled 2024-05-29: qty 0.5

## 2024-05-29 MED ORDER — ONDANSETRON HCL 4 MG/2ML IJ SOLN
INTRAMUSCULAR | Status: DC | PRN
  Administered 2024-05-29: 4 mg via INTRAVENOUS

## 2024-05-29 MED ORDER — MIDAZOLAM HCL 2 MG/2ML IJ SOLN
INTRAMUSCULAR | Status: DC | PRN
  Administered 2024-05-29: 1 mg via INTRAVENOUS

## 2024-05-29 MED ORDER — SIGHTPATH DOSE#1 BSS IO SOLN
INTRAOCULAR | Status: DC | PRN
  Administered 2024-05-29: 84 mL via OPHTHALMIC

## 2024-05-29 MED ORDER — ARMC OPHTHALMIC DILATING DROPS
1.0000 | OPHTHALMIC | Status: DC | PRN
  Administered 2024-05-29 (×3): 1 via OPHTHALMIC

## 2024-05-29 MED ORDER — SIGHTPATH DOSE#1 BSS IO SOLN
INTRAOCULAR | Status: DC | PRN
Start: 2024-05-29 — End: 2024-05-29
  Administered 2024-05-29: 15 mL via INTRAOCULAR

## 2024-05-29 MED ORDER — ONDANSETRON HCL 4 MG/2ML IJ SOLN
INTRAMUSCULAR | Status: AC
  Filled 2024-05-29: qty 2

## 2024-05-29 SURGICAL SUPPLY — 11 items
CATARACT SUITE SIGHTPATH (MISCELLANEOUS) ×1 IMPLANT
DRSG TELFA 3X8 NADH STRL (GAUZE/BANDAGES/DRESSINGS) IMPLANT
FEE CATARACT SUITE SIGHTPATH (MISCELLANEOUS) ×1 IMPLANT
GLOVE BIOGEL PI IND STRL 8 (GLOVE) ×1 IMPLANT
GLOVE SURG LX STRL 7.5 STRW (GLOVE) ×1 IMPLANT
GLOVE SURG PROTEXIS BL SZ6.5 (GLOVE) ×1 IMPLANT
GLOVE SURG SYN 6.5 PF PI BL (GLOVE) ×1 IMPLANT
LENS IOL TECNIS EYHANCE 20.5 (Intraocular Lens) IMPLANT
NDL FILTER BLUNT 18X1 1/2 (NEEDLE) ×1 IMPLANT
NEEDLE FILTER BLUNT 18X1 1/2 (NEEDLE) ×1 IMPLANT
SYR 3ML LL SCALE MARK (SYRINGE) ×1 IMPLANT

## 2024-05-29 NOTE — Transfer of Care (Signed)
 Immediate Anesthesia Transfer of Care Note  Patient: Susan [name]  Procedure(s) Performed: PHACOEMULSIFICATION, CATARACT, WITH IOL INSERTION 5.56 00:34.3 (Left: Eye)  Patient Location: PACU  Anesthesia Type: MAC  Level of Consciousness: awake, alert  and patient cooperative  Airway and Oxygen Therapy: Patient Spontanous Breathing and Patient connected to supplemental oxygen  Post-op Assessment: Post-op Vital signs reviewed, Patient's Cardiovascular Status Stable, Respiratory Function Stable, Patent Airway and No signs of Nausea or vomiting  Post-op Vital Signs: Reviewed and stable  Complications: No notable events documented.

## 2024-05-29 NOTE — Op Note (Signed)
 OPERATIVE NOTE  Susan [name] [id number] 05/29/2024   PREOPERATIVE DIAGNOSIS:  Nuclear sclerotic cataract left eye. H25.12   POSTOPERATIVE DIAGNOSIS:    Nuclear sclerotic cataract left eye.     PROCEDURE:  Phacoemusification with posterior chamber intraocular lens placement of the left eye  Ultrasound time: Procedure(s): PHACOEMULSIFICATION, CATARACT, WITH IOL INSERTION 5.56 00:34.3 (Left)  LENS:   Implant Name Type Inv. Item Serial No. Manufacturer Lot No. LRB No. Used Action  LENS IOL TECNIS EYHANCE 20.5 - [id number] Intraocular Lens LENS IOL TECNIS EYHANCE 20.5 [id number] [employer]  Left 1 Implanted      SURGEON:  Berline Brenner, MD   ANESTHESIA:  Topical with tetracaine drops and 2% Xylocaine  jelly, augmented with 1% preservative-free intracameral lidocaine .    COMPLICATIONS:  None.   DESCRIPTION OF PROCEDURE:  The patient was identified in the holding room and transported to the operating room and placed in the supine position under the operating microscope.  The left eye was identified as the operative eye and it was prepped and draped in the usual sterile ophthalmic fashion.   A 1 millimeter clear-corneal paracentesis was made at the 1:30 position.  0.5 ml of preservative-free 1% lidocaine  was injected into the anterior chamber.  The anterior chamber was filled with Viscoat viscoelastic.  A 2.4 millimeter keratome was used to make a near-clear corneal incision at the 10:30 position.  .  A curvilinear capsulorrhexis was made with a cystotome and capsulorrhexis forceps.  Balanced salt solution was used to hydrodissect and hydrodelineate the nucleus.   Phacoemulsification was then used in stop and chop fashion to remove the lens nucleus and epinucleus.  The remaining cortex was then removed using the irrigation and aspiration handpiece. Provisc was then placed into the capsular bag to distend it for lens placement.  A lens was then injected into the capsular bag.  The  remaining viscoelastic was aspirated.   Wounds were hydrated with balanced salt solution.  The anterior chamber was inflated to a physiologic pressure with balanced salt solution.  No wound leaks were noted. Cefuroxime  0.1 ml of a 10mg /ml solution was injected into the anterior chamber for a dose of 1 mg of intracameral antibiotic at the completion of the case.   Timolol and Brimonidine drops were applied to the eye.  The patient was taken to the recovery room in stable condition without complications of anesthesia or surgery.  Susan [name] 05/29/2024, 1:40 PM

## 2024-05-29 NOTE — Anesthesia Postprocedure Evaluation (Signed)
 Anesthesia Post Note  Patient: Susan [name]  Procedure(s) Performed: PHACOEMULSIFICATION, CATARACT, WITH IOL INSERTION 5.56 00:34.3 (Left: Eye)  Patient location during evaluation: PACU Anesthesia Type: MAC Level of consciousness: awake and alert Pain management: pain level controlled Vital Signs Assessment: post-procedure vital signs reviewed and stable Respiratory status: spontaneous breathing, nonlabored ventilation, respiratory function stable and patient connected to nasal cannula oxygen Cardiovascular status: stable and blood pressure returned to baseline Postop Assessment: no apparent nausea or vomiting Anesthetic complications: no   No notable events documented.   Last Vitals:  Vitals:   05/29/24 1341 05/29/24 1345  BP: 106/69 111/61  Pulse: (!) 56 (!) 53  Resp: 20 13  Temp: 36.6 C   SpO2: 98% 97%    Last Pain:  Vitals:   05/29/24 1341  TempSrc:   PainSc: 0-No pain                 Mack Alvidrez C Daisie Haft

## 2024-05-29 NOTE — H&P (Signed)
 [facility]   Primary Care Physician:  Calista Catching, FNP Ophthalmologist: Dr. Annell Kidney  Pre-Procedure History & Physical: HPI:  Susan [name] is a 73 y.o. female here for ophthalmic surgery.   Past Medical History:  Diagnosis Date   Actinic keratosis    Arthritis    Atherosclerosis of aorta (HCC)    Atrial tachycardia (HCC)    Basal cell carcinoma 05/26/2016   R chest parasternal   Basal cell carcinoma 01/21/2015   L nose supratip    Breast cancer (HCC)    Left DCIS   Cancer (HCC)    Complication of anesthesia    Dysrhythmia    PSVT on metoprolol    Essential tremor    Family history of breast cancer    Gastric reflux 90's   GERD (gastroesophageal reflux disease)    Grade II diastolic dysfunction    Headache    H/O MIGRAINES   Hiatal hernia 2003   History of Coronary CT for Calcium Scoring    a. 11/2018 CT Cor Ca2+ = Zero.   History of ductal carcinoma in situ (DCIS) of left breast    Hypertension    Lichen sclerosus et atrophicus    Mitral regurgitation    a. 03/2017 Echo: EF 55-60%, no nrwma, Mod MR; b. 03/2018 Echo: EF 60-65%, no rwma. Gr2 DD. Mild MV prolapse of ant leaflet w/ mild to mod mR. Nl RV fxn. Mild to mod TR.   Moderate mitral regurgitation by prior echocardiogram    Mood disorder [facility])    Personal history of radiation therapy 2019   LEFT lumpectomy   PONV (postoperative nausea and vomiting)    N/V   PSVT (paroxysmal supraventricular tachycardia) (HCC)    a. 03/2017 Event Monitor: Avg HR 66, 10 episodes of SVT up to 42.8 secs, fastest 148 bpm. 8 beats WCT (179 bpm) - VT vs SVT w/ aberrancy. Rare PACs (2.6% total beats).   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   R prox lat bicep   Tremor of both hands     Past Surgical History:  Procedure Laterality Date   AUGMENTATION MAMMAPLASTY Bilateral 01/17/2020   BREAST BIOPSY Left 06/13/2018   Affirm Bx- coil clip   atypical sclerosing and DCIS   BREAST ENHANCEMENT SURGERY  Bilateral 01/17/2020   Procedure: MAMMOPLASTY AUGMENTATION WITH PROSTHESIS (BREAST);  Surgeon: Barb Bonito, MD;  Location: [facility];  Service: Plastics;  Laterality: Bilateral;  2.5 hours for total case   BREAST EXCISIONAL BIOPSY Right 2000   benign   BREAST LUMPECTOMY WITH NEEDLE LOCALIZATION Left 06/26/2018   Procedure: BREAST LUMPECTOMY WITH NEEDLE LOCALIZATION;  Surgeon: Alben Alma, MD;  Location: [facility];  Service: General;  Laterality: Left;   CHOLECYSTECTOMY     OVARIAN CYST SURGERY     SCAR REVISION Left 01/17/2020   Procedure: POSSIBLE SCAR REVISION TO LEFT BREAST;  Surgeon: Barb Bonito, MD;  Location: [facility];  Service: Plastics;  Laterality: Left;   SKIN FULL THICKNESS GRAFT  01/17/2020   Procedure: SKIN GRAFT FULL THICKNESS;  Surgeon: Barb Bonito, MD;  Location: [facility];  Service: Plastics;;   TONSILLECTOMY     age 73   UPPER GI ENDOSCOPY  2003    Prior to Admission medications   Medication Sig Start Date End Date Taking? Authorizing Provider  ALPRAZolam  (XANAX ) 0.5 MG tablet Take 0.5-1 tablets (0.25-0.5 mg total) by mouth daily as needed. for sleep 03/04/24  Yes  Calista Catching, FNP  calcium citrate-vitamin D  500-500 MG-UNIT chewable tablet Chew 1 tablet by mouth 2 (two) times daily.   Yes [provider]  cholecalciferol (VITAMIN D ) 1000 units tablet Take 1,000 Units by mouth daily.   Yes [provider]  citalopram  (CELEXA ) 20 MG tablet Take 1 tablet (20 mg total) by mouth daily. 09/14/23  Yes Arnett, Hanley Lew, FNP  clobetasol  ointment (TEMOVATE ) 0.05 % APPLY TO AFFECTED AREA EVERY NIGHT FOR 4 WEEKS, THEN EVERY OTHER DAY FOR 4 WEEKS AND THEN TWICE A WEEK FOR 4 WEEKS OR UNTIL RESOLUTION. 10/12/23  Yes Constant, Peggy, MD  famotidine (PEPCID) 20 MG tablet Take 20 mg by mouth as needed for heartburn or indigestion.   Yes [provider]  fluticasone (FLONASE) 50 MCG/ACT nasal spray  Place 2 sprays into both nostrils as needed.    Yes [provider]  meloxicam  (MOBIC ) 15 MG tablet Take 1 tablet (15 mg total) by mouth at bedtime. Use sparingly. Take with food. 01/04/24 07/02/24 Yes Arnett, Hanley Lew, FNP  metoprolol  succinate (TOPROL -XL) 25 MG 24 hr tablet TAKE 1/2 TABLET BY MOUTH DAILY 08/17/23  Yes Gollan, Timothy J, MD  Multiple Vitamin (MULTIVITAMIN) tablet Take 1 tablet by mouth daily.   Yes [provider]  psyllium (REGULOID) 0.52 g capsule Take 0.52 g by mouth 3 (three) times daily.    Yes [provider]  SIMETHICONE-80 PO Take 80 mg by mouth as needed (gas).    Yes [provider]  topiramate  (TOPAMAX ) 25 MG tablet Take 1 tablet (25 mg total) by mouth daily. 03/27/24  Yes Calista Catching, FNP  cyclobenzaprine  (FLEXERIL ) 5 MG tablet Take 1-2 tablets (5-10 mg total) by mouth at bedtime as needed for muscle spasms. 06/13/23   Calista Catching, FNP  Ivermectin  1 % CREA Apply 1 Application topically at bedtime. qhs to face for Rosacea 09/13/23   Elta Halter, MD    Allergies as of 05/14/2024   (No Known Allergies)    Family History  Problem Relation Age of Onset   Hyperlipidemia Mother    Hypertension Mother    COPD Mother    Heart failure Mother        deceased 37   Stroke Father 65   Hypertension Father    Hypertension Sister    Hypertension Brother    Healthy Brother    Melanoma Paternal Uncle    Breast cancer Cousin 40       daughter of paternal aunt; currently 75   Breast cancer Cousin 24       daughter of paternal uncle; currently 14   Breast cancer Cousin 32       daughter of maternal uncle; deceased 34   Breast cancer Cousin 22       daughter of same maternal uncle; currently 60   Testicular cancer Other 87       sister's son; currently 59   Coronary artery disease Neg Hx     Social History   Socioeconomic History   Marital status: Married    Spouse name: Richard   Number of children: 2   Years of  education: College   Highest education level: Bachelor's degree (e.g., BA, AB, BS)  Occupational History   Occupation: retired  Tobacco Use   Smoking status: Never   Smokeless tobacco: Never  Vaping Use   Vaping status: Never Used  Substance and Sexual Activity   Alcohol use: Yes    Alcohol/week:  7.0 standard drinks of alcohol    Types: 7 Glasses of wine per week    Comment: daily WINE   Drug use: No   Sexual activity: Not Currently    Birth control/protection: Post-menopausal  Other Topics Concern   Not on file  Social History Narrative   Married Engineer, 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      From: the area, [place]   Living: with husband Rich Champ [phone])   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      Family: 2 step children - Daivd Dub and Harleigh - 3 grandchildren, good relationships      Enjoys: garden, flowers      Exercise: walking, walking the dog IT sales professional doodle) - daily, average 6 mile/day   Diet: fairly good       Safety   Seat belts: Yes    Guns: Yes  and secure   Safe in relationships: Yes    Social Drivers of Corporate investment banker Strain: Low Risk  (03/13/2024)   Overall Financial Resource Strain (CARDIA)    Difficulty of Paying Living Expenses: Not hard at all  Food Insecurity: No Food Insecurity (03/13/2024)   Hunger Vital Sign    Worried About Running Out of Food in the Last Year: Never true    Ran Out of Food in the Last Year: Never true  Transportation Needs: No Transportation Needs (03/13/2024)   PRAPARE - Administrator, Civil Service (Medical): No    Lack of Transportation (Non-Medical): No  Physical Activity: Sufficiently Active (03/13/2024)   Exercise Vital Sign    Days of Exercise per Week: 6 days    Minutes of Exercise per Session: 40 min  Stress: No Stress Concern Present (03/13/2024)   [employer] of Occupational Health - Occupational Stress Questionnaire    Feeling of Stress : Not at all  Social Connections: Moderately Integrated  (03/13/2024)   Social Connection and Isolation Panel [NHANES]    Frequency of Communication with Friends and Family: More than three times a week    Frequency of Social Gatherings with Friends and Family: More than three times a week    Attends Religious Services: Never    Database administrator or Organizations: Yes    Attends Engineer, structural: More than 4 times per year    Marital Status: Married  Catering manager Violence: Not At Risk (03/13/2024)   Humiliation, Afraid, Rape, and Kick questionnaire    Fear of Current or Ex-Partner: No    Emotionally Abused: No    Physically Abused: No    Sexually Abused: No    Review of Systems: See HPI, otherwise negative ROS  Physical Exam: BP 131/65   Pulse (!) 59   Temp 97.6 F (36.4 C) (Temporal)   Resp 20   Ht 5\' 7"  (1.702 m)   Wt 60.3 kg   SpO2 97%   BMI 20.83 kg/m  General:   Alert,  pleasant and cooperative in NAD Head:  Normocephalic and atraumatic. Lungs:  Clear to auscultation.    Heart:  Regular rate and rhythm.   Impression/Plan: Susan [name] is here for ophthalmic surgery.  Risks, benefits, limitations, and alternatives regarding ophthalmic surgery have been reviewed with the patient.  Questions have been answered.  All parties agreeable.   Annell Kidney, MD  05/29/2024, 12:31 PM

## 2024-05-30 ENCOUNTER — Encounter: Payer: Self-pay | Admitting: Ophthalmology

## 2024-06-04 DIAGNOSIS — H2511 Age-related nuclear cataract, right eye: Secondary | ICD-10-CM | POA: Diagnosis not present

## 2024-06-10 NOTE — Discharge Instructions (Signed)

## 2024-06-12 ENCOUNTER — Ambulatory Visit
Admission: RE | Admit: 2024-06-12 | Discharge: 2024-06-12 | Disposition: A | Discharge status: Home / Self Care | Attending: Ophthalmology | Admitting: Ophthalmology

## 2024-06-12 ENCOUNTER — Encounter: Payer: Self-pay | Admitting: Ophthalmology

## 2024-06-12 ENCOUNTER — Other Ambulatory Visit: Payer: Self-pay

## 2024-06-12 ENCOUNTER — Encounter
Admission: RE | Disposition: A | Discharge status: Home / Self Care | Payer: Self-pay | Source: Home / Self Care | Attending: Ophthalmology

## 2024-06-12 ENCOUNTER — Ambulatory Visit: Payer: Self-pay | Admitting: Anesthesiology

## 2024-06-12 DIAGNOSIS — K449 Diaphragmatic hernia without obstruction or gangrene: Secondary | ICD-10-CM | POA: Diagnosis not present

## 2024-06-12 DIAGNOSIS — K219 Gastro-esophageal reflux disease without esophagitis: Secondary | ICD-10-CM | POA: Insufficient documentation

## 2024-06-12 DIAGNOSIS — I2510 Atherosclerotic heart disease of native coronary artery without angina pectoris: Secondary | ICD-10-CM | POA: Diagnosis not present

## 2024-06-12 DIAGNOSIS — Z79899 Other long term (current) drug therapy: Secondary | ICD-10-CM | POA: Diagnosis not present

## 2024-06-12 DIAGNOSIS — I10 Essential (primary) hypertension: Secondary | ICD-10-CM | POA: Insufficient documentation

## 2024-06-12 DIAGNOSIS — E039 Hypothyroidism, unspecified: Secondary | ICD-10-CM | POA: Insufficient documentation

## 2024-06-12 DIAGNOSIS — H2511 Age-related nuclear cataract, right eye: Secondary | ICD-10-CM | POA: Diagnosis not present

## 2024-06-12 DIAGNOSIS — I341 Nonrheumatic mitral (valve) prolapse: Secondary | ICD-10-CM | POA: Diagnosis not present

## 2024-06-12 DIAGNOSIS — M1990 Unspecified osteoarthritis, unspecified site: Secondary | ICD-10-CM | POA: Insufficient documentation

## 2024-06-12 HISTORY — PX: CATARACT EXTRACTION W/PHACO: SHX586

## 2024-06-12 SURGERY — PHACOEMULSIFICATION, CATARACT, WITH IOL INSERTION
Anesthesia: Monitor Anesthesia Care | Site: Eye | Laterality: Right

## 2024-06-12 MED ORDER — ONDANSETRON HCL 4 MG/2ML IJ SOLN
INTRAMUSCULAR | Status: DC | PRN
  Administered 2024-06-12: 4 mg via INTRAVENOUS

## 2024-06-12 MED ORDER — ONDANSETRON HCL 4 MG/2ML IJ SOLN
INTRAMUSCULAR | Status: AC
Start: 2024-06-12 — End: 2024-06-12
  Filled 2024-06-12: qty 2

## 2024-06-12 MED ORDER — MIDAZOLAM HCL 2 MG/2ML IJ SOLN
INTRAMUSCULAR | Status: DC | PRN
Start: 2024-06-12 — End: 2024-06-12
  Administered 2024-06-12: 1 mg via INTRAVENOUS

## 2024-06-12 MED ORDER — DEXMEDETOMIDINE HCL IN NACL 80 MCG/20ML IV SOLN
INTRAVENOUS | Status: AC
  Filled 2024-06-12: qty 20

## 2024-06-12 MED ORDER — TETRACAINE HCL 0.5 % OP SOLN
1.0000 [drp] | OPHTHALMIC | Status: DC | PRN
  Administered 2024-06-12 (×3): 1 [drp] via OPHTHALMIC

## 2024-06-12 MED ORDER — ARMC OPHTHALMIC DILATING DROPS
OPHTHALMIC | Status: AC
  Filled 2024-06-12: qty 0.5

## 2024-06-12 MED ORDER — CEFUROXIME OPHTHALMIC INJECTION 1 MG/0.1 ML
INJECTION | OPHTHALMIC | Status: DC | PRN
  Administered 2024-06-12: .1 mL via INTRACAMERAL

## 2024-06-12 MED ORDER — FENTANYL CITRATE (PF) 100 MCG/2ML IJ SOLN
INTRAMUSCULAR | Status: AC
  Filled 2024-06-12: qty 2

## 2024-06-12 MED ORDER — LIDOCAINE HCL (PF) 2 % IJ SOLN
INTRAOCULAR | Status: DC | PRN
  Administered 2024-06-12: 2 mL

## 2024-06-12 MED ORDER — MIDAZOLAM HCL 2 MG/2ML IJ SOLN
INTRAMUSCULAR | Status: AC
  Filled 2024-06-12: qty 2

## 2024-06-12 MED ORDER — ARMC OPHTHALMIC DILATING DROPS
1.0000 | OPHTHALMIC | Status: DC | PRN
  Administered 2024-06-12 (×3): 1 via OPHTHALMIC

## 2024-06-12 MED ORDER — BRIMONIDINE TARTRATE-TIMOLOL 0.2-0.5 % OP SOLN
OPHTHALMIC | Status: DC | PRN
  Administered 2024-06-12: 1 [drp] via OPHTHALMIC

## 2024-06-12 MED ORDER — TETRACAINE HCL 0.5 % OP SOLN
OPHTHALMIC | Status: AC
Start: 2024-06-12 — End: 2024-06-12
  Filled 2024-06-12: qty 4

## 2024-06-12 MED ORDER — SIGHTPATH DOSE#1 NA HYALUR & NA CHOND-NA HYALUR IO KIT
PACK | INTRAOCULAR | Status: DC | PRN
  Administered 2024-06-12: 1 via OPHTHALMIC

## 2024-06-12 MED ORDER — SIGHTPATH DOSE#1 BSS IO SOLN
INTRAOCULAR | Status: DC | PRN
  Administered 2024-06-12: 15 mL via INTRAOCULAR

## 2024-06-12 MED ORDER — FENTANYL CITRATE (PF) 100 MCG/2ML IJ SOLN
INTRAMUSCULAR | Status: DC | PRN
  Administered 2024-06-12 (×2): 25 ug via INTRAVENOUS

## 2024-06-12 MED ORDER — SIGHTPATH DOSE#1 BSS IO SOLN
INTRAOCULAR | Status: DC | PRN
  Administered 2024-06-12: 63 mL via OPHTHALMIC

## 2024-06-12 SURGICAL SUPPLY — 10 items
CATARACT SUITE SIGHTPATH (MISCELLANEOUS) ×1 IMPLANT
FEE CATARACT SUITE SIGHTPATH (MISCELLANEOUS) ×1 IMPLANT
GLOVE BIOGEL PI IND STRL 8 (GLOVE) ×1 IMPLANT
GLOVE SURG LX STRL 7.5 STRW (GLOVE) ×1 IMPLANT
GLOVE SURG PROTEXIS BL SZ6.5 (GLOVE) ×1 IMPLANT
GLOVE SURG SYN 6.5 PF PI BL (GLOVE) ×1 IMPLANT
LENS IOL TECNIS EYHANCE 21.0 (Intraocular Lens) IMPLANT
NDL FILTER BLUNT 18X1 1/2 (NEEDLE) ×1 IMPLANT
NEEDLE FILTER BLUNT 18X1 1/2 (NEEDLE) ×1 IMPLANT
SYR 3ML LL SCALE MARK (SYRINGE) ×1 IMPLANT

## 2024-06-12 NOTE — Op Note (Signed)
 LOCATION:  [facility]   PREOPERATIVE DIAGNOSIS:    Nuclear sclerotic cataract right eye. H25.11   POSTOPERATIVE DIAGNOSIS:  Nuclear sclerotic cataract right eye.     PROCEDURE:  Phacoemusification with posterior chamber intraocular lens placement of the right eye   ULTRASOUND TIME: Procedure(s): PHACOEMULSIFICATION, CATARACT, WITH IOL INSERTION 6.37 00:39.2 (Right)  LENS:   Implant Name Type Inv. Item Serial No. Manufacturer Lot No. LRB No. Used Action  LENS IOL TECNIS EYHANCE 21.0 - [id number] Intraocular Lens LENS IOL TECNIS EYHANCE 21.0 [id number] [employer]  Right 1 Implanted         SURGEON:  Berline Brenner, MD   ANESTHESIA:  Topical with tetracaine  drops and 2% Xylocaine  jelly, augmented with 1% preservative-free intracameral lidocaine .    COMPLICATIONS:  None.   DESCRIPTION OF PROCEDURE:  The patient was identified in the holding room and transported to the operating room and placed in the supine position under the operating microscope.  The right eye was identified as the operative eye and it was prepped and draped in the usual sterile ophthalmic fashion.   A 1 millimeter clear-corneal paracentesis was made at the 12:00 position.  0.5 ml of preservative-free 1% lidocaine  was injected into the anterior chamber. The anterior chamber was filled with Viscoat viscoelastic.  A 2.4 millimeter keratome was used to make a near-clear corneal incision at the 9:00 position.  A curvilinear capsulorrhexis was made with a cystotome and capsulorrhexis forceps.  Balanced salt  solution was used to hydrodissect and hydrodelineate the nucleus.   Phacoemulsification was then used in stop and chop fashion to remove the lens nucleus and epinucleus.  The remaining cortex was then removed using the irrigation and aspiration handpiece. Provisc was then placed into the capsular bag to distend it for lens placement.  A lens was then injected into the capsular bag.  The remaining  viscoelastic was aspirated.   Wounds were hydrated with balanced salt  solution.  The anterior chamber was inflated to a physiologic pressure with balanced salt  solution.  No wound leaks were noted. Cefuroxime  0.1 ml of a 10mg /ml solution was injected into the anterior chamber for a dose of 1 mg of intracameral antibiotic at the completion of the case.   Timolol  and Brimonidine  drops were applied to the eye.  The patient was taken to the recovery room in stable condition without complications of anesthesia or surgery.   Uriel Dowding 06/12/2024, 7:56 AM

## 2024-06-12 NOTE — Transfer of Care (Signed)
 Immediate Anesthesia Transfer of Care Note  Patient: Susan [name]  Procedure(s) Performed: PHACOEMULSIFICATION, CATARACT, WITH IOL INSERTION 6.37 00:39.2 (Right: Eye)  Patient Location: PACU  Anesthesia Type: MAC  Level of Consciousness: awake, alert  and patient cooperative  Airway and Oxygen Therapy: Patient Spontanous Breathing and Patient connected to supplemental oxygen  Post-op Assessment: Post-op Vital signs reviewed, Patient's Cardiovascular Status Stable, Respiratory Function Stable, Patent Airway and No signs of Nausea or vomiting  Post-op Vital Signs: Reviewed and stable  Complications: No notable events documented.

## 2024-06-12 NOTE — H&P (Signed)
 [facility]   Primary Care Physician:  Calista Catching, FNP Ophthalmologist: Dr. Annell Kidney  Pre-Procedure History & Physical: HPI:  Susan [name] is a 73 y.o. female here for ophthalmic surgery.   Past Medical History:  Diagnosis Date   Actinic keratosis    Arthritis    Atherosclerosis of aorta (HCC)    Atrial tachycardia (HCC)    Basal cell carcinoma 05/26/2016   R chest parasternal   Basal cell carcinoma 01/21/2015   L nose supratip    Breast cancer (HCC)    Left DCIS   Cancer (HCC)    Complication of anesthesia    Dysrhythmia    PSVT on metoprolol    Essential tremor    Family history of breast cancer    Gastric reflux 90's   GERD (gastroesophageal reflux disease)    Grade II diastolic dysfunction    Headache    H/O MIGRAINES   Hiatal hernia 2003   History of Coronary CT for Calcium Scoring    a. 11/2018 CT Cor Ca2+ = Zero.   History of ductal carcinoma in situ (DCIS) of left breast    Hypertension    Lichen sclerosus et atrophicus    Mitral regurgitation    a. 03/2017 Echo: EF 55-60%, no nrwma, Mod MR; b. 03/2018 Echo: EF 60-65%, no rwma. Gr2 DD. Mild MV prolapse of ant leaflet w/ mild to mod mR. Nl RV fxn. Mild to mod TR.   Moderate mitral regurgitation by prior echocardiogram    Mood disorder [facility])    Personal history of radiation therapy 2019   LEFT lumpectomy   PONV (postoperative nausea and vomiting)    N/V   PSVT (paroxysmal supraventricular tachycardia) (HCC)    a. 03/2017 Event Monitor: Avg HR 66, 10 episodes of SVT up to 42.8 secs, fastest 148 bpm. 8 beats WCT (179 bpm) - VT vs SVT w/ aberrancy. Rare PACs (2.6% total beats).   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   R prox lat bicep   Tremor of both hands     Past Surgical History:  Procedure Laterality Date   AUGMENTATION MAMMAPLASTY Bilateral 01/17/2020   BREAST BIOPSY Left 06/13/2018   Affirm Bx- coil clip   atypical sclerosing and DCIS   BREAST ENHANCEMENT SURGERY  Bilateral 01/17/2020   Procedure: MAMMOPLASTY AUGMENTATION WITH PROSTHESIS (BREAST);  Surgeon: Barb Bonito, MD;  Location: [facility];  Service: Plastics;  Laterality: Bilateral;  2.5 hours for total case   BREAST EXCISIONAL BIOPSY Right 2000   benign   BREAST LUMPECTOMY WITH NEEDLE LOCALIZATION Left 06/26/2018   Procedure: BREAST LUMPECTOMY WITH NEEDLE LOCALIZATION;  Surgeon: Alben Alma, MD;  Location: [facility];  Service: General;  Laterality: Left;   CATARACT EXTRACTION W/PHACO Left 05/29/2024   Procedure: PHACOEMULSIFICATION, CATARACT, WITH IOL INSERTION 5.56 00:34.3;  Surgeon: Annell Kidney, MD;  Location: [facility];  Service: Ophthalmology;  Laterality: Left;   CHOLECYSTECTOMY     OVARIAN CYST SURGERY     SCAR REVISION Left 01/17/2020   Procedure: POSSIBLE SCAR REVISION TO LEFT BREAST;  Surgeon: Barb Bonito, MD;  Location: [facility];  Service: Plastics;  Laterality: Left;   SKIN FULL THICKNESS GRAFT  01/17/2020   Procedure: SKIN GRAFT FULL THICKNESS;  Surgeon: Barb Bonito, MD;  Location: [facility];  Service: Plastics;;   TONSILLECTOMY     age 47   UPPER GI ENDOSCOPY  2003    Prior to Admission medications  Medication Sig Start Date End Date Taking? Authorizing Provider  ALPRAZolam  (XANAX ) 0.5 MG tablet Take 0.5-1 tablets (0.25-0.5 mg total) by mouth daily as needed. for sleep 03/04/24  Yes Calista Catching, FNP  calcium citrate-vitamin D  500-500 MG-UNIT chewable tablet Chew 1 tablet by mouth 2 (two) times daily.   Yes [provider]  cholecalciferol (VITAMIN D ) 1000 units tablet Take 1,000 Units by mouth daily.   Yes [provider]  citalopram  (CELEXA ) 20 MG tablet Take 1 tablet (20 mg total) by mouth daily. 09/14/23  Yes Arnett, Hanley Lew, FNP  clobetasol  ointment (TEMOVATE ) 0.05 % APPLY TO AFFECTED AREA EVERY NIGHT FOR 4 WEEKS, THEN EVERY OTHER DAY FOR 4 WEEKS AND THEN TWICE A WEEK FOR  4 WEEKS OR UNTIL RESOLUTION. 10/12/23  Yes Constant, Peggy, MD  cyclobenzaprine  (FLEXERIL ) 5 MG tablet Take 1-2 tablets (5-10 mg total) by mouth at bedtime as needed for muscle spasms. 06/13/23  Yes Calista Catching, FNP  famotidine (PEPCID) 20 MG tablet Take 20 mg by mouth as needed for heartburn or indigestion.   Yes [provider]  fluticasone (FLONASE) 50 MCG/ACT nasal spray Place 2 sprays into both nostrils as needed.    Yes [provider]  Ivermectin  1 % CREA Apply 1 Application topically at bedtime. qhs to face for Rosacea 09/13/23  Yes Elta Halter, MD  meloxicam  (MOBIC ) 15 MG tablet Take 1 tablet (15 mg total) by mouth at bedtime. Use sparingly. Take with food. 01/04/24 07/02/24 Yes Arnett, Hanley Lew, FNP  metoprolol  succinate (TOPROL -XL) 25 MG 24 hr tablet TAKE 1/2 TABLET BY MOUTH DAILY 08/17/23  Yes Gollan, Timothy J, MD  Multiple Vitamin (MULTIVITAMIN) tablet Take 1 tablet by mouth daily.   Yes [provider]  psyllium (REGULOID) 0.52 g capsule Take 0.52 g by mouth 3 (three) times daily.    Yes [provider]  SIMETHICONE-80 PO Take 80 mg by mouth as needed (gas).    Yes [provider]  topiramate  (TOPAMAX ) 25 MG tablet Take 1 tablet (25 mg total) by mouth daily. 03/27/24  Yes Calista Catching, FNP    Allergies as of 05/14/2024   (No Known Allergies)    Family History  Problem Relation Age of Onset   Hyperlipidemia Mother    Hypertension Mother    COPD Mother    Heart failure Mother        deceased 84   Stroke Father 33   Hypertension Father    Hypertension Sister    Hypertension Brother    Healthy Brother    Melanoma Paternal Uncle    Breast cancer Cousin 21       daughter of paternal aunt; currently 103   Breast cancer Cousin 76       daughter of paternal uncle; currently 32   Breast cancer Cousin 38       daughter of maternal uncle; deceased 56   Breast cancer Cousin 4       daughter of same maternal uncle;  currently 89   Testicular cancer Other 72       sister's son; currently 42   Coronary artery disease Neg Hx     Social History   Socioeconomic History   Marital status: Married    Spouse name: Richard   Number of children: 2   Years of education: College   Highest education level: Bachelor's degree (e.g., BA, AB, BS)  Occupational History   Occupation: retired  Tobacco Use  Smoking status: Never   Smokeless tobacco: Never  Vaping Use   Vaping status: Never Used  Substance and Sexual Activity   Alcohol use: Yes    Alcohol/week: 7.0 standard drinks of alcohol    Types: 7 Glasses of wine per week    Comment: daily WINE   Drug use: No   Sexual activity: Not Currently    Birth control/protection: Post-menopausal  Other Topics Concern   Not on file  Social History Narrative   Married Engineer, 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      From: the area, [place]   Living: with husband Rich Champ [phone])   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      Family: 2 step children - Daivd Dub and [place] - 3 grandchildren, good relationships      Enjoys: garden, flowers      Exercise: walking, walking the dog IT sales professional doodle) - daily, average 6 mile/day   Diet: fairly good       Safety   Seat belts: Yes    Guns: Yes  and secure   Safe in relationships: Yes    Social Drivers of Corporate investment banker Strain: Low Risk  (03/13/2024)   Overall Financial Resource Strain (CARDIA)    Difficulty of Paying Living Expenses: Not hard at all  Food Insecurity: No Food Insecurity (03/13/2024)   Hunger Vital Sign    Worried About Running Out of Food in the Last Year: Never true    Ran Out of Food in the Last Year: Never true  Transportation Needs: No Transportation Needs (03/13/2024)   PRAPARE - Administrator, Civil Service (Medical): No    Lack of Transportation (Non-Medical): No  Physical Activity: Sufficiently Active (03/13/2024)   Exercise Vital Sign    Days of Exercise per Week: 6 days    Minutes  of Exercise per Session: 40 min  Stress: No Stress Concern Present (03/13/2024)   [employer] of Occupational Health - Occupational Stress Questionnaire    Feeling of Stress : Not at all  Social Connections: Moderately Integrated (03/13/2024)   Social Connection and Isolation Panel    Frequency of Communication with Friends and Family: More than three times a week    Frequency of Social Gatherings with Friends and Family: More than three times a week    Attends Religious Services: Never    Database administrator or Organizations: Yes    Attends Engineer, structural: More than 4 times per year    Marital Status: Married  Catering manager Violence: Not At Risk (03/13/2024)   Humiliation, Afraid, Rape, and Kick questionnaire    Fear of Current or Ex-Partner: No    Emotionally Abused: No    Physically Abused: No    Sexually Abused: No    Review of Systems: See HPI, otherwise negative ROS  Physical Exam: BP 134/62   Temp (!) 97.4 F (36.3 C) (Temporal)   Resp 17   Ht 5' 7.01 (1.702 m)   Wt 59.8 kg   SpO2 95%   BMI 20.64 kg/m  General:   Alert,  pleasant and cooperative in NAD Head:  Normocephalic and atraumatic. Lungs:  Clear to auscultation.    Heart:  Regular rate and rhythm.   Impression/Plan: Susan [name] is here for ophthalmic surgery.  Risks, benefits, limitations, and alternatives regarding ophthalmic surgery have been reviewed with the patient.  Questions have been answered.  All parties agreeable.   Annell Kidney, MD  06/12/2024, 7:34 AM

## 2024-06-12 NOTE — Anesthesia Preprocedure Evaluation (Addendum)
 Anesthesia Evaluation  Patient identified by MRN, date of birth, ID band Patient awake    Reviewed: Allergy & Precautions, H&P , NPO status , Patient's Chart, lab work & pertinent test results  History of Anesthesia Complications (+) PONV and history of anesthetic complications  Airway Mallampati: II  TM Distance: >3 FB Neck ROM: full    Dental no notable dental hx.    Pulmonary neg pulmonary ROS   Pulmonary exam normal        Cardiovascular hypertension, + CAD  Normal cardiovascular exam+ dysrhythmias (Grade II diastolic dysfunction) + Valvular Problems/Murmurs MR      Neuro/Psych negative neurological ROS  negative psych ROS   GI/Hepatic Neg liver ROS, hiatal hernia,GERD  ,,  Endo/Other  Hypothyroidism    Renal/GU      Musculoskeletal  (+) Arthritis ,    Abdominal   Peds  Hematology negative hematology ROS (+)   Anesthesia Other Findings Past Medical History: No date: Actinic keratosis No date: Arthritis No date: Atherosclerosis of aorta (HCC) No date: Atrial tachycardia (HCC) 05/26/2016: Basal cell carcinoma     Comment:  R chest parasternal 01/21/2015: Basal cell carcinoma     Comment:  L nose supratip  No date: Breast cancer (HCC)     Comment:  Left DCIS No date: Cancer (HCC) No date: Complication of anesthesia No date: Dysrhythmia     Comment:  PSVT on metoprolol  No date: Essential tremor No date: Family history of breast cancer 90's: Gastric reflux No date: GERD (gastroesophageal reflux disease) No date: Grade II diastolic dysfunction No date: Headache     Comment:  H/O MIGRAINES 2003: Hiatal hernia No date: History of Coronary CT for Calcium Scoring     Comment:  a. 11/2018 CT Cor Ca2+ = Zero. No date: History of ductal carcinoma in situ (DCIS) of left breast No date: Hypertension No date: Lichen sclerosus et atrophicus No date: Mitral regurgitation     Comment:  a. 03/2017 Echo: EF  55-60%, no nrwma, Mod MR; b. 03/2018               Echo: EF 60-65%, no rwma. Gr2 DD. Mild MV prolapse of ant              leaflet w/ mild to mod mR. Nl RV fxn. Mild to mod TR. No date: Moderate mitral regurgitation by prior echocardiogram No date: Mood disorder (HCC) 2019: Personal history of radiation therapy     Comment:  LEFT lumpectomy No date: PONV (postoperative nausea and vomiting)     Comment:  N/V No date: PSVT (paroxysmal supraventricular tachycardia) (HCC)     Comment:  a. 03/2017 Event Monitor: Avg HR 66, 10 episodes of SVT               up to 42.8 secs, fastest 148 bpm. 8 beats WCT (179 bpm) -              VT vs SVT w/ aberrancy. Rare PACs (2.6% total beats). 10/31/2018: Squamous cell carcinoma of skin     Comment:  R prox lat bicep No date: Tremor of both hands  Past Surgical History: 01/17/2020: AUGMENTATION MAMMAPLASTY; Bilateral 06/13/2018: BREAST BIOPSY; Left     Comment:  Affirm Bx- coil clip   atypical sclerosing and DCIS 01/17/2020: BREAST ENHANCEMENT SURGERY; Bilateral     Comment:  Procedure: MAMMOPLASTY AUGMENTATION WITH PROSTHESIS               (BREAST);  Surgeon: Luetta Sago  S, MD;  Location: [facility];  Service: Plastics;  Laterality:               Bilateral;  2.5 hours for total case 2000: BREAST EXCISIONAL BIOPSY; Right     Comment:  benign 06/26/2018: BREAST LUMPECTOMY WITH NEEDLE LOCALIZATION; Left     Comment:  Procedure: BREAST LUMPECTOMY WITH NEEDLE LOCALIZATION;                Surgeon: Alben Alma, MD;  Location: [facility];                Service: General;  Laterality: Left; 05/29/2024: CATARACT EXTRACTION W/PHACO; Left     Comment:  Procedure: PHACOEMULSIFICATION, CATARACT, WITH IOL               INSERTION 5.56 00:34.3;  Surgeon: Annell Kidney,               MD;  Location: [facility];  Service:               Ophthalmology;  Laterality: Left; No date: CHOLECYSTECTOMY No date: OVARIAN CYST  SURGERY 01/17/2020: SCAR REVISION; Left     Comment:  Procedure: POSSIBLE SCAR REVISION TO LEFT BREAST;                Surgeon: Barb Bonito, MD;  Location: [place] [facility];  Service: Plastics;  Laterality: Left; 01/17/2020: SKIN FULL THICKNESS GRAFT     Comment:  Procedure: SKIN GRAFT FULL THICKNESS;  Surgeon: Barb Bonito, MD;  Location: [facility];                Service: Plastics;; No date: TONSILLECTOMY     Comment:  age 73 2003: UPPER GI ENDOSCOPY  BMI    Body Mass Index: 20.64 kg/m      Reproductive/Obstetrics negative OB ROS                              Anesthesia Physical Anesthesia Plan  ASA: 2  Anesthesia Plan: MAC   Post-op Pain Management:    Induction: Intravenous  PONV Risk Score and Plan:   Airway Management Planned:   Additional Equipment:   Intra-op Plan:   Post-operative Plan:   Informed Consent: I have reviewed the patients History and Physical, chart, labs and discussed the procedure including the risks, benefits and alternatives for the proposed anesthesia with the patient or authorized representative who has indicated his/her understanding and acceptance.       Plan Discussed with: Anesthesiologist, CRNA and Surgeon  Anesthesia Plan Comments:          Anesthesia Quick Evaluation

## 2024-06-12 NOTE — Anesthesia Postprocedure Evaluation (Signed)
 Anesthesia Post Note  Patient: Susan [name]  Procedure(s) Performed: PHACOEMULSIFICATION, CATARACT, WITH IOL INSERTION 6.37 00:39.2 (Right: Eye)  Patient location during evaluation: PACU Anesthesia Type: MAC Level of consciousness: awake and alert Pain management: pain level controlled Vital Signs Assessment: post-procedure vital signs reviewed and stable Respiratory status: spontaneous breathing, nonlabored ventilation and respiratory function stable Cardiovascular status: stable and blood pressure returned to baseline Postop Assessment: no apparent nausea or vomiting Anesthetic complications: no   No notable events documented.   Last Vitals:  Vitals:   06/12/24 0802 06/12/24 0803  BP: 95/67   Pulse: 68 (!) 56  Resp: 13 14  Temp:    SpO2: 92% 92%    Last Pain:  Vitals:   06/12/24 0802  TempSrc:   PainSc: 0-No pain                 Baltazar Bonier

## 2024-06-13 ENCOUNTER — Encounter: Payer: Self-pay | Admitting: Ophthalmology

## 2024-06-24 ENCOUNTER — Ambulatory Visit
Admission: RE | Admit: 2024-06-24 | Discharge: 2024-06-24 | Disposition: A | Discharge status: Home / Self Care | Source: Ambulatory Visit | Attending: Oncology | Admitting: Oncology

## 2024-06-24 DIAGNOSIS — Z1231 Encounter for screening mammogram for malignant neoplasm of breast: Secondary | ICD-10-CM | POA: Diagnosis not present

## 2024-07-08 ENCOUNTER — Ambulatory Visit
Admission: RE | Admit: 2024-07-08 | Discharge: 2024-07-08 | Disposition: A | Discharge status: Home / Self Care | Payer: PPO | Source: Ambulatory Visit | Attending: Obstetrics & Gynecology | Admitting: Obstetrics & Gynecology

## 2024-07-08 DIAGNOSIS — N83201 Unspecified ovarian cyst, right side: Secondary | ICD-10-CM | POA: Diagnosis not present

## 2024-07-08 DIAGNOSIS — R9389 Abnormal findings on diagnostic imaging of other specified body structures: Secondary | ICD-10-CM | POA: Diagnosis not present

## 2024-07-08 DIAGNOSIS — D259 Leiomyoma of uterus, unspecified: Secondary | ICD-10-CM | POA: Diagnosis not present

## 2024-07-11 ENCOUNTER — Ambulatory Visit: Payer: Self-pay | Admitting: Obstetrics & Gynecology

## 2024-07-11 NOTE — Telephone Encounter (Signed)
 Called pt to discuss US  results and recommendations. Pt okay to schedule an EMB.

## 2024-07-11 NOTE — Progress Notes (Signed)
 Unchanged thickened and heterogenous endometrium for over one year, no further concerning characteristics. However, the results from benign endometrial biopsy done 07/04/23 are no longer considered valid after many months, repeat endometrial sampling is recommended.  Patient can be scheduled for office endometrial biopsy or come in to discuss further evaluation with Hysteroscopy, Dilation and Curettage for continued reassurance about the benign nature of this endometrium.  Please call to inform patient of results and recommendations.   Gloris Hugger, MD

## 2024-07-11 NOTE — Telephone Encounter (Signed)
-----   Message from Otsego Anyanwu sent at 07/11/2024  8:15 AM EDT ----- Unchanged thickened and heterogenous endometrium for over one year, no further concerning characteristics. However, the results from benign endometrial biopsy done 07/04/23 are no longer considered valid after many months, repeat endometrial sampling is recommended.  Patient can be scheduled for office  endometrial biopsy or come in to discuss further evaluation with Hysteroscopy, Dilation and Curettage for continued reassurance about the benign nature of this endometrium.  Please call to inform  patient of results and recommendations.   Gloris Hugger, MD ----- Message ----- From: Interface, Rad Results In Sent: 07/10/2024   6:08 PM EDT To: Gloris DELENA Hugger, MD

## 2024-07-29 DIAGNOSIS — L814 Other melanin hyperpigmentation: Secondary | ICD-10-CM | POA: Diagnosis not present

## 2024-07-29 DIAGNOSIS — L578 Other skin changes due to chronic exposure to nonionizing radiation: Secondary | ICD-10-CM | POA: Diagnosis not present

## 2024-07-29 DIAGNOSIS — L570 Actinic keratosis: Secondary | ICD-10-CM | POA: Diagnosis not present

## 2024-07-29 DIAGNOSIS — L821 Other seborrheic keratosis: Secondary | ICD-10-CM | POA: Diagnosis not present

## 2024-08-21 ENCOUNTER — Ambulatory Visit: Admitting: Obstetrics & Gynecology

## 2024-08-21 ENCOUNTER — Encounter: Payer: Self-pay | Admitting: Obstetrics & Gynecology

## 2024-08-21 ENCOUNTER — Other Ambulatory Visit (HOSPITAL_COMMUNITY)
Admission: RE | Admit: 2024-08-21 | Discharge: 2024-08-21 | Disposition: A | Discharge status: Home / Self Care | Source: Ambulatory Visit | Attending: Obstetrics & Gynecology | Admitting: Obstetrics & Gynecology

## 2024-08-21 VITALS — BP 131/75 | HR 59 | Wt 134.0 lb

## 2024-08-21 DIAGNOSIS — R9389 Abnormal findings on diagnostic imaging of other specified body structures: Secondary | ICD-10-CM

## 2024-08-21 DIAGNOSIS — N858 Other specified noninflammatory disorders of uterus: Secondary | ICD-10-CM | POA: Diagnosis not present

## 2024-08-21 NOTE — Progress Notes (Signed)
      GYNECOLOGY OFFICE PROCEDURE NOTE   Susan [name] is a 73 y.o. G0P0000 here for endometrial biopsy for heterogenous endometrium seen ultrasound.  Had this last year too, had benign endometrial biopsy on 07/04/2023.  No postmenopausal bleeding. Today, she reports no concerning symptoms.    ENDOMETRIAL BIOPSY     The indications for endometrial biopsy were reviewed.   Risks of the biopsy including cramping, bleeding, infection, uterine perforation, inadequate specimen and need for additional procedures were discussed. Offered alternative of hysteroscopy, dilation and curettage in OR. The patient states she understands the R/B/I/A and agrees to undergo procedure today.  Consent was signed. Time out was performed. Chaperone was present during entire procedure.   Patient was positioned in dorsal lithotomy position. A vaginal speculum was placed.  The cervix was visualized and was prepped with Betadine. A paracervical block using 10 ml of 2% Lidocaine  was administered.  A single-toothed tenaculum was placed on the anterior lip of the cervix to stabilize it. Dilators were used to widen the cervical canal. The 3 mm pipelle was introduced into the endometrial cavity without difficulty to a depth of 7 cm, and a scant amount of tissue was obtained after 3 passes and sent to pathology. The endometrium seemed atrophic on palpation with the pipelle. The instruments were removed from the patient's vagina. Minimal bleeding from the cervix was noted. The patient tolerated the procedure well. Routine post-procedure instructions were given to the patient.     Patient was given post procedure instructions.  Will follow up pathology and manage accordingly; patient will be contacted with results and recommendations.  Routine preventative health maintenance measures emphasized.       GLORIS HUGGER, MD, FACOG Obstetrician & Gynecologist, [facility] for [employer], [facility]

## 2024-08-21 NOTE — Patient Instructions (Signed)
 ENDOMETRIAL BIOPSY POST-PROCEDURE INSTRUCTIONS  You may take Ibuprofen, Aleve or Tylenol for pain if needed.  Cramping should resolve within in 24 hours.  You may have a small amount of spotting.  You should wear a mini pad for the next few days.  You may have intercourse after 24 hours.  You need to call if you have any pelvic pain, fever, heavy bleeding or foul smelling vaginal discharge.  Shower or bathe as normal  6. We will call you within one week with results or we will discuss   the results at your follow-up appointment if needed.

## 2024-08-23 LAB — SURGICAL PATHOLOGY

## 2024-08-24 ENCOUNTER — Ambulatory Visit: Payer: Self-pay | Admitting: Obstetrics & Gynecology

## 2024-09-18 ENCOUNTER — Encounter: Payer: Self-pay | Admitting: Dermatology

## 2024-09-18 ENCOUNTER — Ambulatory Visit: Payer: PPO | Admitting: Dermatology

## 2024-09-18 DIAGNOSIS — L719 Rosacea, unspecified: Secondary | ICD-10-CM | POA: Diagnosis not present

## 2024-09-18 DIAGNOSIS — L821 Other seborrheic keratosis: Secondary | ICD-10-CM | POA: Diagnosis not present

## 2024-09-18 DIAGNOSIS — D1801 Hemangioma of skin and subcutaneous tissue: Secondary | ICD-10-CM | POA: Diagnosis not present

## 2024-09-18 DIAGNOSIS — L570 Actinic keratosis: Secondary | ICD-10-CM

## 2024-09-18 DIAGNOSIS — I781 Nevus, non-neoplastic: Secondary | ICD-10-CM | POA: Diagnosis not present

## 2024-09-18 DIAGNOSIS — L820 Inflamed seborrheic keratosis: Secondary | ICD-10-CM | POA: Diagnosis not present

## 2024-09-18 DIAGNOSIS — Z85828 Personal history of other malignant neoplasm of skin: Secondary | ICD-10-CM

## 2024-09-18 DIAGNOSIS — Z8589 Personal history of malignant neoplasm of other organs and systems: Secondary | ICD-10-CM

## 2024-09-18 DIAGNOSIS — W908XXA Exposure to other nonionizing radiation, initial encounter: Secondary | ICD-10-CM

## 2024-09-18 DIAGNOSIS — L814 Other melanin hyperpigmentation: Secondary | ICD-10-CM

## 2024-09-18 DIAGNOSIS — Z7189 Other specified counseling: Secondary | ICD-10-CM

## 2024-09-18 DIAGNOSIS — Z1283 Encounter for screening for malignant neoplasm of skin: Secondary | ICD-10-CM

## 2024-09-18 DIAGNOSIS — D692 Other nonthrombocytopenic purpura: Secondary | ICD-10-CM | POA: Diagnosis not present

## 2024-09-18 DIAGNOSIS — Z79899 Other long term (current) drug therapy: Secondary | ICD-10-CM

## 2024-09-18 DIAGNOSIS — D229 Melanocytic nevi, unspecified: Secondary | ICD-10-CM

## 2024-09-18 DIAGNOSIS — L578 Other skin changes due to chronic exposure to nonionizing radiation: Secondary | ICD-10-CM

## 2024-09-18 DIAGNOSIS — Z872 Personal history of diseases of the skin and subcutaneous tissue: Secondary | ICD-10-CM

## 2024-09-18 MED ORDER — IVERMECTIN 1 % EX CREA: 1.0000 | TOPICAL_CREAM | Freq: Every day | CUTANEOUS | 11 refills | Status: AC

## 2024-09-18 NOTE — Progress Notes (Signed)
 Follow-Up Visit   Subjective  Susan [name] is a 73 y.o. female who presents for the following: Skin Cancer Screening and Full Body Skin Exam, hx of BCC, SCC, Aks, Rosacea, SM Triple cream at bedtime. Check spot at left lower cheek/chin, R cheek, nose, right forehead.  [facility] on-site dermatology checked spot on nose. Bx showed AK. Used Fluorouracil 5% cream twice a day for 2 weeks.   The patient presents for Total-Body Skin Exam (TBSE) for skin cancer screening and mole check. The patient has spots, moles and lesions to be evaluated, some may be new or changing and the patient may have concern these could be cancer.  The following portions of the chart were reviewed this encounter and updated as appropriate: medications, allergies, medical history  Review of Systems:  No other skin or systemic complaints except as noted in HPI or Assessment and Plan.  Objective  Well appearing patient in no apparent distress; mood and affect are within normal limits.  A full examination was performed including scalp, head, eyes, ears, nose, lips, neck, chest, axillae, abdomen, back, buttocks, bilateral upper extremities, bilateral lower extremities, hands, feet, fingers, toes, fingernails, and toenails. All findings within normal limits unless otherwise noted below.   Relevant physical exam findings are noted in the Assessment and Plan.  Right Forehead x2, R cheek x1, L mandible x1, shoulders x15, R pretibial x1 (20) Erythematous keratotic or waxy stuck-on papule or plaque. Right Nasal Sidewall x1 Erythematous thin papules/macules with gritty scale.  Dorsum of Nose Clear today, no sign of recurrence.   Assessment & Plan   SKIN CANCER SCREENING PERFORMED TODAY.  ACTINIC DAMAGE - Chronic condition, secondary to cumulative UV/sun exposure - diffuse scaly erythematous macules with underlying dyspigmentation - Recommend daily broad spectrum sunscreen SPF 30+ to sun-exposed areas, reapply every  2 hours as needed.  - Staying in the shade or wearing long sleeves, sun glasses (UVA+UVB protection) and wide brim hats (4-inch brim around the entire circumference of the hat) are also recommended for sun protection.  - Call for new or changing lesions.  LENTIGINES, SEBORRHEIC KERATOSES, HEMANGIOMAS - Benign normal skin lesions - Benign-appearing - Call for any changes  MELANOCYTIC NEVI - Tan-brown and/or pink-flesh-colored symmetric macules and papules - Benign appearing on exam today - Observation - Call clinic for new or changing moles - Recommend daily use of broad spectrum spf 30+ sunscreen to sun-exposed areas.   HISTORY OF BASAL CELL CARCINOMA OF THE SKIN - No evidence of recurrence today - Recommend regular full body skin exams - Recommend daily broad spectrum sunscreen SPF 30+ to sun-exposed areas, reapply every 2 hours as needed.  - Call if any new or changing lesions are noted between office visits  - R chest parasternal, L nose supratip  HISTORY OF SQUAMOUS CELL CARCINOMA OF THE SKIN - No evidence of recurrence today - No lymphadenopathy - Recommend regular full body skin exams - Recommend daily broad spectrum sunscreen SPF 30+ to sun-exposed areas, reapply every 2 hours as needed.  - Call if any new or changing lesions are noted between office visits - R prox lat bicep   Hx of Breast Cancer - L breast - Clear to visual exam - No lymphadenopathy  Spider Veins legs - Dilated blue, purple or red veins at the lower extremities - Reassured - Smaller vessels can be treated by sclerotherapy (a procedure to inject a medicine into the veins to make them disappear) if desired, but the treatment is not  covered by insurance. Larger vessels may be covered if symptomatic and we would refer to vascular surgeon if treatment desired.   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 Currently well-controlled.  Rosacea  is a chronic progressive skin condition usually affecting the face of adults, causing redness and/or acne bumps. It is treatable but not curable. It sometimes affects the eyes (ocular rosacea) as well. It may respond to topical and/or systemic medication and can flare with stress, sun exposure, alcohol, exercise, topical steroids (including hydrocortisone/cortisone 10) and some foods.  Daily application of broad spectrum spf 30+ sunscreen to face is recommended to reduce flares.  Patient denies grittiness of the eyes  Treatment Plan Cont SM Triple cream qhs to face.  Counseling for BBL / IPL / Laser and Coordination of Care Discussed the treatment option of Broad Band Light (BBL) /Intense Pulsed Light (IPL)/ Laser for skin discoloration, including brown spots and redness.  Typically we recommend at least 1-3 treatment sessions about 5-8 weeks apart for best results.  Cannot have tanned skin when BBL performed, and regular use of sunscreen/photoprotection is advised after the procedure to help maintain results. The patient's condition may also require maintenance treatments in the future.  The fee for BBL / laser treatments is $350 per treatment session for the whole face.  A fee can be quoted for other parts of the body.  Insurance typically does not pay for BBL/laser treatments and therefore the fee is an out-of-pocket cost. Recommend prophylactic valtrex treatment. Once scheduled for procedure, will send Rx in prior to patient's appointment.   Long term medication management.  Patient is using long term (months to years) prescription medication  to control their dermatologic condition.  These medications require periodic monitoring to evaluate for efficacy and side effects and may require periodic laboratory monitoring.    INFLAMED SEBORRHEIC KERATOSIS (20) Right Forehead x2, R cheek x1, L mandible x1, shoulders x15, R pretibial x1 (20) Symptomatic, irritating, patient would like  treated. Destruction of lesion - Right Forehead x2, R cheek x1, L mandible x1, shoulders x15, R pretibial x1 (20) Complexity: simple   Destruction method: cryotherapy   Informed consent: discussed and consent obtained   Timeout:  patient name, date of birth, surgical site, and procedure verified Lesion destroyed using liquid nitrogen: Yes   Region frozen until ice ball extended beyond lesion: Yes   Outcome: patient tolerated procedure well with no complications   Post-procedure details: wound care instructions given   Additional details:  Prior to procedure, discussed risks of blister formation, small wound, skin dyspigmentation, or rare scar following cryotherapy. Recommend Vaseline ointment to treated areas while healing.   AK (ACTINIC KERATOSIS) Right Nasal Sidewall x1 Actinic keratoses are precancerous spots that appear secondary to cumulative UV radiation exposure/sun exposure over time. They are chronic with expected duration over 1 year. A portion of actinic keratoses will progress to squamous cell carcinoma of the skin. It is not possible to reliably predict which spots will progress to skin cancer and so treatment is recommended to prevent development of skin cancer.  Recommend daily broad spectrum sunscreen SPF 30+ to sun-exposed areas, reapply every 2 hours as needed.  Recommend staying in the shade or wearing long sleeves, sun glasses (UVA+UVB protection) and wide brim hats (4-inch brim around the entire circumference of the hat). Call for new or changing lesions. Destruction of lesion - Right Nasal Sidewall x1 Complexity: simple   Destruction method: cryotherapy  Informed consent: discussed and consent obtained   Timeout:  patient name, date of birth, surgical site, and procedure verified Lesion destroyed using liquid nitrogen: Yes   Region frozen until ice ball extended beyond lesion: Yes   Outcome: patient tolerated procedure well with no complications   Post-procedure  details: wound care instructions given   Additional details:  Prior to procedure, discussed risks of blister formation, small wound, skin dyspigmentation, or rare scar following cryotherapy. Recommend Vaseline ointment to treated areas while healing.   HISTORY OF ACTINIC KERATOSIS Dorsum of Nose Observe for recurrence. Biopsy proven AK - Biopsy by Derm [facility] at [facility].  Treated with topical chemo.  Purpura - Chronic; persistent and recurrent.  Treatable, but not curable. - Violaceous macules and patches - Benign - Related to trauma, age, sun damage and/or use of blood thinners, chronic use of topical and/or oral steroids - Observe - Can use OTC arnica containing moisturizer such as Dermend Bruise Formula if desired - Call for worsening or other concerns  - arms  Return in about 1 year (around 09/18/2025) for TBSE, HxBCC, HxSCC, BBL this winter nose, cheeks.  I, Jill Parcell, CMA, am acting as scribe for Alm Rhyme, MD.    Documentation: I have reviewed the above documentation for accuracy and completeness, and I agree with the above.  Alm Rhyme, MD

## 2024-09-18 NOTE — Patient Instructions (Addendum)
 Counseling for BBL / IPL / Laser and Coordination of Care Discussed the treatment option of Broad Band Light (BBL) /Intense Pulsed Light (IPL)/ Laser for skin discoloration, including brown spots and redness.  Typically we recommend at least 1-3 treatment sessions about 5-8 weeks apart for best results.  Cannot have tanned skin when BBL performed, and regular use of sunscreen/photoprotection is advised after the procedure to help maintain results. The patient's condition may also require maintenance treatments in the future.  The fee for BBL / laser treatments is $350 per treatment session for the whole face.  A fee can be quoted for other parts of the body.  Insurance typically does not pay for BBL/laser treatments and therefore the fee is an out-of-pocket cost. Recommend prophylactic valtrex treatment. Once scheduled for procedure, will send Rx in prior to patient's appointment.    Cryotherapy Aftercare  Wash gently with soap and water everyday.   Apply Vaseline Jelly daily until healed.   Melanoma ABCDEs  Melanoma is the most dangerous type of skin cancer, and is the leading cause of death from skin disease.  You are more likely to develop melanoma if you: Have light-colored skin, light-colored eyes, or red or blond hair Spend a lot of time in the sun Tan regularly, either outdoors or in a tanning bed Have had blistering sunburns, especially during childhood Have a close family member who has had a melanoma Have atypical moles or large birthmarks  Early detection of melanoma is key since treatment is typically straightforward and cure rates are extremely high if we catch it early.   The first sign of melanoma is often a change in a mole or a new dark spot.  The ABCDE system is a way of remembering the signs of melanoma.  A for asymmetry:  The two halves do not match. B for border:  The edges of the growth are irregular. C for color:  A mixture of colors are present instead of an even  brown color. D for diameter:  Melanomas are usually (but not always) greater than 6mm - the size of a pencil eraser. E for evolution:  The spot keeps changing in size, shape, and color.  Please check your skin once per month between visits. You can use a small mirror in front and a large mirror behind you to keep an eye on the back side or your body.   If you see any new or changing lesions before your next follow-up, please call to schedule a visit.  Please continue daily skin protection including broad spectrum sunscreen SPF 30+ to sun-exposed areas, reapplying every 2 hours as needed when you're outdoors.   Staying in the shade or wearing long sleeves, sun glasses (UVA+UVB protection) and wide brim hats (4-inch brim around the entire circumference of the hat) are also recommended for sun protection.     Due to recent changes in healthcare laws, you may see results of your pathology and/or laboratory studies on MyChart before the doctors have had a chance to review them. We understand that in some cases there may be results that are confusing or concerning to you. Please understand that not all results are received at the same time and often the doctors may need to interpret multiple results in order to provide you with the best plan of care or course of treatment. Therefore, we ask that you please give us  2 business days to thoroughly review all your results before contacting the office for clarification. Should we  see a critical lab result, you will be contacted sooner.   If You Need Anything After Your Visit  If you have any questions or concerns for your doctor, please call our main line at [phone] and press option 4 to reach your doctor's medical assistant. If no one answers, please leave a voicemail as directed and we will return your call as soon as possible. Messages left after 4 pm will be answered the following business day.   You may also send us  a message via MyChart. We  typically respond to MyChart messages within 1-2 business days.  For prescription refills, please ask your pharmacy to contact our office. Our fax number is [phone].  If you have an urgent issue when the clinic is closed that cannot wait until the next business day, you can page your doctor at the number below.    Please note that while we do our best to be available for urgent issues outside of office hours, we are not available 24/7.   If you have an urgent issue and are unable to reach us , you may choose to seek medical care at your doctor's office, retail clinic, urgent care center, or emergency room.  If you have a medical emergency, please immediately call 911 or go to the emergency department.  Pager Numbers  - Dr. Hester: [phone]  - Dr. Jackquline: [phone]  - Dr. Claudene: [phone]   - Dr. Raymund: [phone]  In the event of inclement weather, please call our main line at [phone] for an update on the status of any delays or closures.  Dermatology Medication Tips: Please keep the boxes that topical medications come in in order to help keep track of the instructions about where and how to use these. Pharmacies typically print the medication instructions only on the boxes and not directly on the medication tubes.   If your medication is too expensive, please contact our office at [phone] option 4 or send us  a message through MyChart.   We are unable to tell what your co-pay for medications will be in advance as this is different depending on your insurance coverage. However, we may be able to find a substitute medication at lower cost or fill out paperwork to get insurance to cover a needed medication.   If a prior authorization is required to get your medication covered by your insurance company, please allow us  1-2 business days to complete this process.  Drug prices often vary depending on where the prescription is filled and some pharmacies may  offer cheaper prices.  The website www.goodrx.com contains coupons for medications through different pharmacies. The prices here do not account for what the cost may be with help from insurance (it may be cheaper with your insurance), but the website can give you the price if you did not use any insurance.  - You can print the associated coupon and take it with your prescription to the pharmacy.  - You may also stop by our office during regular business hours and pick up a GoodRx coupon card.  - If you need your prescription sent electronically to a different pharmacy, notify our office through [facility] or by phone at [phone] option 4.     Si Usted Necesita Algo Despus de Su Visita  Tambin puede enviarnos un mensaje a travs de Clinical cytogeneticist. Por lo general respondemos a los mensajes de MyChart en el transcurso de 1 a 2 das hbiles.  Para renovar recetas, por favor pida a su  farmacia que se ponga en contacto con nuestra oficina. Randi lakes de fax es [place] [phone].  Si tiene un asunto urgente cuando la clnica est cerrada y que no puede esperar hasta el siguiente da hbil, puede llamar/localizar a su doctor(a) al nmero que aparece a continuacin.   Por favor, tenga en cuenta que aunque hacemos todo lo posible para estar disponibles para asuntos urgentes fuera del horario de [place], no estamos disponibles las 24 horas del da, los 7 [address]  [address] de la [place].   Si tiene un problema urgente y no puede comunicarse con nosotros, puede optar por buscar atencin mdica  en el consultorio de su doctor(a), en una clnica privada, en un centro de atencin urgente o en una sala de emergencias.  Si tiene Engineer, drilling, por favor llame inmediatamente al 911 o vaya a la sala de emergencias.  Nmeros de bper  - Dr. Hester: [phone]  - Dra. Jackquline: [phone]  - Dr. Claudene: [phone]  - Dra. Kitts: [phone]  En caso de inclemencias del [place], por favor llame  a nuestra lnea principal al [phone] para una actualizacin sobre el estado de cualquier retraso o cierre.  Consejos para la medicacin en dermatologa: Por favor, guarde las cajas en las que vienen los medicamentos de uso tpico para ayudarle a seguir las instrucciones sobre dnde y cmo usarlos. Las farmacias generalmente imprimen las instrucciones del medicamento slo en las cajas y no directamente en los tubos del [place].   Si su medicamento es muy caro, por favor, pngase en contacto con landry rieger llamando al [phone] y presione la opcin 4 o envenos un mensaje a travs de Clinical cytogeneticist.   No podemos decirle cul ser su copago por los medicamentos por adelantado ya que esto es diferente dependiendo de la cobertura de su seguro. Sin embargo, es posible que podamos encontrar un medicamento sustituto a Audiological scientist un formulario para que el seguro cubra el medicamento que se considera necesario.   Si se requiere una autorizacin previa para que su compaa de seguros malta su medicamento, por favor permtanos de 1 a 2 das hbiles para completar este proceso.  Los precios de los medicamentos varan con frecuencia dependiendo del Environmental consultant de dnde se surte la receta y alguna farmacias pueden ofrecer precios ms baratos.  El sitio web www.goodrx.com tiene cupones para medicamentos de Health and safety inspector. Los precios aqu no tienen en cuenta lo que podra costar con la ayuda del seguro (puede ser ms barato con su seguro), pero el sitio web puede darle el precio si no utiliz Tourist information centre manager.  - Puede imprimir el cupn correspondiente y llevarlo con su receta a la farmacia.  - Tambin puede pasar por nuestra oficina durante el horario de atencin regular y Education officer, museum una tarjeta de cupones de GoodRx.  - Si necesita que su receta se enve electrnicamente a una farmacia diferente, informe a nuestra oficina a travs de MyChart de [place] o por telfono llamando al [phone] y  presione la opcin 4.

## 2024-10-24 ENCOUNTER — Other Ambulatory Visit: Payer: Self-pay | Admitting: Family

## 2024-10-24 DIAGNOSIS — F39 Unspecified mood [affective] disorder: Secondary | ICD-10-CM

## 2024-11-08 ENCOUNTER — Encounter: Payer: Self-pay | Admitting: Cardiovascular Disease

## 2024-11-08 ENCOUNTER — Ambulatory Visit: Attending: Cardiovascular Disease | Admitting: Cardiovascular Disease

## 2024-11-08 VITALS — BP 120/80 | HR 58 | Ht 67.5 in | Wt 135.1 lb

## 2024-11-08 DIAGNOSIS — R002 Palpitations: Secondary | ICD-10-CM | POA: Diagnosis not present

## 2024-11-08 DIAGNOSIS — I10 Essential (primary) hypertension: Secondary | ICD-10-CM

## 2024-11-08 DIAGNOSIS — I4719 Other supraventricular tachycardia: Secondary | ICD-10-CM | POA: Diagnosis not present

## 2024-11-08 DIAGNOSIS — I700 Atherosclerosis of aorta: Secondary | ICD-10-CM | POA: Diagnosis not present

## 2024-11-08 DIAGNOSIS — I340 Nonrheumatic mitral (valve) insufficiency: Secondary | ICD-10-CM | POA: Diagnosis not present

## 2024-11-08 DIAGNOSIS — E782 Mixed hyperlipidemia: Secondary | ICD-10-CM | POA: Diagnosis not present

## 2024-11-08 DIAGNOSIS — I471 Supraventricular tachycardia, unspecified: Secondary | ICD-10-CM | POA: Diagnosis not present

## 2024-11-08 MED ORDER — METOPROLOL SUCCINATE ER 25 MG PO TB24: 12.5000 mg | ORAL_TABLET | Freq: Every day | ORAL | 3 refills | Status: AC

## 2024-11-08 NOTE — Progress Notes (Signed)
 Cardiology Office Note  Date:  11/08/2024   ID:  Jovanni, Eckhart [birth year], MRN [id number]  PCP:  Dineen Rollene MATSU, FNP   Chief Complaint  Patient presents with   12 month follow up     Doing well.     HPI:  Susan [name] is a 73 y.o. female with past medical history of Mild to moderate mitral valve regurgitation Nonsmoker Nondiabetic coronary CT for calcium scoring, revealing a calcium score of 0. History of nonsustained VT 9 beats on event monitor 10 runs of atrial tachycardia/supraventricular tachycardia on event monitor Who presents for follow-up of her arrhythmia  LOV 5/24 Lives at [facility] husband with cognitive issues, neuropathy Causing significant stress  she walks dog Active  Denies significant tachypalpitations Tolerating metoprolol  succinate 12.5 daily Did not tolerate propranolol  for tremor, was weak Changed back to metoprolol   No significant shortness of breath or chest pain on exertion  Prior monitor/zio,   tachycardia lasting less than 1 minute no more than 1 episode per day  EKG personally reviewed by myself on todays visit EKG Interpretation Date/Time:  Friday November 08 2024 09:22:21 EST Ventricular Rate:  58 PR Interval:  172 QRS Duration:  92 QT Interval:  412 QTC Calculation: 404 R Axis:   -18  Text Interpretation: Sinus bradycardia Possible Left atrial enlargement Minimal voltage criteria for LVH, may be normal variant ( Cornell product When compared with ECG of 25-Jun-2018 12:11, No significant change was found Confirmed by Perla Lye [phone]) on 11/08/2024 9:35:44 AM    Other past medical history reviewed Last seen in clinic November 2019 Breast CA XRT finished sept 6th 2019 Left breast lumpectomy defect, right breast asymmetry Underwent breast surgery January 2021  echocardiogram April 2019 Mild to moderate MR April 2019   Long-term monitor 04/25/2017: Overall rhythm was sinus.   One run of slightly wide  complex tachycardia, 8 beats at average of 179 BPM.    10 episodes of supraventricular tachycardia. Fastest was 16 seconds at 148 BPM. The longest was 42.8 seconds at 138 BPM. One was with a documented symptom. No evidence of significant pauses or AV block, or atrial fibrillation.   PMH:   has a past medical history of Actinic keratosis, Arthritis, Atherosclerosis of aorta, Atrial tachycardia, Basal cell carcinoma (05/26/2016), Basal cell carcinoma (01/21/2015), Breast cancer (HCC), Cancer (HCC), Complication of anesthesia, Dysrhythmia, Essential tremor, Family history of breast cancer, Gastric reflux (90's), GERD (gastroesophageal reflux disease), Grade II diastolic dysfunction, Headache, Hiatal hernia (2003), History of Coronary CT for Calcium Scoring, History of ductal carcinoma in situ (DCIS) of left breast, Hypertension, Lichen sclerosus et atrophicus, Mitral regurgitation, Moderate mitral regurgitation by prior echocardiogram, Mood disorder, Personal history of radiation therapy (2019), PONV (postoperative nausea and vomiting), PSVT (paroxysmal supraventricular tachycardia), Squamous cell carcinoma of skin (10/31/2018), and Tremor of both hands.  PSH:    Past Surgical History:  Procedure Laterality Date   AUGMENTATION MAMMAPLASTY Bilateral 01/17/2020   BREAST BIOPSY Left 06/13/2018   Affirm Bx- coil clip   atypical sclerosing and DCIS   BREAST ENHANCEMENT SURGERY Bilateral 01/17/2020   Procedure: MAMMOPLASTY AUGMENTATION WITH PROSTHESIS (BREAST);  Surgeon: Elisabeth Craig RAMAN, MD;  Location: [facility];  Service: Plastics;  Laterality: Bilateral;  2.5 hours for total case   BREAST EXCISIONAL BIOPSY Right 2000   benign   BREAST LUMPECTOMY WITH NEEDLE LOCALIZATION Left 06/26/2018   Procedure: BREAST LUMPECTOMY WITH NEEDLE LOCALIZATION;  Surgeon: Jordis Laneta FALCON, MD;  Location: [facility];  Service:  General;  Laterality: Left;   CATARACT EXTRACTION W/PHACO Left 05/29/2024   Procedure:  PHACOEMULSIFICATION, CATARACT, WITH IOL INSERTION 5.56 00:34.3;  Surgeon: Mittie Gaskin, MD;  Location: [facility];  Service: Ophthalmology;  Laterality: Left;   CATARACT EXTRACTION W/PHACO Right 06/12/2024   Procedure: PHACOEMULSIFICATION, CATARACT, WITH IOL INSERTION 6.37 00:39.2;  Surgeon: Mittie Gaskin, MD;  Location: [facility];  Service: Ophthalmology;  Laterality: Right;   CHOLECYSTECTOMY     OVARIAN CYST SURGERY     SCAR REVISION Left 01/17/2020   Procedure: POSSIBLE SCAR REVISION TO LEFT BREAST;  Surgeon: Elisabeth Craig RAMAN, MD;  Location: [facility];  Service: Plastics;  Laterality: Left;   SKIN FULL THICKNESS GRAFT  01/17/2020   Procedure: SKIN GRAFT FULL THICKNESS;  Surgeon: Elisabeth Craig RAMAN, MD;  Location: [facility];  Service: Plastics;;   TONSILLECTOMY     age 20   UPPER GI ENDOSCOPY  2003    Current Outpatient Medications  Medication Sig Dispense Refill   ALPRAZolam  (XANAX ) 0.5 MG tablet Take 0.5-1 tablets (0.25-0.5 mg total) by mouth daily as needed. for sleep 30 tablet 0   calcium citrate-vitamin D  500-500 MG-UNIT chewable tablet Chew 1 tablet by mouth 2 (two) times daily.     cholecalciferol (VITAMIN D ) 1000 units tablet Take 1,000 Units by mouth daily.     citalopram  (CELEXA ) 20 MG tablet TAKE 1 TABLET BY MOUTH EVERY DAY 90 tablet 3   clobetasol  ointment (TEMOVATE ) 0.05 % APPLY TO AFFECTED AREA EVERY NIGHT FOR 4 WEEKS, THEN EVERY OTHER DAY FOR 4 WEEKS AND THEN TWICE A WEEK FOR 4 WEEKS OR UNTIL RESOLUTION. 30 g 5   cyclobenzaprine  (FLEXERIL ) 5 MG tablet Take 1-2 tablets (5-10 mg total) by mouth at bedtime as needed for muscle spasms. 30 tablet 1   famotidine (PEPCID) 20 MG tablet Take 20 mg by mouth as needed for heartburn or indigestion.     fluticasone (FLONASE) 50 MCG/ACT nasal spray Place 2 sprays into both nostrils as needed.      Ivermectin  1 % CREA Apply 1 Application topically at bedtime. qhs to face for  Rosacea 30 g 11   metoprolol  succinate (TOPROL -XL) 25 MG 24 hr tablet TAKE 1/2 TABLET BY MOUTH DAILY 45 tablet 3   Multiple Vitamin (MULTIVITAMIN) tablet Take 1 tablet by mouth daily.     psyllium (REGULOID) 0.52 g capsule Take 0.52 g by mouth 3 (three) times daily.      SIMETHICONE-80 PO Take 80 mg by mouth as needed (gas).      topiramate  (TOPAMAX ) 25 MG tablet Take 1 tablet (25 mg total) by mouth daily. 90 tablet 3   No current facility-administered medications for this visit.    Allergies:   Patient has no known allergies.   Social History:  The patient  reports that she has never smoked. She has never used smokeless tobacco. She reports current alcohol use of about 7.0 standard drinks of alcohol per week. She reports that she does not use drugs.   Family History:   family history includes Breast cancer (age of onset: 29) in her cousin; Breast cancer (age of onset: 88) in her cousin; Breast cancer (age of onset: 34) in her cousin and cousin; COPD in her mother; Healthy in her brother; Heart failure in her mother; Hyperlipidemia in her mother; Hypertension in her brother, father, mother, and sister; Melanoma in her paternal uncle; Stroke (age of onset: 79) in her father; Testicular cancer (age of onset:  37) in an other family member.   Review of Systems: Review of Systems  Constitutional: Negative.   HENT: Negative.    Respiratory: Negative.    Cardiovascular: Negative.   Gastrointestinal: Negative.   Musculoskeletal: Negative.   Neurological: Negative.   Psychiatric/Behavioral: Negative.    All other systems reviewed and are negative.  PHYSICAL EXAM: VS:  BP 120/80 (BP Location: Left Arm, Patient Position: Sitting, Cuff Size: Normal)   Pulse (!) 58   Ht 5' 7.5 (1.715 m)   Wt 135 lb 2 oz (61.3 kg)   SpO2 96%   BMI 20.85 kg/m  , BMI Body mass index is 20.85 kg/m. Constitutional:  oriented to person, place, and time. No distress.  HENT:  Head: Normocephalic and atraumatic.   Eyes:  no discharge. No scleral icterus.  Neck: Normal range of motion. Neck supple. No JVD present.  Cardiovascular: Normal rate, regular rhythm, normal heart sounds and intact distal pulses. Exam reveals no gallop and no friction rub. No edema No murmur heard. Pulmonary/Chest: Effort normal and breath sounds normal. No stridor. No respiratory distress.  no wheezes.  no rales.  no tenderness.  Abdominal: Soft.  no distension.  no tenderness.  Musculoskeletal: Normal range of motion.  no  tenderness or deformity.  Neurological:  normal muscle tone. Coordination normal. No atrophy Skin: Skin is warm and dry. No rash noted. not diaphoretic.  Psychiatric:  normal mood and affect. behavior is normal. Thought content normal.    Recent Labs: 03/21/2024: ALT 16; BUN 13; Creatinine, Ser 0.76; Hemoglobin 13.9; Platelets 271.0; Potassium 4.3; Sodium 136; TSH 2.10    Lipid Panel Lab Results  Component Value Date   CHOL 170 03/21/2024   HDL 70.10 03/21/2024   LDLCALC 87 03/21/2024   TRIG 64.0 03/21/2024      Wt Readings from Last 3 Encounters:  11/08/24 135 lb 2 oz (61.3 kg)  08/21/24 134 lb (60.8 kg)  06/12/24 131 lb 12.8 oz (59.8 kg)     ASSESSMENT AND PLAN:  Essential hypertension Blood pressure is well controlled on today's visit. No changes made to the medications.  Palpitations  Symptoms relatively well-controlled even in the setting of significant stress, husband with dementia, neuropathy Recommend she continue metoprolol  succinate 12.5 mg daily,  extra metoprolol  succinate 12.5 mg as needed for breakthrough tachycardia  Atrial tachycardia (HCC) - Plan: EKG 12-Lead Continue metoprolol  as above, symptoms relatively well-controlled did not tolerate propranolol   Non-rheumatic mitral regurgitation Previously with mild to moderate MR April 2019 No significant murmur on exam, no further workup needed at this time  Run of nonsustained VT on ZIO Continue low-dose metoprolol  No  near-syncope or syncope  Breast cancer with radiation, on left Completed bilateral surgery for cosmetic reasons January 2021  Preventive care CT coronary calcium score  with score of 0    Orders Placed This Encounter  Procedures   EKG 12-Lead     Signed, Velinda Lunger, M.D., Ph.D. 11/08/2024  [facility] [place], [place] [phone]

## 2024-11-08 NOTE — Patient Instructions (Signed)

## 2024-11-28 ENCOUNTER — Encounter: Payer: Self-pay | Admitting: Family

## 2024-11-28 DIAGNOSIS — G250 Essential tremor: Secondary | ICD-10-CM

## 2024-11-28 MED ORDER — TOPIRAMATE 25 MG PO TABS: 25.0000 mg | ORAL_TABLET | Freq: Two times a day (BID) | ORAL | 3 refills | Status: AC

## 2024-11-28 NOTE — Addendum Note (Signed)
 Addended by: ORLANDO KINGDOM on: 11/28/2024 02:21 PM   Modules accepted: Orders

## 2024-12-11 ENCOUNTER — Encounter: Payer: Self-pay | Admitting: Dermatology

## 2024-12-11 ENCOUNTER — Ambulatory Visit (INDEPENDENT_AMBULATORY_CARE_PROVIDER_SITE_OTHER): Payer: Self-pay | Admitting: Dermatology

## 2024-12-11 DIAGNOSIS — I781 Nevus, non-neoplastic: Secondary | ICD-10-CM

## 2024-12-11 DIAGNOSIS — L578 Other skin changes due to chronic exposure to nonionizing radiation: Secondary | ICD-10-CM

## 2024-12-11 DIAGNOSIS — L814 Other melanin hyperpigmentation: Secondary | ICD-10-CM

## 2024-12-11 NOTE — Progress Notes (Signed)
 Follow-Up Visit   Subjective  Susan [name] is a 73 y.o. female who presents for the following: patient here for 1st BBL treatment to treat rosacea at cheeks and nose   The following portions of the chart were reviewed this encounter and updated as appropriate: medications, allergies, medical history  Review of Systems:  No other skin or systemic complaints except as noted in HPI or Assessment and Plan.  Objective  Well appearing patient in no apparent distress; mood and affect are within normal limits.   A focused examination was performed of the following areas: Face   Relevant exam findings are noted in the Assessment and Plan.  Before Photos BBL telangectasia at nose, cheeks and chin   Photos browns and telangectasia at left cheek           Telangiectasia  setting at cheeks, nose and chin   Settings for Browns at cheeks        Assessment & Plan   TELANGIECTASIA Exam: dilated blood vessel(s) cheeks, chin and nose   Treatment Plan: BBL today  Discussed may take more than one treatment. Areas could return  Laser safety: Patient was advised in laser safety.  Patient was fitted with laser safety goggles and advised to keep eyes closed during procedure with goggles on. Staff and provider ensured that patient and their own safety goggles were also on and eyes protected during procedure. Laser room door was secured and locked from the inside. Laser room door has laser safety sign affixed to the outside of the door.   Patient given BBL Starter Kit and cooling gel mask at today's visit    [place] BBL - 12/11/24 1400      Patient Details   Skin Type: I    Anesthestic Cream Applied: No    Photo Takes: Yes    Consent Signed: Yes      Treatment Details   Date: 12/11/24    Treatment #: 1    Area: face   cheeks, chin, nose   Filter: 1st Pass;2nd Pass      1st Pass   Location: F    Device: 560   vascular - telangectasia at nose, cheeks and chin    BBL j/cm2: 26    PW Msec Sec: 27    Target Temp: 20    Pulses: 54    7mm: this one     Patient tolerated the procedure well.   Austin avoidance was stressed. The patient will call with any problems, questions or concerns prior to their next appointment.    LENTIGINES/ ACTINIC DAMAGE/EARLY SKS Exam: scattered tan macules cheeks Due to sun exposure Treatment Plan: BBL Treatment today  Patient tolerated the procedure well.   Austin avoidance was stressed. The patient will call with any problems, questions or concerns prior to their next appointment.   [place] BBL - 12/11/24 1400      Patient Details   Skin Type: I    Anesthestic Cream Applied: No    Photo Takes: Yes    Consent Signed: Yes      Treatment Details   Date: 12/11/24    Treatment #: 1    Area: face   cheeks, chin, nose   Filter: 1st Pass;2nd Pass        2nd Pass   Location: F    Device: 515   pigmented lesion - browns at cheeks   BBL j/cm2: 15    PW Msec Sec: 15    Target Temp:  15    Pulses: 46    11mm: this one     Laser safety: Patient was advised in laser safety.  Patient was fitted with laser safety goggles and advised to keep eyes closed during procedure with goggles on. Staff and provider ensured that patient and their own safety goggles were also on and eyes protected during procedure. Laser room door was secured and locked from the inside. Laser room door has laser safety sign affixed to the outside of the door.     ACTINIC DAMAGE - chronic, secondary to cumulative UV radiation exposure/sun exposure over time - diffuse scaly erythematous macules with underlying dyspigmentation - Recommend daily broad spectrum sunscreen SPF 30+ to sun-exposed areas, reapply every 2 hours as needed.  - Recommend staying in the shade or wearing long sleeves, sun glasses (UVA+UVB protection) and wide brim hats (4-inch brim around the entire circumference of the hat). - Call for new or changing lesions.    Return for  schedule BBL in february .  IEleanor Blush, CMA, am acting as scribe for Alm Rhyme, MD.   Documentation: I have reviewed the above documentation for accuracy and completeness, and I agree with the above.  Alm Rhyme, MD

## 2024-12-11 NOTE — Patient Instructions (Signed)

## 2024-12-16 ENCOUNTER — Other Ambulatory Visit: Payer: Self-pay | Admitting: Obstetrics and Gynecology

## 2024-12-16 DIAGNOSIS — L900 Lichen sclerosus et atrophicus: Secondary | ICD-10-CM

## 2024-12-30 ENCOUNTER — Telehealth: Payer: Self-pay | Admitting: Oncology

## 2024-12-30 ENCOUNTER — Encounter: Payer: Self-pay | Admitting: Obstetrics & Gynecology

## 2024-12-30 NOTE — Telephone Encounter (Signed)
 Patient called to cancel her appointment for 1/13. She stated Dr. Melanee gave her the option of continuing to see her or just follow up with PCP and she would like to continue with PCP.  Appointment cancelled as requested.

## 2025-01-07 ENCOUNTER — Ambulatory Visit: Payer: PPO | Admitting: Oncology

## 2025-02-13 ENCOUNTER — Ambulatory Visit: Admitting: Dermatology

## 2025-03-06 ENCOUNTER — Encounter: Admitting: Family

## 2025-03-18 ENCOUNTER — Ambulatory Visit

## 2025-09-18 ENCOUNTER — Ambulatory Visit: Admitting: Dermatology

## 2025-12-03 ENCOUNTER — Ambulatory Visit: Admitting: Dermatology
